# Patient Record
Sex: Female | Born: 1945 | Race: Black or African American | Hispanic: No | Marital: Married | Smoking: Never smoker
Health system: Southern US, Community
[De-identification: ages and names within clinical notes are randomized; demographics above are authoritative.]

## PROBLEM LIST (undated history)

## (undated) ENCOUNTER — Emergency Department (HOSPITAL_COMMUNITY): Payer: Medicare Other | Source: Home / Self Care

## (undated) DIAGNOSIS — G8929 Other chronic pain: Secondary | ICD-10-CM

## (undated) DIAGNOSIS — R519 Headache, unspecified: Secondary | ICD-10-CM

## (undated) DIAGNOSIS — Z9889 Other specified postprocedural states: Secondary | ICD-10-CM

## (undated) DIAGNOSIS — T41205A Adverse effect of unspecified general anesthetics, initial encounter: Secondary | ICD-10-CM

## (undated) DIAGNOSIS — C801 Malignant (primary) neoplasm, unspecified: Secondary | ICD-10-CM

## (undated) DIAGNOSIS — M1990 Unspecified osteoarthritis, unspecified site: Secondary | ICD-10-CM

## (undated) DIAGNOSIS — R112 Nausea with vomiting, unspecified: Secondary | ICD-10-CM

## (undated) DIAGNOSIS — N390 Urinary tract infection, site not specified: Secondary | ICD-10-CM

## (undated) DIAGNOSIS — F419 Anxiety disorder, unspecified: Secondary | ICD-10-CM

## (undated) DIAGNOSIS — I10 Essential (primary) hypertension: Secondary | ICD-10-CM

## (undated) DIAGNOSIS — K219 Gastro-esophageal reflux disease without esophagitis: Secondary | ICD-10-CM

## (undated) DIAGNOSIS — Z8371 Family history of colonic polyps: Secondary | ICD-10-CM

## (undated) DIAGNOSIS — M549 Dorsalgia, unspecified: Secondary | ICD-10-CM

## (undated) HISTORY — PX: BREAST LUMPECTOMY: SHX2

## (undated) HISTORY — PX: COLONOSCOPY: SHX174

---

## 1978-11-11 HISTORY — PX: TUBAL LIGATION: SHX77

## 2002-11-11 HISTORY — PX: RETINAL DETACHMENT SURGERY: SHX105

## 2002-11-11 HISTORY — PX: EYE SURGERY: SHX253

## 2003-11-12 HISTORY — PX: OTHER SURGICAL HISTORY: SHX169

## 2003-11-12 HISTORY — PX: MASTECTOMY: SHX3

## 2004-04-11 DIAGNOSIS — C50911 Malignant neoplasm of unspecified site of right female breast: Secondary | ICD-10-CM

## 2004-04-11 HISTORY — DX: Malignant neoplasm of unspecified site of right female breast: C50.911

## 2007-11-12 HISTORY — PX: OTHER SURGICAL HISTORY: SHX169

## 2008-11-16 ENCOUNTER — Ambulatory Visit: Payer: Self-pay | Admitting: Hematology and Oncology

## 2008-11-23 LAB — CANCER ANTIGEN 27.29: CA 27.29: 22 U/mL (ref 0–39)

## 2008-11-23 LAB — COMPREHENSIVE METABOLIC PANEL
ALT: 18 U/L (ref 0–35)
AST: 20 U/L (ref 0–37)
Albumin: 4.4 g/dL (ref 3.5–5.2)
Alkaline Phosphatase: 66 U/L (ref 39–117)
BUN: 18 mg/dL (ref 6–23)
CO2: 26 mEq/L (ref 19–32)
Calcium: 9.7 mg/dL (ref 8.4–10.5)
Chloride: 103 mEq/L (ref 96–112)
Creatinine, Ser: 0.75 mg/dL (ref 0.40–1.20)
Glucose, Bld: 77 mg/dL (ref 70–99)
Potassium: 3.6 mEq/L (ref 3.5–5.3)
Sodium: 138 mEq/L (ref 135–145)
Total Bilirubin: 0.7 mg/dL (ref 0.3–1.2)
Total Protein: 7.4 g/dL (ref 6.0–8.3)

## 2008-11-23 LAB — CBC WITH DIFFERENTIAL/PLATELET
BASO%: 0.4 % (ref 0.0–2.0)
Basophils Absolute: 0 10*3/uL (ref 0.0–0.1)
EOS%: 1.8 % (ref 0.0–7.0)
Eosinophils Absolute: 0.1 10*3/uL (ref 0.0–0.5)
HCT: 44.2 % (ref 34.8–46.6)
HGB: 15.2 g/dL (ref 11.6–15.9)
LYMPH%: 38.4 % (ref 14.0–48.0)
MCH: 32.8 pg (ref 26.0–34.0)
MCHC: 34.4 g/dL (ref 32.0–36.0)
MCV: 95.3 fL (ref 81.0–101.0)
MONO#: 0.4 10*3/uL (ref 0.1–0.9)
MONO%: 7.6 % (ref 0.0–13.0)
NEUT#: 2.4 10*3/uL (ref 1.5–6.5)
NEUT%: 51.8 % (ref 39.6–76.8)
Platelets: 199 10*3/uL (ref 145–400)
RBC: 4.64 10*6/uL (ref 3.70–5.32)
RDW: 13.6 % (ref 11.3–14.5)
WBC: 4.6 10*3/uL (ref 3.9–10.0)
lymph#: 1.8 10*3/uL (ref 0.9–3.3)

## 2008-11-23 LAB — LACTATE DEHYDROGENASE: LDH: 154 U/L (ref 94–250)

## 2008-11-24 LAB — VITAMIN D 25 HYDROXY (VIT D DEFICIENCY, FRACTURES): Vit D, 25-Hydroxy: 37 ng/mL (ref 30–89)

## 2008-11-24 LAB — TSH: TSH: 0.661 u[IU]/mL (ref 0.350–4.500)

## 2008-11-24 LAB — T4, FREE: Free T4: 1.12 ng/dL (ref 0.89–1.80)

## 2008-12-08 ENCOUNTER — Encounter: Admission: RE | Admit: 2008-12-08 | Discharge: 2008-12-08 | Payer: Self-pay | Admitting: Hematology and Oncology

## 2008-12-09 ENCOUNTER — Encounter: Admission: RE | Admit: 2008-12-09 | Discharge: 2008-12-09 | Payer: Self-pay | Admitting: Hematology and Oncology

## 2009-05-11 ENCOUNTER — Ambulatory Visit: Payer: Self-pay | Admitting: Hematology and Oncology

## 2009-05-11 LAB — CBC WITH DIFFERENTIAL/PLATELET
BASO%: 0.5 % (ref 0.0–2.0)
Basophils Absolute: 0 10*3/uL (ref 0.0–0.1)
EOS%: 0.7 % (ref 0.0–7.0)
Eosinophils Absolute: 0 10*3/uL (ref 0.0–0.5)
HCT: 42.7 % (ref 34.8–46.6)
HGB: 14.8 g/dL (ref 11.6–15.9)
LYMPH%: 39.4 % (ref 14.0–49.7)
MCH: 33.1 pg (ref 25.1–34.0)
MCHC: 34.7 g/dL (ref 31.5–36.0)
MCV: 95.5 fL (ref 79.5–101.0)
MONO#: 0.3 10*3/uL (ref 0.1–0.9)
MONO%: 8.5 % (ref 0.0–14.0)
NEUT#: 1.9 10*3/uL (ref 1.5–6.5)
NEUT%: 50.9 % (ref 38.4–76.8)
Platelets: 193 10*3/uL (ref 145–400)
RBC: 4.47 10*6/uL (ref 3.70–5.45)
RDW: 14.3 % (ref 11.2–14.5)
WBC: 3.8 10*3/uL — ABNORMAL LOW (ref 3.9–10.3)
lymph#: 1.5 10*3/uL (ref 0.9–3.3)

## 2009-05-11 LAB — COMPREHENSIVE METABOLIC PANEL
ALT: 18 U/L (ref 0–35)
AST: 22 U/L (ref 0–37)
Albumin: 4.2 g/dL (ref 3.5–5.2)
Alkaline Phosphatase: 45 U/L (ref 39–117)
BUN: 15 mg/dL (ref 6–23)
CO2: 26 mEq/L (ref 19–32)
Calcium: 10.6 mg/dL — ABNORMAL HIGH (ref 8.4–10.5)
Chloride: 102 mEq/L (ref 96–112)
Creatinine, Ser: 0.84 mg/dL (ref 0.40–1.20)
Glucose, Bld: 74 mg/dL (ref 70–99)
Potassium: 4.1 mEq/L (ref 3.5–5.3)
Sodium: 138 mEq/L (ref 135–145)
Total Bilirubin: 0.7 mg/dL (ref 0.3–1.2)
Total Protein: 7.1 g/dL (ref 6.0–8.3)

## 2009-05-11 LAB — LACTATE DEHYDROGENASE: LDH: 152 U/L (ref 94–250)

## 2009-05-11 LAB — CANCER ANTIGEN 27.29: CA 27.29: 22 U/mL (ref 0–39)

## 2009-08-24 ENCOUNTER — Ambulatory Visit (HOSPITAL_COMMUNITY): Admission: RE | Admit: 2009-08-24 | Discharge: 2009-08-24 | Payer: Self-pay | Admitting: Gastroenterology

## 2009-08-24 ENCOUNTER — Encounter (INDEPENDENT_AMBULATORY_CARE_PROVIDER_SITE_OTHER): Payer: Self-pay | Admitting: Gastroenterology

## 2009-11-11 HISTORY — PX: ANKLE SURGERY: SHX546

## 2009-12-07 ENCOUNTER — Ambulatory Visit: Payer: Self-pay | Admitting: Hematology and Oncology

## 2009-12-11 ENCOUNTER — Encounter: Admission: RE | Admit: 2009-12-11 | Discharge: 2009-12-11 | Payer: Self-pay | Admitting: Hematology and Oncology

## 2009-12-11 LAB — COMPREHENSIVE METABOLIC PANEL
ALT: 16 U/L (ref 0–35)
AST: 21 U/L (ref 0–37)
Albumin: 4.3 g/dL (ref 3.5–5.2)
Alkaline Phosphatase: 52 U/L (ref 39–117)
BUN: 13 mg/dL (ref 6–23)
CO2: 27 mEq/L (ref 19–32)
Calcium: 10 mg/dL (ref 8.4–10.5)
Chloride: 105 mEq/L (ref 96–112)
Creatinine, Ser: 0.73 mg/dL (ref 0.40–1.20)
Glucose, Bld: 78 mg/dL (ref 70–99)
Potassium: 4.1 mEq/L (ref 3.5–5.3)
Sodium: 142 mEq/L (ref 135–145)
Total Bilirubin: 0.6 mg/dL (ref 0.3–1.2)
Total Protein: 7.3 g/dL (ref 6.0–8.3)

## 2009-12-11 LAB — CBC WITH DIFFERENTIAL/PLATELET
BASO%: 0.5 % (ref 0.0–2.0)
Basophils Absolute: 0 10*3/uL (ref 0.0–0.1)
EOS%: 0.9 % (ref 0.0–7.0)
Eosinophils Absolute: 0 10*3/uL (ref 0.0–0.5)
HCT: 44.8 % (ref 34.8–46.6)
HGB: 15.4 g/dL (ref 11.6–15.9)
LYMPH%: 30.8 % (ref 14.0–49.7)
MCH: 33.7 pg (ref 25.1–34.0)
MCHC: 34.3 g/dL (ref 31.5–36.0)
MCV: 98.4 fL (ref 79.5–101.0)
MONO#: 0.4 10*3/uL (ref 0.1–0.9)
MONO%: 9 % (ref 0.0–14.0)
NEUT#: 2.6 10*3/uL (ref 1.5–6.5)
NEUT%: 58.8 % (ref 38.4–76.8)
Platelets: 195 10*3/uL (ref 145–400)
RBC: 4.55 10*6/uL (ref 3.70–5.45)
RDW: 14.1 % (ref 11.2–14.5)
WBC: 4.4 10*3/uL (ref 3.9–10.3)
lymph#: 1.3 10*3/uL (ref 0.9–3.3)

## 2009-12-11 LAB — CANCER ANTIGEN 27.29: CA 27.29: 26 U/mL (ref 0–39)

## 2009-12-11 LAB — LACTATE DEHYDROGENASE: LDH: 154 U/L (ref 94–250)

## 2009-12-14 ENCOUNTER — Ambulatory Visit (HOSPITAL_COMMUNITY): Admission: RE | Admit: 2009-12-14 | Discharge: 2009-12-14 | Payer: Self-pay | Admitting: Hematology and Oncology

## 2009-12-22 ENCOUNTER — Encounter: Admission: RE | Admit: 2009-12-22 | Discharge: 2009-12-22 | Payer: Self-pay | Admitting: Hematology and Oncology

## 2010-06-28 ENCOUNTER — Inpatient Hospital Stay (HOSPITAL_COMMUNITY): Admission: EM | Admit: 2010-06-28 | Discharge: 2010-06-30 | Payer: Self-pay | Admitting: Emergency Medicine

## 2010-08-16 ENCOUNTER — Encounter
Admission: RE | Admit: 2010-08-16 | Discharge: 2010-10-31 | Payer: Self-pay | Source: Home / Self Care | Attending: Orthopedic Surgery | Admitting: Orthopedic Surgery

## 2010-08-24 ENCOUNTER — Ambulatory Visit: Payer: Self-pay | Admitting: Hematology and Oncology

## 2010-08-28 LAB — COMPREHENSIVE METABOLIC PANEL
ALT: 13 U/L (ref 0–35)
AST: 18 U/L (ref 0–37)
Albumin: 4.3 g/dL (ref 3.5–5.2)
Alkaline Phosphatase: 79 U/L (ref 39–117)
BUN: 17 mg/dL (ref 6–23)
CO2: 28 mEq/L (ref 19–32)
Calcium: 10 mg/dL (ref 8.4–10.5)
Chloride: 104 mEq/L (ref 96–112)
Creatinine, Ser: 0.72 mg/dL (ref 0.40–1.20)
Glucose, Bld: 70 mg/dL (ref 70–99)
Potassium: 4.5 mEq/L (ref 3.5–5.3)
Sodium: 140 mEq/L (ref 135–145)
Total Bilirubin: 0.4 mg/dL (ref 0.3–1.2)
Total Protein: 7.5 g/dL (ref 6.0–8.3)

## 2010-08-28 LAB — CBC WITH DIFFERENTIAL/PLATELET
BASO%: 0.6 % (ref 0.0–2.0)
Basophils Absolute: 0 10*3/uL (ref 0.0–0.1)
EOS%: 1.2 % (ref 0.0–7.0)
Eosinophils Absolute: 0.1 10*3/uL (ref 0.0–0.5)
HCT: 42.9 % (ref 34.8–46.6)
HGB: 14.6 g/dL (ref 11.6–15.9)
LYMPH%: 35.1 % (ref 14.0–49.7)
MCH: 33.6 pg (ref 25.1–34.0)
MCHC: 34.1 g/dL (ref 31.5–36.0)
MCV: 98.4 fL (ref 79.5–101.0)
MONO#: 0.5 10*3/uL (ref 0.1–0.9)
MONO%: 8.4 % (ref 0.0–14.0)
NEUT#: 3.1 10*3/uL (ref 1.5–6.5)
NEUT%: 54.7 % (ref 38.4–76.8)
Platelets: 270 10*3/uL (ref 145–400)
RBC: 4.36 10*6/uL (ref 3.70–5.45)
RDW: 14.6 % — ABNORMAL HIGH (ref 11.2–14.5)
WBC: 5.6 10*3/uL (ref 3.9–10.3)
lymph#: 2 10*3/uL (ref 0.9–3.3)

## 2010-08-28 LAB — CANCER ANTIGEN 27.29: CA 27.29: 29 U/mL (ref 0–39)

## 2010-08-28 LAB — LACTATE DEHYDROGENASE: LDH: 135 U/L (ref 94–250)

## 2010-12-01 ENCOUNTER — Other Ambulatory Visit: Payer: Self-pay | Admitting: Hematology and Oncology

## 2010-12-01 DIAGNOSIS — Z780 Asymptomatic menopausal state: Secondary | ICD-10-CM

## 2010-12-01 DIAGNOSIS — Z1239 Encounter for other screening for malignant neoplasm of breast: Secondary | ICD-10-CM

## 2010-12-01 DIAGNOSIS — Z853 Personal history of malignant neoplasm of breast: Secondary | ICD-10-CM

## 2010-12-25 ENCOUNTER — Ambulatory Visit
Admission: RE | Admit: 2010-12-25 | Discharge: 2010-12-25 | Disposition: A | Discharge status: Home / Self Care | Payer: PRIVATE HEALTH INSURANCE | Source: Ambulatory Visit | Attending: Hematology and Oncology | Admitting: Hematology and Oncology

## 2010-12-25 ENCOUNTER — Ambulatory Visit
Admission: RE | Admit: 2010-12-25 | Discharge: 2010-12-25 | Disposition: A | Discharge status: Home / Self Care | Payer: PRIVATE HEALTH INSURANCE | Source: Ambulatory Visit

## 2010-12-25 DIAGNOSIS — Z1239 Encounter for other screening for malignant neoplasm of breast: Secondary | ICD-10-CM

## 2010-12-25 DIAGNOSIS — Z780 Asymptomatic menopausal state: Secondary | ICD-10-CM

## 2010-12-25 DIAGNOSIS — Z853 Personal history of malignant neoplasm of breast: Secondary | ICD-10-CM

## 2011-01-25 LAB — CBC
HCT: 43.4 % (ref 36.0–46.0)
Hemoglobin: 15 g/dL (ref 12.0–15.0)
MCH: 32.7 pg (ref 26.0–34.0)
MCHC: 34.6 g/dL (ref 30.0–36.0)
MCV: 94.6 fL (ref 78.0–100.0)
Platelets: 187 10*3/uL (ref 150–400)
RBC: 4.59 MIL/uL (ref 3.87–5.11)
RDW: 13.8 % (ref 11.5–15.5)
WBC: 9.5 10*3/uL (ref 4.0–10.5)

## 2011-01-25 LAB — COMPREHENSIVE METABOLIC PANEL
ALT: 22 U/L (ref 0–35)
AST: 28 U/L (ref 0–37)
Albumin: 3.7 g/dL (ref 3.5–5.2)
Alkaline Phosphatase: 56 U/L (ref 39–117)
BUN: 14 mg/dL (ref 6–23)
CO2: 27 mEq/L (ref 19–32)
Calcium: 9.3 mg/dL (ref 8.4–10.5)
Chloride: 107 mEq/L (ref 96–112)
Creatinine, Ser: 0.72 mg/dL (ref 0.4–1.2)
GFR calc Af Amer: >60 mL/min (ref >60)
GFR calc non Af Amer: >60 mL/min (ref >60)
Glucose, Bld: 116 mg/dL — ABNORMAL HIGH (ref 70–99)
Potassium: 3.9 mEq/L (ref 3.5–5.1)
Sodium: 138 mEq/L (ref 135–145)
Total Bilirubin: 0.8 mg/dL (ref 0.3–1.2)
Total Protein: 7.1 g/dL (ref 6.0–8.3)

## 2011-01-25 LAB — DIFFERENTIAL
Basophils Absolute: 0 10*3/uL (ref 0.0–0.1)
Basophils Relative: 0 % (ref 0–1)
Eosinophils Absolute: 0 10*3/uL (ref 0.0–0.7)
Eosinophils Relative: 0 % (ref 0–5)
Lymphocytes Relative: 14 % (ref 12–46)
Lymphs Abs: 1.3 10*3/uL (ref 0.7–4.0)
Monocytes Absolute: 0.6 10*3/uL (ref 0.1–1.0)
Monocytes Relative: 7 % (ref 3–12)
Neutro Abs: 7.5 10*3/uL (ref 1.7–7.7)
Neutrophils Relative %: 79 % — ABNORMAL HIGH (ref 43–77)

## 2011-01-25 LAB — APTT: aPTT: 22 seconds — ABNORMAL LOW (ref 24–37)

## 2011-01-25 LAB — PROTIME-INR
INR: 0.98 (ref 0.00–1.49)
Prothrombin Time: 13.2 seconds (ref 11.6–15.2)

## 2011-02-25 ENCOUNTER — Other Ambulatory Visit: Payer: Self-pay | Admitting: *Deleted

## 2011-02-25 DIAGNOSIS — M5416 Radiculopathy, lumbar region: Secondary | ICD-10-CM

## 2011-02-26 ENCOUNTER — Ambulatory Visit
Admission: RE | Admit: 2011-02-26 | Discharge: 2011-02-26 | Disposition: A | Discharge status: Home / Self Care | Payer: PRIVATE HEALTH INSURANCE | Source: Ambulatory Visit | Attending: *Deleted | Admitting: *Deleted

## 2011-02-26 DIAGNOSIS — M5416 Radiculopathy, lumbar region: Secondary | ICD-10-CM

## 2011-02-26 MED ORDER — GADOBENATE DIMEGLUMINE 529 MG/ML IV SOLN
13.0000 mL | Freq: Once | INTRAVENOUS | Status: AC | PRN
  Administered 2011-02-26: 13 mL via INTRAVENOUS

## 2011-03-14 ENCOUNTER — Other Ambulatory Visit: Payer: Self-pay | Admitting: Neurosurgery

## 2011-03-14 DIAGNOSIS — M7130 Other bursal cyst, unspecified site: Secondary | ICD-10-CM

## 2011-03-18 ENCOUNTER — Ambulatory Visit
Admission: RE | Admit: 2011-03-18 | Discharge: 2011-03-18 | Disposition: A | Discharge status: Home / Self Care | Payer: PRIVATE HEALTH INSURANCE | Source: Ambulatory Visit | Attending: Neurosurgery | Admitting: Neurosurgery

## 2011-03-18 ENCOUNTER — Other Ambulatory Visit: Payer: PRIVATE HEALTH INSURANCE

## 2011-03-18 DIAGNOSIS — M7130 Other bursal cyst, unspecified site: Secondary | ICD-10-CM

## 2011-04-23 ENCOUNTER — Ambulatory Visit: Payer: PRIVATE HEALTH INSURANCE

## 2011-04-25 ENCOUNTER — Other Ambulatory Visit: Payer: Self-pay | Admitting: Hematology and Oncology

## 2011-04-25 DIAGNOSIS — Z780 Asymptomatic menopausal state: Secondary | ICD-10-CM

## 2011-04-25 DIAGNOSIS — Z853 Personal history of malignant neoplasm of breast: Secondary | ICD-10-CM

## 2011-04-29 ENCOUNTER — Ambulatory Visit: Payer: PRIVATE HEALTH INSURANCE | Attending: Internal Medicine

## 2011-04-29 DIAGNOSIS — IMO0001 Reserved for inherently not codable concepts without codable children: Secondary | ICD-10-CM | POA: Insufficient documentation

## 2011-04-29 DIAGNOSIS — M545 Low back pain, unspecified: Secondary | ICD-10-CM | POA: Insufficient documentation

## 2011-04-29 DIAGNOSIS — R5381 Other malaise: Secondary | ICD-10-CM | POA: Insufficient documentation

## 2011-04-29 DIAGNOSIS — M79609 Pain in unspecified limb: Secondary | ICD-10-CM | POA: Insufficient documentation

## 2011-05-03 ENCOUNTER — Ambulatory Visit: Payer: PRIVATE HEALTH INSURANCE

## 2011-05-06 ENCOUNTER — Ambulatory Visit: Payer: PRIVATE HEALTH INSURANCE | Admitting: Physical Therapy

## 2011-05-08 ENCOUNTER — Other Ambulatory Visit: Payer: Self-pay | Admitting: Neurosurgery

## 2011-05-08 ENCOUNTER — Ambulatory Visit: Payer: PRIVATE HEALTH INSURANCE | Admitting: Physical Therapy

## 2011-05-08 DIAGNOSIS — M7130 Other bursal cyst, unspecified site: Secondary | ICD-10-CM

## 2011-05-09 ENCOUNTER — Ambulatory Visit
Admission: RE | Admit: 2011-05-09 | Discharge: 2011-05-09 | Disposition: A | Discharge status: Home / Self Care | Payer: PRIVATE HEALTH INSURANCE | Source: Ambulatory Visit | Attending: Neurosurgery | Admitting: Neurosurgery

## 2011-05-09 ENCOUNTER — Other Ambulatory Visit: Payer: PRIVATE HEALTH INSURANCE

## 2011-05-09 DIAGNOSIS — M7130 Other bursal cyst, unspecified site: Secondary | ICD-10-CM

## 2011-05-21 ENCOUNTER — Ambulatory Visit: Payer: PRIVATE HEALTH INSURANCE | Attending: Neurosurgery | Admitting: Physical Therapy

## 2011-05-21 DIAGNOSIS — IMO0001 Reserved for inherently not codable concepts without codable children: Secondary | ICD-10-CM | POA: Insufficient documentation

## 2011-05-21 DIAGNOSIS — M79609 Pain in unspecified limb: Secondary | ICD-10-CM | POA: Insufficient documentation

## 2011-05-21 DIAGNOSIS — M545 Low back pain, unspecified: Secondary | ICD-10-CM | POA: Insufficient documentation

## 2011-05-21 DIAGNOSIS — R5381 Other malaise: Secondary | ICD-10-CM | POA: Insufficient documentation

## 2011-05-23 ENCOUNTER — Ambulatory Visit: Payer: PRIVATE HEALTH INSURANCE

## 2011-05-27 ENCOUNTER — Ambulatory Visit (HOSPITAL_COMMUNITY)
Admission: RE | Admit: 2011-05-27 | Discharge: 2011-05-27 | Disposition: A | Discharge status: Home / Self Care | Payer: PRIVATE HEALTH INSURANCE | Source: Ambulatory Visit | Attending: Hematology and Oncology | Admitting: Hematology and Oncology

## 2011-05-27 ENCOUNTER — Other Ambulatory Visit: Payer: Self-pay | Admitting: Hematology and Oncology

## 2011-05-27 ENCOUNTER — Encounter (HOSPITAL_BASED_OUTPATIENT_CLINIC_OR_DEPARTMENT_OTHER): Payer: PRIVATE HEALTH INSURANCE | Admitting: Hematology and Oncology

## 2011-05-27 DIAGNOSIS — C50919 Malignant neoplasm of unspecified site of unspecified female breast: Secondary | ICD-10-CM

## 2011-05-27 LAB — COMPREHENSIVE METABOLIC PANEL
ALT: 17 U/L (ref 0–35)
AST: 20 U/L (ref 0–37)
Albumin: 4.3 g/dL (ref 3.5–5.2)
Alkaline Phosphatase: 56 U/L (ref 39–117)
BUN: 16 mg/dL (ref 6–23)
CO2: 26 mEq/L (ref 19–32)
Calcium: 10.5 mg/dL (ref 8.4–10.5)
Chloride: 104 mEq/L (ref 96–112)
Creatinine, Ser: 0.69 mg/dL (ref 0.50–1.10)
Glucose, Bld: 78 mg/dL (ref 70–99)
Potassium: 4.1 mEq/L (ref 3.5–5.3)
Sodium: 141 mEq/L (ref 135–145)
Total Bilirubin: 0.6 mg/dL (ref 0.3–1.2)
Total Protein: 7 g/dL (ref 6.0–8.3)

## 2011-05-27 LAB — CBC WITH DIFFERENTIAL/PLATELET
BASO%: 0.7 % (ref 0.0–2.0)
Basophils Absolute: 0 10*3/uL (ref 0.0–0.1)
EOS%: 1.1 % (ref 0.0–7.0)
Eosinophils Absolute: 0.1 10*3/uL (ref 0.0–0.5)
HCT: 42 % (ref 34.8–46.6)
HGB: 14.4 g/dL (ref 11.6–15.9)
LYMPH%: 30 % (ref 14.0–49.7)
MCH: 33.6 pg (ref 25.1–34.0)
MCHC: 34.2 g/dL (ref 31.5–36.0)
MCV: 98.4 fL (ref 79.5–101.0)
MONO#: 0.5 10*3/uL (ref 0.1–0.9)
MONO%: 10 % (ref 0.0–14.0)
NEUT#: 2.8 10*3/uL (ref 1.5–6.5)
NEUT%: 58.2 % (ref 38.4–76.8)
Platelets: 199 10*3/uL (ref 145–400)
RBC: 4.27 10*6/uL (ref 3.70–5.45)
RDW: 14.6 % — ABNORMAL HIGH (ref 11.2–14.5)
WBC: 4.9 10*3/uL (ref 3.9–10.3)
lymph#: 1.5 10*3/uL (ref 0.9–3.3)

## 2011-05-27 LAB — CANCER ANTIGEN 27.29: CA 27.29: 30 U/mL (ref 0–39)

## 2011-05-27 LAB — LACTATE DEHYDROGENASE: LDH: 151 U/L (ref 94–250)

## 2011-05-28 ENCOUNTER — Ambulatory Visit: Payer: PRIVATE HEALTH INSURANCE

## 2011-05-29 ENCOUNTER — Other Ambulatory Visit: Payer: Self-pay | Admitting: Hematology and Oncology

## 2011-05-29 ENCOUNTER — Encounter (HOSPITAL_BASED_OUTPATIENT_CLINIC_OR_DEPARTMENT_OTHER): Payer: PRIVATE HEALTH INSURANCE | Admitting: Hematology and Oncology

## 2011-05-29 DIAGNOSIS — Z853 Personal history of malignant neoplasm of breast: Secondary | ICD-10-CM

## 2011-05-29 DIAGNOSIS — C50419 Malignant neoplasm of upper-outer quadrant of unspecified female breast: Secondary | ICD-10-CM

## 2011-05-30 ENCOUNTER — Ambulatory Visit: Payer: PRIVATE HEALTH INSURANCE | Admitting: Physical Therapy

## 2011-06-04 ENCOUNTER — Ambulatory Visit: Payer: PRIVATE HEALTH INSURANCE

## 2011-06-07 ENCOUNTER — Ambulatory Visit: Payer: PRIVATE HEALTH INSURANCE

## 2011-06-12 ENCOUNTER — Ambulatory Visit: Payer: PRIVATE HEALTH INSURANCE

## 2011-06-28 ENCOUNTER — Other Ambulatory Visit: Payer: Self-pay | Admitting: Neurosurgery

## 2011-06-28 DIAGNOSIS — M7130 Other bursal cyst, unspecified site: Secondary | ICD-10-CM

## 2011-07-03 ENCOUNTER — Ambulatory Visit
Admission: RE | Admit: 2011-07-03 | Discharge: 2011-07-03 | Disposition: A | Discharge status: Home / Self Care | Payer: PRIVATE HEALTH INSURANCE | Source: Ambulatory Visit | Attending: Neurosurgery | Admitting: Neurosurgery

## 2011-07-03 DIAGNOSIS — M7130 Other bursal cyst, unspecified site: Secondary | ICD-10-CM

## 2011-07-03 MED ORDER — IOHEXOL 180 MG/ML  SOLN
1.0000 mL | Freq: Once | INTRAMUSCULAR | Status: AC | PRN
  Administered 2011-07-03: 1 mL via EPIDURAL

## 2011-07-03 MED ORDER — METHYLPREDNISOLONE ACETATE 40 MG/ML INJ SUSP (RADIOLOG
120.0000 mg | Freq: Once | INTRAMUSCULAR | Status: AC
  Administered 2011-07-03: 120 mg via EPIDURAL

## 2011-07-03 NOTE — Progress Notes (Signed)
Pt taking po's well, pt in good spirits

## 2011-09-30 ENCOUNTER — Encounter (HOSPITAL_COMMUNITY): Payer: Self-pay | Admitting: Pharmacy Technician

## 2011-10-01 NOTE — H&P (Signed)
Barbara Rangel 10/01/2011 2:17 PM Location: [facility] Patient #: [id number] DOB: [birth year] Married / Language: Undefined / Race: Undefined Female   History of Present Illness(Nocole Zammit Barbara Highman, PA-C; 10/01/2011 5:49 PM) The patient is a 65 year old female who comes in today for a preoperative History and Physical. The patient is scheduled for a Gill decompression L4-5 (for right radicular leg pain) to be performed by Dr. Debria Garret D. Shon Baton, MD at [facility] on Wednesday, October 09, 2011 at 1:30PM .    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 07/17/2010   Allergies(Bora Bost Barbara Dali Kraner, PA-C; 10/01/2011 2:26 PM) Citalopram Hydrobromide *ANTIDEPRESSANTS* Trimethoprim-Sulfamethoxazole *Anti-infective Agents - Misc. Keflex *CEPHALOSPORINS* Avelox *FLUOROQUINOLONES*   Family History(Barbara Rangel Barbara Leinani Lisbon, PA-C; 10/01/2011 2:26 PM) Drug / Alcohol Addiction. child Diabetes Mellitus. grandfather mothers side Osteoarthritis. father Hypertension. father, brother and grandfather mothers side Cancer. mother and grandmother mothers side Depression. child Cerebrovascular Accident. grandfather mothers side   Social History(Yara Tomkinson Barbara Highman, PA-C; 10/01/2011 2:26 PM) Drug/Alcohol Rehab (Currently). no Current work status. retired Illicit drug use. no Exercise. Exercises weekly; does running / walking Alcohol use. never consumed alcohol Copy of Drug/Alcohol Rehab (Previously). no Children. 4 Living situation. live with spouse Number of flights of stairs before winded. 4-5 Marital status. married Tobacco use. Never smoker. never smoker Pain Contract. no   Medication History(Madi Bonfiglio Barbara Faiga Stones, PA-C; 10/01/2011 3:45 PM) Norco (7.5-325MG  Tablet, 1 Oral qid) Active. Norvasc (5MG  Tablet, 1  Oral daily) Active. Nitrofurantoin Macrocrystal (100MG  Capsule, 1 Oral daily) Active. Anastrozole (1MG  Tablet, 1 Oral daily) Active. Fosamax (70MG  Tablet, 1 Oral weekly) Active. Latanoprost (0.005% Solution, 1 drop each eye Ophthalmic 1 daily) Active. CeleBREX (200MG  Capsule, 1 Oral daily) Active. Dulcolax Stool Softener ( Oral) Specific dose unknown - Active. Chlorpheniramine Maleate (4MG  Tablet, 1 Oral daily) Active. Citracal Plus (2 Oral daily) Active. Multivitamin (1 Oral daily) Active.   Pregnancy / Birth History(Barbara Rangel Barbara [facility], PA-C; 10/01/2011 2:26 PM) Pregnant. no   Past Surgical History(Barbara Rangel Barbara [facility], PA-C; 10/01/2011 2:26 PM) Breast Biopsy. right Ankle Surgery. left Cataract Surgery. right Breast Mass; Local Excision. right Mastectomy. right Tubal Ligation   Other Problems(Barbara Rangel Barbara Stormi Vandevelde, PA-C; 10/01/2011 2:26 PM) High blood pressure Breast Cancer   Review of Systems(Barbara Rangel Barbara [facility], PA-C; 10/01/2011 2:26 PM) General:Not Present- Chills, Fever, Night Sweats, Appetite Loss, Fatigue, Feeling sick, Weight Gain and Weight Loss. Skin:Not Present- Itching, Rash, Skin Color Changes, Ulcer, Psoriasis and Change in Hair or Nails. HEENT:Present- Sensitivity to light. Not Present- Hearing problems, Nose Bleed and Ringing in the Ears. Neck:Not Present- Swollen Glands and Neck Mass. Respiratory:Not Present- Snoring, Chronic Cough, Bloody sputum and Dyspnea. Cardiovascular:Not Present- Shortness of Breath, Chest Pain, Swelling of Extremities, Leg Cramps and Palpitations. Gastrointestinal:Not Present- Bloody Stool, Heartburn, Abdominal Pain, Vomiting, Nausea and Incontinence of Stool. Musculoskeletal:Present- Back Pain. Not Present- Muscle Weakness, Muscle Pain, Joint Stiffness, Joint Swelling and Joint Pain. Neurological:Present- Tingling and Numbness. Not Present- Burning, Tremor, Headaches and Dizziness. Psychiatric:Not Present- Anxiety, Depression and  Memory Loss. Endocrine:Not Present- Cold Intolerance, Heat Intolerance, Excessive hunger and Excessive Thirst. Hematology:Not Present- Abnormal Bleeding, Anemia, Blood Clots and Easy Bruising.   Vitals(Barbara Rangel; 10/01/2011 2:19 PM) 10/01/2011 2:18 PM Weight: 140 lb Height: 65 in Body Surface Area: 1.71 m Body Mass Index: 23.3 kg/m Pulse: 92 (Regular) BP: 179/81 (Sitting, Left Arm, Standard)    Physical Exam(Barbara Rangel Barbara Geneva Pallas,  PA-C; 10/01/2011 5:47 PM) The physical exam findings are as follows:   General General Appearance- pleasant. Not in acute distress. Orientation- Oriented X3. Build & Nutrition- Well nourished and Well developed. Posture- Normal posture. Gait- Normal. Mental Status- Alert.   Integumentary Lumbar Spine- Skin examination of the lumbar spine is without deformity, skin lesions, lacerations or abrasions.   Head and Neck Neck Global Assessment- supple. no lymphadenopathy and no nucchal rigidty.   Eye Pupil- Bilateral- Normal, Direct reaction to light normal, Equal and Regular. Motion- Bilateral- EOMI.   Chest and Lung Exam Auscultation: Breath sounds:- Clear.   Cardiovascular Auscultation:Rhythm- Regular rate and rhythm. Heart Sounds- Normal heart sounds.   Abdomen Palpation/Percussion:Palpation and Percussion of the abdomen reveal - Non Tender, No Rebound tenderness and Soft.   Peripheral Vascular Lower Extremity:Inspection- Bilateral- Inspection Normal. Palpation:Posterior tibial pulse- Bilateral- 2+. Dorsalis pedis pulse- Bilateral- 2+.   Neurologic Sensation:Lower Extremity- Left- sensation is intact in the lower extremity. Right- sensation is diminished in the lower extremity (L5 distribution). Reflexes:Patellar Reflex- Bilateral- 1+. Achilles Reflex- Bilateral- 1+. Babinski- Bilateral- Babinski not present. Clonus- Bilateral- clonus not present. Hoffman's Sign-  Bilateral- Hoffman's sign not present. Testing:Seated Straight Leg Raise- Bilateral- Seated straight leg raise negative.   Musculoskeletal Spine/Ribs/Pelvis Lumbosacral Spine:Inspection and Palpation- Tenderness- localized and right gluteal musculature tender to palpation. bony and soft tissue palpation of the lumbar spine and SI joint does not recreate their typical pain. Strength and Tone: Strength:Hip Flexion- Bilateral- 5/5. Knee Extension- Bilateral- 5/5. Knee Flexion- Bilateral- 5/5. Ankle Dorsiflexion- Bilateral- 5/5. Ankle Plantarflexion- Bilateral- 5/5. Heel walk- Bilateral- able to heel walk without difficulty. Toe Walk- Bilateral- able to walk on toes without difficulty. Heel-Toe Walk- Bilateral- able to heel-toe walk without difficulty. ROM- Flexion- full range of motion. Extension- full range of motion. Pain:- neither flexion or extension is more painful than the other. Waddell's Signs- no Waddell's signs present. Lower Extremity Range of Motion:- No true hip, knee or ankle pain with range of motion. Gait and Station:Assistive Devices- no assistive devices.   Assessment & Plan(Tauna Macfarlane Barbara 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   MRI dated 02/26/11 demonstrates spondylosis most notable at L4-5 where a synovial cyst of the right facet results in encroachment on the descending left L5 root. Mild anterolisthesis of L4 on L5 and moderate central canal stenosis.  She has been medically cleared for surgery by Dr. Theressa Millard with [facility] with no specific recommendations. She is scheduled to complete her pre-op  hospital requirements tomorrow at 9:30AM. She has not yet been fitted for a corsett brace. Therapy most likely will be contacting her next week.   Risks/benefits/alternatives to surgery and expectations following surgery have been reviewed with the patient by Dr. Shon Baton. All of her questions were encouraged, addressed and answered. At this time, the patient wishes to proceed with surgery as scheduled.   Signed electronically by Gwinda Maine, PA-C (10/01/2011 5:49 PM)

## 2011-10-02 ENCOUNTER — Encounter (HOSPITAL_COMMUNITY)
Admission: RE | Admit: 2011-10-02 | Discharge: 2011-10-02 | Disposition: A | Discharge status: Home / Self Care | Payer: PRIVATE HEALTH INSURANCE | Source: Ambulatory Visit | Attending: Physician Assistant | Admitting: Physician Assistant

## 2011-10-02 ENCOUNTER — Encounter (HOSPITAL_COMMUNITY): Payer: Self-pay

## 2011-10-02 ENCOUNTER — Other Ambulatory Visit: Payer: Self-pay

## 2011-10-02 ENCOUNTER — Encounter (HOSPITAL_COMMUNITY)
Admission: RE | Admit: 2011-10-02 | Discharge: 2011-10-02 | Disposition: A | Discharge status: Home / Self Care | Payer: PRIVATE HEALTH INSURANCE | Source: Ambulatory Visit | Attending: Orthopedic Surgery | Admitting: Orthopedic Surgery

## 2011-10-02 DIAGNOSIS — Z01812 Encounter for preprocedural laboratory examination: Secondary | ICD-10-CM | POA: Insufficient documentation

## 2011-10-02 DIAGNOSIS — Z01818 Encounter for other preprocedural examination: Secondary | ICD-10-CM | POA: Insufficient documentation

## 2011-10-02 DIAGNOSIS — Z01810 Encounter for preprocedural cardiovascular examination: Secondary | ICD-10-CM | POA: Insufficient documentation

## 2011-10-02 HISTORY — DX: Other specified postprocedural states: Z98.890

## 2011-10-02 HISTORY — DX: Other chronic pain: G89.29

## 2011-10-02 HISTORY — DX: Family history of colonic polyps: Z83.71

## 2011-10-02 HISTORY — DX: Urinary tract infection, site not specified: N39.0

## 2011-10-02 HISTORY — DX: Malignant (primary) neoplasm, unspecified: C80.1

## 2011-10-02 HISTORY — DX: Dorsalgia, unspecified: M54.9

## 2011-10-02 HISTORY — DX: Nausea with vomiting, unspecified: R11.2

## 2011-10-02 HISTORY — DX: Essential (primary) hypertension: I10

## 2011-10-02 HISTORY — DX: Unspecified osteoarthritis, unspecified site: M19.90

## 2011-10-02 HISTORY — DX: Other specified postprocedural states: R11.2

## 2011-10-02 HISTORY — DX: Adverse effect of unspecified general anesthetics, initial encounter: T41.205A

## 2011-10-02 LAB — DIFFERENTIAL
Basophils Absolute: 0 10*3/uL (ref 0.0–0.1)
Basophils Relative: 0 % (ref 0–1)
Eosinophils Absolute: 0.1 10*3/uL (ref 0.0–0.7)
Eosinophils Relative: 1 % (ref 0–5)
Lymphocytes Relative: 31 % (ref 12–46)
Lymphs Abs: 1.8 10*3/uL (ref 0.7–4.0)
Monocytes Absolute: 0.5 10*3/uL (ref 0.1–1.0)
Monocytes Relative: 9 % (ref 3–12)
Neutro Abs: 3.4 10*3/uL (ref 1.7–7.7)
Neutrophils Relative %: 59 % (ref 43–77)

## 2011-10-02 LAB — CBC
HCT: 43.5 % (ref 36.0–46.0)
Hemoglobin: 14.9 g/dL (ref 12.0–15.0)
MCH: 33.1 pg (ref 26.0–34.0)
MCHC: 34.3 g/dL (ref 30.0–36.0)
MCV: 96.7 fL (ref 78.0–100.0)
Platelets: 198 10*3/uL (ref 150–400)
RBC: 4.5 MIL/uL (ref 3.87–5.11)
RDW: 13.4 % (ref 11.5–15.5)
WBC: 5.7 10*3/uL (ref 4.0–10.5)

## 2011-10-02 LAB — COMPREHENSIVE METABOLIC PANEL
ALT: 19 U/L (ref 0–35)
AST: 25 U/L (ref 0–37)
Albumin: 4.5 g/dL (ref 3.5–5.2)
Alkaline Phosphatase: 65 U/L (ref 39–117)
BUN: 16 mg/dL (ref 6–23)
CO2: 30 mEq/L (ref 19–32)
Calcium: 10.4 mg/dL (ref 8.4–10.5)
Chloride: 102 mEq/L (ref 96–112)
Creatinine, Ser: 0.64 mg/dL (ref 0.50–1.10)
GFR calc Af Amer: >90 mL/min (ref >90)
GFR calc non Af Amer: >90 mL/min (ref >90)
Glucose, Bld: 84 mg/dL (ref 70–99)
Potassium: 3.6 mEq/L (ref 3.5–5.1)
Sodium: 141 mEq/L (ref 135–145)
Total Bilirubin: 0.6 mg/dL (ref 0.3–1.2)
Total Protein: 7.5 g/dL (ref 6.0–8.3)

## 2011-10-02 LAB — SURGICAL PCR SCREEN
MRSA, PCR: NEGATIVE
Staphylococcus aureus: NEGATIVE

## 2011-10-02 LAB — PROTIME-INR
INR: 0.95 (ref 0.00–1.49)
Prothrombin Time: 12.9 seconds (ref 11.6–15.2)

## 2011-10-02 NOTE — Progress Notes (Signed)
Pt doesn't have a cardiologist;maintained by medical MD for HTN Stress test/echo done 7+yrs ago

## 2011-10-02 NOTE — Pre-Procedure Instructions (Signed)
20 LOYALTY ARENTZ  10/02/2011   Your procedure is scheduled on:  Wed,Nov 28 @ 1:30 Report to [facility] at 1130 AM.  Call this number if you have problems the morning of surgery: [phone]   Remember:   Do not eat food:After Midnight.  Do not drink clear liquids: 4 Hours before arrival.May have soda,tea,black coffee,apple/grape juice,broth,or water until 7:30am  Take these medicines the morning of surgery with A SIP OF WATER: Amlodipine,Arimidex,Pain Pill(if needed)   Do not wear jewelry, make-up or nail polish.  Do not wear lotions, powders, or perfumes. You may wear deodorant.  Do not shave 48 hours prior to surgery.  Do not bring valuables to the hospital.  Contacts, dentures or bridgework may not be worn into surgery.  Leave suitcase in the car. After surgery it may be brought to your room.  For patients admitted to the hospital, checkout time is 11:00 AM the day of discharge.   Patients discharged the day of surgery will not be allowed to drive home.  Name and phone number of your driver:   Special Instructions: CHG Shower Use Special Wash: 1/2 bottle night before surgery and 1/2 bottle morning of surgery.   Please read over the following fact sheets that you were given: Pain Booklet, Coughing and Deep Breathing, MRSA Information and Surgical Site Infection Prevention

## 2011-10-09 ENCOUNTER — Encounter (HOSPITAL_COMMUNITY): Admission: RE | Payer: Self-pay | Source: Ambulatory Visit

## 2011-10-09 ENCOUNTER — Inpatient Hospital Stay (HOSPITAL_COMMUNITY)
Admission: RE | Admit: 2011-10-09 | Payer: PRIVATE HEALTH INSURANCE | Source: Ambulatory Visit | Admitting: Orthopedic Surgery

## 2011-10-09 SURGERY — LUMBAR LAMINECTOMY/DECOMPRESSION MICRODISCECTOMY
Anesthesia: General | Laterality: Right

## 2011-10-21 ENCOUNTER — Encounter (HOSPITAL_COMMUNITY): Payer: Self-pay | Admitting: Pharmacy Technician

## 2011-10-29 ENCOUNTER — Encounter (HOSPITAL_COMMUNITY): Payer: Self-pay

## 2011-10-29 ENCOUNTER — Encounter (HOSPITAL_COMMUNITY)
Admission: RE | Admit: 2011-10-29 | Discharge: 2011-10-29 | Disposition: A | Discharge status: Home / Self Care | Payer: PRIVATE HEALTH INSURANCE | Source: Ambulatory Visit | Attending: Orthopedic Surgery | Admitting: Orthopedic Surgery

## 2011-10-29 LAB — CBC
HCT: 44.2 % (ref 36.0–46.0)
Hemoglobin: 15.3 g/dL — ABNORMAL HIGH (ref 12.0–15.0)
MCH: 33.4 pg (ref 26.0–34.0)
MCHC: 34.6 g/dL (ref 30.0–36.0)
MCV: 96.5 fL (ref 78.0–100.0)
Platelets: 211 10*3/uL (ref 150–400)
RBC: 4.58 MIL/uL (ref 3.87–5.11)
RDW: 13.3 % (ref 11.5–15.5)
WBC: 6.2 10*3/uL (ref 4.0–10.5)

## 2011-10-29 LAB — BASIC METABOLIC PANEL
BUN: 12 mg/dL (ref 6–23)
CO2: 28 mEq/L (ref 19–32)
Calcium: 10.7 mg/dL — ABNORMAL HIGH (ref 8.4–10.5)
Chloride: 103 mEq/L (ref 96–112)
Creatinine, Ser: 0.62 mg/dL (ref 0.50–1.10)
GFR calc Af Amer: >90 mL/min (ref >90)
GFR calc non Af Amer: >90 mL/min (ref >90)
Glucose, Bld: 93 mg/dL (ref 70–99)
Potassium: 4.7 mEq/L (ref 3.5–5.1)
Sodium: 143 mEq/L (ref 135–145)

## 2011-10-29 NOTE — Pre-Procedure Instructions (Signed)
20 Barbara Rangel  10/29/2011   Your procedure is scheduled on:  December 20  Report to [facility] at 5:30 AM.  Call this number if you have problems the morning of surgery: [phone]   Remember:   Do not eat food:After Midnight.  May have clear liquids: up to 4 Hours before arrival.  Clear liquids include soda, tea, black coffee, apple or grape juice, broth.  Take these medicines the morning of surgery with A SIP OF WATER: Norvasc, Arimidex, Chlortrimeton, Norco, macrodantin, eye drops   Do not wear jewelry, make-up or nail polish.  Do not wear lotions, powders, or perfumes. You may wear deodorant.  Do not shave 48 hours prior to surgery.  Do not bring valuables to the hospital.  Contacts, dentures or bridgework may not be worn into surgery.  Leave suitcase in the car. After surgery it may be brought to your room.  For patients admitted to the hospital, checkout time is 11:00 AM the day of discharge.   Patients discharged the day of surgery will not be allowed to drive home.  Name and phone number of your driver: NA  Special Instructions: CHG Shower Use Special Wash: 1/2 bottle night before surgery and 1/2 bottle morning of surgery.   Please read over the following fact sheets that you were given: Pain Booklet, Coughing and Deep Breathing and Surgical Site Infection Prevention

## 2011-10-30 MED ORDER — VANCOMYCIN HCL IN DEXTROSE 1-5 GM/200ML-% IV SOLN
1000.0000 mg | INTRAVENOUS | Status: AC
  Administered 2011-10-31: 1000 mg via INTRAVENOUS
  Filled 2011-10-30: qty 200

## 2011-10-30 MED ORDER — LACTATED RINGERS IV SOLN: INTRAVENOUS | Status: DC

## 2011-10-31 ENCOUNTER — Encounter (HOSPITAL_COMMUNITY)
Admission: RE | Disposition: A | Discharge status: Home / Self Care | Payer: Self-pay | Source: Ambulatory Visit | Attending: Orthopedic Surgery

## 2011-10-31 ENCOUNTER — Encounter (HOSPITAL_COMMUNITY): Payer: Self-pay | Admitting: Anesthesiology

## 2011-10-31 ENCOUNTER — Inpatient Hospital Stay (HOSPITAL_COMMUNITY)
Admission: RE | Admit: 2011-10-31 | Discharge: 2011-11-01 | DRG: 491 | Disposition: A | Discharge status: Home / Self Care | Payer: PRIVATE HEALTH INSURANCE | Source: Ambulatory Visit | Attending: Orthopedic Surgery | Admitting: Orthopedic Surgery

## 2011-10-31 ENCOUNTER — Ambulatory Visit (HOSPITAL_COMMUNITY): Payer: PRIVATE HEALTH INSURANCE | Admitting: Anesthesiology

## 2011-10-31 ENCOUNTER — Ambulatory Visit (HOSPITAL_COMMUNITY): Payer: PRIVATE HEALTH INSURANCE

## 2011-10-31 ENCOUNTER — Encounter (HOSPITAL_COMMUNITY): Payer: Self-pay | Admitting: *Deleted

## 2011-10-31 DIAGNOSIS — Z87440 Personal history of urinary (tract) infections: Secondary | ICD-10-CM

## 2011-10-31 DIAGNOSIS — Z853 Personal history of malignant neoplasm of breast: Secondary | ICD-10-CM

## 2011-10-31 DIAGNOSIS — I10 Essential (primary) hypertension: Secondary | ICD-10-CM | POA: Diagnosis present

## 2011-10-31 DIAGNOSIS — M7138 Other bursal cyst, other site: Secondary | ICD-10-CM

## 2011-10-31 DIAGNOSIS — M7130 Other bursal cyst, unspecified site: Principal | ICD-10-CM | POA: Diagnosis present

## 2011-10-31 HISTORY — PX: LUMBAR LAMINECTOMY/DECOMPRESSION MICRODISCECTOMY: SHX5026

## 2011-10-31 SURGERY — LUMBAR LAMINECTOMY/DECOMPRESSION MICRODISCECTOMY
Anesthesia: General | Laterality: Right

## 2011-10-31 MED ORDER — FENTANYL CITRATE 0.05 MG/ML IJ SOLN
INTRAMUSCULAR | Status: DC | PRN
  Administered 2011-10-31: 50 ug via INTRAVENOUS
  Administered 2011-10-31: 150 ug via INTRAVENOUS

## 2011-10-31 MED ORDER — ACETAMINOPHEN 10 MG/ML IV SOLN
INTRAVENOUS | Status: AC
  Filled 2011-10-31: qty 100

## 2011-10-31 MED ORDER — LATANOPROST 0.005 % OP SOLN
1.0000 [drp] | Freq: Every day | OPHTHALMIC | Status: DC
  Administered 2011-10-31: 1 [drp] via OPHTHALMIC
  Filled 2011-10-31: qty 2.5

## 2011-10-31 MED ORDER — MORPHINE SULFATE 4 MG/ML IJ SOLN: 1.0000 mg | INTRAMUSCULAR | Status: DC | PRN

## 2011-10-31 MED ORDER — METHOCARBAMOL 100 MG/ML IJ SOLN
500.0000 mg | Freq: Four times a day (QID) | INTRAVENOUS | Status: DC | PRN
  Filled 2011-10-31: qty 5

## 2011-10-31 MED ORDER — LACTATED RINGERS IV SOLN
INTRAVENOUS | Status: DC | PRN
  Administered 2011-10-31 (×2): via INTRAVENOUS

## 2011-10-31 MED ORDER — ROCURONIUM BROMIDE 100 MG/10ML IV SOLN
INTRAVENOUS | Status: DC | PRN
  Administered 2011-10-31: 40 mg via INTRAVENOUS

## 2011-10-31 MED ORDER — ONDANSETRON HCL 4 MG/2ML IJ SOLN: 4.0000 mg | INTRAMUSCULAR | Status: DC | PRN

## 2011-10-31 MED ORDER — LORATADINE 10 MG PO TABS
10.0000 mg | ORAL_TABLET | Freq: Every day | ORAL | Status: DC
Start: 2011-10-31 — End: 2011-11-01
  Administered 2011-10-31 – 2011-11-01 (×2): 10 mg via ORAL
  Filled 2011-10-31 (×3): qty 1

## 2011-10-31 MED ORDER — BUPIVACAINE-EPINEPHRINE 0.25% -1:200000 IJ SOLN
INTRAMUSCULAR | Status: DC | PRN
  Administered 2011-10-31: 10 mL

## 2011-10-31 MED ORDER — DEXAMETHASONE 4 MG PO TABS
4.0000 mg | ORAL_TABLET | Freq: Four times a day (QID) | ORAL | Status: DC
  Administered 2011-10-31 – 2011-11-01 (×5): 4 mg via ORAL
  Filled 2011-10-31 (×8): qty 1

## 2011-10-31 MED ORDER — PROPOFOL 10 MG/ML IV EMUL
INTRAVENOUS | Status: DC | PRN
  Administered 2011-10-31: 200 mg via INTRAVENOUS

## 2011-10-31 MED ORDER — AMLODIPINE BESYLATE 5 MG PO TABS
5.0000 mg | ORAL_TABLET | Freq: Every day | ORAL | Status: DC
  Administered 2011-10-31 – 2011-11-01 (×2): 5 mg via ORAL
  Filled 2011-10-31 (×2): qty 1

## 2011-10-31 MED ORDER — DEXAMETHASONE SODIUM PHOSPHATE 10 MG/ML IJ SOLN
10.0000 mg | Freq: Once | INTRAMUSCULAR | Status: AC
  Administered 2011-10-31: 10 mg via INTRAVENOUS
  Filled 2011-10-31: qty 1

## 2011-10-31 MED ORDER — SCOPOLAMINE 1 MG/3DAYS TD PT72
MEDICATED_PATCH | TRANSDERMAL | Status: DC | PRN
  Administered 2011-10-31: 1 via TRANSDERMAL

## 2011-10-31 MED ORDER — ONDANSETRON HCL 4 MG/2ML IJ SOLN
INTRAMUSCULAR | Status: DC | PRN
  Administered 2011-10-31: 4 mg via INTRAVENOUS

## 2011-10-31 MED ORDER — PROMETHAZINE HCL 25 MG/ML IJ SOLN: 6.2500 mg | INTRAMUSCULAR | Status: DC | PRN

## 2011-10-31 MED ORDER — EPHEDRINE SULFATE 50 MG/ML IJ SOLN
INTRAMUSCULAR | Status: DC | PRN
  Administered 2011-10-31 (×3): 5 mg via INTRAVENOUS

## 2011-10-31 MED ORDER — DOCUSATE SODIUM 100 MG PO CAPS
100.0000 mg | ORAL_CAPSULE | Freq: Two times a day (BID) | ORAL | Status: DC
  Administered 2011-10-31 – 2011-11-01 (×3): 100 mg via ORAL
  Filled 2011-10-31 (×3): qty 1

## 2011-10-31 MED ORDER — METHOCARBAMOL 500 MG PO TABS
500.0000 mg | ORAL_TABLET | Freq: Four times a day (QID) | ORAL | Status: DC | PRN
  Administered 2011-10-31 – 2011-11-01 (×2): 500 mg via ORAL
  Filled 2011-10-31 (×2): qty 1

## 2011-10-31 MED ORDER — SODIUM CHLORIDE 0.9 % IJ SOLN
3.0000 mL | Freq: Two times a day (BID) | INTRAMUSCULAR | Status: DC
  Administered 2011-11-01 (×2): 3 mL via INTRAVENOUS

## 2011-10-31 MED ORDER — GLYCOPYRROLATE 0.2 MG/ML IJ SOLN
INTRAMUSCULAR | Status: DC | PRN
  Administered 2011-10-31: .4 mg via INTRAVENOUS

## 2011-10-31 MED ORDER — SODIUM CHLORIDE 0.9 % IJ SOLN: 3.0000 mL | INTRAMUSCULAR | Status: DC | PRN

## 2011-10-31 MED ORDER — NEOSTIGMINE METHYLSULFATE 1 MG/ML IJ SOLN
INTRAMUSCULAR | Status: DC | PRN
  Administered 2011-10-31: 3 mg via INTRAVENOUS

## 2011-10-31 MED ORDER — PHENOL 1.4 % MT LIQD
1.0000 | OROMUCOSAL | Status: DC | PRN
  Filled 2011-10-31: qty 177

## 2011-10-31 MED ORDER — MENTHOL 3 MG MT LOZG: 1.0000 | LOZENGE | OROMUCOSAL | Status: DC | PRN

## 2011-10-31 MED ORDER — SODIUM CHLORIDE 0.9 % IV SOLN: 250.0000 mL | INTRAVENOUS | Status: DC

## 2011-10-31 MED ORDER — DEXAMETHASONE SODIUM PHOSPHATE 4 MG/ML IJ SOLN
4.0000 mg | Freq: Four times a day (QID) | INTRAMUSCULAR | Status: DC
  Filled 2011-10-31 (×8): qty 1

## 2011-10-31 MED ORDER — LACTATED RINGERS IV SOLN: INTRAVENOUS | Status: DC

## 2011-10-31 MED ORDER — HYDROMORPHONE HCL PF 1 MG/ML IJ SOLN
INTRAMUSCULAR | Status: AC
  Filled 2011-10-31: qty 1

## 2011-10-31 MED ORDER — MIDAZOLAM HCL 5 MG/5ML IJ SOLN
INTRAMUSCULAR | Status: DC | PRN
  Administered 2011-10-31: 1 mg via INTRAVENOUS

## 2011-10-31 MED ORDER — MEPERIDINE HCL 25 MG/ML IJ SOLN: 6.2500 mg | INTRAMUSCULAR | Status: DC | PRN

## 2011-10-31 MED ORDER — ACETAMINOPHEN 10 MG/ML IV SOLN
1000.0000 mg | Freq: Four times a day (QID) | INTRAVENOUS | Status: AC
  Administered 2011-10-31 – 2011-11-01 (×3): 1000 mg via INTRAVENOUS
  Filled 2011-10-31 (×6): qty 100

## 2011-10-31 MED ORDER — NITROFURANTOIN MACROCRYSTAL 100 MG PO CAPS
100.0000 mg | ORAL_CAPSULE | Freq: Every day | ORAL | Status: DC
  Administered 2011-10-31 – 2011-11-01 (×2): 100 mg via ORAL
  Filled 2011-10-31 (×2): qty 1

## 2011-10-31 MED ORDER — GELATIN ABSORBABLE 100 EX MISC
CUTANEOUS | Status: DC | PRN
  Administered 2011-10-31: 09:00:00 via TOPICAL

## 2011-10-31 MED ORDER — OXYCODONE HCL 5 MG PO TABS
10.0000 mg | ORAL_TABLET | ORAL | Status: DC | PRN
  Administered 2011-10-31 – 2011-11-01 (×4): 10 mg via ORAL
  Filled 2011-10-31 (×4): qty 2

## 2011-10-31 MED ORDER — HYDROMORPHONE HCL PF 1 MG/ML IJ SOLN
0.2500 mg | INTRAMUSCULAR | Status: DC | PRN
  Administered 2011-10-31 (×4): 0.5 mg via INTRAVENOUS

## 2011-10-31 MED ORDER — VANCOMYCIN HCL IN DEXTROSE 1-5 GM/200ML-% IV SOLN
1000.0000 mg | Freq: Two times a day (BID) | INTRAVENOUS | Status: DC
  Administered 2011-10-31 – 2011-11-01 (×2): 1000 mg via INTRAVENOUS
  Filled 2011-10-31 (×4): qty 200

## 2011-10-31 MED ORDER — ACETAMINOPHEN 10 MG/ML IV SOLN
1000.0000 mg | Freq: Once | INTRAVENOUS | Status: AC
  Administered 2011-10-31: 1000 mg via INTRAVENOUS
  Filled 2011-10-31: qty 100

## 2011-10-31 MED ORDER — ANASTROZOLE 1 MG PO TABS
1.0000 mg | ORAL_TABLET | Freq: Every day | ORAL | Status: DC
  Administered 2011-10-31 – 2011-11-01 (×2): 1 mg via ORAL
  Filled 2011-10-31 (×2): qty 1

## 2011-10-31 SURGICAL SUPPLY — 56 items
BUR EGG ELITE 4.0 (BURR) IMPLANT
BUR MATCHSTICK NEURO 3.0 LAGG (BURR) ×2 IMPLANT
CANISTER SUCTION 2500CC (MISCELLANEOUS) ×2 IMPLANT
CLOSURE STERI STRIP 1/2 X4 (GAUZE/BANDAGES/DRESSINGS) ×2 IMPLANT
CLOTH BEACON ORANGE TIMEOUT ST (SAFETY) ×2 IMPLANT
CORDS BIPOLAR (ELECTRODE) ×2 IMPLANT
COVER SURGICAL LIGHT HANDLE (MISCELLANEOUS) ×4 IMPLANT
DERMABOND ADVANCED (GAUZE/BANDAGES/DRESSINGS) ×1
DERMABOND ADVANCED .7 DNX12 (GAUZE/BANDAGES/DRESSINGS) ×1 IMPLANT
DRAIN CHANNEL 15F RND FF W/TCR (WOUND CARE) ×2 IMPLANT
DRAPE POUCH INSTRU U-SHP 10X18 (DRAPES) ×4 IMPLANT
DRAPE SURG 17X23 STRL (DRAPES) ×2 IMPLANT
DRAPE U-SHAPE 47X51 STRL (DRAPES) ×2 IMPLANT
DRSG MEPILEX BORDER 4X4 (GAUZE/BANDAGES/DRESSINGS) ×2 IMPLANT
DRSG MEPILEX BORDER 4X8 (GAUZE/BANDAGES/DRESSINGS) ×2 IMPLANT
DURAPREP 26ML APPLICATOR (WOUND CARE) ×2 IMPLANT
ELECT BLADE 4.0 EZ CLEAN MEGAD (MISCELLANEOUS)
ELECT CAUTERY BLADE 6.4 (BLADE) ×2 IMPLANT
ELECT REM PT RETURN 9FT ADLT (ELECTROSURGICAL) ×2
ELECTRODE BLDE 4.0 EZ CLN MEGD (MISCELLANEOUS) IMPLANT
ELECTRODE REM PT RTRN 9FT ADLT (ELECTROSURGICAL) ×1 IMPLANT
EVACUATOR 1/8 PVC DRAIN (DRAIN) IMPLANT
EVACUATOR SILICONE 100CC (DRAIN) ×2 IMPLANT
GLOVE BIOGEL PI IND STRL 6.5 (GLOVE) ×1 IMPLANT
GLOVE BIOGEL PI IND STRL 8.5 (GLOVE) ×2 IMPLANT
GLOVE BIOGEL PI INDICATOR 6.5 (GLOVE) ×1
GLOVE BIOGEL PI INDICATOR 8.5 (GLOVE) ×2
GLOVE ECLIPSE 6.0 STRL STRAW (GLOVE) ×2 IMPLANT
GLOVE ECLIPSE 8.5 STRL (GLOVE) ×4 IMPLANT
GOWN PREVENTION PLUS XXLARGE (GOWN DISPOSABLE) ×4 IMPLANT
GOWN STRL NON-REIN LRG LVL3 (GOWN DISPOSABLE) ×4 IMPLANT
KIT BASIN OR (CUSTOM PROCEDURE TRAY) ×2 IMPLANT
KIT ROOM TURNOVER OR (KITS) ×2 IMPLANT
NEEDLE 22X1 1/2 (OR ONLY) (NEEDLE) ×2 IMPLANT
NEEDLE SPNL 18GX3.5 QUINCKE PK (NEEDLE) ×4 IMPLANT
NS IRRIG 1000ML POUR BTL (IV SOLUTION) ×2 IMPLANT
PACK LAMINECTOMY ORTHO (CUSTOM PROCEDURE TRAY) ×2 IMPLANT
PACK UNIVERSAL I (CUSTOM PROCEDURE TRAY) ×2 IMPLANT
PAD ARMBOARD 7.5X6 YLW CONV (MISCELLANEOUS) ×4 IMPLANT
PATTIES SURGICAL .5 X.5 (GAUZE/BANDAGES/DRESSINGS) IMPLANT
PATTIES SURGICAL .5 X1 (DISPOSABLE) ×2 IMPLANT
SPONGE SURGIFOAM ABS GEL 100 (HEMOSTASIS) IMPLANT
STRIP CLOSURE SKIN 1/2X4 (GAUZE/BANDAGES/DRESSINGS) ×2 IMPLANT
SURGIFLO TRUKIT (HEMOSTASIS) IMPLANT
SUT MNCRL AB 3-0 PS2 18 (SUTURE) ×2 IMPLANT
SUT VIC AB 0 CT1 27 (SUTURE) ×1
SUT VIC AB 0 CT1 27XBRD ANBCTR (SUTURE) ×1 IMPLANT
SUT VIC AB 1 CTX 36 (SUTURE) ×2
SUT VIC AB 1 CTX36XBRD ANBCTR (SUTURE) ×2 IMPLANT
SUT VIC AB 2-0 CT1 18 (SUTURE) ×2 IMPLANT
SYR BULB IRRIGATION 50ML (SYRINGE) ×2 IMPLANT
SYR CONTROL 10ML LL (SYRINGE) ×2 IMPLANT
TOWEL OR 17X24 6PK STRL BLUE (TOWEL DISPOSABLE) ×4 IMPLANT
TOWEL OR 17X26 10 PK STRL BLUE (TOWEL DISPOSABLE) ×4 IMPLANT
WATER STERILE IRR 1000ML POUR (IV SOLUTION) IMPLANT
YANKAUER SUCT BULB TIP NO VENT (SUCTIONS) ×2 IMPLANT

## 2011-10-31 NOTE — Transfer of Care (Signed)
Immediate Anesthesia Transfer of Care Note  Patient: Barbara Rangel  Procedure(s) Performed:  LUMBAR LAMINECTOMY/DECOMPRESSION MICRODISCECTOMY - L4-5 Decompression with Right Facet Decompression   Patient Location: PACU  Anesthesia Type: General  Level of Consciousness: alert  and patient cooperative  Airway & Oxygen Therapy: Patient Spontanous Breathing and Patient connected to nasal cannula oxygen  Post-op Assessment: Report given to PACU RN  Post vital signs: Reviewed and stable  Complications: No apparent anesthesia complications

## 2011-10-31 NOTE — Anesthesia Preprocedure Evaluation (Addendum)
Anesthesia Evaluation    History of Anesthesia Complications (+) PONV  Airway Mallampati: I TM Distance: >3 FB Neck ROM: Full    Dental  (+) Teeth Intact   Pulmonary  clear to auscultation        Cardiovascular hypertension, Pt. on medications Regular Normal    Neuro/Psych    GI/Hepatic   Endo/Other    Renal/GU      Musculoskeletal  (+) Arthritis -, Osteoarthritis,    Abdominal   Peds  Hematology   Anesthesia Other Findings   Reproductive/Obstetrics                          Anesthesia Physical Anesthesia Plan  ASA: II  Anesthesia Plan: General   Post-op Pain Management:    Induction: Intravenous  Airway Management Planned: Oral ETT  Additional Equipment:   Intra-op Plan:   Post-operative Plan: Extubation in OR  Informed Consent: I have reviewed the patients History and Physical, chart, labs and discussed the procedure including the risks, benefits and alternatives for the proposed anesthesia with the patient or authorized representative who has indicated his/her understanding and acceptance.   Dental advisory given  Plan Discussed with: CRNA, Anesthesiologist and Surgeon  Anesthesia Plan Comments:       Anesthesia Quick Evaluation

## 2011-10-31 NOTE — Op Note (Signed)
OPERATIVE REPORT  DATE OF SURGERY: 10/31/2011  PATIENT NAME:  Barbara Rangel MRN: [id number] DOB: 01-14-1946  PCP: No primary provider on file.  PRE-OPERATIVE DIAGNOSIS:  Lumbar facet  cyst L4-5 right side  POST-OPERATIVE DIAGNOSIS:  Same  PROCEDURE:   Gill decompression L4-5 with excision of synovial cyst.  SURGEON:  Venita Lick, MD  PHYSICIAN ASSISTANT: Norval Gable   ANESTHESIA:   General  EBL: 50 ml   BRIEF HISTORY: Barbara Rangel is a 65 y.o. female who resented to my office with complaints of back and  significant right leg pain. Clinical and radiographic analysis confirmed the diagnosis of synovial cyst causing nerve compression. After attempts at conservative management had failed to alleviate her symptoms she elected to proceed with a surgical decompression. After discussing all appropriate risks benefits and alternatives to surgery consent was obtained.    PROCEDURE DETAILS: Patient was brought into the operating room. After successful induction of general anesthesia and tracheal intubation a Time Out was done. This confirmed all pertinent important data. Patient was turned prone onto the Wilson frame and all bony prominences were well padded. The back was then prepped and draped in a standard fashion. 2 needles were placed into the back for localization of the incision. X-ray was done to confirm that we are at the L4-5 level. The incision was infiltrated with quarter percent Marcaine and a midline incision was made centered over the L4-5 space. Sharp dissection was carried out down to the deep fascia. I incised the deep fascia and then using a Cobb elevator to strip the paraspinal muscles to expose the right-hand side of the spinous process and lamina of L4 and L5 and the L4-5 facet complex. I then placed a Penfield 4 into the L4-5 facet joint and then took another x-ray to confirm that this was in fact the appropriate level. Once I had done this I then used a  osteotome to remove the inferior L4 facet in his parents. Once I had the L4 inferior facet removed I then used I nerve the nerve curette to develop a plane underneath the lamina of L4 and then used a 2 and 3 mm Kerrison punch to perform a generous laminotomy. I then developed a plane underneath the medial osteophyte from the superior L5 facet and resected this with a 2 mm Kerrison punch. At this point I then the ligamentum flavum from the leading edge of the L5 lamina with a small curet. I then resected the entire ligamentum flavum to expose the thecal sac. I then continued my dissection to the lateral gutter. At this point I could visualize the L5 nerve root and it was significantly inflamed and irritated. I then released and I then remove more of the medial of the L5 superior facet until I could palpate and visualize the medial border of the L5 pedicle I then continued my dissection into the L5 foramen with the 2 and 3 mm Kerrison punch. At this point I could take a [facility] and freely passed out the L5 foramen along the lateral recess. At this point I was very pleased with the inferior portion of my decompression.  I then continued superiorly until I could palpate the inferior aspect of the L4 pedicle with my Public house manager. I then swept out into the L4 foramen and using a 2 and 3 mm Kerrison punch performed a generous foraminotomy at this level. There was also significant inflammation and irritation to the L4 nerve root as well. There is  also remnants of synovial cyst which is able to resect.  At this point time with the Gill decompression completed I again swept with I nerve hook and Woodson elevator superior to the L4 pedicle into the lateral recess and a and centrally and out the neural foramen. At an adequate decompression at all levels and there was no further neural compression.  At this point the wound was copiously reviewed normal saline I obtained hemostasis using bipolar cautery and  then closed the deep fascia with interrupted #1 Vicryl sutures 2-0 Vicryl sutures and a 3-0 Monocryl for the skin. Steri-Strips dry dressing were applied the patient was extubated transferred to PACU incident. At the end of the case all needle sponge counts were correct there was no adverse intraoperative events  Venita Lick, MD 10/31/2011 9:34 AM

## 2011-10-31 NOTE — Anesthesia Postprocedure Evaluation (Signed)
  Anesthesia Post-op Note  Patient: Barbara Rangel  Procedure(s) Performed:  LUMBAR LAMINECTOMY/DECOMPRESSION MICRODISCECTOMY - L4-5 Decompression with Right Facet Decompression   Patient Location: PACU  Anesthesia Type: General  Level of Consciousness: sedated  Airway and Oxygen Therapy: Patient Spontanous Breathing and Patient connected to nasal cannula oxygen  Post-op Pain: none  Post-op Assessment: Post-op Vital signs reviewed, Patient's Cardiovascular Status Stable, Respiratory Function Stable, Patent Airway and No signs of Nausea or vomiting  Post-op Vital Signs: Reviewed and stable  Complications: No apparent anesthesia complications

## 2011-10-31 NOTE — Preoperative (Signed)
Beta Blockers   Reason not to administer Beta Blockers:Not Applicable 

## 2011-10-31 NOTE — H&P (Signed)
no change in clinical exam See dictated off ice notes for details Positive right leg pain due to synovial cyst Plan on Gill decompression L4/5

## 2011-11-01 ENCOUNTER — Other Ambulatory Visit: Payer: Self-pay | Admitting: *Deleted

## 2011-11-01 ENCOUNTER — Encounter (HOSPITAL_COMMUNITY): Payer: Self-pay | Admitting: Orthopedic Surgery

## 2011-11-01 ENCOUNTER — Other Ambulatory Visit (HOSPITAL_COMMUNITY): Payer: PRIVATE HEALTH INSURANCE

## 2011-11-01 DIAGNOSIS — M7138 Other bursal cyst, other site: Secondary | ICD-10-CM

## 2011-11-01 DIAGNOSIS — M8580 Other specified disorders of bone density and structure, unspecified site: Secondary | ICD-10-CM

## 2011-11-01 MED ORDER — METHOCARBAMOL 500 MG PO TABS: 500.0000 mg | ORAL_TABLET | Freq: Three times a day (TID) | ORAL | Status: DC

## 2011-11-01 MED ORDER — ALENDRONATE SODIUM 70 MG PO TABS: 70.0000 mg | ORAL_TABLET | ORAL | Status: DC

## 2011-11-01 MED ORDER — OXYCODONE-ACETAMINOPHEN 10-325 MG PO TABS: 1.0000 | ORAL_TABLET | Freq: Four times a day (QID) | ORAL | Status: AC | PRN

## 2011-11-01 MED ORDER — OXYCODONE-ACETAMINOPHEN 10-325 MG PO TABS: 1.0000 | ORAL_TABLET | Freq: Four times a day (QID) | ORAL | Status: DC | PRN

## 2011-11-01 MED ORDER — POLYETHYLENE GLYCOL 3350 17 G PO PACK: 17.0000 g | PACK | Freq: Every day | ORAL | Status: AC

## 2011-11-01 MED ORDER — METHOCARBAMOL 500 MG PO TABS: 500.0000 mg | ORAL_TABLET | Freq: Three times a day (TID) | ORAL | Status: AC

## 2011-11-01 MED ORDER — ONDANSETRON HCL 4 MG PO TABS: 4.0000 mg | ORAL_TABLET | Freq: Three times a day (TID) | ORAL | Status: AC | PRN

## 2011-11-01 NOTE — Progress Notes (Addendum)
Physical Therapy Evaluation/*Discharge* Patient Details Name: Barbara Rangel MRN: [id number] DOB: May 17, 1946 Today's Date: 11/01/2011  Problem List:  Patient Active Problem List  Diagnoses  . Synovial cyst of lumbar facet joint    Past Medical History:  Past Medical History  Diagnosis Date  . PONV (postoperative nausea and vomiting)   . Other and unspecified general anesthetics causing adverse effect in therapeutic use     hard to wake up  . Chronic back pain     right radicular leg pain  . Constipation     r/t pain meds and takes Dulcolax nightly  . FHx: colonic polyps     hx of and mother had colon cancer  . Chronic urinary tract infection     takes Macrodantin and Cranberry daily  . Glaucoma     uses Xalantan nightly  . Cancer     hx breast cancer-right  . Hypertension     takes Amlodipine daily  . Arthritis     degenerative arthritis   Past Surgical History:  Past Surgical History  Procedure Date  . Tubal ligation 1980  . Eye surgery 2004    right eye-detached retina  . Cataract surgery 2005    right eye  . Right breast biopsy 2005  . Breast lumpectomy     x2  . Left breast biopsy 2009  . Ankle surgery 2011    left ankle with screws and plates  . Colonoscopy   . Retinal detachment surgery 2004  . Mastectomy 2005    right  d/t breast cancer;no sticks to right arm LYMPH NODES REMOVED.    PT Assessment/Plan/Recommendation PT Assessment Clinical Impression Statement: Pt. is 65 y/o female s/p L 4-5 removal of cyst.  All education has been completed and pt. with no further skilled acute PT needs. PT Recommendation/Assessment: All further PT needs can be met in the next venue of care PT Problem List: Decreased mobility;Decreased knowledge of use of DME;Pain PT Recommendation Follow Up Recommendations: Home health PT Equipment Recommended: None recommended by PT   PT Evaluation Precautions/Restrictions  Precautions Precautions: Back Precaution  Booklet Issued: Yes (comment) Precaution Comments: provided handouts Required Braces or Orthoses: No Restrictions Weight Bearing Restrictions: No Prior Functioning  Home Living Lives With: Spouse Receives Help From: Family Type of Home: House Home Layout: One level;Full bath on main level Home Access: Stairs to enter Entrance Stairs-Rails: Right Entrance Stairs-Number of Steps: 6-7 Bathroom Shower/Tub: Tub/shower unit;Walk-in shower Bathroom Toilet: Standard Bathroom Accessibility: Yes How Accessible: Accessible via walker Home Adaptive Equipment: Bedside commode/3-in-1;Walker - rolling (pt. unsure but thinks she has these items) Prior Function Level of Independence: Independent with basic ADLs;Independent with homemaking with ambulation;Independent with gait;Independent with transfers Able to Take Stairs?: Reciprically Leisure: Hobbies-no Cognition Cognition Arousal/Alertness: Awake/alert Overall Cognitive Status: Appears within functional limits for tasks assessed Orientation Level: Oriented X4 Sensation/Coordination Sensation Light Touch: Appears Intact Stereognosis: Not tested Hot/Cold: Not tested Proprioception: Not tested Coordination Gross Motor Movements are Fluid and Coordinated: Yes Fine Motor Movements are Fluid and Coordinated: Yes Extremity Assessment RUE Assessment RUE Assessment: Not tested LUE Assessment LUE Assessment: Not tested RLE Assessment RLE Assessment: Within Functional Limits LLE Assessment LLE Assessment: Within Functional Limits Mobility (including Balance) Bed Mobility Bed Mobility: Yes Rolling Right: 5: Supervision Rolling Right Details (indicate cue type and reason): VCs for safe technique Right Sidelying to Sit: 5: Supervision;HOB flat Right Sidelying to Sit Details (indicate cue type and reason): VCs for safe and correct technique Transfers Transfers: Yes Sit  to Stand: 7: Independent;From bed Stand to Sit: 7: Independent;To  bed Ambulation/Gait Ambulation/Gait: Yes Ambulation/Gait Assistance: 5: Supervision Ambulation/Gait Assistance Details (indicate cue type and reason): Pt. reached for hallway rail x 2, no LOB or need for rail Ambulation Distance (Feet): 200 Feet Assistive device: None Gait Pattern: Within Functional Limits Stairs: Yes Stairs Assistance: 5: Supervision;4: Min assist Stairs Assistance Details (indicate cue type and reason): VCs for technique Stair Management Technique: One rail Right;Step to pattern;Other (comment) (hand held A without rails to simulate front steps) Number of Stairs: 4  (2x2 trials) Height of Stairs: 6  Wheelchair Mobility Wheelchair Mobility: No  Posture/Postural Control Posture/Postural Control: No significant limitations Balance Balance Assessed: No  End of Session PT - End of Session Equipment Utilized During Treatment: Gait belt Activity Tolerance: Patient tolerated treatment well Patient left: in bed;with family/visitor present (OT present) Nurse Communication: Mobility status for ambulation;Mobility status for transfers General Behavior During Session: [facility] for tasks performed Cognition: [facility] for tasks performed  Feltis, Nicki Reaper 11/01/2011, 9:39 AM Nicki Reaper. Feltis, PT, DPT [phone]  Addendum: Pt. Discharged from acute PT services.  All education completed and pt. Verbalized understanding.   Nicki Reaper. Feltis, PT, DPT [phone]  11/01/2011  9:44 AM

## 2011-11-01 NOTE — Discharge Summary (Signed)
Patient ID: Barbara Rangel MRN: [id number] DOB/AGE: [birth year] 65 y.o.  Admit date: 10/31/2011 Discharge date: 11/01/2011  Admission Diagnoses:  Active Problems:  Synovial cyst of lumbar facet joint   Discharge Diagnoses:  Same  Past Medical History  Diagnosis Date  . PONV (postoperative nausea and vomiting)   . Other and unspecified general anesthetics causing adverse effect in therapeutic use     hard to wake up  . Chronic back pain     right radicular leg pain  . Constipation     r/t pain meds and takes Dulcolax nightly  . FHx: colonic polyps     hx of and mother had colon cancer  . Chronic urinary tract infection     takes Macrodantin and Cranberry daily  . Glaucoma     uses Xalantan nightly  . Cancer     hx breast cancer-right  . Hypertension     takes Amlodipine daily  . Arthritis     degenerative arthritis    Surgeries: Procedure(s): LUMBAR LAMINECTOMY/DECOMPRESSION MICRODISCECTOMY on 10/31/2011   Consultants:    Discharged Condition: Improved  Hospital Course: Barbara Rangel is an 65 y.o. female who was admitted 10/31/2011 for operative treatment of lumbar stenosis and right L4-5 synovial facet cyst . Patient has severe unremitting pain that affects sleep, daily activities, and work/hobbies. After pre-op clearance the patient was taken to the operating room on 10/31/2011 and underwent  Procedure(s): LUMBAR LAMINECTOMY/DECOMPRESSION MICRODISCECTOMY.    Patient was given perioperative antibiotics: Anti-infectives     Start     Dose/Rate Route Frequency Ordered Stop   10/31/11 1330   vancomycin (VANCOCIN) IVPB 1000 mg/200 mL premix        1,000 mg 200 mL/hr over 60 Minutes Intravenous Every 12 hours 10/31/11 1234     10/30/11 1515   vancomycin (VANCOCIN) IVPB 1000 mg/200 mL premix        1,000 mg 200 mL/hr over 60 Minutes Intravenous 60 min pre-op 10/30/11 1507 10/31/11 8295           Patient was given sequential compression devices and  early ambulation to prevent DVT.  Patient benefited maximally from hospital stay and there were no complications.    Recent vital signs: Patient Vitals for the past 24 hrs:  BP Temp Temp src Pulse Resp SpO2 Height Weight  11/01/11 0607 118/64 mmHg 97.7 F (36.5 C) Oral 73  18  99 % - -  10/31/11 2107 133/58 mmHg 99.4 F (37.4 C) Oral 74  18  100 % - -  10/31/11 1545 142/76 mmHg 98.7 F (37.1 C) - 90  18  99 % - -  10/31/11 1230 148/78 mmHg 97.8 F (36.6 C) Oral 85  16  99 % 5\' 5"  (1.651 m) 64.6 kg (142 lb 6.7 oz)  10/31/11 1200 - 98 F (36.7 C) - - - - - -  10/31/11 1000 - 97.6 F (36.4 C) - - - - - -     Recent laboratory studies:  Basename 10/29/11 1153  WBC 6.2  HGB 15.3*  HCT 44.2  PLT 211  NA 143  K 4.7  CL 103  CO2 28  BUN 12  CREATININE 0.62  GLUCOSE 93  INR --  CALCIUM 10.7*     Discharge Medications:  Current Discharge Medication List    START taking these medications   Details  methocarbamol (ROBAXIN) 500 MG tablet Take 1 tablet (500 mg total) by mouth 3 (three) times daily. Qty: 90 tablet,  Refills: 0    ondansetron (ZOFRAN) 4 MG tablet Take 1 tablet (4 mg total) by mouth every 8 (eight) hours as needed for nausea. Qty: 20 tablet, Refills: 0    oxyCODONE-acetaminophen (PERCOCET) 10-325 MG per tablet Take 1 tablet by mouth every 6 (six) hours as needed for pain. Qty: 120 tablet, Refills: 0    polyethylene glycol (MIRALAX) packet Take 17 g by mouth daily. Qty: 14 each, Refills: 0      CONTINUE these medications which have NOT CHANGED   Details  alendronate (FOSAMAX) 70 MG tablet Take 70 mg by mouth every 7 (seven) days. Hold while in hospital. Thursday.  Take with a full glass of water on an empty stomach.    amLODipine (NORVASC) 5 MG tablet Take 5 mg by mouth daily.      anastrozole (ARIMIDEX) 1 MG tablet Take 1 mg by mouth daily.      calcium citrate-vitamin D (CITRACAL+D) 315-200 MG-UNIT per tablet Take 1 tablet by mouth 2 (two) times daily.       docusate sodium (COLACE) 100 MG capsule Take 100 mg by mouth 2 (two) times daily.      latanoprost (XALATAN) 0.005 % ophthalmic solution Place 1 drop into both eyes at bedtime.      loratadine (CLARITIN REDITABS) 10 MG dissolvable tablet Take 10 mg by mouth daily.      Multiple Vitamins-Minerals (MULTIVITAMINS THER. W/MINERALS) TABS Take 1 tablet by mouth daily.      nitrofurantoin (MACRODANTIN) 100 MG capsule Take 100 mg by mouth daily.      OVER THE COUNTER MEDICATION Take 1 capsule by mouth daily. ellura- cranberry extract      STOP taking these medications     acetaminophen (TYLENOL) 500 MG tablet      celecoxib (CELEBREX) 200 MG capsule      HYDROcodone-acetaminophen (NORCO) 10-325 MG per tablet         Diagnostic Studies: Dg Lumbar Spine 2-3 Views  10/31/2011  *RADIOLOGY REPORT*  Clinical Data: 65 year old female undergoing lumbar surgery.  LUMBAR SPINE - 2-3 VIEW  Comparison: 10/02/2011.  Findings: 2 portable cross-table lateral intraoperative views of the lumbar spine. Normal lumbar segmentation.  Film #1 and 0810 hours.  Two needles are in place posteriorly.  The more cephalad needle is directed at the L4 vertebral body.  The more caudal needle is directed at the L5 S1 disc space.  Film #2 0830 hours.  Surgical probe directed at the L5 vertebral body at the pedicle level.  IMPRESSION: Intraoperative localization as above.  Original Report Authenticated By: Harley Hallmark, M.D.   Dg Lumbar Spine 2-3 Views  10/02/2011  *RADIOLOGY REPORT*  Clinical Data: Decompression L4-5.  Please number levels  LUMBAR SPINE - 2-3 VIEW  Comparison: MRI 02/26/2011  Findings: Five lumbar vertebral bodies.  The lowest disc space is L5-S1.  Grade 1 anterior slip L4 on L5.  Mild disc space narrowing L2-3, L3- 4, and L4-5.  L5-S1 is intact.  Negative for fracture.  IMPRESSION: Grade 1 slip L4 on L5 with mild disc space narrowing.  No acute bony abnormality.  Original Report Authenticated By: Camelia Phenes, M.D.    Disposition:   Discharge Orders    Future Appointments: Provider: Department: Dept Phone: Center:   12/27/2011 9:30 AM [facility] [phone] [facility]     Future Orders Please Complete By Expires   Diet - low sodium heart healthy      Call  MD / Call 911      Comments:   If you experience chest pain or shortness of breath, CALL 911 and be transported to the hospital emergency room.  If you develope a fever above 101 F, pus (white drainage) or increased drainage or redness at the wound, or calf pain, call your surgeon's office.   Constipation Prevention      Comments:   Drink plenty of fluids.  Prune juice may be helpful.  You may use a stool softener, such as Colace (over the counter) 100 mg twice a day.  Use MiraLax (over the counter) for constipation as needed.   Increase activity slowly as tolerated      Weight Bearing as taught in Physical Therapy      Comments:   Use a walker or crutches as instructed.   Discharge wound care:      Comments:   Keep clean and dry.  Change your bandage as instructed by your health care providers. May shower on Monday, pat to dry.  DO NOT put lotion or powder on your incision.      Follow-up Information    Follow up with BROOKS,DAHARI D in 2 weeks.   Contact information:   [facility] [address] [place] [address] [phone]         Disposition at time of DC:  SABLE Discharge plan:  DC to home  Signed: Gwinda Maine 11/01/2011, 7:37 AM

## 2011-11-01 NOTE — Progress Notes (Signed)
Barbara Rangel 65 y.o. 10/31/2011   Lab. Results:  Basename 10/29/11 1153  WBC 6.2  HGB 15.3*  HCT 44.2  PLT 211   BMET  Basename 10/29/11 1153  NA 143  K 4.7  CL 103  CO2 28  GLUCOSE 93  BUN 12  CREATININE 0.62  CALCIUM 10.7*    INR  Date Value Range Status  10/02/2011 0.95  0.00-1.49 (no units) Final   VITALS Filed Vitals:   11/01/11 0607  BP: 118/64  Pulse: 73  Temp: 97.7 F (36.5 C)  Resp: 18     Subjective Doing well.  No radicular leg pain Objective Neg st leg raise NVI No sob/cp Ambulating Voiding spontaneously  Assessment/ Plan Patient doing well Ok for d/c to home F/u 2 weeks ]instructions provided   Barbara Rangel D 12/21/20127:38 AM

## 2011-11-01 NOTE — Progress Notes (Signed)
Occupational Therapy Evaluation Patient Details Name: Barbara Rangel MRN: [id number] DOB: May 06, 1946 Today's Date: 11/01/2011  Problem List:  Patient Active Problem List  Diagnoses  . Synovial cyst of lumbar facet joint    Past Medical History:  Past Medical History  Diagnosis Date  . PONV (postoperative nausea and vomiting)   . Other and unspecified general anesthetics causing adverse effect in therapeutic use     hard to wake up  . Chronic back pain     right radicular leg pain  . Constipation     r/t pain meds and takes Dulcolax nightly  . FHx: colonic polyps     hx of and mother had colon cancer  . Chronic urinary tract infection     takes Macrodantin and Cranberry daily  . Glaucoma     uses Xalantan nightly  . Cancer     hx breast cancer-right  . Hypertension     takes Amlodipine daily  . Arthritis     degenerative arthritis   Past Surgical History:  Past Surgical History  Procedure Date  . Tubal ligation 1980  . Eye surgery 2004    right eye-detached retina  . Cataract surgery 2005    right eye  . Right breast biopsy 2005  . Breast lumpectomy     x2  . Left breast biopsy 2009  . Ankle surgery 2011    left ankle with screws and plates  . Colonoscopy   . Retinal detachment surgery 2004  . Mastectomy 2005    right  d/t breast cancer;no sticks to right arm LYMPH NODES REMOVED.    OT Assessment/Plan/Recommendation OT Assessment Clinical Impression Statement: Pt s/p L4-5 removal of cyst.  All education completed and pt will have necessary level of assistance upon d/c home with husband.  No further acute OT needs. OT Recommendation/Assessment: Patient does not need any further OT services OT Recommendation Equipment Recommended: None recommended by OT OT Goals    OT Evaluation Precautions/Restrictions  Precautions Precautions: Back Precaution Booklet Issued: Yes (comment) Precaution Comments: provided handouts Required Braces or Orthoses:  No Restrictions Weight Bearing Restrictions: No Prior Functioning Home Living Lives With: Spouse Receives Help From: Family Type of Home: House Home Layout: One level;Full bath on main level Home Access: Stairs to enter Entrance Stairs-Rails: Right Entrance Stairs-Number of Steps: 6-7 Bathroom Shower/Tub: Tub/shower unit;Walk-in shower Bathroom Toilet: Standard Bathroom Accessibility: Yes How Accessible: Accessible via walker Home Adaptive Equipment: Bedside commode/3-in-1;Walker - rolling (pt. unsure but thinks she has these items) Prior Function Level of Independence: Independent with basic ADLs;Independent with homemaking with ambulation;Independent with gait;Independent with transfers Able to Take Stairs?: Reciprically Leisure: Hobbies-no ADL ADL Grooming: Simulated;Modified independent Grooming Details (indicate cue type and reason): Mod I for increased time Where Assessed - Grooming: Standing at sink Upper Body Bathing: Simulated;Modified independent Where Assessed - Upper Body Bathing: Sitting, bed Lower Body Bathing: Simulated;Modified independent Where Assessed - Lower Body Bathing: Sit to stand from bed;Unsupported Upper Body Dressing: Performed;Modified independent Where Assessed - Upper Body Dressing: Sitting, bed;Unsupported Lower Body Dressing: Performed;Modified independent Where Assessed - Lower Body Dressing: Sit to stand from bed;Unsupported Toilet Transfer: Performed;Supervision/safety Toilet Transfer Method: Proofreader: Raised toilet seat with arms (or 3-in-1 over toilet) Toileting - Clothing Manipulation: Performed;Modified independent Where Assessed - Toileting Clothing Manipulation: Sit to stand from 3-in-1 or toilet Toileting - Hygiene: Performed;Modified independent Where Assessed - Toileting Hygiene: Standing ADL Comments: Pt performing BADLs and functional mobility and mod I/supervision level for safety  and increased time.  Pt able to demonstrate back safety precautions with I during functional tasks, Vision/Perception    Cognition Cognition Arousal/Alertness: Awake/alert Overall Cognitive Status: Appears within functional limits for tasks assessed Orientation Level: Oriented X4 Sensation/Coordination Sensation Light Touch: Appears Intact Stereognosis: Not tested Hot/Cold: Not tested Proprioception: Not tested Coordination Gross Motor Movements are Fluid and Coordinated: Yes Fine Motor Movements are Fluid and Coordinated: Yes Extremity Assessment RUE Assessment RUE Assessment: Within Functional Limits LUE Assessment LUE Assessment: Within Functional Limits Mobility  Bed Mobility Bed Mobility: Yes Rolling Right: 5: Supervision Rolling Right Details (indicate cue type and reason): VCs for safe technique Right Sidelying to Sit: 5: Supervision;HOB flat Right Sidelying to Sit Details (indicate cue type and reason): VCs for safe and correct technique Transfers Sit to Stand: 7: Independent;From bed Stand to Sit: 7: Independent;To bed Exercises   End of Session OT - End of Session Activity Tolerance: Patient tolerated treatment well Patient left: in bed;with call bell in reach;with family/visitor present General Behavior During Session: [facility] for tasks performed Cognition: [facility] for tasks performed   Cipriano Mile 11/01/2011, 1:14 PM  11/01/2011 Cipriano Mile OTR/L Pager [phone] Office [phone]

## 2011-11-28 ENCOUNTER — Other Ambulatory Visit: Payer: Self-pay | Admitting: Hematology and Oncology

## 2011-11-28 ENCOUNTER — Telehealth: Payer: Self-pay | Admitting: Hematology and Oncology

## 2011-11-28 DIAGNOSIS — Z1231 Encounter for screening mammogram for malignant neoplasm of breast: Secondary | ICD-10-CM

## 2011-11-28 DIAGNOSIS — Z9011 Acquired absence of right breast and nipple: Secondary | ICD-10-CM

## 2011-11-28 NOTE — Telephone Encounter (Signed)
S/w pt today re appts for 2/18 and 2/20

## 2011-12-03 ENCOUNTER — Telehealth: Payer: Self-pay | Admitting: *Deleted

## 2011-12-03 NOTE — Telephone Encounter (Signed)
Spoke with pt and informed pt re:  Per Dr. Dalene Carrow ,  Keep taking Arimidex until pt sees md again in Feb.     Pt voiced understanding.

## 2011-12-03 NOTE — Telephone Encounter (Signed)
Pt called and would like to have clarifications from Dr. Dalene Carrow re:  At last office visit, pt was instructed by md to stop Arimidex prior to next f/u appt.    Pt has f/u appt with md for  01/01/12.   Pt wanted to know if she should stop taking Arimidex now. Pt's   Phone     [phone].

## 2011-12-26 ENCOUNTER — Other Ambulatory Visit: Payer: Self-pay | Admitting: *Deleted

## 2011-12-26 DIAGNOSIS — C50919 Malignant neoplasm of unspecified site of unspecified female breast: Secondary | ICD-10-CM

## 2011-12-26 NOTE — Progress Notes (Signed)
Received call from pt wanting to know if pt will have lab test done to check on pt's estrogen level.   Pt stated she has been on Arimidex for 7 yrs.   Informed pt that she does have  CA 27.29  Level ordered when pt comes in for lab on 12/30/11. Pt voiced understanding. Pt's  Phone    [phone].

## 2011-12-27 ENCOUNTER — Ambulatory Visit
Admission: RE | Admit: 2011-12-27 | Discharge: 2011-12-27 | Disposition: A | Discharge status: Home / Self Care | Payer: PRIVATE HEALTH INSURANCE | Source: Ambulatory Visit | Attending: Hematology and Oncology | Admitting: Hematology and Oncology

## 2011-12-27 DIAGNOSIS — Z853 Personal history of malignant neoplasm of breast: Secondary | ICD-10-CM

## 2011-12-27 DIAGNOSIS — Z9011 Acquired absence of right breast and nipple: Secondary | ICD-10-CM

## 2011-12-27 DIAGNOSIS — Z1231 Encounter for screening mammogram for malignant neoplasm of breast: Secondary | ICD-10-CM

## 2011-12-30 ENCOUNTER — Other Ambulatory Visit (HOSPITAL_BASED_OUTPATIENT_CLINIC_OR_DEPARTMENT_OTHER): Payer: PRIVATE HEALTH INSURANCE | Admitting: Lab

## 2011-12-30 ENCOUNTER — Ambulatory Visit: Payer: PRIVATE HEALTH INSURANCE | Attending: Orthopedic Surgery | Admitting: Physical Therapy

## 2011-12-30 DIAGNOSIS — M6281 Muscle weakness (generalized): Secondary | ICD-10-CM | POA: Insufficient documentation

## 2011-12-30 DIAGNOSIS — C50919 Malignant neoplasm of unspecified site of unspecified female breast: Secondary | ICD-10-CM

## 2011-12-30 DIAGNOSIS — R5381 Other malaise: Secondary | ICD-10-CM | POA: Insufficient documentation

## 2011-12-30 DIAGNOSIS — IMO0001 Reserved for inherently not codable concepts without codable children: Secondary | ICD-10-CM | POA: Insufficient documentation

## 2011-12-30 DIAGNOSIS — M545 Low back pain, unspecified: Secondary | ICD-10-CM | POA: Insufficient documentation

## 2011-12-30 LAB — CBC WITH DIFFERENTIAL/PLATELET
BASO%: 0.3 % (ref 0.0–2.0)
Basophils Absolute: 0 10*3/uL (ref 0.0–0.1)
EOS%: 1.4 % (ref 0.0–7.0)
Eosinophils Absolute: 0.1 10*3/uL (ref 0.0–0.5)
HCT: 43.4 % (ref 34.8–46.6)
HGB: 14.7 g/dL (ref 11.6–15.9)
LYMPH%: 34.3 % (ref 14.0–49.7)
MCH: 32.7 pg (ref 25.1–34.0)
MCHC: 33.8 g/dL (ref 31.5–36.0)
MCV: 96.6 fL (ref 79.5–101.0)
MONO#: 0.4 10*3/uL (ref 0.1–0.9)
MONO%: 8.1 % (ref 0.0–14.0)
NEUT#: 2.8 10*3/uL (ref 1.5–6.5)
NEUT%: 55.9 % (ref 38.4–76.8)
Platelets: 205 10*3/uL (ref 145–400)
RBC: 4.5 10*6/uL (ref 3.70–5.45)
RDW: 13.6 % (ref 11.2–14.5)
WBC: 4.9 10*3/uL (ref 3.9–10.3)
lymph#: 1.7 10*3/uL (ref 0.9–3.3)

## 2011-12-30 LAB — COMPREHENSIVE METABOLIC PANEL
ALT: 14 U/L (ref 0–35)
AST: 21 U/L (ref 0–37)
Albumin: 4.2 g/dL (ref 3.5–5.2)
Alkaline Phosphatase: 66 U/L (ref 39–117)
BUN: 21 mg/dL (ref 6–23)
CO2: 28 mEq/L (ref 19–32)
Calcium: 9.8 mg/dL (ref 8.4–10.5)
Chloride: 105 mEq/L (ref 96–112)
Creatinine, Ser: 0.72 mg/dL (ref 0.50–1.10)
Glucose, Bld: 82 mg/dL (ref 70–99)
Potassium: 4.6 mEq/L (ref 3.5–5.3)
Sodium: 142 mEq/L (ref 135–145)
Total Bilirubin: 0.4 mg/dL (ref 0.3–1.2)
Total Protein: 6.8 g/dL (ref 6.0–8.3)

## 2011-12-30 LAB — CANCER ANTIGEN 27.29: CA 27.29: 23 U/mL (ref 0–39)

## 2011-12-30 LAB — LACTATE DEHYDROGENASE: LDH: 142 U/L (ref 94–250)

## 2012-01-01 ENCOUNTER — Telehealth: Payer: Self-pay | Admitting: Hematology and Oncology

## 2012-01-01 ENCOUNTER — Ambulatory Visit (HOSPITAL_BASED_OUTPATIENT_CLINIC_OR_DEPARTMENT_OTHER): Payer: PRIVATE HEALTH INSURANCE | Admitting: Hematology and Oncology

## 2012-01-01 ENCOUNTER — Other Ambulatory Visit: Payer: Self-pay | Admitting: Hematology and Oncology

## 2012-01-01 VITALS — BP 129/73 | HR 90 | Temp 97.8°F | Ht 65.0 in | Wt 143.0 lb

## 2012-01-01 DIAGNOSIS — Z853 Personal history of malignant neoplasm of breast: Secondary | ICD-10-CM

## 2012-01-01 DIAGNOSIS — C50919 Malignant neoplasm of unspecified site of unspecified female breast: Secondary | ICD-10-CM

## 2012-01-01 NOTE — Progress Notes (Signed)
This office note has been dictated.

## 2012-01-01 NOTE — Progress Notes (Signed)
CC:   Barbara Rangel, M.D.  IDENTIFYING STATEMENT:  The patient is a 66 year old woman with breast cancer who presents for followup.  INTERVAL HISTORY:  Barbara Rangel was last seen 9 months ago.  She has had no major concerns.  She has had no loss in weight.  Denies palpating any new breast lumps.  She has not noticed any chest wall lesions.  Denies bone pain.  A recent mammogram on 12/27/2011 no mammographic evidence for a recurrence.  The patient also received a bone density testing and this showed and that there was 4.7% decrease in bone mineral density in the left femur when compared to last year. She is on Fosamax, calcium and vitamin D.  MEDICATIONS:  Arimidex 1 mg daily, Fosamax 70 mg weekly.  Rest of medicines reviewed and updated.  ALLERGIES:  Trimethoprim, Cipro, Avelox, Celexa, Keflex.  Past medical history, family history and social history unchanged.  REVIEW OF SYSTEMS:  Ten-point review of systems negative.  PHYSICAL EXAMINATION:  The patient is a well-appearing, well-nourished woman in no distress.  Vitals:  Pulse 90, blood pressure 139/73, temperature 97.8, respirations 20, weight 143 pounds.  HEENT:  Head is atraumatic, normocephalic.  Sclerae anicteric.  Mouth moist.  Chest: Clear.  CVS:  Unremarkable.  Abdomen:  Soft, nontender.  Bowel sounds present.  Extremities:  No edema or calf tenderness.  Breast exam showed a well-healed right incision with no surrounding chest wall masses or lesions.  Left breast showed no dominant masses or nipple discharge. CNS:  Nonfocal.  LABORATORY DATA:  White cell count 4.6, hemoglobin 14.7, hematocrit 43.4, platelets 205.  Sodium 142, potassium 4.6, chloride 105, CO2 28, BUN 21, creatinine 0.72, glucose 82, total bilirubin 0.4, alkaline phosphatase 66, AST 21, ALT 14, calcium 9.8.  CA27-29 of 23.  IMPRESSION AND PLAN:  Barbara Rangel is a 66 year old woman who is status post mastectomy for a T2 N1 M0, estrogen receptor/progesterone  receptor-positive and HER-2/neu-negative invasive right breast cancer diagnosed in June 2005.  She is status post NSABP B28 trial and began dose-dense AC followed by Taxol from November 19, 2003, through September 21, 2004, in [place].  She began Arimidex in the adjuvant setting in January 2006.  In total, Barbara Rangel has received 7 years of Arimidex.   I reviewed the recent ATLAS study which has shown that Tamoxifen for 10 years versus 5 years statistical decreases mortality.  Thus, it is reasonable to extrapolate the data to an aromatase inhibitor such as Arimidex.  Barbara Rangel has tolerated treatment well.  Her current exam and labs indicate no evidence of recurrence.  Barbara Rangel indicates that she would like to continue with the present course with annual monitoring of bone density.  She follows up in 9 months' time.    ______________________________ Laurice Record, M.D. LIO/MEDQ  D:  01/01/2012  T:  01/01/2012  Job:  [id number]

## 2012-01-01 NOTE — Telephone Encounter (Signed)
Gv pt appt for nov2013. scheduled bone density and mammogram for 12/29/2012 @ [facility]

## 2012-01-06 ENCOUNTER — Ambulatory Visit: Payer: PRIVATE HEALTH INSURANCE | Admitting: Physical Therapy

## 2012-01-08 ENCOUNTER — Ambulatory Visit: Payer: PRIVATE HEALTH INSURANCE | Admitting: Physical Therapy

## 2012-01-13 ENCOUNTER — Ambulatory Visit: Payer: PRIVATE HEALTH INSURANCE | Attending: Orthopedic Surgery | Admitting: Physical Therapy

## 2012-01-13 DIAGNOSIS — R5381 Other malaise: Secondary | ICD-10-CM | POA: Insufficient documentation

## 2012-01-13 DIAGNOSIS — M6281 Muscle weakness (generalized): Secondary | ICD-10-CM | POA: Insufficient documentation

## 2012-01-13 DIAGNOSIS — IMO0001 Reserved for inherently not codable concepts without codable children: Secondary | ICD-10-CM | POA: Insufficient documentation

## 2012-01-13 DIAGNOSIS — M545 Low back pain, unspecified: Secondary | ICD-10-CM | POA: Insufficient documentation

## 2012-01-15 ENCOUNTER — Ambulatory Visit: Payer: PRIVATE HEALTH INSURANCE | Admitting: Physical Therapy

## 2012-01-21 ENCOUNTER — Ambulatory Visit: Payer: PRIVATE HEALTH INSURANCE | Admitting: Physical Therapy

## 2012-01-24 ENCOUNTER — Ambulatory Visit: Payer: PRIVATE HEALTH INSURANCE | Admitting: Physical Therapy

## 2012-01-28 ENCOUNTER — Ambulatory Visit: Payer: PRIVATE HEALTH INSURANCE | Admitting: Physical Therapy

## 2012-01-29 ENCOUNTER — Other Ambulatory Visit: Payer: Self-pay | Admitting: Certified Registered Nurse Anesthetist

## 2012-01-29 NOTE — Telephone Encounter (Signed)
Pt. returning call from Tor Netters RN from yesterday. Upon further investigation, [facility] refill fax form found and note from Dr. Marton Redwood to have pt. Call her primary physician for future Alendronate Sodium medication refill.  Message given to pt. Pt. Voiced understanding. HL

## 2012-01-30 ENCOUNTER — Ambulatory Visit: Payer: PRIVATE HEALTH INSURANCE | Admitting: Physical Therapy

## 2012-01-31 ENCOUNTER — Encounter: Payer: PRIVATE HEALTH INSURANCE | Admitting: Physical Therapy

## 2012-02-03 ENCOUNTER — Ambulatory Visit: Payer: PRIVATE HEALTH INSURANCE | Admitting: Physical Therapy

## 2012-02-05 ENCOUNTER — Ambulatory Visit: Payer: PRIVATE HEALTH INSURANCE | Admitting: Physical Therapy

## 2012-03-17 DIAGNOSIS — M545 Low back pain, unspecified: Secondary | ICD-10-CM | POA: Diagnosis not present

## 2012-03-19 ENCOUNTER — Other Ambulatory Visit: Payer: Self-pay | Admitting: Nurse Practitioner

## 2012-03-19 ENCOUNTER — Encounter: Payer: Self-pay | Admitting: Nurse Practitioner

## 2012-03-19 MED ORDER — ANASTROZOLE 1 MG PO TABS: 1.0000 mg | ORAL_TABLET | Freq: Every day | ORAL | Status: DC

## 2012-03-19 NOTE — Progress Notes (Signed)
90 day prescription for Arimidex faxed to [facility] pharmacy per patient's request.

## 2012-03-30 DIAGNOSIS — H3530 Unspecified macular degeneration: Secondary | ICD-10-CM | POA: Diagnosis not present

## 2012-03-30 DIAGNOSIS — H4011X0 Primary open-angle glaucoma, stage unspecified: Secondary | ICD-10-CM | POA: Diagnosis not present

## 2012-06-08 DIAGNOSIS — C50919 Malignant neoplasm of unspecified site of unspecified female breast: Secondary | ICD-10-CM | POA: Diagnosis not present

## 2012-06-08 DIAGNOSIS — M899 Disorder of bone, unspecified: Secondary | ICD-10-CM | POA: Diagnosis not present

## 2012-06-08 DIAGNOSIS — B351 Tinea unguium: Secondary | ICD-10-CM | POA: Diagnosis not present

## 2012-06-08 DIAGNOSIS — I10 Essential (primary) hypertension: Secondary | ICD-10-CM | POA: Diagnosis not present

## 2012-06-12 DIAGNOSIS — M545 Low back pain, unspecified: Secondary | ICD-10-CM | POA: Diagnosis not present

## 2012-07-06 DIAGNOSIS — H3530 Unspecified macular degeneration: Secondary | ICD-10-CM | POA: Diagnosis not present

## 2012-07-06 DIAGNOSIS — H4011X0 Primary open-angle glaucoma, stage unspecified: Secondary | ICD-10-CM | POA: Diagnosis not present

## 2012-07-14 DIAGNOSIS — M2010 Hallux valgus (acquired), unspecified foot: Secondary | ICD-10-CM | POA: Diagnosis not present

## 2012-07-14 DIAGNOSIS — B351 Tinea unguium: Secondary | ICD-10-CM | POA: Diagnosis not present

## 2012-07-29 DIAGNOSIS — Z79899 Other long term (current) drug therapy: Secondary | ICD-10-CM | POA: Diagnosis not present

## 2012-08-07 ENCOUNTER — Telehealth: Payer: Self-pay | Admitting: Nurse Practitioner

## 2012-08-07 NOTE — Telephone Encounter (Signed)
Pt called requesting prescription faxed to [facility] stating: Please provide pt with mastectomy products.

## 2012-09-02 DIAGNOSIS — Z23 Encounter for immunization: Secondary | ICD-10-CM | POA: Diagnosis not present

## 2012-09-14 ENCOUNTER — Other Ambulatory Visit (HOSPITAL_BASED_OUTPATIENT_CLINIC_OR_DEPARTMENT_OTHER): Payer: Medicare Other

## 2012-09-14 DIAGNOSIS — C50919 Malignant neoplasm of unspecified site of unspecified female breast: Secondary | ICD-10-CM | POA: Diagnosis not present

## 2012-09-14 LAB — CBC WITH DIFFERENTIAL/PLATELET
BASO%: 0.4 % (ref 0.0–2.0)
Basophils Absolute: 0 10*3/uL (ref 0.0–0.1)
EOS%: 0.7 % (ref 0.0–7.0)
Eosinophils Absolute: 0 10*3/uL (ref 0.0–0.5)
HCT: 43 % (ref 34.8–46.6)
HGB: 14.7 g/dL (ref 11.6–15.9)
LYMPH%: 26.7 % (ref 14.0–49.7)
MCH: 32.4 pg (ref 25.1–34.0)
MCHC: 34.2 g/dL (ref 31.5–36.0)
MCV: 94.7 fL (ref 79.5–101.0)
MONO#: 0.3 10*3/uL (ref 0.1–0.9)
MONO%: 5.4 % (ref 0.0–14.0)
NEUT#: 3.6 10*3/uL (ref 1.5–6.5)
NEUT%: 66.8 % (ref 38.4–76.8)
Platelets: 210 10*3/uL (ref 145–400)
RBC: 4.54 10*6/uL (ref 3.70–5.45)
RDW: 14.2 % (ref 11.2–14.5)
WBC: 5.4 10*3/uL (ref 3.9–10.3)
lymph#: 1.4 10*3/uL (ref 0.9–3.3)

## 2012-09-14 LAB — COMPREHENSIVE METABOLIC PANEL (CC13)
ALT: 22 U/L (ref 0–55)
AST: 23 U/L (ref 5–34)
Albumin: 4.1 g/dL (ref 3.5–5.0)
Alkaline Phosphatase: 61 U/L (ref 40–150)
BUN: 18 mg/dL (ref 7.0–26.0)
CO2: 29 mEq/L (ref 22–29)
Calcium: 10.3 mg/dL (ref 8.4–10.4)
Chloride: 108 mEq/L — ABNORMAL HIGH (ref 98–107)
Creatinine: 0.8 mg/dL (ref 0.6–1.1)
Glucose: 80 mg/dl (ref 70–99)
Potassium: 4.2 mEq/L (ref 3.5–5.1)
Sodium: 142 mEq/L (ref 136–145)
Total Bilirubin: 0.59 mg/dL (ref 0.20–1.20)
Total Protein: 7.2 g/dL (ref 6.4–8.3)

## 2012-09-15 ENCOUNTER — Encounter (HOSPITAL_COMMUNITY): Payer: Self-pay | Admitting: *Deleted

## 2012-09-15 ENCOUNTER — Emergency Department (HOSPITAL_COMMUNITY): Payer: Medicare Other

## 2012-09-15 ENCOUNTER — Other Ambulatory Visit: Payer: Self-pay

## 2012-09-15 ENCOUNTER — Telehealth: Payer: Self-pay | Admitting: *Deleted

## 2012-09-15 ENCOUNTER — Observation Stay (HOSPITAL_COMMUNITY)
Admission: EM | Admit: 2012-09-15 | Discharge: 2012-09-16 | Disposition: A | Discharge status: Home / Self Care | Payer: Medicare Other | Attending: Internal Medicine | Admitting: Internal Medicine

## 2012-09-15 DIAGNOSIS — Z9010 Acquired absence of unspecified breast and nipple: Secondary | ICD-10-CM | POA: Diagnosis not present

## 2012-09-15 DIAGNOSIS — C50919 Malignant neoplasm of unspecified site of unspecified female breast: Secondary | ICD-10-CM | POA: Diagnosis not present

## 2012-09-15 DIAGNOSIS — I10 Essential (primary) hypertension: Secondary | ICD-10-CM | POA: Diagnosis not present

## 2012-09-15 DIAGNOSIS — R55 Syncope and collapse: Secondary | ICD-10-CM | POA: Diagnosis not present

## 2012-09-15 DIAGNOSIS — Y9239 Other specified sports and athletic area as the place of occurrence of the external cause: Secondary | ICD-10-CM | POA: Insufficient documentation

## 2012-09-15 DIAGNOSIS — Z792 Long term (current) use of antibiotics: Secondary | ICD-10-CM | POA: Insufficient documentation

## 2012-09-15 DIAGNOSIS — M1990 Unspecified osteoarthritis, unspecified site: Secondary | ICD-10-CM | POA: Insufficient documentation

## 2012-09-15 DIAGNOSIS — M549 Dorsalgia, unspecified: Secondary | ICD-10-CM | POA: Diagnosis not present

## 2012-09-15 DIAGNOSIS — W19XXXA Unspecified fall, initial encounter: Secondary | ICD-10-CM | POA: Insufficient documentation

## 2012-09-15 DIAGNOSIS — S0993XA Unspecified injury of face, initial encounter: Secondary | ICD-10-CM | POA: Diagnosis not present

## 2012-09-15 DIAGNOSIS — C801 Malignant (primary) neoplasm, unspecified: Secondary | ICD-10-CM

## 2012-09-15 DIAGNOSIS — G8929 Other chronic pain: Secondary | ICD-10-CM | POA: Diagnosis not present

## 2012-09-15 DIAGNOSIS — Z23 Encounter for immunization: Secondary | ICD-10-CM | POA: Diagnosis not present

## 2012-09-15 DIAGNOSIS — E876 Hypokalemia: Secondary | ICD-10-CM

## 2012-09-15 DIAGNOSIS — N390 Urinary tract infection, site not specified: Secondary | ICD-10-CM | POA: Diagnosis present

## 2012-09-15 DIAGNOSIS — S0180XA Unspecified open wound of other part of head, initial encounter: Secondary | ICD-10-CM | POA: Diagnosis not present

## 2012-09-15 DIAGNOSIS — Z853 Personal history of malignant neoplasm of breast: Secondary | ICD-10-CM | POA: Diagnosis not present

## 2012-09-15 DIAGNOSIS — S0990XA Unspecified injury of head, initial encounter: Secondary | ICD-10-CM | POA: Diagnosis not present

## 2012-09-15 DIAGNOSIS — Y93E1 Activity, personal bathing and showering: Secondary | ICD-10-CM | POA: Insufficient documentation

## 2012-09-15 DIAGNOSIS — S199XXA Unspecified injury of neck, initial encounter: Secondary | ICD-10-CM | POA: Diagnosis not present

## 2012-09-15 HISTORY — DX: Syncope and collapse: R55

## 2012-09-15 HISTORY — DX: Hypokalemia: E87.6

## 2012-09-15 LAB — CBC WITH DIFFERENTIAL/PLATELET
Basophils Absolute: 0 10*3/uL (ref 0.0–0.1)
Basophils Relative: 0 % (ref 0–1)
Eosinophils Absolute: 0 10*3/uL (ref 0.0–0.7)
Eosinophils Relative: 0 % (ref 0–5)
HCT: 44.2 % (ref 36.0–46.0)
Hemoglobin: 15.6 g/dL — ABNORMAL HIGH (ref 12.0–15.0)
Lymphocytes Relative: 11 % — ABNORMAL LOW (ref 12–46)
Lymphs Abs: 1.4 10*3/uL (ref 0.7–4.0)
MCH: 33 pg (ref 26.0–34.0)
MCHC: 35.3 g/dL (ref 30.0–36.0)
MCV: 93.4 fL (ref 78.0–100.0)
Monocytes Absolute: 0.5 10*3/uL (ref 0.1–1.0)
Monocytes Relative: 4 % (ref 3–12)
Neutro Abs: 10.3 10*3/uL — ABNORMAL HIGH (ref 1.7–7.7)
Neutrophils Relative %: 84 % — ABNORMAL HIGH (ref 43–77)
Platelets: 205 10*3/uL (ref 150–400)
RBC: 4.73 MIL/uL (ref 3.87–5.11)
RDW: 13.7 % (ref 11.5–15.5)
WBC: 12.2 10*3/uL — ABNORMAL HIGH (ref 4.0–10.5)

## 2012-09-15 LAB — POCT I-STAT TROPONIN I: Troponin i, poc: 0.04 ng/mL (ref 0.00–0.08)

## 2012-09-15 LAB — URINALYSIS, ROUTINE W REFLEX MICROSCOPIC
Bilirubin Urine: NEGATIVE
Glucose, UA: NEGATIVE mg/dL
Hgb urine dipstick: NEGATIVE
Ketones, ur: NEGATIVE mg/dL
Leukocytes, UA: NEGATIVE
Nitrite: NEGATIVE
Protein, ur: NEGATIVE mg/dL
Specific Gravity, Urine: 1.009 (ref 1.005–1.030)
Urobilinogen, UA: 0.2 mg/dL (ref 0.0–1.0)
pH: 7.5 (ref 5.0–8.0)

## 2012-09-15 LAB — BASIC METABOLIC PANEL
BUN: 15 mg/dL (ref 6–23)
CO2: 27 mEq/L (ref 19–32)
Calcium: 10 mg/dL (ref 8.4–10.5)
Chloride: 99 mEq/L (ref 96–112)
Creatinine, Ser: 0.62 mg/dL (ref 0.50–1.10)
GFR calc Af Amer: >90 mL/min (ref >90)
GFR calc non Af Amer: >90 mL/min (ref >90)
Glucose, Bld: 95 mg/dL (ref 70–99)
Potassium: 3 mEq/L — ABNORMAL LOW (ref 3.5–5.1)
Sodium: 136 mEq/L (ref 135–145)

## 2012-09-15 LAB — CANCER ANTIGEN 27.29: CA 27.29: 23 U/mL (ref 0–39)

## 2012-09-15 LAB — CK: Total CK: 153 U/L (ref 7–177)

## 2012-09-15 LAB — TROPONIN I
Troponin I: <0.3 ng/mL (ref <0.30)
Troponin I: <0.3 ng/mL (ref <0.30)
Troponin I: <0.3 ng/mL (ref <0.30)

## 2012-09-15 LAB — GLUCOSE, CAPILLARY: Glucose-Capillary: 88 mg/dL (ref 70–99)

## 2012-09-15 LAB — MAGNESIUM: Magnesium: 2.2 mg/dL (ref 1.5–2.5)

## 2012-09-15 MED ORDER — THERA M PLUS PO TABS: 1.0000 | ORAL_TABLET | Freq: Every day | ORAL | Status: DC

## 2012-09-15 MED ORDER — POTASSIUM CHLORIDE CRYS ER 20 MEQ PO TBCR
40.0000 meq | EXTENDED_RELEASE_TABLET | Freq: Once | ORAL | Status: AC
  Administered 2012-09-15: 40 meq via ORAL
  Filled 2012-09-15 (×2): qty 1

## 2012-09-15 MED ORDER — ADULT MULTIVITAMIN W/MINERALS CH
1.0000 | ORAL_TABLET | Freq: Every day | ORAL | Status: DC
  Filled 2012-09-15: qty 1

## 2012-09-15 MED ORDER — CHOLECALCIFEROL 10 MCG (400 UNIT) PO TABS
200.0000 [IU] | ORAL_TABLET | Freq: Two times a day (BID) | ORAL | Status: DC
  Administered 2012-09-15: 200 [IU] via ORAL
  Filled 2012-09-15 (×3): qty 1

## 2012-09-15 MED ORDER — LATANOPROST 0.005 % OP SOLN
1.0000 [drp] | Freq: Every day | OPHTHALMIC | Status: DC
  Administered 2012-09-15: 1 [drp] via OPHTHALMIC
  Filled 2012-09-15: qty 2.5

## 2012-09-15 MED ORDER — SODIUM CHLORIDE 0.9 % IV SOLN
INTRAVENOUS | Status: DC
  Administered 2012-09-15: 16:00:00 via INTRAVENOUS

## 2012-09-15 MED ORDER — ONDANSETRON HCL 4 MG/2ML IJ SOLN: 4.0000 mg | Freq: Four times a day (QID) | INTRAMUSCULAR | Status: DC | PRN

## 2012-09-15 MED ORDER — DOCUSATE SODIUM 100 MG PO CAPS
100.0000 mg | ORAL_CAPSULE | Freq: Two times a day (BID) | ORAL | Status: DC
  Administered 2012-09-15: 100 mg via ORAL
  Filled 2012-09-15 (×2): qty 1

## 2012-09-15 MED ORDER — CALCIUM CITRATE 950 (200 CA) MG PO TABS
200.0000 mg | ORAL_TABLET | Freq: Two times a day (BID) | ORAL | Status: DC
  Administered 2012-09-15: 200 mg via ORAL
  Filled 2012-09-15 (×3): qty 1

## 2012-09-15 MED ORDER — HYDROCODONE-ACETAMINOPHEN 5-325 MG PO TABS
1.0000 | ORAL_TABLET | ORAL | Status: DC | PRN
  Administered 2012-09-15: 2 via ORAL
  Filled 2012-09-15: qty 2

## 2012-09-15 MED ORDER — SODIUM CHLORIDE 0.9 % IJ SOLN
3.0000 mL | Freq: Two times a day (BID) | INTRAMUSCULAR | Status: DC
  Administered 2012-09-15: 16:00:00 via INTRAVENOUS

## 2012-09-15 MED ORDER — ACETAMINOPHEN 325 MG PO TABS: 650.0000 mg | ORAL_TABLET | Freq: Four times a day (QID) | ORAL | Status: DC | PRN

## 2012-09-15 MED ORDER — POTASSIUM CHLORIDE CRYS ER 20 MEQ PO TBCR
40.0000 meq | EXTENDED_RELEASE_TABLET | Freq: Once | ORAL | Status: AC
  Administered 2012-09-15: 40 meq via ORAL
  Filled 2012-09-15: qty 2

## 2012-09-15 MED ORDER — ACETAMINOPHEN 650 MG RE SUPP: 650.0000 mg | Freq: Four times a day (QID) | RECTAL | Status: DC | PRN

## 2012-09-15 MED ORDER — ONDANSETRON HCL 4 MG PO TABS: 4.0000 mg | ORAL_TABLET | Freq: Four times a day (QID) | ORAL | Status: DC | PRN

## 2012-09-15 MED ORDER — ALUM & MAG HYDROXIDE-SIMETH 200-200-20 MG/5ML PO SUSP: 30.0000 mL | Freq: Four times a day (QID) | ORAL | Status: DC | PRN

## 2012-09-15 MED ORDER — CALCIUM CITRATE-VITAMIN D 315-200 MG-UNIT PO TABS: 1.0000 | ORAL_TABLET | Freq: Two times a day (BID) | ORAL | Status: DC

## 2012-09-15 NOTE — ED Provider Notes (Signed)
Medical screening examination/treatment/procedure(s) were performed by non-physician practitioner and as supervising physician I was immediately available for consultation/collaboration.   Charles B. Bernette Mayers, MD 09/15/12 1401

## 2012-09-15 NOTE — H&P (Signed)
Patient seen and examined by me.  Agree with plan to observe overnight with tele and CE.  Only current complaint is headache.  No fever, no chills.  No current dizziness.  LM for Dr. Earl Gala to pick up in the AM.  Marlin Canary DO

## 2012-09-15 NOTE — ED Notes (Signed)
Pt was in shower at gym and passed out and hit left side of head.  Pt states she felt dizzy prior to falling and is not diabetic

## 2012-09-15 NOTE — H&P (Signed)
[facility] History and Physical  Barbara Rangel QIO:[id number] DOB: 10/15/1946 DOA: 09/15/2012  Referring physician:  PCP: Darnelle Bos, MD  Specialists:   Chief Complaint: syncope  HPI: Barbara Rangel is a very pleasant  66 y.o. female with past medical history including HTN, remote breast cancer s/p mastectomy and chronic back pain s/p back surgery, chronic UTI presents to ED with cc syncope. Information provided by patient. States she has been in her usual state of health until this am, after a water aerobics class, she sat in hot tub for about 10 minutes and went to shower. While in the shower she became dizzy and sat on the stool in shower. Her next memory is waking up on the floor in small pool blood. Reports getting up off floor and going to locker room to clean her face. Denies any difficulty getting up off floor, Denies any dizziness/visual disturbances, numbness/tingling of extremities . She then got dressed and called her husband to pick her up as she did not want to drive. Denies CP, palpitation, sob, headache, nausea/vomiting, abdominal pain. No recent illness, fever, chills. No recent change in any of her meds. She reports an unintentional wt loss of 8lbs over last 6 months. She had lab work drawn yesterday in anticipation of routine visit to oncologist. Symptoms came on suddenly, have resolved. Characterized as severe.  In ED CT of neck, maxillofacial, head all negative. Chest xray without acute process. We are asked to admit for further evaluation and treatment  Review of Systems: The patient denies anorexia, fever,  vision loss, decreased hearing, hoarseness, chest pain,  dyspnea on exertion, peripheral edema, balance deficits, hemoptysis, abdominal pain, melena, hematochezia, severe indigestion/heartburn, hematuria, incontinence, genital sores, muscle weakness, suspicious skin lesions, transient blindness, difficulty walking, depression, unusual weight change,  abnormal bleeding, enlarged lymph nodes, angioedema, and breast masses.   Past Medical History  Diagnosis Date  . PONV (postoperative nausea and vomiting)   . Other and unspecified general anesthetics causing adverse effect in therapeutic use     hard to wake up  . Chronic back pain     right radicular leg pain  . Constipation     r/t pain meds and takes Dulcolax nightly  . FHx: colonic polyps     hx of and mother had colon cancer  . Chronic urinary tract infection     takes Macrodantin and Cranberry daily  . Glaucoma(365)     uses Xalantan nightly  . Cancer     hx breast cancer-right  . Hypertension     takes Amlodipine daily  . Arthritis     degenerative arthritis   Past Surgical History  Procedure Date  . Tubal ligation 1980  . Eye surgery 2004    right eye-detached retina  . Cataract surgery 2005    right eye  . Right breast biopsy 2005  . Breast lumpectomy     x2  . Left breast biopsy 2009  . Ankle surgery 2011    left ankle with screws and plates  . Colonoscopy   . Retinal detachment surgery 2004  . Mastectomy 2005    right  d/t breast cancer;no sticks to right arm LYMPH NODES REMOVED.  . Lumbar laminectomy/decompression microdiscectomy 10/31/2011    Procedure: LUMBAR LAMINECTOMY/DECOMPRESSION MICRODISCECTOMY;  Surgeon: Alvy Beal;  Location: [facility] OR;  Service: Orthopedics;  Laterality: Right;  L4-5 Decompression with Right Facet Decompression    Social History:  reports that she has never smoked. She does not have  any smokeless tobacco history on file. She reports that she does not drink alcohol or use illicit drugs. Pt lives with husband and is independent with ADL's Allergies  Allergen Reactions  . Avelox (Moxifloxacin Hcl In Nacl) Other (See Comments)    Severe headache   . Tramadol Nausea Only and Other (See Comments)    Nausea and Dizziness, too.  . Trimethoprim Other (See Comments)    Rapid heartbeat.  . Citalopram Hydrobromide     Pt can't  remember what the side effect was.  . Cephalexin Other (See Comments)    Causes headache    Family History  Problem Relation Age of Onset  . Anesthesia problems Neg Hx   . Hypotension Neg Hx   . Malignant hyperthermia Neg Hx   . Pseudochol deficiency Neg Hx      Prior to Admission medications   Medication Sig Start Date End Date Taking? Authorizing Provider  alendronate (FOSAMAX) 70 MG tablet Take 1 tablet (70 mg total) by mouth every 7 (seven) days. Hold while in hospital. Thursday.  Take with a full glass of water on an empty stomach. 11/01/11  Yes Lauretta I Odogwu, MD  amLODipine (NORVASC) 5 MG tablet Take 5 mg by mouth daily.     Yes Historical Provider, MD  anastrozole (ARIMIDEX) 1 MG tablet Take 1 tablet (1 mg total) by mouth daily. 03/19/12  Yes Laurice Record, MD  calcium citrate-vitamin D (CITRACAL+D) 315-200 MG-UNIT per tablet Take 1 tablet by mouth 2 (two) times daily.    Yes Historical Provider, MD  DIPHENHYDRAMINE HCL PO Take 1 capsule by mouth at bedtime.   Yes Historical Provider, MD  docusate sodium (COLACE) 100 MG capsule Take 100 mg by mouth 2 (two) times daily.     Yes Historical Provider, MD  Ibuprofen-Diphenhydramine Cit (ADVIL PM PO) Take 2 tablets by mouth at bedtime.   Yes Historical Provider, MD  latanoprost (XALATAN) 0.005 % ophthalmic solution Place 1 drop into both eyes at bedtime.     Yes Historical Provider, MD  loratadine (CLARITIN REDITABS) 10 MG dissolvable tablet Take 10 mg by mouth as needed. For allergies   Yes Historical Provider, MD  Multiple Vitamins-Minerals (MULTIVITAMINS THER. W/MINERALS) TABS Take 1 tablet by mouth daily.     Yes Historical Provider, MD  nitrofurantoin (MACRODANTIN) 100 MG capsule Take 100 mg by mouth daily.     Yes Historical Provider, MD  omeprazole (PRILOSEC) 40 MG capsule Take 40 mg by mouth daily.   Yes Historical Provider, MD  OVER THE COUNTER MEDICATION Take 1 capsule by mouth daily. ellura- cranberry extract   Yes  Historical Provider, MD   Physical Exam: Filed Vitals:   09/15/12 1130 09/15/12 1330 09/15/12 1400 09/15/12 1430  BP:   123/73 124/66  Pulse: 83 98 91 82  Temp:      TempSrc:      Resp: 10 21 9 11   SpO2: 98% 99% 100% 100%     General:  Awake alert oriented x3  Eyes: PERRL EOMI no scleral icterus  ENT: mucus membranes of mouth moist/pink. Ears clear, no drainage from nose   Neck: supple full rom. No JVD  Cardiovascular: RRR No MGR No LEE   Respiratory: normal effort BSCTAB No wheeze,rhonci  Abdomen: flat soft +BS non-tender to palpation  Skin: warm/dry no rash/lesion  Musculoskeletal: MOE no Joint swelling/erythema  Psychiatric: cooperative appropriate  Neuro: cranial nerve Ii-XII intact speech clear facial symmetry  Labs on Admission:  Basic Metabolic Panel:  Lab 09/15/12 1400 09/15/12 1050 09/14/12 1009  NA -- 136 142  K -- 3.0* 4.2  CL -- 99 108*  CO2 -- 27 29  GLUCOSE -- 95 80  BUN -- 15 18.0  CREATININE -- 0.62 0.8  CALCIUM -- 10.0 10.3  MG 2.2 -- --  PHOS -- -- --   Liver Function Tests:  Lab 09/14/12 1009  AST 23  ALT 22  ALKPHOS 61  BILITOT 0.59  PROT 7.2  ALBUMIN 4.1   No results found for this basename: LIPASE:5,AMYLASE:5 in the last 168 hours No results found for this basename: AMMONIA:5 in the last 168 hours CBC:  Lab 09/15/12 1050 09/14/12 1009  WBC 12.2* 5.4  NEUTROABS 10.3* 3.6  HGB 15.6* 14.7  HCT 44.2 43.0  MCV 93.4 94.7  PLT 205 210   Cardiac Enzymes:  Lab 09/15/12 1400 09/15/12 1125  CKTOTAL 153 --  CKMB -- --  CKMBINDEX -- --  TROPONINI -- <0.30    BNP (last 3 results) No results found for this basename: PROBNP:3 in the last 8760 hours CBG: No results found for this basename: GLUCAP:5 in the last 168 hours  Radiological Exams on Admission: Dg Chest 2 View  09/15/2012  *RADIOLOGY REPORT*  Clinical Data: Syncope  CHEST - 2 VIEW  Comparison: None.  Findings: The heart and pulmonary vascularity are within normal  limits.  The lungs are clear bilaterally.  No focal bony abnormality is noted.  IMPRESSION: No acute intrathoracic abnormality.   Original Report Authenticated By: Alcide Clever, M.D.    Ct Head Wo Contrast  09/15/2012  *RADIOLOGY REPORT*  Clinical Data: Fall, head injury  CT MAXILLOFACIAL WITHOUT CONTRAST,CT HEAD WITHOUT CONTRAST,CT CERVICAL SPINE WITHOUT CONTRAST  Technique:  Multidetector CT imaging of the maxillofacial structures was performed. Multiplanar CT image reconstructions were also generated.,Technique:  Contiguous axial images were obtained from the base of the skull through the vertex without contrast.  Comparison: None.  CT head without IV contrast:  Findings: No skull fracture is noted.  Paranasal sinuses are unremarkable.  The mastoid air cells are unremarkable.  No intracranial hemorrhage, mass effect or midline shift.  No acute infarction.  No mass lesion is noted on this unenhanced scan.  IMPRESSION: No acute intracranial abnormality.  CT cervical spine without IV contrast:  Findings:  Axial images of the cervical spine shows no acute fracture or subluxation.  Sagittal images shows no acute fracture or subluxation.  Degenerative changes are noted C1-C2 articulation. Mild disc space flattening with mild posterior spurring at C3-C4 level.  There is minimal anterolisthesis about 1.5 mm C3 on C4 vertebral body.  Mild disc space flattening with mild posterior disc bulge at C4-C5 level.  Mild disc space flattening at C5-C6 level.  Moderate disc space flattening with mild anterior and posterior spurring at C6-C7 level.  No prevertebral soft tissue swelling.  Cervical airway is patent.  Coronal images shows no acute fracture or subluxation.  Impression: 1.  No acute fracture or subluxation.  Degenerative changes as described above.  There is mild anterolisthesis about 1.5 mm C3 on C4 vertebral body.  CT maxillofacial without IV contrast  Findings:  Axial images shows no facial fractures.  No paranasal  sinuses air fluid levels are noted.  Metallic dental artifacts are noted.  No nasal bone fracture.  No zygomatic fracture.  No orbital hematoma.  Bilateral eye globe is symmetrical in appearance.  Coronal reconstructed images shows no orbital rim or orbital floor fracture.  Mild left  nasal septum deviation.  There is concha bullosa deformity of the right middle turbinate.  No mandibular fracture is noted.  No TMJ dislocation.  Sagittal images shows patent nasopharyngeal and oral pharyngeal airway.  No retropharyngeal soft tissue swelling.  Degenerative changes C1-C2 articulation are noted.  Impression: 1.  No facial fracture or facial fluid collection. 2.  No orbital rim or orbital floor fracture. 3.  No mandibular fracture.  No TMJ dislocation.   Original Report Authenticated By: Natasha Mead, M.D.    Ct Cervical Spine Wo Contrast  09/15/2012  *RADIOLOGY REPORT*  Clinical Data: Fall, head injury  CT MAXILLOFACIAL WITHOUT CONTRAST,CT HEAD WITHOUT CONTRAST,CT CERVICAL SPINE WITHOUT CONTRAST  Technique:  Multidetector CT imaging of the maxillofacial structures was performed. Multiplanar CT image reconstructions were also generated.,Technique:  Contiguous axial images were obtained from the base of the skull through the vertex without contrast.  Comparison: None.  CT head without IV contrast:  Findings: No skull fracture is noted.  Paranasal sinuses are unremarkable.  The mastoid air cells are unremarkable.  No intracranial hemorrhage, mass effect or midline shift.  No acute infarction.  No mass lesion is noted on this unenhanced scan.  IMPRESSION: No acute intracranial abnormality.  CT cervical spine without IV contrast:  Findings:  Axial images of the cervical spine shows no acute fracture or subluxation.  Sagittal images shows no acute fracture or subluxation.  Degenerative changes are noted C1-C2 articulation. Mild disc space flattening with mild posterior spurring at C3-C4 level.  There is minimal anterolisthesis  about 1.5 mm C3 on C4 vertebral body.  Mild disc space flattening with mild posterior disc bulge at C4-C5 level.  Mild disc space flattening at C5-C6 level.  Moderate disc space flattening with mild anterior and posterior spurring at C6-C7 level.  No prevertebral soft tissue swelling.  Cervical airway is patent.  Coronal images shows no acute fracture or subluxation.  Impression: 1.  No acute fracture or subluxation.  Degenerative changes as described above.  There is mild anterolisthesis about 1.5 mm C3 on C4 vertebral body.  CT maxillofacial without IV contrast  Findings:  Axial images shows no facial fractures.  No paranasal sinuses air fluid levels are noted.  Metallic dental artifacts are noted.  No nasal bone fracture.  No zygomatic fracture.  No orbital hematoma.  Bilateral eye globe is symmetrical in appearance.  Coronal reconstructed images shows no orbital rim or orbital floor fracture.  Mild left nasal septum deviation.  There is concha bullosa deformity of the right middle turbinate.  No mandibular fracture is noted.  No TMJ dislocation.  Sagittal images shows patent nasopharyngeal and oral pharyngeal airway.  No retropharyngeal soft tissue swelling.  Degenerative changes C1-C2 articulation are noted.  Impression: 1.  No facial fracture or facial fluid collection. 2.  No orbital rim or orbital floor fracture. 3.  No mandibular fracture.  No TMJ dislocation.   Original Report Authenticated By: Natasha Mead, M.D.    Ct Maxillofacial Wo Cm  09/15/2012  *RADIOLOGY REPORT*  Clinical Data: Fall, head injury  CT MAXILLOFACIAL WITHOUT CONTRAST,CT HEAD WITHOUT CONTRAST,CT CERVICAL SPINE WITHOUT CONTRAST  Technique:  Multidetector CT imaging of the maxillofacial structures was performed. Multiplanar CT image reconstructions were also generated.,Technique:  Contiguous axial images were obtained from the base of the skull through the vertex without contrast.  Comparison: None.  CT head without IV contrast:   Findings: No skull fracture is noted.  Paranasal sinuses are unremarkable.  The mastoid air cells are  unremarkable.  No intracranial hemorrhage, mass effect or midline shift.  No acute infarction.  No mass lesion is noted on this unenhanced scan.  IMPRESSION: No acute intracranial abnormality.  CT cervical spine without IV contrast:  Findings:  Axial images of the cervical spine shows no acute fracture or subluxation.  Sagittal images shows no acute fracture or subluxation.  Degenerative changes are noted C1-C2 articulation. Mild disc space flattening with mild posterior spurring at C3-C4 level.  There is minimal anterolisthesis about 1.5 mm C3 on C4 vertebral body.  Mild disc space flattening with mild posterior disc bulge at C4-C5 level.  Mild disc space flattening at C5-C6 level.  Moderate disc space flattening with mild anterior and posterior spurring at C6-C7 level.  No prevertebral soft tissue swelling.  Cervical airway is patent.  Coronal images shows no acute fracture or subluxation.  Impression: 1.  No acute fracture or subluxation.  Degenerative changes as described above.  There is mild anterolisthesis about 1.5 mm C3 on C4 vertebral body.  CT maxillofacial without IV contrast  Findings:  Axial images shows no facial fractures.  No paranasal sinuses air fluid levels are noted.  Metallic dental artifacts are noted.  No nasal bone fracture.  No zygomatic fracture.  No orbital hematoma.  Bilateral eye globe is symmetrical in appearance.  Coronal reconstructed images shows no orbital rim or orbital floor fracture.  Mild left nasal septum deviation.  There is concha bullosa deformity of the right middle turbinate.  No mandibular fracture is noted.  No TMJ dislocation.  Sagittal images shows patent nasopharyngeal and oral pharyngeal airway.  No retropharyngeal soft tissue swelling.  Degenerative changes C1-C2 articulation are noted.  Impression: 1.  No facial fracture or facial fluid collection. 2.  No orbital  rim or orbital floor fracture. 3.  No mandibular fracture.  No TMJ dislocation.   Original Report Authenticated By: Natasha Mead, M.D.     EKG: Independently reviewed.   Assessment/Plan Principal Problem:  *Syncope: will admit to tele. Suspect may be due to orthostasis and/or dehydration after exercise and hot tub. Will check orthostatic VS daily. Will evaluate meds and hold any antihypertensives. Will gently hydrate with IV fluid. Will get serial troponins, 2decho and TSH for completeness.  Active Problems: Leukocytosis: likely reactive. Pt is afebrile non-toxic appearing. Chest xray neg, urinalysis clean. Monitor   Chronic back pain: at baseline.   Chronic urinary tract infection: urinalysis clean. Will hold macrodantin that she take daily for now.   Hypertension: controlled. SBP range 123-146. Holding norvasc for now  Hypokalemia: will replete and recheck    Code Status: full Family Communication: husband at bedside Disposition Plan: home when stabel  Time spent: 40 minutes  Gwenyth Bender  NP [facility]   If 7PM-7AM, please contact night-coverage [email or web address] Password [email or web address] 09/15/2012, 2:52 PM

## 2012-09-15 NOTE — ED Notes (Signed)
Hospitalist at bedside 

## 2012-09-15 NOTE — ED Notes (Signed)
POCT CBG resulted 88; Lupita Leash, RN notified

## 2012-09-15 NOTE — Progress Notes (Signed)
Received from ED to unit 2000; room 2025. Assessed per flow sheet. Denies chest pain, dizziness, or shortness of breath. VSS; orthostatics obtained. Call bell near. Barbara Rangel

## 2012-09-15 NOTE — Telephone Encounter (Signed)
Received message from pt informing nurse re:   Pt would like to cancel f/u appt on 09/16/12 due to pt is in the hospital now.   Message to md.

## 2012-09-15 NOTE — ED Provider Notes (Signed)
History     CSN: [id number]  Arrival date & time 09/15/12  1038   First MD Initiated Contact with Patient 09/15/12 1103      Chief Complaint  Patient presents with  . Loss of Consciousness    (Consider location/radiation/quality/duration/timing/severity/associated sxs/prior treatment) HPI  The emergency department for evaluation of syncopal episode. She had been at the gym doing a pool workouts this morning when she decided to take a shower afterwards while in the shower she began to feel dizzy and sat down in a chair. From that point she woke up on the shower floor. Next her syncopal episode. Patient feels do to the time difference that she was unconscious for 10-15 minutes before. Once she was awake she was able to ask for help. She fell out of the chair hitting her head on her left eyebrow. She is small laceration there she is also having left jaw pain. Any other prodromal for syncopal episode. Not complaining of headache, neck pain, nausea, vomiting, diarrhea. nad vss A&O x 3  Past Medical History  Diagnosis Date  . PONV (postoperative nausea and vomiting)   . Other and unspecified general anesthetics causing adverse effect in therapeutic use     hard to wake up  . Chronic back pain     right radicular leg pain  . Constipation     r/t pain meds and takes Dulcolax nightly  . FHx: colonic polyps     hx of and mother had colon cancer  . Chronic urinary tract infection     takes Macrodantin and Cranberry daily  . Glaucoma(365)     uses Xalantan nightly  . Cancer     hx breast cancer-right  . Hypertension     takes Amlodipine daily  . Arthritis     degenerative arthritis    Past Surgical History  Procedure Date  . Tubal ligation 1980  . Eye surgery 2004    right eye-detached retina  . Cataract surgery 2005    right eye  . Right breast biopsy 2005  . Breast lumpectomy     x2  . Left breast biopsy 2009  . Ankle surgery 2011    left ankle with screws and plates  .  Colonoscopy   . Retinal detachment surgery 2004  . Mastectomy 2005    right  d/t breast cancer;no sticks to right arm LYMPH NODES REMOVED.  . Lumbar laminectomy/decompression microdiscectomy 10/31/2011    Procedure: LUMBAR LAMINECTOMY/DECOMPRESSION MICRODISCECTOMY;  Surgeon: Alvy Beal;  Location: [facility] OR;  Service: Orthopedics;  Laterality: Right;  L4-5 Decompression with Right Facet Decompression     Family History  Problem Relation Age of Onset  . Anesthesia problems Neg Hx   . Hypotension Neg Hx   . Malignant hyperthermia Neg Hx   . Pseudochol deficiency Neg Hx     History  Substance Use Topics  . Smoking status: Never Smoker   . Smokeless tobacco: Not on file  . Alcohol Use: No    OB History    Grav Para Term Preterm Abortions TAB SAB Ect Mult Living                  Review of Systems   Review of Systems  Gen: no weight loss, fevers, chills, night sweats  Eyes: no discharge or drainage, no occular pain or visual changes  Nose: no epistaxis or rhinorrhea  Mouth: no dental pain, no sore throat  Neck: no neck pain  Lungs:No wheezing, coughing or  hemoptysis CV: no chest pain, palpitations, dependent edema or orthopnea  Abd: no abdominal pain, nausea, vomiting  GU: no dysuria or gross hematuria  MSK:  No abnormalities, facial laceration Neuro: no headache, no focal neurologic deficits, loc Skin: no abnormalities Psyche: negative.    Allergies  Avelox; Tramadol; Trimethoprim; Citalopram hydrobromide; and Cephalexin  Home Medications   Current Outpatient Rx  Name  Route  Sig  Dispense  Refill  . ALENDRONATE SODIUM 70 MG PO TABS   Oral   Take 1 tablet (70 mg total) by mouth every 7 (seven) days. Hold while in hospital. Thursday.  Take with a full glass of water on an empty stomach.   4 tablet   2   . AMLODIPINE BESYLATE 5 MG PO TABS   Oral   Take 5 mg by mouth daily.           . ANASTROZOLE 1 MG PO TABS   Oral   Take 1 tablet (1 mg total) by mouth  daily.   90 tablet   3   . CALCIUM CITRATE-VITAMIN D 315-200 MG-UNIT PO TABS   Oral   Take 1 tablet by mouth 2 (two) times daily.          Marland Kitchen DIPHENHYDRAMINE HCL PO   Oral   Take 1 capsule by mouth at bedtime.         Marland Kitchen DOCUSATE SODIUM 100 MG PO CAPS   Oral   Take 100 mg by mouth 2 (two) times daily.           Marland Kitchen ADVIL PM PO   Oral   Take 2 tablets by mouth at bedtime.         Marland Kitchen LATANOPROST 0.005 % OP SOLN   Both Eyes   Place 1 drop into both eyes at bedtime.           Marland Kitchen LORATADINE 10 MG PO TBDP   Oral   Take 10 mg by mouth as needed. For allergies         . THERA M PLUS PO TABS   Oral   Take 1 tablet by mouth daily.           Marland Kitchen NITROFURANTOIN MACROCRYSTAL 100 MG PO CAPS   Oral   Take 100 mg by mouth daily.           Marland Kitchen OMEPRAZOLE 40 MG PO CPDR   Oral   Take 40 mg by mouth daily.         Marland Kitchen OVER THE COUNTER MEDICATION   Oral   Take 1 capsule by mouth daily. ellura- cranberry extract           BP 146/62  Pulse 83  Temp 98 F (36.7 C) (Oral)  Resp 10  SpO2 98%  Physical Exam  Nursing note and vitals reviewed. Constitutional: She appears well-developed and well-nourished. No distress.  HENT:  Head: Normocephalic. Head is with contusion and with laceration. Head is without raccoon's eyes, without Battle's sign, without right periorbital erythema and without left periorbital erythema.    Eyes: Pupils are equal, round, and reactive to light.  Neck: Normal range of motion. Neck supple. No spinous process tenderness and no muscular tenderness present. No rigidity. Normal range of motion present.  Cardiovascular: Normal rate and regular rhythm.   Pulmonary/Chest: Effort normal.  Abdominal: Soft.  Neurological: She is alert.  Skin: Skin is warm and dry.    ED Course  Procedures (including critical care time)  Labs  Reviewed  BASIC METABOLIC PANEL - Abnormal; Notable for the following:    Potassium 3.0 (*)     All other components within  normal limits  CBC WITH DIFFERENTIAL - Abnormal; Notable for the following:    WBC 12.2 (*)     Hemoglobin 15.6 (*)     Neutrophils Relative 84 (*)     Neutro Abs 10.3 (*)     Lymphocytes Relative 11 (*)     All other components within normal limits  TROPONIN I  POCT I-STAT TROPONIN I  URINALYSIS, ROUTINE W REFLEX MICROSCOPIC  CK  MAGNESIUM   Dg Chest 2 View  09/15/2012  *RADIOLOGY REPORT*  Clinical Data: Syncope  CHEST - 2 VIEW  Comparison: None.  Findings: The heart and pulmonary vascularity are within normal limits.  The lungs are clear bilaterally.  No focal bony abnormality is noted.  IMPRESSION: No acute intrathoracic abnormality.   Original Report Authenticated By: Alcide Clever, M.D.    Ct Head Wo Contrast  09/15/2012  *RADIOLOGY REPORT*  Clinical Data: Fall, head injury  CT MAXILLOFACIAL WITHOUT CONTRAST,CT HEAD WITHOUT CONTRAST,CT CERVICAL SPINE WITHOUT CONTRAST  Technique:  Multidetector CT imaging of the maxillofacial structures was performed. Multiplanar CT image reconstructions were also generated.,Technique:  Contiguous axial images were obtained from the base of the skull through the vertex without contrast.  Comparison: None.  CT head without IV contrast:  Findings: No skull fracture is noted.  Paranasal sinuses are unremarkable.  The mastoid air cells are unremarkable.  No intracranial hemorrhage, mass effect or midline shift.  No acute infarction.  No mass lesion is noted on this unenhanced scan.  IMPRESSION: No acute intracranial abnormality.  CT cervical spine without IV contrast:  Findings:  Axial images of the cervical spine shows no acute fracture or subluxation.  Sagittal images shows no acute fracture or subluxation.  Degenerative changes are noted C1-C2 articulation. Mild disc space flattening with mild posterior spurring at C3-C4 level.  There is minimal anterolisthesis about 1.5 mm C3 on C4 vertebral body.  Mild disc space flattening with mild posterior disc bulge at  C4-C5 level.  Mild disc space flattening at C5-C6 level.  Moderate disc space flattening with mild anterior and posterior spurring at C6-C7 level.  No prevertebral soft tissue swelling.  Cervical airway is patent.  Coronal images shows no acute fracture or subluxation.  Impression: 1.  No acute fracture or subluxation.  Degenerative changes as described above.  There is mild anterolisthesis about 1.5 mm C3 on C4 vertebral body.  CT maxillofacial without IV contrast  Findings:  Axial images shows no facial fractures.  No paranasal sinuses air fluid levels are noted.  Metallic dental artifacts are noted.  No nasal bone fracture.  No zygomatic fracture.  No orbital hematoma.  Bilateral eye globe is symmetrical in appearance.  Coronal reconstructed images shows no orbital rim or orbital floor fracture.  Mild left nasal septum deviation.  There is concha bullosa deformity of the right middle turbinate.  No mandibular fracture is noted.  No TMJ dislocation.  Sagittal images shows patent nasopharyngeal and oral pharyngeal airway.  No retropharyngeal soft tissue swelling.  Degenerative changes C1-C2 articulation are noted.  Impression: 1.  No facial fracture or facial fluid collection. 2.  No orbital rim or orbital floor fracture. 3.  No mandibular fracture.  No TMJ dislocation.   Original Report Authenticated By: Natasha Mead, M.D.    Ct Cervical Spine Wo Contrast  09/15/2012  *RADIOLOGY REPORT*  Clinical  Data: Fall, head injury  CT MAXILLOFACIAL WITHOUT CONTRAST,CT HEAD WITHOUT CONTRAST,CT CERVICAL SPINE WITHOUT CONTRAST  Technique:  Multidetector CT imaging of the maxillofacial structures was performed. Multiplanar CT image reconstructions were also generated.,Technique:  Contiguous axial images were obtained from the base of the skull through the vertex without contrast.  Comparison: None.  CT head without IV contrast:  Findings: No skull fracture is noted.  Paranasal sinuses are unremarkable.  The mastoid air cells are  unremarkable.  No intracranial hemorrhage, mass effect or midline shift.  No acute infarction.  No mass lesion is noted on this unenhanced scan.  IMPRESSION: No acute intracranial abnormality.  CT cervical spine without IV contrast:  Findings:  Axial images of the cervical spine shows no acute fracture or subluxation.  Sagittal images shows no acute fracture or subluxation.  Degenerative changes are noted C1-C2 articulation. Mild disc space flattening with mild posterior spurring at C3-C4 level.  There is minimal anterolisthesis about 1.5 mm C3 on C4 vertebral body.  Mild disc space flattening with mild posterior disc bulge at C4-C5 level.  Mild disc space flattening at C5-C6 level.  Moderate disc space flattening with mild anterior and posterior spurring at C6-C7 level.  No prevertebral soft tissue swelling.  Cervical airway is patent.  Coronal images shows no acute fracture or subluxation.  Impression: 1.  No acute fracture or subluxation.  Degenerative changes as described above.  There is mild anterolisthesis about 1.5 mm C3 on C4 vertebral body.  CT maxillofacial without IV contrast  Findings:  Axial images shows no facial fractures.  No paranasal sinuses air fluid levels are noted.  Metallic dental artifacts are noted.  No nasal bone fracture.  No zygomatic fracture.  No orbital hematoma.  Bilateral eye globe is symmetrical in appearance.  Coronal reconstructed images shows no orbital rim or orbital floor fracture.  Mild left nasal septum deviation.  There is concha bullosa deformity of the right middle turbinate.  No mandibular fracture is noted.  No TMJ dislocation.  Sagittal images shows patent nasopharyngeal and oral pharyngeal airway.  No retropharyngeal soft tissue swelling.  Degenerative changes C1-C2 articulation are noted.  Impression: 1.  No facial fracture or facial fluid collection. 2.  No orbital rim or orbital floor fracture. 3.  No mandibular fracture.  No TMJ dislocation.   Original Report  Authenticated By: Natasha Mead, M.D.    Ct Maxillofacial Wo Cm  09/15/2012  *RADIOLOGY REPORT*  Clinical Data: Fall, head injury  CT MAXILLOFACIAL WITHOUT CONTRAST,CT HEAD WITHOUT CONTRAST,CT CERVICAL SPINE WITHOUT CONTRAST  Technique:  Multidetector CT imaging of the maxillofacial structures was performed. Multiplanar CT image reconstructions were also generated.,Technique:  Contiguous axial images were obtained from the base of the skull through the vertex without contrast.  Comparison: None.  CT head without IV contrast:  Findings: No skull fracture is noted.  Paranasal sinuses are unremarkable.  The mastoid air cells are unremarkable.  No intracranial hemorrhage, mass effect or midline shift.  No acute infarction.  No mass lesion is noted on this unenhanced scan.  IMPRESSION: No acute intracranial abnormality.  CT cervical spine without IV contrast:  Findings:  Axial images of the cervical spine shows no acute fracture or subluxation.  Sagittal images shows no acute fracture or subluxation.  Degenerative changes are noted C1-C2 articulation. Mild disc space flattening with mild posterior spurring at C3-C4 level.  There is minimal anterolisthesis about 1.5 mm C3 on C4 vertebral body.  Mild disc space flattening with mild posterior  disc bulge at C4-C5 level.  Mild disc space flattening at C5-C6 level.  Moderate disc space flattening with mild anterior and posterior spurring at C6-C7 level.  No prevertebral soft tissue swelling.  Cervical airway is patent.  Coronal images shows no acute fracture or subluxation.  Impression: 1.  No acute fracture or subluxation.  Degenerative changes as described above.  There is mild anterolisthesis about 1.5 mm C3 on C4 vertebral body.  CT maxillofacial without IV contrast  Findings:  Axial images shows no facial fractures.  No paranasal sinuses air fluid levels are noted.  Metallic dental artifacts are noted.  No nasal bone fracture.  No zygomatic fracture.  No orbital hematoma.   Bilateral eye globe is symmetrical in appearance.  Coronal reconstructed images shows no orbital rim or orbital floor fracture.  Mild left nasal septum deviation.  There is concha bullosa deformity of the right middle turbinate.  No mandibular fracture is noted.  No TMJ dislocation.  Sagittal images shows patent nasopharyngeal and oral pharyngeal airway.  No retropharyngeal soft tissue swelling.  Degenerative changes C1-C2 articulation are noted.  Impression: 1.  No facial fracture or facial fluid collection. 2.  No orbital rim or orbital floor fracture. 3.  No mandibular fracture.  No TMJ dislocation.   Original Report Authenticated By: Natasha Mead, M.D.      1. Syncope   2. Hypertension       MDM   Date: 09/15/2012  Rate: 85  Rhythm: normal sinus rhythm  QRS Axis: normal  Intervals: normal  ST/T Wave abnormalities: normal  Conduction Disutrbances:none  Narrative Interpretation:   Old EKG Reviewed: none available    pts work-up and physical exam none acute. No causes for syncopal episode has been found.  I called [facility] hospitalists because I believe she needs to be monitored, at least overnight. Waiting for CK, orthostatics, urinalysis then pt will get fluids.  [facility] has agreed to admit.          Dorthula Matas, PA 09/15/12 1336  Dorthula Matas, PA 09/15/12 1359

## 2012-09-15 NOTE — ED Notes (Signed)
Pt is in CT

## 2012-09-16 ENCOUNTER — Ambulatory Visit: Payer: PRIVATE HEALTH INSURANCE | Admitting: Hematology and Oncology

## 2012-09-16 DIAGNOSIS — N390 Urinary tract infection, site not specified: Secondary | ICD-10-CM | POA: Diagnosis not present

## 2012-09-16 DIAGNOSIS — E876 Hypokalemia: Secondary | ICD-10-CM | POA: Diagnosis not present

## 2012-09-16 DIAGNOSIS — M549 Dorsalgia, unspecified: Secondary | ICD-10-CM | POA: Diagnosis not present

## 2012-09-16 DIAGNOSIS — I10 Essential (primary) hypertension: Secondary | ICD-10-CM | POA: Diagnosis not present

## 2012-09-16 DIAGNOSIS — R55 Syncope and collapse: Secondary | ICD-10-CM | POA: Diagnosis not present

## 2012-09-16 LAB — BASIC METABOLIC PANEL
BUN: 13 mg/dL (ref 6–23)
CO2: 22 mEq/L (ref 19–32)
Calcium: 9.5 mg/dL (ref 8.4–10.5)
Chloride: 107 mEq/L (ref 96–112)
Creatinine, Ser: 0.64 mg/dL (ref 0.50–1.10)
GFR calc Af Amer: >90 mL/min (ref >90)
GFR calc non Af Amer: >90 mL/min (ref >90)
Glucose, Bld: 90 mg/dL (ref 70–99)
Potassium: 4 mEq/L (ref 3.5–5.1)
Sodium: 138 mEq/L (ref 135–145)

## 2012-09-16 LAB — TROPONIN I: Troponin I: <0.3 ng/mL (ref <0.30)

## 2012-09-16 LAB — CBC
HCT: 39.9 % (ref 36.0–46.0)
Hemoglobin: 13.9 g/dL (ref 12.0–15.0)
MCH: 32.9 pg (ref 26.0–34.0)
MCHC: 34.8 g/dL (ref 30.0–36.0)
MCV: 94.3 fL (ref 78.0–100.0)
Platelets: 182 10*3/uL (ref 150–400)
RBC: 4.23 MIL/uL (ref 3.87–5.11)
RDW: 14.3 % (ref 11.5–15.5)
WBC: 6.9 10*3/uL (ref 4.0–10.5)

## 2012-09-16 LAB — TSH: TSH: 0.629 u[IU]/mL (ref 0.350–4.500)

## 2012-09-16 MED ORDER — PNEUMOCOCCAL VAC POLYVALENT 25 MCG/0.5ML IJ INJ
0.5000 mL | INJECTION | Freq: Once | INTRAMUSCULAR | Status: AC
  Administered 2012-09-16: 0.5 mL via INTRAMUSCULAR
  Filled 2012-09-16: qty 0.5

## 2012-09-16 MED ORDER — ACETAMINOPHEN 325 MG PO TABS: 650.0000 mg | ORAL_TABLET | Freq: Four times a day (QID) | ORAL | Status: DC | PRN

## 2012-09-16 MED ORDER — AMLODIPINE BESYLATE 5 MG PO TABS: 5.0000 mg | ORAL_TABLET | Freq: Every day | ORAL | Status: DC

## 2012-09-16 NOTE — Discharge Summary (Signed)
Physician Discharge Summary  NAME:Barbara Rangel  ZOX:[id number]  DOB: 10-08-1946   Admit date: 09/15/2012 Discharge date: 09/16/2012  Admitting Diagnosis: syncope  Discharge Diagnoses:  Principal Problem:  *Syncope - vasovagal after BP med, exercise, hot tub, and shower Active Problems:  Chronic back pain  Chronic urinary tract infection  Hypertension  Hypokalemia   Discharge Condition: improved  Hospital Course: Patient admitted overnight after syncope that occurred after exercise, use of hot tub, and while in hot shower. No arrythmia noted. Has remote history of similar events but none for years. Doubt serious etiology so will move antihypertensive to bedtime, reviewed what to do if she felt lightheaded (ie, lay on floor), and will observe.   Consults: none.   Disposition: 01-Home or Self Care  Discharge Orders    Future Appointments: Provider: Department: Dept Phone: Center:   12/29/2012 10:30 AM [facility] Dx Dexa 1 [facility] - [phone] [facility] [phone] [facility]   12/29/2012 11:00 AM [facility] Mm [facility] [phone] [facility]     Future Orders Please Complete By Expires   Diet - low sodium heart healthy      Increase activity slowly      Discharge instructions      Comments:   Do not exercise for one week or until headaches are gone, whichever is later       Medication List     As of 09/16/2012  7:11 AM    TAKE these medications         acetaminophen 325 MG tablet   Commonly known as: TYLENOL   Take 2 tablets (650 mg total) by mouth every 6 (six) hours as needed (or Fever >/= 101).      ADVIL PM PO   Take 2 tablets by mouth at bedtime.      alendronate 70 MG tablet   Commonly known as: FOSAMAX   Take 1 tablet (70 mg total) by mouth every 7 (seven) days. Hold while in hospital. Thursday.  Take with a full glass of water on an empty stomach.      amLODipine 5 MG tablet   Commonly known as: NORVASC   Take 1 tablet  (5 mg total) by mouth daily. Take in evening or at bedtime      anastrozole 1 MG tablet   Commonly known as: ARIMIDEX   Take 1 tablet (1 mg total) by mouth daily.      calcium citrate-vitamin D 315-200 MG-UNIT per tablet   Commonly known as: CITRACAL+D   Take 1 tablet by mouth 2 (two) times daily.      DIPHENHYDRAMINE HCL PO   Take 1 capsule by mouth at bedtime.      docusate sodium 100 MG capsule   Commonly known as: COLACE   Take 100 mg by mouth 2 (two) times daily.      latanoprost 0.005 % ophthalmic solution   Commonly known as: XALATAN   Place 1 drop into both eyes at bedtime.      loratadine 10 MG dissolvable tablet   Commonly known as: CLARITIN REDITABS   Take 10 mg by mouth as needed. For allergies      multivitamins ther. w/minerals Tabs   Take 1 tablet by mouth daily.      nitrofurantoin 100 MG capsule   Commonly known as: MACRODANTIN   Take 100 mg by mouth daily.      omeprazole 40 MG capsule   Commonly known as: PRILOSEC  Take 40 mg by mouth daily.      OVER THE COUNTER MEDICATION   Take 1 capsule by mouth daily. ellura- cranberry extract           Follow-up Information    Follow up with Darnelle Bos, MD. On 12/10/2012. (at 11:15 AM as scheduled)    Contact information:   [address], [address] [facility], [place] [address] [phone]          Things to follow up in the outpatient setting: none  Time coordinating discharge: 20 minutes including medication reconciliation,  preparation of discharge papers, and discussion with patient     Signed: Darnelle Bos 09/16/2012, 7:11 AM

## 2012-09-16 NOTE — Progress Notes (Signed)
UR Completed.  [name] [phone] 09/16/2012  

## 2012-09-18 ENCOUNTER — Other Ambulatory Visit: Payer: Self-pay | Admitting: *Deleted

## 2012-09-18 ENCOUNTER — Telehealth: Payer: Self-pay | Admitting: *Deleted

## 2012-09-18 DIAGNOSIS — N390 Urinary tract infection, site not specified: Secondary | ICD-10-CM | POA: Diagnosis not present

## 2012-09-18 DIAGNOSIS — Z124 Encounter for screening for malignant neoplasm of cervix: Secondary | ICD-10-CM | POA: Diagnosis not present

## 2012-09-18 NOTE — Telephone Encounter (Signed)
Received message from pt wanting to reschedule f/u appt with Dr. Dalene Carrow.    Pt was in the hospital and did not come to her scheduled appts.  Message to md. Pt's  Phone     [phone].

## 2012-09-21 ENCOUNTER — Telehealth: Payer: Self-pay | Admitting: *Deleted

## 2012-09-21 NOTE — Telephone Encounter (Signed)
Patient confirmed over the phone the new date and time on 10-09-2012 at 12:15

## 2012-10-05 DIAGNOSIS — H4011X0 Primary open-angle glaucoma, stage unspecified: Secondary | ICD-10-CM | POA: Diagnosis not present

## 2012-10-09 ENCOUNTER — Ambulatory Visit (HOSPITAL_BASED_OUTPATIENT_CLINIC_OR_DEPARTMENT_OTHER): Payer: Medicare Other | Admitting: Hematology and Oncology

## 2012-10-09 ENCOUNTER — Encounter: Payer: Self-pay | Admitting: Hematology and Oncology

## 2012-10-09 ENCOUNTER — Telehealth: Payer: Self-pay | Admitting: Hematology and Oncology

## 2012-10-09 ENCOUNTER — Ambulatory Visit (HOSPITAL_BASED_OUTPATIENT_CLINIC_OR_DEPARTMENT_OTHER): Payer: Medicare Other | Admitting: Lab

## 2012-10-09 VITALS — BP 118/68 | HR 86 | Temp 97.0°F | Resp 18 | Ht 65.0 in | Wt 133.7 lb

## 2012-10-09 DIAGNOSIS — C50919 Malignant neoplasm of unspecified site of unspecified female breast: Secondary | ICD-10-CM

## 2012-10-09 DIAGNOSIS — Z170 Estrogen receptor positive status [ER+]: Secondary | ICD-10-CM | POA: Diagnosis not present

## 2012-10-09 DIAGNOSIS — M810 Age-related osteoporosis without current pathological fracture: Secondary | ICD-10-CM | POA: Diagnosis not present

## 2012-10-09 NOTE — Progress Notes (Signed)
This office note has been dictated.

## 2012-10-09 NOTE — Telephone Encounter (Signed)
gv and printed appt schedule for pt for Feb and Aug 2014

## 2012-10-09 NOTE — Progress Notes (Signed)
CC:   Theressa Millard, M.D.  IDENTIFYING STATEMENT:  The patient is a 66 year old woman with breast cancer who presents for followup.  INTERVAL HISTORY:  Barbara Rangel seen early this year.  Within the last month she had a syncopal episode.  Workup was unremarkable.  It was felt this was probably secondary to dehydration.  She presently feels well. Has no current concerns.  Has not noted any new left breast masses. Denies any chest wall lesions.  Denies back pain.  Her weight is stable. She continues Arimidex with very minimal complaints.  Has no hot flashes.  MEDICATIONS: 1. Arimidex 1 mg daily. 2. Fosamax 70 mg weekly. 3. Rest of medicines reviewed and updated.  ALLERGIES:  Trimethoprim, Cipro, Avelox, Celexa, Keflex.  PAST MEDICAL HISTORY/FAMILY HISTORY/SOCIAL HISTORY:  Unchanged.  REVIEW OF SYSTEMS:  A ten point review of systems negative.  PHYSICAL EXAM:  General:  The patient is a well-appearing, well- nourished woman in no distress.  Vitals:  Pulse 86, blood pressure 118/68, temperature 97, respirations 18, weight 132.7 pounds.  HEENT: Head is atraumatic, normocephalic.  Sclerae anicteric.  Mouth moist. Chest:  Clear.  CVS:  Unremarkable.  Abdomen:  Soft, nontender.  No masses.  Bowel sounds present.  Extremities:  No edema.  Breasts:  Exam notes a well-healed right incision with no surrounding chest wall masses or lesions.  Left breast shows no dominant masses or nipple discharge. Lymph nodes:  No adenopathy.  CNS:  Nonfocal.  LABORATORY DATA:  On 09/16/2012 white cell count 6.9, hemoglobin 13.9, hematocrit 39.9, platelets 182.  Sodium 138, potassium 4, chloride 107, CO2 22, BUN 13, creatinine 0.64, glucose 90.  CA27.29 is 23.  IMPRESSION AND PLAN:  [name] is a 66 year old woman status post mastectomy for a T2N1 M0 estrogen/progesterone receptor positive and HER-2/neu negative invasive right breast cancer diagnosed in June of 2005.  She is status post NSABP B28 trial  and began dose dense AC followed by Taxol from 11/19/2003 through 09/21/2004 in [place]. She began Arimidex in adjuvant setting in January 2006.  She will continue with Arimidex for a total of 10 years as she is tolerating therapy well with very minimal complaints.  Naturally she will require bone density/DEXA scan performed annually.  Will remain on Fosamax and Os-Cal.  She will also supplement her diet with vitamin D.  We will have her return to lab to obtain vitamin D level, and telephone her with those results.  Please note she remains on Arimidex based on the Atlas study, which showed that tamoxifen for 10 years versus 5 years had specifically decreased mortality.  She follows up in 9 months' time.   ______________________________ Laurice Record, M.D. LIO/MEDQ  D:  10/09/2012  T:  10/09/2012  Job:  [id number]

## 2012-10-09 NOTE — Patient Instructions (Addendum)
Barbara Rangel  [id number]   [facility] - AFTER VISIT SUMMARY   **RECOMMENDATIONS MADE BY THE CONSULTANT AND ANY TEST    RESULTS WILL BE SENT TO YOUR REFERRING DOCTORS.   YOUR EXAM FINDINGS, LABS AND RESULTS WERE DISCUSSED BY YOUR MD TODAY.  YOU CAN GO TO THE [facility] WEB SITE FOR INSTRUCTIONS ON HOW TO ASSESS MY CHART FOR ADDITIONAL INFORMATION AS NEEDED.  Your Updated drug allergies are: Allergies as of 10/09/2012 - Review Complete 09/15/2012  Allergen Reaction Noted  . Avelox (moxifloxacin hcl in nacl) Other (See Comments) 05/09/2011  . Tramadol Nausea Only and Other (See Comments) 05/09/2011  . Trimethoprim Other (See Comments) 05/09/2011  . Citalopram hydrobromide  05/09/2011  . Cephalexin Other (See Comments) 05/09/2011    Your current list of medications are: Current Outpatient Prescriptions  Medication Sig Dispense Refill  . acetaminophen (TYLENOL) 325 MG tablet Take 2 tablets (650 mg total) by mouth every 6 (six) hours as needed (or Fever >/= 101).      Marland Kitchen alendronate (FOSAMAX) 70 MG tablet Take 1 tablet (70 mg total) by mouth every 7 (seven) days. Hold while in hospital. Thursday.  Take with a full glass of water on an empty stomach.  4 tablet  2  . amLODipine (NORVASC) 5 MG tablet Take 1 tablet (5 mg total) by mouth daily. Take in evening or at bedtime      . anastrozole (ARIMIDEX) 1 MG tablet Take 1 tablet (1 mg total) by mouth daily.  90 tablet  3  . calcium citrate-vitamin D (CITRACAL+D) 315-200 MG-UNIT per tablet Take 1 tablet by mouth 2 (two) times daily.       Marland Kitchen DIPHENHYDRAMINE HCL PO Take 1 capsule by mouth at bedtime.      . docusate sodium (COLACE) 100 MG capsule Take 100 mg by mouth 2 (two) times daily.        . Ibuprofen-Diphenhydramine Cit (ADVIL PM PO) Take 2 tablets by mouth at bedtime.      Marland Kitchen latanoprost (XALATAN) 0.005 % ophthalmic solution Place 1 drop into both eyes at bedtime.        Marland Kitchen loratadine (CLARITIN REDITABS) 10 MG dissolvable  tablet Take 10 mg by mouth as needed. For allergies      . Multiple Vitamins-Minerals (MULTIVITAMINS THER. W/MINERALS) TABS Take 1 tablet by mouth daily.        . nitrofurantoin (MACRODANTIN) 100 MG capsule Take 100 mg by mouth daily.        Marland Kitchen omeprazole (PRILOSEC) 40 MG capsule Take 40 mg by mouth daily.      Marland Kitchen OVER THE COUNTER MEDICATION Take 1 capsule by mouth daily. ellura- cranberry extract         INSTRUCTIONS GIVEN AND DISCUSSED:  See attached schedule   SPECIAL INSTRUCTIONS/FOLLOW-UP:  See above.  I acknowledge that I have been informed and understand all the instructions given to me and received a copy.I know to contact the clinic, my physician, or go to the emergency Department if any problems should occur.   I do not have any more questions at this time, but understand that I may call the [facility] at [phone] during business hours should I have any further questions or need assistance in obtaining follow-up care.

## 2012-10-10 LAB — VITAMIN D 25 HYDROXY (VIT D DEFICIENCY, FRACTURES): Vit D, 25-Hydroxy: 52 ng/mL (ref 30–89)

## 2012-10-13 ENCOUNTER — Telehealth: Payer: Self-pay | Admitting: *Deleted

## 2012-10-13 NOTE — Telephone Encounter (Signed)
Spoke with pt and informed pt re:  Vit D levels adequate.  Pt to continue with OSCAL-D as per md's instructions.   Pt voiced understanding.

## 2012-12-10 DIAGNOSIS — G609 Hereditary and idiopathic neuropathy, unspecified: Secondary | ICD-10-CM | POA: Diagnosis not present

## 2012-12-10 DIAGNOSIS — M6281 Muscle weakness (generalized): Secondary | ICD-10-CM | POA: Diagnosis not present

## 2012-12-10 DIAGNOSIS — R252 Cramp and spasm: Secondary | ICD-10-CM | POA: Diagnosis not present

## 2012-12-16 ENCOUNTER — Other Ambulatory Visit: Payer: Self-pay | Admitting: Hematology and Oncology

## 2012-12-16 DIAGNOSIS — C50919 Malignant neoplasm of unspecified site of unspecified female breast: Secondary | ICD-10-CM

## 2012-12-21 DIAGNOSIS — Z853 Personal history of malignant neoplasm of breast: Secondary | ICD-10-CM | POA: Diagnosis not present

## 2012-12-21 DIAGNOSIS — G609 Hereditary and idiopathic neuropathy, unspecified: Secondary | ICD-10-CM | POA: Diagnosis not present

## 2012-12-21 DIAGNOSIS — G8190 Hemiplegia, unspecified affecting unspecified side: Secondary | ICD-10-CM | POA: Diagnosis not present

## 2012-12-21 DIAGNOSIS — R252 Cramp and spasm: Secondary | ICD-10-CM | POA: Diagnosis not present

## 2012-12-28 ENCOUNTER — Other Ambulatory Visit: Payer: Self-pay | Admitting: Neurology

## 2012-12-28 DIAGNOSIS — R252 Cramp and spasm: Secondary | ICD-10-CM

## 2012-12-28 DIAGNOSIS — G8190 Hemiplegia, unspecified affecting unspecified side: Secondary | ICD-10-CM

## 2012-12-28 DIAGNOSIS — G609 Hereditary and idiopathic neuropathy, unspecified: Secondary | ICD-10-CM

## 2012-12-28 DIAGNOSIS — Z853 Personal history of malignant neoplasm of breast: Secondary | ICD-10-CM

## 2012-12-29 ENCOUNTER — Ambulatory Visit
Admission: RE | Admit: 2012-12-29 | Discharge: 2012-12-29 | Disposition: A | Discharge status: Home / Self Care | Payer: Medicare Other | Source: Ambulatory Visit | Attending: Hematology and Oncology | Admitting: Hematology and Oncology

## 2012-12-29 DIAGNOSIS — C50919 Malignant neoplasm of unspecified site of unspecified female breast: Secondary | ICD-10-CM

## 2012-12-29 DIAGNOSIS — Z780 Asymptomatic menopausal state: Secondary | ICD-10-CM | POA: Diagnosis not present

## 2012-12-29 DIAGNOSIS — Z1231 Encounter for screening mammogram for malignant neoplasm of breast: Secondary | ICD-10-CM | POA: Diagnosis not present

## 2012-12-29 DIAGNOSIS — Z853 Personal history of malignant neoplasm of breast: Secondary | ICD-10-CM | POA: Diagnosis not present

## 2013-01-02 ENCOUNTER — Telehealth: Payer: Self-pay | Admitting: Oncology

## 2013-01-02 ENCOUNTER — Encounter: Payer: Self-pay | Admitting: Oncology

## 2013-01-02 NOTE — Telephone Encounter (Signed)
S/w the pt and she is aware of the reassigning from dr lo to dr ha along with a f/u letter and calendar

## 2013-01-05 ENCOUNTER — Ambulatory Visit
Admission: RE | Admit: 2013-01-05 | Discharge: 2013-01-05 | Disposition: A | Discharge status: Home / Self Care | Payer: Medicare Other | Source: Ambulatory Visit | Attending: Neurology | Admitting: Neurology

## 2013-01-05 DIAGNOSIS — R252 Cramp and spasm: Secondary | ICD-10-CM

## 2013-01-05 DIAGNOSIS — Z853 Personal history of malignant neoplasm of breast: Secondary | ICD-10-CM

## 2013-01-05 DIAGNOSIS — G8190 Hemiplegia, unspecified affecting unspecified side: Secondary | ICD-10-CM

## 2013-01-05 DIAGNOSIS — G609 Hereditary and idiopathic neuropathy, unspecified: Secondary | ICD-10-CM

## 2013-01-05 DIAGNOSIS — H4011X0 Primary open-angle glaucoma, stage unspecified: Secondary | ICD-10-CM | POA: Diagnosis not present

## 2013-01-05 MED ORDER — GADOBENATE DIMEGLUMINE 529 MG/ML IV SOLN
12.0000 mL | Freq: Once | INTRAVENOUS | Status: AC | PRN
  Administered 2013-01-05: 12 mL via INTRAVENOUS

## 2013-01-08 DIAGNOSIS — R55 Syncope and collapse: Secondary | ICD-10-CM | POA: Diagnosis not present

## 2013-01-11 ENCOUNTER — Other Ambulatory Visit: Payer: Self-pay | Admitting: Internal Medicine

## 2013-01-11 ENCOUNTER — Ambulatory Visit
Admission: RE | Admit: 2013-01-11 | Discharge: 2013-01-11 | Disposition: A | Discharge status: Home / Self Care | Payer: Medicare Other | Source: Ambulatory Visit | Attending: Internal Medicine | Admitting: Internal Medicine

## 2013-01-11 DIAGNOSIS — R109 Unspecified abdominal pain: Secondary | ICD-10-CM | POA: Diagnosis not present

## 2013-01-11 DIAGNOSIS — R1031 Right lower quadrant pain: Secondary | ICD-10-CM

## 2013-01-11 MED ORDER — IOHEXOL 300 MG/ML  SOLN
100.0000 mL | Freq: Once | INTRAMUSCULAR | Status: AC | PRN
  Administered 2013-01-11: 100 mL via INTRAVENOUS

## 2013-01-11 MED ORDER — IOHEXOL 300 MG/ML  SOLN
30.0000 mL | Freq: Once | INTRAMUSCULAR | Status: AC | PRN
  Administered 2013-01-11: 30 mL via INTRAVENOUS

## 2013-01-12 DIAGNOSIS — R55 Syncope and collapse: Secondary | ICD-10-CM | POA: Diagnosis not present

## 2013-01-13 DIAGNOSIS — R55 Syncope and collapse: Secondary | ICD-10-CM | POA: Diagnosis not present

## 2013-01-18 DIAGNOSIS — R0989 Other specified symptoms and signs involving the circulatory and respiratory systems: Secondary | ICD-10-CM | POA: Diagnosis not present

## 2013-01-18 DIAGNOSIS — N390 Urinary tract infection, site not specified: Secondary | ICD-10-CM | POA: Diagnosis not present

## 2013-01-18 DIAGNOSIS — M949 Disorder of cartilage, unspecified: Secondary | ICD-10-CM | POA: Diagnosis not present

## 2013-01-18 DIAGNOSIS — M899 Disorder of bone, unspecified: Secondary | ICD-10-CM | POA: Diagnosis not present

## 2013-01-18 DIAGNOSIS — Z0000 Encounter for general adult medical examination without abnormal findings: Secondary | ICD-10-CM | POA: Diagnosis not present

## 2013-01-18 DIAGNOSIS — I10 Essential (primary) hypertension: Secondary | ICD-10-CM | POA: Diagnosis not present

## 2013-01-20 DIAGNOSIS — R55 Syncope and collapse: Secondary | ICD-10-CM | POA: Diagnosis not present

## 2013-01-20 DIAGNOSIS — M479 Spondylosis, unspecified: Secondary | ICD-10-CM | POA: Diagnosis not present

## 2013-01-20 DIAGNOSIS — G609 Hereditary and idiopathic neuropathy, unspecified: Secondary | ICD-10-CM | POA: Diagnosis not present

## 2013-01-20 DIAGNOSIS — R252 Cramp and spasm: Secondary | ICD-10-CM | POA: Diagnosis not present

## 2013-01-21 DIAGNOSIS — N390 Urinary tract infection, site not specified: Secondary | ICD-10-CM | POA: Diagnosis not present

## 2013-02-02 DIAGNOSIS — M47812 Spondylosis without myelopathy or radiculopathy, cervical region: Secondary | ICD-10-CM | POA: Diagnosis not present

## 2013-02-03 DIAGNOSIS — R0989 Other specified symptoms and signs involving the circulatory and respiratory systems: Secondary | ICD-10-CM | POA: Diagnosis not present

## 2013-02-05 DIAGNOSIS — M545 Low back pain, unspecified: Secondary | ICD-10-CM | POA: Diagnosis not present

## 2013-02-08 DIAGNOSIS — N390 Urinary tract infection, site not specified: Secondary | ICD-10-CM | POA: Diagnosis not present

## 2013-02-11 ENCOUNTER — Other Ambulatory Visit: Payer: Self-pay | Admitting: Oncology

## 2013-02-17 ENCOUNTER — Ambulatory Visit: Payer: Medicare Other | Attending: Neurosurgery | Admitting: Physical Therapy

## 2013-02-17 DIAGNOSIS — IMO0001 Reserved for inherently not codable concepts without codable children: Secondary | ICD-10-CM | POA: Insufficient documentation

## 2013-02-17 DIAGNOSIS — M542 Cervicalgia: Secondary | ICD-10-CM | POA: Insufficient documentation

## 2013-02-17 DIAGNOSIS — R293 Abnormal posture: Secondary | ICD-10-CM | POA: Diagnosis not present

## 2013-02-22 ENCOUNTER — Ambulatory Visit: Payer: Medicare Other | Admitting: Physical Therapy

## 2013-02-24 ENCOUNTER — Ambulatory Visit: Payer: Medicare Other | Admitting: Physical Therapy

## 2013-02-24 DIAGNOSIS — R293 Abnormal posture: Secondary | ICD-10-CM | POA: Diagnosis not present

## 2013-02-24 DIAGNOSIS — IMO0001 Reserved for inherently not codable concepts without codable children: Secondary | ICD-10-CM | POA: Diagnosis not present

## 2013-02-24 DIAGNOSIS — M542 Cervicalgia: Secondary | ICD-10-CM | POA: Diagnosis not present

## 2013-03-01 ENCOUNTER — Ambulatory Visit: Payer: Medicare Other | Admitting: Physical Therapy

## 2013-03-01 DIAGNOSIS — M542 Cervicalgia: Secondary | ICD-10-CM | POA: Diagnosis not present

## 2013-03-01 DIAGNOSIS — IMO0001 Reserved for inherently not codable concepts without codable children: Secondary | ICD-10-CM | POA: Diagnosis not present

## 2013-03-01 DIAGNOSIS — R293 Abnormal posture: Secondary | ICD-10-CM | POA: Diagnosis not present

## 2013-03-03 ENCOUNTER — Ambulatory Visit: Payer: Medicare Other | Admitting: Physical Therapy

## 2013-03-03 DIAGNOSIS — M25579 Pain in unspecified ankle and joints of unspecified foot: Secondary | ICD-10-CM | POA: Diagnosis not present

## 2013-03-03 DIAGNOSIS — R413 Other amnesia: Secondary | ICD-10-CM | POA: Diagnosis not present

## 2013-03-03 DIAGNOSIS — K219 Gastro-esophageal reflux disease without esophagitis: Secondary | ICD-10-CM | POA: Diagnosis not present

## 2013-03-03 DIAGNOSIS — I779 Disorder of arteries and arterioles, unspecified: Secondary | ICD-10-CM | POA: Diagnosis not present

## 2013-03-08 ENCOUNTER — Ambulatory Visit: Payer: Medicare Other | Admitting: Physical Therapy

## 2013-03-08 DIAGNOSIS — R293 Abnormal posture: Secondary | ICD-10-CM | POA: Diagnosis not present

## 2013-03-08 DIAGNOSIS — IMO0001 Reserved for inherently not codable concepts without codable children: Secondary | ICD-10-CM | POA: Diagnosis not present

## 2013-03-08 DIAGNOSIS — M542 Cervicalgia: Secondary | ICD-10-CM | POA: Diagnosis not present

## 2013-03-10 ENCOUNTER — Encounter: Payer: Medicare Other | Admitting: Physical Therapy

## 2013-03-10 ENCOUNTER — Ambulatory Visit: Payer: Medicare Other | Admitting: Physical Therapy

## 2013-03-10 DIAGNOSIS — M47812 Spondylosis without myelopathy or radiculopathy, cervical region: Secondary | ICD-10-CM | POA: Diagnosis not present

## 2013-03-15 ENCOUNTER — Ambulatory Visit: Payer: Medicare Other | Attending: Neurosurgery | Admitting: Physical Therapy

## 2013-03-15 DIAGNOSIS — R293 Abnormal posture: Secondary | ICD-10-CM | POA: Diagnosis not present

## 2013-03-15 DIAGNOSIS — M542 Cervicalgia: Secondary | ICD-10-CM | POA: Insufficient documentation

## 2013-03-15 DIAGNOSIS — IMO0001 Reserved for inherently not codable concepts without codable children: Secondary | ICD-10-CM | POA: Diagnosis not present

## 2013-03-17 ENCOUNTER — Ambulatory Visit: Payer: Medicare Other | Admitting: Physical Therapy

## 2013-03-17 DIAGNOSIS — R293 Abnormal posture: Secondary | ICD-10-CM | POA: Diagnosis not present

## 2013-03-17 DIAGNOSIS — M542 Cervicalgia: Secondary | ICD-10-CM | POA: Diagnosis not present

## 2013-03-17 DIAGNOSIS — IMO0001 Reserved for inherently not codable concepts without codable children: Secondary | ICD-10-CM | POA: Diagnosis not present

## 2013-03-22 ENCOUNTER — Ambulatory Visit: Payer: Medicare Other | Admitting: Physical Therapy

## 2013-03-24 ENCOUNTER — Ambulatory Visit: Payer: Medicare Other | Admitting: Rehabilitation

## 2013-03-24 DIAGNOSIS — IMO0001 Reserved for inherently not codable concepts without codable children: Secondary | ICD-10-CM | POA: Diagnosis not present

## 2013-03-24 DIAGNOSIS — R293 Abnormal posture: Secondary | ICD-10-CM | POA: Diagnosis not present

## 2013-03-24 DIAGNOSIS — M542 Cervicalgia: Secondary | ICD-10-CM | POA: Diagnosis not present

## 2013-03-31 ENCOUNTER — Ambulatory Visit: Payer: Medicare Other | Admitting: Physical Therapy

## 2013-03-31 DIAGNOSIS — R293 Abnormal posture: Secondary | ICD-10-CM | POA: Diagnosis not present

## 2013-03-31 DIAGNOSIS — IMO0001 Reserved for inherently not codable concepts without codable children: Secondary | ICD-10-CM | POA: Diagnosis not present

## 2013-03-31 DIAGNOSIS — M542 Cervicalgia: Secondary | ICD-10-CM | POA: Diagnosis not present

## 2013-04-02 ENCOUNTER — Ambulatory Visit: Payer: Medicare Other | Admitting: Rehabilitation

## 2013-04-02 DIAGNOSIS — R293 Abnormal posture: Secondary | ICD-10-CM | POA: Diagnosis not present

## 2013-04-02 DIAGNOSIS — IMO0001 Reserved for inherently not codable concepts without codable children: Secondary | ICD-10-CM | POA: Diagnosis not present

## 2013-04-02 DIAGNOSIS — M542 Cervicalgia: Secondary | ICD-10-CM | POA: Diagnosis not present

## 2013-04-06 ENCOUNTER — Ambulatory Visit: Payer: Medicare Other | Admitting: Physical Therapy

## 2013-04-06 ENCOUNTER — Telehealth: Payer: Self-pay | Admitting: Neurology

## 2013-04-06 DIAGNOSIS — H4011X0 Primary open-angle glaucoma, stage unspecified: Secondary | ICD-10-CM | POA: Diagnosis not present

## 2013-04-06 DIAGNOSIS — M542 Cervicalgia: Secondary | ICD-10-CM | POA: Diagnosis not present

## 2013-04-06 DIAGNOSIS — IMO0001 Reserved for inherently not codable concepts without codable children: Secondary | ICD-10-CM | POA: Diagnosis not present

## 2013-04-06 DIAGNOSIS — R293 Abnormal posture: Secondary | ICD-10-CM | POA: Diagnosis not present

## 2013-04-08 ENCOUNTER — Ambulatory Visit: Payer: Medicare Other | Admitting: Physical Therapy

## 2013-04-08 DIAGNOSIS — M542 Cervicalgia: Secondary | ICD-10-CM | POA: Diagnosis not present

## 2013-04-08 DIAGNOSIS — R293 Abnormal posture: Secondary | ICD-10-CM | POA: Diagnosis not present

## 2013-04-08 DIAGNOSIS — IMO0001 Reserved for inherently not codable concepts without codable children: Secondary | ICD-10-CM | POA: Diagnosis not present

## 2013-04-12 ENCOUNTER — Ambulatory Visit: Payer: Medicare Other | Attending: Neurosurgery | Admitting: Rehabilitation

## 2013-04-12 DIAGNOSIS — R293 Abnormal posture: Secondary | ICD-10-CM | POA: Insufficient documentation

## 2013-04-12 DIAGNOSIS — M542 Cervicalgia: Secondary | ICD-10-CM | POA: Insufficient documentation

## 2013-04-12 DIAGNOSIS — IMO0001 Reserved for inherently not codable concepts without codable children: Secondary | ICD-10-CM | POA: Insufficient documentation

## 2013-04-14 ENCOUNTER — Ambulatory Visit: Payer: Medicare Other | Admitting: Physical Therapy

## 2013-04-14 DIAGNOSIS — R293 Abnormal posture: Secondary | ICD-10-CM | POA: Diagnosis not present

## 2013-04-14 DIAGNOSIS — IMO0001 Reserved for inherently not codable concepts without codable children: Secondary | ICD-10-CM | POA: Diagnosis not present

## 2013-04-14 DIAGNOSIS — M542 Cervicalgia: Secondary | ICD-10-CM | POA: Diagnosis not present

## 2013-04-20 ENCOUNTER — Ambulatory Visit: Payer: Medicare Other | Admitting: Rehabilitation

## 2013-04-20 DIAGNOSIS — M542 Cervicalgia: Secondary | ICD-10-CM | POA: Diagnosis not present

## 2013-04-20 DIAGNOSIS — R293 Abnormal posture: Secondary | ICD-10-CM | POA: Diagnosis not present

## 2013-04-20 DIAGNOSIS — IMO0001 Reserved for inherently not codable concepts without codable children: Secondary | ICD-10-CM | POA: Diagnosis not present

## 2013-04-23 ENCOUNTER — Ambulatory Visit: Payer: Medicare Other | Admitting: Rehabilitation

## 2013-04-23 DIAGNOSIS — R293 Abnormal posture: Secondary | ICD-10-CM | POA: Diagnosis not present

## 2013-04-23 DIAGNOSIS — IMO0001 Reserved for inherently not codable concepts without codable children: Secondary | ICD-10-CM | POA: Diagnosis not present

## 2013-04-23 DIAGNOSIS — M542 Cervicalgia: Secondary | ICD-10-CM | POA: Diagnosis not present

## 2013-04-26 ENCOUNTER — Ambulatory Visit: Payer: Medicare Other | Admitting: Physical Therapy

## 2013-04-26 ENCOUNTER — Ambulatory Visit: Payer: Self-pay | Admitting: Neurology

## 2013-04-26 DIAGNOSIS — R293 Abnormal posture: Secondary | ICD-10-CM | POA: Diagnosis not present

## 2013-04-26 DIAGNOSIS — M542 Cervicalgia: Secondary | ICD-10-CM | POA: Diagnosis not present

## 2013-04-26 DIAGNOSIS — IMO0001 Reserved for inherently not codable concepts without codable children: Secondary | ICD-10-CM | POA: Diagnosis not present

## 2013-04-28 ENCOUNTER — Ambulatory Visit: Payer: Medicare Other | Admitting: Physical Therapy

## 2013-04-28 DIAGNOSIS — M542 Cervicalgia: Secondary | ICD-10-CM | POA: Diagnosis not present

## 2013-04-28 DIAGNOSIS — R293 Abnormal posture: Secondary | ICD-10-CM | POA: Diagnosis not present

## 2013-04-28 DIAGNOSIS — IMO0001 Reserved for inherently not codable concepts without codable children: Secondary | ICD-10-CM | POA: Diagnosis not present

## 2013-05-12 DIAGNOSIS — M79609 Pain in unspecified limb: Secondary | ICD-10-CM | POA: Diagnosis not present

## 2013-05-12 DIAGNOSIS — M545 Low back pain, unspecified: Secondary | ICD-10-CM | POA: Diagnosis not present

## 2013-05-13 ENCOUNTER — Ambulatory Visit (INDEPENDENT_AMBULATORY_CARE_PROVIDER_SITE_OTHER): Payer: Medicare Other | Admitting: Neurology

## 2013-05-13 ENCOUNTER — Encounter: Payer: Self-pay | Admitting: Neurology

## 2013-05-13 VITALS — BP 130/69 | HR 72 | Temp 97.0°F | Ht 65.0 in | Wt 134.0 lb

## 2013-05-13 DIAGNOSIS — Z853 Personal history of malignant neoplasm of breast: Secondary | ICD-10-CM

## 2013-05-13 DIAGNOSIS — R413 Other amnesia: Secondary | ICD-10-CM

## 2013-05-13 DIAGNOSIS — M47812 Spondylosis without myelopathy or radiculopathy, cervical region: Secondary | ICD-10-CM

## 2013-05-13 DIAGNOSIS — G609 Hereditary and idiopathic neuropathy, unspecified: Secondary | ICD-10-CM

## 2013-05-13 NOTE — Patient Instructions (Addendum)
I think overall you are doing fairly well and are stable at this point.   I do have some generic suggestions for you today:  Please make sure that you drink plenty of fluids. I would like for you to exercise daily for example in the form of walking 20-30 minutes every day, if you can. Please keep a regular sleep-wake schedule, keep regular meal times, do not skip any meals, eat  healthy snacks in between meals, such as fruit or nuts. Try to eat protein with every meal.   As far as your medications are concerned, I would like to suggest: no new meds.   As far as diagnostic testing, I recommend: referral for full cognitive testing.  Engage in social activities in your community and with your family and try to keep up with current events by reading the newspaper or watching the news.  I do not think we need to make any changes in your medications at this point. I think you're stable enough that I can see you back in 6 months, sooner if we need to. Please call us if you have any interim questions, concerns, or problems or updates to need to discuss.  Please also call us for any test results so we can go over those with you on the phone. Brett Canales is my clinical assistant and will answer any of your questions and relay your messages to me and will give you my messages.   Our phone number is [phone]. We also have an after hours call service for urgent matters and there is a physician on-call for urgent questions. For any emergencies you know to call 911 or go to the nearest emergency room.

## 2013-05-13 NOTE — Progress Notes (Signed)
Subjective:    Patient ID: Barbara Rangel is a 67 y.o. female.  HPI  Interim history:   Barbara Rangel is a very pleasant 67 year old right-handed woman who presents for followup consultation of her right upper extremity weakness and peripheral neuropathy. I first met her on 12/21/2012 at which time I suggested a brain and C-spine MRI. She was then seen back on 01/20/2013, at which time we went over her test results including her C-spine MRI which showed disc bulging with facet hypertrophy and mild spinal stenosis and severe biforaminal stenosis at C6-7, disc bulging with slight contact upon anterior spinal cord, facet hypertrophy with severe biforaminal stenosis at C5-6, disc bulging with facet hypertrophy and biforaminal stenosis at C4-5, and a brain MRI with and without contrast on 01/05/2013 showed few punctate foci of nonspecific lesions in the right greater than left subcortical white matter, no enhancing lesions. She has an underlying history of breast cancer diagnosed in 2005 and treated with lumpectomy, then mastectomy and high-dose chemotherapy as part of clinical trial. 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 She has a history of head injury in November 2013Felt that she had ongoing evidence of neuropathy. I suggested a neurosurgical consultation for her neck degenerative disc disease for symptomatic treatment of her neck muscle spasms I suggested a trial of baclofen. She saw Dr. Dutch Quint for her degenerative neck d/s and went through PT for 12 weeks, which helped a lot. She was having a lot of leg cramps and these are better with drinking coconut water, Mg 250 mg daily, and mustard. She had C. Doppler study recently and was told she had mild plaques. She has since then been started on ASA 81 mg and generic Lipitor at 20 mg.  She reports more problems with her short term memory, calling it "chemo brain" and recently went to a seminar in [facility] on this topic. She is  overall better, but feels worse with regards to her memory.  Her Past Medical History Is Significant For: Past Medical History  Diagnosis Date  . PONV (postoperative nausea and vomiting)   . Other and unspecified general anesthetics causing adverse effect in therapeutic use     hard to wake up  . Chronic back pain     right radicular leg pain  . Constipation     r/t pain meds and takes Dulcolax nightly  . FHx: colonic polyps     hx of and mother had colon cancer  . Chronic urinary tract infection     takes Macrodantin and Cranberry daily  . Glaucoma     uses Xalantan nightly  . Cancer     hx breast cancer-right  . Hypertension     takes Amlodipine daily  . Arthritis     degenerative arthritis    Her Past Surgical History Is Significant For: Past Surgical History  Procedure Laterality Date  . Tubal ligation  1980  . Eye surgery  2004    right eye-detached retina  . Cataract surgery  2005    right eye  . Right breast biopsy  2005  . Breast lumpectomy      x2  . Left breast biopsy  2009  . Ankle surgery  2011    left ankle with screws and plates  . Colonoscopy    . Retinal detachment surgery  2004  . Mastectomy  2005    right  d/t breast cancer;no sticks to right arm LYMPH NODES REMOVED.  . Lumbar laminectomy/decompression microdiscectomy  10/31/2011    Procedure: LUMBAR LAMINECTOMY/DECOMPRESSION MICRODISCECTOMY;  Surgeon: Alvy Beal;  Location: [facility] OR;  Service: Orthopedics;  Laterality: Right;  L4-5 Decompression with Right Facet Decompression     Her Family History Is Significant For: Family History  Problem Relation Age of Onset  . Anesthesia problems Neg Hx   . Hypotension Neg Hx   . Malignant hyperthermia Neg Hx   . Pseudochol deficiency Neg Hx   . Colon cancer Mother     Her Social History Is Significant For: History   Social History  . Marital Status: Married    Spouse Name: Barbara Rangel    Number of Children: 4  . Years of Education: BS    Occupational History  . Retired    Social History Main Topics  . Smoking status: Never Smoker   . Smokeless tobacco: Never Used  . Alcohol Use: No  . Drug Use: No  . Sexually Active: Yes   Other Topics Concern  . None   Social History Narrative   Pt lives at home with spouse.   Caffeine Use: none    Her Allergies Are:  Allergies  Allergen Reactions  . Avelox (Moxifloxacin Hcl In Nacl) Other (See Comments)    Severe headache   . Tramadol Nausea Only and Other (See Comments)    Nausea and Dizziness, too.  . Trimethoprim Other (See Comments)    Rapid heartbeat.  . Citalopram Hydrobromide     Pt can't remember what the side effect was.  . Cephalexin Other (See Comments)    Causes headache  :   Her Current Medications Are:  Outpatient Encounter Prescriptions as of 05/13/2013  Medication Sig Dispense Refill  . acetaminophen (TYLENOL) 325 MG tablet Take 1,000 mg by mouth at bedtime.      Marland Kitchen alendronate (FOSAMAX) 70 MG tablet Take 1 tablet (70 mg total) by mouth every 7 (seven) days. Hold while in hospital. Thursday.  Take with a full glass of water on an empty stomach.  4 tablet  2  . amLODipine (NORVASC) 5 MG tablet Take 1 tablet (5 mg total) by mouth daily. Take in evening or at bedtime      . anastrozole (ARIMIDEX) 1 MG tablet Take 1 tablet by mouth  daily  90 tablet  2  . aspirin EC 81 MG tablet Take 81 mg by mouth daily.      Marland Kitchen atorvastatin (LIPITOR) 20 MG tablet Take 1 tablet by mouth every morning.      . calcium citrate-vitamin D (CITRACAL+D) 315-200 MG-UNIT per tablet Take 1 tablet by mouth 2 (two) times daily.       . Cranberry Extract 250 MG TABS Take 1 capsule by mouth daily.      Marland Kitchen DIPHENHYDRAMINE HCL PO Take 25 mg by mouth at bedtime as needed.       . docusate sodium (COLACE) 100 MG capsule Take 100 mg by mouth 2 (two) times daily.        Marland Kitchen latanoprost (XALATAN) 0.005 % ophthalmic solution Place 1 drop into both eyes at bedtime.        Marland Kitchen loratadine (CLARITIN  REDITABS) 10 MG dissolvable tablet Take 10 mg by mouth as needed. For allergies      . Multiple Vitamins-Minerals (MULTIVITAMINS THER. W/MINERALS) TABS Take 1 tablet by mouth daily.        . nitrofurantoin (MACRODANTIN) 100 MG capsule Take 100 mg by mouth daily.        Marland Kitchen omeprazole (PRILOSEC) 40  MG capsule Take 40 mg by mouth daily.      Marland Kitchen OVER THE COUNTER MEDICATION Take 1 capsule by mouth daily. ellura- cranberry extract       No facility-administered encounter medications on file as of 05/13/2013.   Review of Systems  Neurological: Positive for weakness.       Memory loss  Psychiatric/Behavioral:       Decreased energy    Objective:  Neurologic Exam  Physical Exam Physical Examination:   Filed Vitals:   05/13/13 1203  BP: 130/69  Pulse: 72  Temp: 97 F (36.1 C)     On general exam: She is a very pleasant elderly lady in no acute distress.  Her MMSE is 27/30, CDT 4/4, AFT 8. HEENT exam: Normocephalic, atraumatic, pupils are equal, round and reactive to light, extraocular tracking is good without nystagmus. Hearing is intact. Speech is clear. Neck is supple with full range of motion and without carotid bruits. She has mild neck muscle tenderness and tightness particularly in the left posterior neck. She has no obvious hypertrophy in her neck muscles. She is somewhat tender to palpation to deep palpation in the left posterior neck muscles. Face is symmetric with normal facial animation. Airway exam is clear. Tongue protrudes centrally and palate elevates symmetrically. General exam is otherwise unremarkable: CTA, HS normal, N abdominal sounds. She has no pitting edema in the distal lower extremities bilaterally. Skin is warm and dry. No trophic changes are seen. No joint deformities are noted. Neurologically: Mental status: The patient is awake, alert and oriented in all 4 spheres. Her memory, attention, language and knowledge are appropriate. Cranial nerves II through XII are as  described above under HEENT exam. In addition, shoulder shrug is normal and strength. Motor exam: Normal bulk, and tone is noted. Strength exam is normal today. She has no focal atrophy, no fasciculations and no pain on deep palpation. Reflexes are 2+ in the upper extremities and only trace in the lower extremities. She says that she really never had much in the way of reflex response in the lower extremities. Cerebellar testing shows no dysmetria or intention tremor and fine motor skills are intact. She has no truncal or gait ataxia. Sensory exam is decreased to pinprick sensation in the feet .          Assessment and Plan:    In summary, Ms. Dolata is a 67 year old right-handed woman with a Hx of RUE weakness, improved, and fairly unchanged numbness of her feet, mild, stable. She has c/o memory loss, and her MMSE is 27/30, she loses 3 points for remote recall. Overall she has a fairly benign exam with the exception of evidence of neuropathy affecting the soles of her feet and mild memory loss with overall improvement in her Sx. She may have MCI, but is quite concerned about her memory. Therefore, I will go ahead a make a referral for full neuropsychological evaluation. 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 I would like to see her back in 6 months from now, sooner if the need arises. She was in agreement.

## 2013-06-08 DIAGNOSIS — R350 Frequency of micturition: Secondary | ICD-10-CM | POA: Diagnosis not present

## 2013-06-16 DIAGNOSIS — M47812 Spondylosis without myelopathy or radiculopathy, cervical region: Secondary | ICD-10-CM | POA: Diagnosis not present

## 2013-06-28 ENCOUNTER — Other Ambulatory Visit (HOSPITAL_BASED_OUTPATIENT_CLINIC_OR_DEPARTMENT_OTHER): Payer: Medicare Other

## 2013-06-28 DIAGNOSIS — C50919 Malignant neoplasm of unspecified site of unspecified female breast: Secondary | ICD-10-CM | POA: Diagnosis not present

## 2013-06-28 DIAGNOSIS — M810 Age-related osteoporosis without current pathological fracture: Secondary | ICD-10-CM | POA: Diagnosis not present

## 2013-06-28 LAB — COMPREHENSIVE METABOLIC PANEL (CC13)
ALT: 26 U/L (ref 0–55)
AST: 25 U/L (ref 5–34)
Albumin: 4 g/dL (ref 3.5–5.0)
Alkaline Phosphatase: 68 U/L (ref 40–150)
BUN: 11 mg/dL (ref 7.0–26.0)
CO2: 29 mEq/L (ref 22–29)
Calcium: 10.4 mg/dL (ref 8.4–10.4)
Chloride: 107 mEq/L (ref 98–109)
Creatinine: 0.7 mg/dL (ref 0.6–1.1)
Glucose: 81 mg/dl (ref 70–140)
Potassium: 3.9 mEq/L (ref 3.5–5.1)
Sodium: 145 mEq/L (ref 136–145)
Total Bilirubin: 0.57 mg/dL (ref 0.20–1.20)
Total Protein: 7.7 g/dL (ref 6.4–8.3)

## 2013-06-28 LAB — CBC WITH DIFFERENTIAL/PLATELET
BASO%: 0.9 % (ref 0.0–2.0)
Basophils Absolute: 0 10*3/uL (ref 0.0–0.1)
EOS%: 1.7 % (ref 0.0–7.0)
Eosinophils Absolute: 0.1 10*3/uL (ref 0.0–0.5)
HCT: 43.4 % (ref 34.8–46.6)
HGB: 14.8 g/dL (ref 11.6–15.9)
LYMPH%: 39.2 % (ref 14.0–49.7)
MCH: 32.7 pg (ref 25.1–34.0)
MCHC: 34.2 g/dL (ref 31.5–36.0)
MCV: 95.6 fL (ref 79.5–101.0)
MONO#: 0.4 10*3/uL (ref 0.1–0.9)
MONO%: 9.4 % (ref 0.0–14.0)
NEUT#: 2 10*3/uL (ref 1.5–6.5)
NEUT%: 48.8 % (ref 38.4–76.8)
Platelets: 201 10*3/uL (ref 145–400)
RBC: 4.54 10*6/uL (ref 3.70–5.45)
RDW: 13.8 % (ref 11.2–14.5)
WBC: 4.1 10*3/uL (ref 3.9–10.3)
lymph#: 1.6 10*3/uL (ref 0.9–3.3)

## 2013-06-29 LAB — CANCER ANTIGEN 27.29: CA 27.29: 26 U/mL (ref 0–39)

## 2013-06-29 LAB — VITAMIN D 25 HYDROXY (VIT D DEFICIENCY, FRACTURES): Vit D, 25-Hydroxy: 50 ng/mL (ref 30–89)

## 2013-06-30 ENCOUNTER — Ambulatory Visit: Payer: Medicare Other | Admitting: Hematology and Oncology

## 2013-07-01 ENCOUNTER — Telehealth: Payer: Self-pay | Admitting: Oncology

## 2013-07-01 DIAGNOSIS — R413 Other amnesia: Secondary | ICD-10-CM

## 2013-07-07 ENCOUNTER — Ambulatory Visit: Payer: Medicare Other

## 2013-07-07 DIAGNOSIS — H4011X0 Primary open-angle glaucoma, stage unspecified: Secondary | ICD-10-CM | POA: Diagnosis not present

## 2013-07-09 DIAGNOSIS — R413 Other amnesia: Secondary | ICD-10-CM

## 2013-07-14 ENCOUNTER — Ambulatory Visit (HOSPITAL_BASED_OUTPATIENT_CLINIC_OR_DEPARTMENT_OTHER): Payer: Medicare Other | Admitting: Oncology

## 2013-07-14 ENCOUNTER — Ambulatory Visit: Payer: Medicare Other | Admitting: Oncology

## 2013-07-14 ENCOUNTER — Telehealth: Payer: Self-pay | Admitting: Oncology

## 2013-07-14 VITALS — BP 131/78 | HR 84 | Temp 98.3°F | Resp 20 | Ht 65.0 in | Wt 134.0 lb

## 2013-07-14 DIAGNOSIS — Z1231 Encounter for screening mammogram for malignant neoplasm of breast: Secondary | ICD-10-CM

## 2013-07-14 DIAGNOSIS — C50919 Malignant neoplasm of unspecified site of unspecified female breast: Secondary | ICD-10-CM | POA: Diagnosis not present

## 2013-07-14 DIAGNOSIS — C50912 Malignant neoplasm of unspecified site of left female breast: Secondary | ICD-10-CM

## 2013-07-14 DIAGNOSIS — C50911 Malignant neoplasm of unspecified site of right female breast: Secondary | ICD-10-CM

## 2013-07-14 NOTE — Progress Notes (Signed)
OFFICE PROGRESS NOTE   07/14/2013   Physicians: Theressa Millard (PCP), Huston Foley (neurology), Lelon Perla, Diedre Avalon (gyn, [facility], [place]), [facility]  INTERVAL HISTORY:  Patient is a very pleasant 67 yo lady, seen for first time by this MD today, previously followed at this office by Dr L.Odogwu for breast cancer. She continues adjuvant Arimidex, which she seems to be tolerating generally well; plan has been to continue the aromatase inhibitor possibly for 10 years, which would be thru Jan 2016. Bone density scan at [facility] 12-29-2012 had normal bone density in both LS and hip, improved compared with 2010 and 2013. Most recent left mammogram was at [facility] 12-29-12, with heterogeneously dense breast tissue but otherwise no mammographic findings of concern; we have discussed improved imaging of dense breasts with 3D/ tomo mammography, which she should have with next mammograms. Last breast MRI in this EMR was Feb 2011. She had fasting lipids done by Dr Earl Gala with last full PE and I will request that information for our records as she is on the aromatase inhibitor; she was recently started on lipitor and ASA for plaque in carotids per her history. Patient's primary complaint today is memory difficulty and some intermittent expressive speech difficulty, which she believes may be related to chemotherapy in 2005. She believes that these symptoms have been present for past couple of years, but more noticeable to her in past year or so. She has been evaluated recently by neurologist Dr Kathie Rhodes.Athar, including neuropsychologic testing which patient tells me was good with exception of one area. She had MR brain Feb 2014 by [facility]. I do not find carotid dopplers in this EMR. Patient also complains of difficulty sleeping since back problems ~ 2012, at most getting 5 hours of sleep per night. She uses occasional benadryl at hs which is helpful. She feels that the sleep problems may be  impacting her cognitive functioning. She often has 2-3 episodes of foot and leg cramps during night, a little better with extra magnesium and tries to eat bananas; we have discussed adding at least 20 oz G2 gatorade or equivalent daily. She has occasional hot flashes, which she tolerates. She does not notice joint pain, tho has some "stiffness" across chest at times.   ONCOLOGIC HISTORY History from this EMR and patient, which I will addend if other information is available from prior EMR.  Patient found right breast cancer on breast self exam after negative mammogram same year, diagnosis June 2005 in [place], [place] ER/PR + and Her 2 negative, with patient age 48 then. She was treated with mastectomy with "9 or 11" node axillary evaluation, of which patient recalls "7 or 9" nodes were involved; she did not have local or axillary radiation. She was treated on NSABP B28 trial with dose dense adriamycin cytoxan followed by taxol, from 11-19-2003 thru 09-21-2004. She has been on adjuvant arimidex since Jan 2006. She continues Fosamax, calcium and D with the arimidex.  Review of systems as above, also: Recurrent UTIs, generally managed by gynecologist Dr Adaline Sill in [place]; states sometimes she has symptoms when cultures are negative. Vaginal dryness with arimidex.  No other recent infectious illness. Appetite good, good diet with lots of fruits and vegetables. No change in bowels. No new or different pain otherwise. Exercises daily with water aerobics at [facility]. Some environmental allergic sinus symptoms which improve with occasional benadryl at hs. Remainder of 10 point Review of Systems negative.  Past Medical/ Surgical History HTN Recurrent UTIs Glaucoma  Lumbar laminectomy and microdiscectomy 2012 Colon polyps: due 3 year follow up colonoscopy by [facility] in 2015  BTL Cataract and retinal detatchment surgeries Right mastectomy and ~ 11 node right axillary evaluation June 2005 for T2N1  right breast cancer, treated on study with dose dense chemotherapy followed by aromatase inhibitor Carotid artery disease (hx from patient)  Family History Mother with colon cancer ~ age 27. A maternal first cousin and a paternal first cousin with breast cancer.  No known family hx peripheral vascular disease 4 children, 5 grands healthy  Social Originally from [place], but lived in multiple large cities across [place] with husband's work in Print production planner. 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 No tobacco or ETOH. Lives with husband in [place], brother also here. Children are in [place], [place], [place], [place]  Objective:  Vital signs in last 24 hours:  BP 131/78  Pulse 84  Temp(Src) 98.3 F (36.8 C) (Oral)  Resp 20  Ht 5\' 5"  (1.651 m)  Wt 134 lb (60.782 kg)  BMI 22.3 kg/m2  Alert, oriented and appropriate. Ambulatory without difficulty.  Very pleasant, neatly groomed, good historian. Speech fluent thru all of our conversation.  HEENT:PERRL, sclerae not icteric. Oral mucosa moist without lesions, posterior pharynx with dull erythema consistent with post nasal drainage. Neck supple. No JVD. No apparent thyroid mass. Lymphatics cervical,suraclavicular, axillary or inguinal adenopathy Resp: clear to auscultation bilaterally and normal percussion bilaterally Cardio: regular rate and rhythm. No gallop. Back with well healed lumbar laminectomy scar, spine not tender to palpatiion GI: soft, nontender, not distended, no mass or organomegaly. Extremities: without pitting edema, cords, tenderness. No swelling RUE. Full easy ROM both shoulders. Neuro: CN, motor, sensory, cerebellar grossly nonfocal other than decreased sensation right mastectomy area and lower right axilla  Skin without rash, ecchymosis, petechiae Breast left: without dominant mass, skin or nipple findings. Right mastectomy scar soft and unremarkable. Nothing palpable right axilla. Left axilla not  remarkable.  Lab Results:  Results for orders placed in visit on 06/28/13  CBC WITH DIFFERENTIAL      Result Value Range   WBC 4.1  3.9 - 10.3 10e3/uL   NEUT# 2.0  1.5 - 6.5 10e3/uL   HGB 14.8  11.6 - 15.9 g/dL   HCT 40.9  81.1 - 91.4 %   Platelets 201  145 - 400 10e3/uL   MCV 95.6  79.5 - 101.0 fL   MCH 32.7  25.1 - 34.0 pg   MCHC 34.2  31.5 - 36.0 g/dL   RBC 7.82  9.56 - 2.13 10e6/uL   RDW 13.8  11.2 - 14.5 %   lymph# 1.6  0.9 - 3.3 10e3/uL   MONO# 0.4  0.1 - 0.9 10e3/uL   Eosinophils Absolute 0.1  0.0 - 0.5 10e3/uL   Basophils Absolute 0.0  0.0 - 0.1 10e3/uL   NEUT% 48.8  38.4 - 76.8 %   LYMPH% 39.2  14.0 - 49.7 %   MONO% 9.4  0.0 - 14.0 %   EOS% 1.7  0.0 - 7.0 %   BASO% 0.9  0.0 - 2.0 %  CANCER ANTIGEN 27.29      Result Value Range   CA 27.29 26  0 - 39 U/mL  VITAMIN D 25 HYDROXY      Result Value Range   Vit D, 25-Hydroxy 50  30 - 89 ng/mL  COMPREHENSIVE METABOLIC PANEL (CC13)      Result Value Range   Sodium 145  136 - 145 mEq/L   Potassium 3.9  3.5 - 5.1 mEq/L   Chloride 107  98 - 109 mEq/L   CO2 29  22 - 29 mEq/L   Glucose 81  70 - 140 mg/dl   BUN 54.0  7.0 - 98.1 mg/dL   Creatinine 0.7  0.6 - 1.1 mg/dL   Total Bilirubin 1.91  0.20 - 1.20 mg/dL   Alkaline Phosphatase 68  40 - 150 U/L   AST 25  5 - 34 U/L   ALT 26  0 - 55 U/L   Total Protein 7.7  6.4 - 8.3 g/dL   Albumin 4.0  3.5 - 5.0 g/dL   Calcium 47.8  8.4 - 29.5 mg/dL    Copies of all labs above given to patient. I have mentioned that routine monitoring of CA 2729 may not be useful.  Studies/Results: [facility] 12[facility]12 as above   MAMMOGRAPHIC UNILATERAL LEFT DIGITAL SCREENING WITH CAD 12-29-12 Comparison: 12/08/2008  FINDINGS:  ACR Breast Density Category 3: The breast tissue is heterogeneously  dense.  There is no suspicious dominant mass, architectural distortion, or  calcification to suggest malignancy.  Images were processed with CAD.  IMPRESSION:  No mammographic evidence  of malignancy.  RECOMMENDATION:  Screening mammogram in one year. (Code:SM-B-01Y)   Medications: I have reviewed the patient's current medications. We have discussed trying benadryl on regular basis at night for allergic drainage and to help sleep. We have discussed length of treatment on aromatase inhibitor and possibility of holding this for a few weeks to see if any of her present symptoms improve off of that. At present she prefers to continue the AI, understands that optimal length of treatment with these drugs is still under investigation, but that longer use of other hormonal blockers has shown benefit, and certainly with multiple node +  breast cancer she is at higher risk for recurrence.   Patient is comfortable with 6 month return visit here, which is my usual follow up on active AI treatment; this should be shortly after next mammogram, which I have ordered as 3D/ tomo.  Assessment/Plan:  1.T2N1 right breast cancer: diagnosed June 2005, features and treatment as above. Clinically doing well on continued arimidex. Due mammograms Feb 2015, which should be 3D/ tomo due to dense breast tissue and personal hx breast ca. Will request fasting lipid information from Dr Earl Gala. Counts good, follow with past chemotherapy. 2.complaints of memory problems, apparently without very significant findings on neurologic evaluation. I have suggested that chronic sleep deprivation may be contributing 3.hx colon polyps: on 3 year follow up by [facility] 4.up to date on gyn exams 5.recurrent UTIs, managed by gyn 6.nightly leg cramps: as above 7.carotid artery plaque per patient  Patient had questions answered to her satisfaction and is in agreement with plan above. She understands that she can call at any time if questions or concerns prior to next scheduled appointment here. I will send this note to other MDs involved.  Time spent 40 min   Neithan Day P, MD   07/14/2013, 12:16 PM

## 2013-07-14 NOTE — Patient Instructions (Signed)
You should have 3D / tomo mammograms with next ones done. These give better imaging of dense breast tissue

## 2013-07-15 ENCOUNTER — Encounter: Payer: Self-pay | Admitting: Oncology

## 2013-07-15 DIAGNOSIS — C50911 Malignant neoplasm of unspecified site of right female breast: Secondary | ICD-10-CM | POA: Insufficient documentation

## 2013-07-16 ENCOUNTER — Telehealth: Payer: Self-pay

## 2013-07-16 ENCOUNTER — Encounter: Payer: Self-pay | Admitting: Oncology

## 2013-07-16 ENCOUNTER — Ambulatory Visit: Payer: Medicare Other | Admitting: Oncology

## 2013-07-16 NOTE — Telephone Encounter (Signed)
Received recent fasting lipids from DR. Osborne dated 01-18-13.  Report given to Dr. Darrold Span.

## 2013-07-16 NOTE — Telephone Encounter (Signed)
Message copied by Lorine Bears on Fri Jul 16, 2013  1:20 PM ------      Message from: Jama Flavors P      Created: Thu Jul 15, 2013 10:11 AM       Please request most recent fasting lipids from Dr Theressa Millard. Not a hurry      Cc LA, TH ------

## 2013-07-16 NOTE — Progress Notes (Signed)
Medical Oncology  Fasting lipids received from Dr Earl Gala, all WNL from 01-18-2013: Cholesterol 152 DLDL 92 nonHDL 102 Direct HDL 50 Chol/HDL 3.0  Will be scanned into EMR also L.Darrold Span, MD

## 2013-07-20 ENCOUNTER — Telehealth: Payer: Self-pay | Admitting: Oncology

## 2013-07-20 NOTE — Telephone Encounter (Signed)
Fax medical records to Dr. Carrolyn Meiers from Dr. Darrold Span.

## 2013-07-26 DIAGNOSIS — I10 Essential (primary) hypertension: Secondary | ICD-10-CM | POA: Diagnosis not present

## 2013-07-26 DIAGNOSIS — R079 Chest pain, unspecified: Secondary | ICD-10-CM | POA: Diagnosis not present

## 2013-07-26 DIAGNOSIS — R413 Other amnesia: Secondary | ICD-10-CM | POA: Diagnosis not present

## 2013-07-26 DIAGNOSIS — I779 Disorder of arteries and arterioles, unspecified: Secondary | ICD-10-CM | POA: Diagnosis not present

## 2013-08-31 DIAGNOSIS — Z23 Encounter for immunization: Secondary | ICD-10-CM | POA: Diagnosis not present

## 2013-09-13 ENCOUNTER — Telehealth: Payer: Self-pay

## 2013-09-13 NOTE — Telephone Encounter (Signed)
Ms. Dizon called stating that she recently went to an out patient physical therapy class at [facility].  The therapist  Eulis Foster recommended vaginal physical therapy. Barbara Rangel is experiencing extreme vaginal dryness / UTI as a side-effect  from being on  Arimidex 1 mg tabs for 9 years for her breast cancer. Would Dr. Darrold Span write her a prescription for the therapy?

## 2013-09-15 ENCOUNTER — Other Ambulatory Visit: Payer: Self-pay | Admitting: Oncology

## 2013-09-16 ENCOUNTER — Other Ambulatory Visit: Payer: Self-pay

## 2013-09-17 NOTE — Telephone Encounter (Signed)
Ms. Tuckerman was calling to follow up her message earlier this week for the physical therapy.  Told her that the message was received and Dr. Darrold Span is reviewing. Ms. Benecke wanted Dr. Darrold Span to know that the therapy is supposed to help strengthen the pelvic floor muscles.

## 2013-09-20 ENCOUNTER — Other Ambulatory Visit: Payer: Self-pay | Admitting: Oncology

## 2013-09-20 DIAGNOSIS — Z9189 Other specified personal risk factors, not elsewhere classified: Secondary | ICD-10-CM | POA: Diagnosis not present

## 2013-09-20 DIAGNOSIS — Z124 Encounter for screening for malignant neoplasm of cervix: Secondary | ICD-10-CM | POA: Diagnosis not present

## 2013-09-20 NOTE — Telephone Encounter (Signed)
Spoke with Ms. Rogelio Seen and told her Dr. Darrold Span feels that the Arimidex dryness is a significant problem. Dr. Darrold Span  would like to see you back before march to discuss this issue.  There may be a better way to address this problem instead of just physicial therapy.  Ms. Ressler verbalized understanding.  An appointment was made for 10-11-13 at 1430 to see Dr. Darrold Span.

## 2013-10-05 DIAGNOSIS — H4011X0 Primary open-angle glaucoma, stage unspecified: Secondary | ICD-10-CM | POA: Diagnosis not present

## 2013-10-11 ENCOUNTER — Ambulatory Visit: Payer: Medicare Other | Admitting: Oncology

## 2013-10-11 ENCOUNTER — Encounter: Payer: Self-pay | Admitting: Oncology

## 2013-10-11 ENCOUNTER — Other Ambulatory Visit: Payer: Medicare Other | Admitting: Lab

## 2013-10-11 ENCOUNTER — Telehealth: Payer: Self-pay | Admitting: Oncology

## 2013-10-11 ENCOUNTER — Ambulatory Visit (HOSPITAL_BASED_OUTPATIENT_CLINIC_OR_DEPARTMENT_OTHER): Payer: Medicare Other | Admitting: Oncology

## 2013-10-11 VITALS — BP 129/79 | HR 87 | Temp 98.2°F | Resp 18 | Ht 65.0 in | Wt 134.6 lb

## 2013-10-11 DIAGNOSIS — C50911 Malignant neoplasm of unspecified site of right female breast: Secondary | ICD-10-CM

## 2013-10-11 DIAGNOSIS — N390 Urinary tract infection, site not specified: Secondary | ICD-10-CM

## 2013-10-11 DIAGNOSIS — C50919 Malignant neoplasm of unspecified site of unspecified female breast: Secondary | ICD-10-CM | POA: Diagnosis not present

## 2013-10-11 NOTE — Progress Notes (Signed)
OFFICE PROGRESS NOTE   10/11/2013   Physicians:James Earl Gala (PCP), Huston Foley (neurology), Lelon Perla, Diedre Bland (gyn, [facility], [place]), [facility]   INTERVAL HISTORY:  Patient is seen, alone for visit, earlier than originally scheduled to discuss referral for pelvic physical therapy and complaints of vaginal dryness. Since I met her in Sept 2014, she had regular yearly gyn exam by Dr Randa Lynn in ~ Oct, with no changes in suppressive macrodantin and no new recommendations that patient recalls. Patient attended an education session at [facility] recently, with information from physical therapy about pelvic PT that she wanted to discuss. In addition to significant vaginal dryness since she has been on the aromatase inhibitor, she has some urinary frequency also x years. She has not had nearly as much trouble with recurrent UTIs in past 1-2 years, since she has been on prophylactic macrodantin and cranberry extract from Dr Parke Simmers.  She has no new or different problems since I met her 07-14-13. She continues arimidex, begun Jan 2006 and originally planned for 10 years. Patient is reluctant to stop this prior to 10 years even with the vaginal dryness.   ONCOLOGIC HISTORY  Patient found right breast cancer on breast self exam after negative mammogram same year, diagnosis June 2005 in [place], [place] ER/PR + and Her 2 negative, with patient age 84 then. She was treated with mastectomy with "9 or 11" node axillary evaluation, of which patient recalls "7 or 9" nodes were involved; she did not have local or axillary radiation. She was treated on NSABP B28 trial with dose dense adriamycin cytoxan followed by taxol, from 11-19-2003 thru 09-21-2004. She has been on adjuvant arimidex since Jan 2006. Last bone density scan was at [facility] 12[facility]29-2012 had normal bone density in both LS and hip, improved compared with 2010 and 2013. Most recent left mammogram was at [facility] 12-29-12, with heterogeneously  dense breast tissue but otherwise no mammographic findings of concern.  Last breast MRI in this EMR was Feb 2011. She had fasting lipids done by Dr Earl Gala 01-2013 WNL.     Review of systems as above, also: No pelvic or LE pain, no other new or different pain. No changes noted on breast self exam. Appetite and energy at baseline. No bleeding. No recent infectious illness Remainder of 10 point Review of Systems negative.  Objective:  Vital signs in last 24 hours:  BP 129/79  Pulse 87  Temp(Src) 98.2 F (36.8 C) (Oral)  Resp 18  Ht 5\' 5"  (1.651 m)  Wt 134 lb 9.6 oz (61.054 kg)  BMI 22.40 kg/m2 Weight stable Alert, oriented and appropriate. Ambulatory without difficulty.    HEENT:PERRL, sclerae not icteric. Oral mucosa moist without lesions, posterior pharynx clear.  Neck supple. No JVD.  Lymphatics:no cervical,suraclavicular, axillary or inguinal adenopathy Resp: clear to auscultation bilaterally and normal percussion bilaterally Cardio: regular rate and rhythm. No gallop. GI: soft, nontender, not distended, no mass or organomegaly. Normally active bowel sounds Musculoskeletal/ Extremities: without pitting edema, cords, tenderness Neuro: no peripheral neuropathy. Otherwise nonfocal Skin without rash, ecchymosis, petechiae Breasts: Left without dominant mass, skin or nipple findings. Right mastectomy scar well healed without evidence of local recurrence. Axillae benign.   Lab Results: Labs not repeated today.  Results for orders placed in visit on 06/28/13  CBC WITH DIFFERENTIAL      Result Value Range   WBC 4.1  3.9 - 10.3 10e3/uL   NEUT# 2.0  1.5 - 6.5 10e3/uL   HGB 14.8  11.6 -  15.9 g/dL   HCT 16.1  09.6 - 04.5 %   Platelets 201  145 - 400 10e3/uL   MCV 95.6  79.5 - 101.0 fL   MCH 32.7  25.1 - 34.0 pg   MCHC 34.2  31.5 - 36.0 g/dL   RBC 4.09  8.11 - 9.14 10e6/uL   RDW 13.8  11.2 - 14.5 %   lymph# 1.6  0.9 - 3.3 10e3/uL   MONO# 0.4  0.1 - 0.9 10e3/uL   Eosinophils  Absolute 0.1  0.0 - 0.5 10e3/uL   Basophils Absolute 0.0  0.0 - 0.1 10e3/uL   NEUT% 48.8  38.4 - 76.8 %   LYMPH% 39.2  14.0 - 49.7 %   MONO% 9.4  0.0 - 14.0 %   EOS% 1.7  0.0 - 7.0 %   BASO% 0.9  0.0 - 2.0 %  CANCER ANTIGEN 27.29      Result Value Range   CA 27.29 26  0 - 39 U/mL  VITAMIN D 25 HYDROXY      Result Value Range   Vit D, 25-Hydroxy 50  30 - 89 ng/mL  COMPREHENSIVE METABOLIC PANEL (CC13)      Result Value Range   Sodium 145  136 - 145 mEq/L   Potassium 3.9  3.5 - 5.1 mEq/L   Chloride 107  98 - 109 mEq/L   CO2 29  22 - 29 mEq/L   Glucose 81  70 - 140 mg/dl   BUN 78.2  7.0 - 95.6 mg/dL   Creatinine 0.7  0.6 - 1.1 mg/dL   Total Bilirubin 2.13  0.20 - 1.20 mg/dL   Alkaline Phosphatase 68  40 - 150 U/L   AST 25  5 - 34 U/L   ALT 26  0 - 55 U/L   Total Protein 7.7  6.4 - 8.3 g/dL   Albumin 4.0  3.5 - 5.0 g/dL   Calcium 08.6  8.4 - 57.8 mg/dL     Studies/Results:  No results found.  Medications: I have reviewed the patient's current medications. Arimidex, macrodantin, cranberry extract, calcium with D and Fosamax noted.  DISCUSSION: We have discussed fact that vaginal dryness may be primarily or in part related to ongoing arimidex therapy, however she is otherwise tolerating this well for mutliple node positive breast cancer, and prefers not to try even a break off of that medication prior to planned end of treatment at 10 years. The pelvic PT would not help vaginal dryness, but may be a consideration for urinary symptoms. We will make referral to Eulis Foster at (?) [place] location for consultation visit.  Assessment/Plan: 1.T2N1 right breast cancer: diagnosed June 2005, features and treatment as above. Clinically doing well on continued arimidex. Due mammograms Feb 2015, which should be 3D/ tomo due to dense breast tissue and personal hx breast ca. I will see her again as previously scheduled ~ March 2015. 2.complaints of memory problems, apparently without very  significant findings on neurologic evaluation. I have suggested that chronic sleep deprivation may be contributing  3.hx colon polyps: on 3 year follow up by [facility]  4.vaginal dryness likely related at least in part to arimidex. As above.  5.recurrent UTIs, managed by gyn, up to date on exams at that office. Refer for pelvic PT evaluation. 6.nightly leg cramps: as above  7.carotid artery plaque per patient    Patient is comfortable with discussion and plan.    Chonita Gadea P, MD   10/11/2013, 3:31 PM

## 2013-10-11 NOTE — Patient Instructions (Signed)
Let Dr Darrold Span know if you need anything after starting the physical therapy or before next scheduled appointment

## 2013-10-12 ENCOUNTER — Other Ambulatory Visit: Payer: Self-pay | Admitting: Oncology

## 2013-10-14 ENCOUNTER — Telehealth: Payer: Self-pay | Admitting: Oncology

## 2013-10-14 ENCOUNTER — Ambulatory Visit: Payer: Medicare Other | Attending: Oncology | Admitting: Physical Therapy

## 2013-10-14 DIAGNOSIS — IMO0001 Reserved for inherently not codable concepts without codable children: Secondary | ICD-10-CM | POA: Diagnosis not present

## 2013-10-14 DIAGNOSIS — M629 Disorder of muscle, unspecified: Secondary | ICD-10-CM | POA: Diagnosis not present

## 2013-10-14 DIAGNOSIS — M2420 Disorder of ligament, unspecified site: Secondary | ICD-10-CM | POA: Insufficient documentation

## 2013-10-14 NOTE — Telephone Encounter (Signed)
per 12/2 POF appts made LVMM on pts home phone March 2015 Cal mailed to pt shh

## 2013-10-20 ENCOUNTER — Telehealth: Payer: Self-pay

## 2013-10-20 NOTE — Telephone Encounter (Signed)
Faxed signed orders dated 10-18-13 to [facility] rehabilitation.  Sent a copy to HIM to be scanned into patient's EMR.

## 2013-10-21 ENCOUNTER — Ambulatory Visit: Payer: Medicare Other | Admitting: Physical Therapy

## 2013-10-28 ENCOUNTER — Ambulatory Visit: Payer: Medicare Other | Admitting: Physical Therapy

## 2013-11-01 ENCOUNTER — Other Ambulatory Visit: Payer: Self-pay | Admitting: *Deleted

## 2013-11-01 DIAGNOSIS — C50911 Malignant neoplasm of unspecified site of right female breast: Secondary | ICD-10-CM

## 2013-11-01 MED ORDER — ANASTROZOLE 1 MG PO TABS: ORAL_TABLET | ORAL | Status: DC

## 2013-11-10 ENCOUNTER — Ambulatory Visit: Payer: Medicare Other | Admitting: Physical Therapy

## 2013-11-15 ENCOUNTER — Encounter: Payer: Self-pay | Admitting: Neurology

## 2013-11-15 ENCOUNTER — Ambulatory Visit (INDEPENDENT_AMBULATORY_CARE_PROVIDER_SITE_OTHER): Payer: Medicare Other | Admitting: Neurology

## 2013-11-15 VITALS — BP 126/80 | HR 85 | Temp 98.2°F | Ht 65.0 in | Wt 136.0 lb

## 2013-11-15 DIAGNOSIS — Z853 Personal history of malignant neoplasm of breast: Secondary | ICD-10-CM

## 2013-11-15 DIAGNOSIS — G609 Hereditary and idiopathic neuropathy, unspecified: Secondary | ICD-10-CM

## 2013-11-15 DIAGNOSIS — R413 Other amnesia: Secondary | ICD-10-CM

## 2013-11-15 DIAGNOSIS — M47812 Spondylosis without myelopathy or radiculopathy, cervical region: Secondary | ICD-10-CM

## 2013-11-15 NOTE — Progress Notes (Signed)
Subjective:    Patient ID: Barbara Rangel is a 68 y.o. female.  HPI  Interim history:   Barbara Rangel is a very pleasant 68 year old right-handed woman who presents for followup consultation of Barbara Rangel right upper extremity weakness and peripheral neuropathy. I last saw Barbara Rangel on 05/13/13, at which time I suggested neurocognitive testing because of Barbara Rangel complaint of memory loss. Barbara Rangel MMSE at that time was 27/30. Barbara Rangel neuropathy was stable and Barbara Rangel right upper extremity weakness was improved at the time after PT.  She had cognitive testing on 07/01/2013 and 07/09/2013 respectively and I reviewed the test results on 07/12/2013. Essentially she had a normal neurocognitive test results. I reviewed the findings with Barbara Rangel today and provided Barbara Rangel with a copy of the test results.   Today, she reports feeling stable and overall improved from when I first saw Barbara Rangel. She takes Benadryl for sleep and occasional tylenol at night. She does not sleep more than 6 hours. She lost Barbara Rangel son in October 2014, which of course, has been stressful for Barbara Rangel. She has been raising Barbara Rangel now 68 year old grandson for the past 5 years.   I first met Barbara Rangel on 12/21/2012 at which time I suggested a brain and C-spine MRI. She was then seen back on 01/20/2013, at which time we went over Barbara Rangel test results. Barbara Rangel C-spine MRI showed disc bulging with facet hypertrophy and mild spinal stenosis and severe biforaminal stenosis at C6-7, disc bulging with slight contact upon anterior spinal cord, facet hypertrophy with severe biforaminal stenosis at C5-6, disc bulging with facet hypertrophy and biforaminal stenosis at C4-5. Barbara Rangel brain MRI with and without contrast on 01/05/2013 showed few punctate foci of nonspecific lesions in the right greater than left subcortical white matter, no enhancing lesions. She has an underlying history of breast cancer diagnosed in 2005 and treated with lumpectomy, then mastectomy and high-dose chemotherapy as part of clinical trial. 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 She has a history of head injury in November 2013. I suggested a neurosurgical consultation for Barbara Rangel neck degenerative disc disease for symptomatic treatment of Barbara Rangel neck muscle spasms I suggested a trial of baclofen.  She saw Dr. Trenton Gammon for Barbara Rangel degenerative neck d/s and went through PT for 12 weeks, which helped. She was having a lot of leg cramps and these improved with drinking coconut water, and taking Mg 250 mg daily, and mustard.  She had C. Doppler study and was told she had mild plaques. She has since then been started on ASA 81 mg and generic Lipitor at 20 mg.   Barbara Rangel Past Medical History Is Significant For: Past Medical History  Diagnosis Date  . PONV (postoperative nausea and vomiting)   . Other and unspecified general anesthetics causing adverse effect in therapeutic use     hard to wake up  . Chronic back pain     right radicular leg pain  . Constipation     r/t pain meds and takes Dulcolax nightly  . FHx: colonic polyps     hx of and mother had colon cancer  . Chronic urinary tract infection     takes Macrodantin and Cranberry daily  . Glaucoma     uses Xalantan nightly  . Cancer     hx breast cancer-right  . Hypertension     takes Amlodipine daily  . Arthritis     degenerative arthritis    Barbara Rangel Past Surgical History Is Significant For: Past Surgical History  Procedure Laterality Date  .  Tubal ligation  1980  . Eye surgery  2004    right eye-detached retina  . Cataract surgery  2005    right eye  . Right breast biopsy  2005  . Breast lumpectomy      x2  . Left breast biopsy  2009  . Ankle surgery  2011    left ankle with screws and plates  . Colonoscopy    . Retinal detachment surgery  2004  . Mastectomy  2005    right  d/t breast cancer;no sticks to right arm LYMPH NODES REMOVED.  . Lumbar laminectomy/decompression microdiscectomy  10/31/2011    Procedure: LUMBAR LAMINECTOMY/DECOMPRESSION MICRODISCECTOMY;   Surgeon: Dahlia Bailiff;  Location: [facility];  Service: Orthopedics;  Laterality: Right;  L4-5 Decompression with Right Facet Decompression     Barbara Rangel Family History Is Significant For: Family History  Problem Relation Age of Onset  . Anesthesia problems Neg Hx   . Hypotension Neg Hx   . Malignant hyperthermia Neg Hx   . Pseudochol deficiency Neg Hx   . Colon cancer Mother     Barbara Rangel Social History Is Significant For: History   Social History  . Marital Status: Married    Spouse Name: Doren Custard    Number of Children: 4  . Years of Education: BS   Occupational History  . Retired    Social History Main Topics  . Smoking status: Never Smoker   . Smokeless tobacco: Never Used  . Alcohol Use: No  . Drug Use: No  . Sexual Activity: Yes   Other Topics Concern  . None   Social History Narrative   Pt lives at home with spouse.   Caffeine Use: none    Barbara Rangel Allergies Are:  Allergies  Allergen Reactions  . Avelox [Moxifloxacin Hcl In Nacl] Other (See Comments)    Severe headache   . Tramadol Nausea Only and Other (See Comments)    Nausea and Dizziness, too.  . Trimethoprim Other (See Comments)    Rapid heartbeat.  . Citalopram Hydrobromide     Pt can't remember what the side effect was.  . Cephalexin Other (See Comments)    Causes headache  :   Barbara Rangel Current Medications Are:  Outpatient Encounter Prescriptions as of 11/15/2013  Medication Sig  . acetaminophen (TYLENOL) 500 MG tablet Take 1,000 mg by mouth at bedtime.  Marland Kitchen alendronate (FOSAMAX) 70 MG tablet Take 1 tablet (70 mg total) by mouth every 7 (seven) days. Hold while in hospital. Thursday.  Take with a full glass of water on an empty stomach.  Marland Kitchen amLODipine (NORVASC) 5 MG tablet Take 1 tablet (5 mg total) by mouth daily. Take in evening or at bedtime  . anastrozole (ARIMIDEX) 1 MG tablet Take 1 tablet by mouth  daily  . aspirin EC 81 MG tablet Take 81 mg by mouth daily.  Marland Kitchen atorvastatin (LIPITOR) 20 MG tablet Take 1 tablet  by mouth every morning.  . Biotin 5 MG CAPS Take 5 mg by mouth daily.  . calcium citrate-vitamin D (CITRACAL+D) 315-200 MG-UNIT per tablet Take 1 tablet by mouth 2 (two) times daily.   . Cranberry Extract 250 MG TABS Take 1 capsule by mouth daily.  Marland Kitchen DIPHENHYDRAMINE HCL PO Take 25 mg by mouth at bedtime as needed.   . docusate sodium (COLACE) 100 MG capsule Take 100 mg by mouth 2 (two) times daily.    Marland Kitchen latanoprost (XALATAN) 0.005 % ophthalmic solution Place 1 drop into both eyes at bedtime.    Marland Kitchen  loratadine (CLARITIN REDITABS) 10 MG dissolvable tablet Take 10 mg by mouth as needed. For allergies  . Magnesium 250 MG TABS Take 250 mg by mouth daily.  . Multiple Vitamins-Minerals (MULTIVITAMINS THER. W/MINERALS) TABS Take 1 tablet by mouth daily.    . nitrofurantoin (MACRODANTIN) 100 MG capsule Take 100 mg by mouth daily.    Marland Kitchen omeprazole (PRILOSEC) 40 MG capsule Take 40 mg by mouth daily.   Review of Systems:  Out of a complete 14 Rangel review of systems, all are reviewed and negative with the exception of these symptoms as listed below:   Review of Systems  HENT: Negative.   Eyes: Negative.   Respiratory: Negative.   Cardiovascular: Negative.   Gastrointestinal: Negative.   Endocrine: Negative.   Genitourinary: Negative.   Musculoskeletal: Positive for myalgias.  Skin: Negative.   Allergic/Immunologic: Negative.   Neurological: Positive for weakness and numbness.       Memory loss  Hematological: Negative.   Psychiatric/Behavioral: Positive for sleep disturbance (frequent waking).    Objective:  Neurologic Exam  Physical Exam Physical Examination:   Filed Vitals:   11/15/13 1205  BP: 126/80  Pulse: 85  Temp: 98.2 F (36.8 C)   On general exam: She is a very pleasant elderly lady in no acute distress.  Barbara Rangel MMSE on 05/13/13: 27/30, CDT 4/4, AFT 8. HEENT exam: Normocephalic, atraumatic, pupils are equal, round and reactive to light, extraocular tracking is good without  nystagmus. Hearing is intact. Speech is clear. Neck is supple with full range of motion and without carotid bruits. She has mild neck muscle tenderness and tightness particularly in the left posterior neck. She has no obvious hypertrophy in Barbara Rangel neck muscles. She is somewhat tender to palpation to deep palpation in the left posterior neck muscles. Face is symmetric with normal facial animation. Airway exam is clear with very mild pharyngeal erythema noted. Tongue protrudes centrally and palate elevates symmetrically. General exam is otherwise unremarkable: Chest is clear to auscultation, heart sounds are normal and abdominal sounds are normal as well, abdomen is soft and nontender. She has no pitting edema in the distal lower extremities bilaterally. Skin is warm and dry. No trophic changes are seen. No joint deformities are noted. Neurologically: Mental status: The patient is awake, alert and oriented in all 4 spheres. Barbara Rangel memory, attention, language and knowledge are appropriate. Cranial nerves II through XII are as described above under HEENT exam. In addition, shoulder shrug is normal and strength. Motor exam: Normal bulk, and tone is noted. Strength exam is normal today. She has no focal atrophy, no fasciculations and no pain on deep palpation. Reflexes are 2+ in the upper extremities, 1+ in Barbara Rangel knees and absent in Barbara Rangel ankles. Cerebellar testing shows no dysmetria or intention tremor and fine motor skills are intact. She has no truncal or gait ataxia. Sensory exam is decreased to pinprick sensation in the feet, unchanged.          Assessment and Plan:   In summary, Barbara Rangel is a 68 year old right-handed woman with a Hx of RUE weakness, improved, and fairly unchanged numbness of Barbara Rangel feet, mild, stable. She has c/o memory loss, and Barbara Rangel MMSE was 27/30, and she had a normal neuropsychological test in 8/14. I reassured Barbara Rangel in that regard. Overall, she feels that she is doing better. She has stable  neuropathy. She has new problems with the exception of increase in stress about 3 months ago at the time of Barbara Rangel son's unexpected death. She  has degenerative cervical spine disease and has done well with physical therapy through Barbara Rangel neurosurgeon.  I asked Barbara Rangel to stay active mentally and physically. She is going to restart Barbara Rangel or put therapy. We talked about maintaining a healthy lifestyle in general. I do not think she needs any new testing or new medication at this time. I would like to see Barbara Rangel back in 6 months from now, sooner if the need arises. She was in agreement.

## 2013-11-15 NOTE — Patient Instructions (Addendum)
I think overall you are doing fairly well and are stable at this point.   I do have some generic suggestions for you today:   Please make sure that you drink plenty of fluids. I would like for you to exercise daily for example in the form of walking 20-30 minutes every day, if you can. Please keep a regular sleep-wake schedule, keep regular meal times, do not skip any meals, eat  healthy snacks in between meals, such as fruit or nuts. Try to eat protein with every meal.   As far as your medications are concerned, I would like to suggest: no new medications.    As far as diagnostic testing, I recommend: no new test today.  Engage in social activities in your community and with your family and try to keep up with current events by reading the newspaper or watching the news.  I do not think we need to make any changes in your medications at this point. I think you're stable enough that I can see you back in 6 months, sooner if we need to. Please call us if you have any interim questions, concerns, or problems or updates to need to discuss.  Richardson Landry is my clinical assistant and will answer any of your questions and relay your messages to me and will give you my messages.   Our phone number is [phone]. We also have an after hours call service for urgent matters and there is a physician on-call for urgent questions. For any emergencies you know to call 911 or go to the nearest emergency room.

## 2013-11-25 ENCOUNTER — Ambulatory Visit: Payer: Medicare Other | Attending: Oncology | Admitting: Physical Therapy

## 2013-11-25 DIAGNOSIS — M629 Disorder of muscle, unspecified: Secondary | ICD-10-CM | POA: Insufficient documentation

## 2013-11-25 DIAGNOSIS — M2420 Disorder of ligament, unspecified site: Secondary | ICD-10-CM | POA: Insufficient documentation

## 2013-11-25 DIAGNOSIS — IMO0001 Reserved for inherently not codable concepts without codable children: Secondary | ICD-10-CM | POA: Insufficient documentation

## 2013-12-02 ENCOUNTER — Ambulatory Visit: Payer: Medicare Other | Admitting: Physical Therapy

## 2013-12-07 ENCOUNTER — Other Ambulatory Visit: Payer: Self-pay

## 2013-12-07 DIAGNOSIS — Z09 Encounter for follow-up examination after completed treatment for conditions other than malignant neoplasm: Secondary | ICD-10-CM

## 2013-12-07 DIAGNOSIS — C50911 Malignant neoplasm of unspecified site of right female breast: Secondary | ICD-10-CM

## 2013-12-07 NOTE — Progress Notes (Signed)
Left message for Barbara Rangel that she does need a bone density study yearly as she is on high rist med for osteoporosis-Arimidex. Current order placed for 12-31-13 as Dr. Hollice Espy order to exprie this month and Dr. Marko Plume current provider.

## 2013-12-09 ENCOUNTER — Ambulatory Visit: Payer: Medicare Other | Admitting: Physical Therapy

## 2013-12-16 ENCOUNTER — Ambulatory Visit: Payer: Medicare Other | Attending: Oncology | Admitting: Physical Therapy

## 2013-12-16 ENCOUNTER — Telehealth: Payer: Self-pay

## 2013-12-16 DIAGNOSIS — M2420 Disorder of ligament, unspecified site: Secondary | ICD-10-CM | POA: Insufficient documentation

## 2013-12-16 DIAGNOSIS — M629 Disorder of muscle, unspecified: Secondary | ICD-10-CM | POA: Insufficient documentation

## 2013-12-16 DIAGNOSIS — IMO0001 Reserved for inherently not codable concepts without codable children: Secondary | ICD-10-CM | POA: Insufficient documentation

## 2013-12-16 NOTE — Telephone Encounter (Signed)
Faxed signed Physician Treatment Plan dated 12-16-13 to [facility] at [place]. Sent a copy to be scanned into patient's EMR.

## 2013-12-22 ENCOUNTER — Encounter: Payer: Self-pay | Admitting: Podiatry

## 2013-12-22 ENCOUNTER — Ambulatory Visit (INDEPENDENT_AMBULATORY_CARE_PROVIDER_SITE_OTHER): Payer: Medicare Other | Admitting: Podiatry

## 2013-12-22 VITALS — BP 119/81 | HR 78 | Resp 15 | Ht 65.0 in | Wt 133.0 lb

## 2013-12-22 DIAGNOSIS — M79609 Pain in unspecified limb: Secondary | ICD-10-CM

## 2013-12-22 DIAGNOSIS — B351 Tinea unguium: Secondary | ICD-10-CM | POA: Diagnosis not present

## 2013-12-22 NOTE — Progress Notes (Signed)
   Subjective:    Patient ID: Barbara Rangel, female    DOB: 1946/03/25, 68 y.o.   MRN: [id number]  HPI N painful toenails        L all toenails except left 4th toenail        D and O thick for years        C thick, incurvated and painful        A shoe wear and fungal toenails        T treatment for fungal with gel    Review of Systems  All other systems reviewed and are negative.       Objective:   Physical Exam Orientated x3 female  Vascular: DP and PT pulses 2/4 bilaterally  Neurological: Sensation intact  Dermatological: Incurvated, hypertrophic, toenails 6-10  Musculoskeletal: No restriction ankle subtalar, midtarsal joints, bilaterally       Assessment & Plan:   Assessment: Symptomatic onychomycoses 6-10  Plan: Nails x10 debrided without any bleeding. Reappoint when necessary or at three-month intervals.

## 2013-12-23 ENCOUNTER — Encounter: Payer: Self-pay | Admitting: Podiatry

## 2013-12-23 ENCOUNTER — Ambulatory Visit: Payer: Medicare Other | Admitting: Physical Therapy

## 2013-12-24 ENCOUNTER — Other Ambulatory Visit: Payer: Self-pay | Admitting: Oncology

## 2013-12-28 ENCOUNTER — Telehealth: Payer: Self-pay | Admitting: Oncology

## 2013-12-28 NOTE — Telephone Encounter (Signed)
, °

## 2013-12-30 ENCOUNTER — Ambulatory Visit: Payer: Medicare Other | Admitting: Physical Therapy

## 2013-12-31 ENCOUNTER — Ambulatory Visit
Admission: RE | Admit: 2013-12-31 | Discharge: 2013-12-31 | Disposition: A | Discharge status: Home / Self Care | Payer: Medicare Other | Source: Ambulatory Visit | Attending: Oncology | Admitting: Oncology

## 2013-12-31 ENCOUNTER — Ambulatory Visit
Admission: RE | Admit: 2013-12-31 | Discharge: 2013-12-31 | Disposition: A | Discharge status: Home / Self Care | Payer: Medicare Other | Source: Ambulatory Visit

## 2013-12-31 ENCOUNTER — Ambulatory Visit: Payer: Medicare Other

## 2013-12-31 ENCOUNTER — Other Ambulatory Visit: Payer: Self-pay

## 2013-12-31 DIAGNOSIS — Z09 Encounter for follow-up examination after completed treatment for conditions other than malignant neoplasm: Secondary | ICD-10-CM

## 2013-12-31 DIAGNOSIS — Z1231 Encounter for screening mammogram for malignant neoplasm of breast: Secondary | ICD-10-CM | POA: Diagnosis not present

## 2013-12-31 DIAGNOSIS — C50911 Malignant neoplasm of unspecified site of right female breast: Secondary | ICD-10-CM

## 2013-12-31 DIAGNOSIS — M899 Disorder of bone, unspecified: Secondary | ICD-10-CM | POA: Diagnosis not present

## 2013-12-31 DIAGNOSIS — M949 Disorder of cartilage, unspecified: Secondary | ICD-10-CM | POA: Diagnosis not present

## 2014-01-03 ENCOUNTER — Ambulatory Visit: Payer: Medicare Other | Admitting: Physical Therapy

## 2014-01-14 DIAGNOSIS — H4011X0 Primary open-angle glaucoma, stage unspecified: Secondary | ICD-10-CM | POA: Diagnosis not present

## 2014-01-19 ENCOUNTER — Other Ambulatory Visit: Payer: Medicare Other

## 2014-01-19 ENCOUNTER — Ambulatory Visit: Payer: Medicare Other | Admitting: Oncology

## 2014-01-20 DIAGNOSIS — I779 Disorder of arteries and arterioles, unspecified: Secondary | ICD-10-CM | POA: Diagnosis not present

## 2014-01-20 DIAGNOSIS — Z0000 Encounter for general adult medical examination without abnormal findings: Secondary | ICD-10-CM | POA: Diagnosis not present

## 2014-01-20 DIAGNOSIS — I10 Essential (primary) hypertension: Secondary | ICD-10-CM | POA: Diagnosis not present

## 2014-01-20 DIAGNOSIS — Z23 Encounter for immunization: Secondary | ICD-10-CM | POA: Diagnosis not present

## 2014-01-20 DIAGNOSIS — Z86010 Personal history of colonic polyps: Secondary | ICD-10-CM | POA: Diagnosis not present

## 2014-01-31 ENCOUNTER — Other Ambulatory Visit: Payer: Self-pay | Admitting: Oncology

## 2014-02-02 ENCOUNTER — Ambulatory Visit (HOSPITAL_BASED_OUTPATIENT_CLINIC_OR_DEPARTMENT_OTHER): Payer: Medicare Other | Admitting: Oncology

## 2014-02-02 ENCOUNTER — Other Ambulatory Visit (HOSPITAL_BASED_OUTPATIENT_CLINIC_OR_DEPARTMENT_OTHER): Payer: Medicare Other

## 2014-02-02 ENCOUNTER — Encounter: Payer: Self-pay | Admitting: Oncology

## 2014-02-02 VITALS — BP 141/79 | HR 97 | Temp 97.5°F | Resp 20 | Ht 65.0 in | Wt 135.2 lb

## 2014-02-02 DIAGNOSIS — E870 Hyperosmolality and hypernatremia: Secondary | ICD-10-CM

## 2014-02-02 DIAGNOSIS — C50919 Malignant neoplasm of unspecified site of unspecified female breast: Secondary | ICD-10-CM

## 2014-02-02 DIAGNOSIS — C50911 Malignant neoplasm of unspecified site of right female breast: Secondary | ICD-10-CM

## 2014-02-02 DIAGNOSIS — M899 Disorder of bone, unspecified: Secondary | ICD-10-CM

## 2014-02-02 DIAGNOSIS — C50912 Malignant neoplasm of unspecified site of left female breast: Secondary | ICD-10-CM

## 2014-02-02 DIAGNOSIS — M949 Disorder of cartilage, unspecified: Secondary | ICD-10-CM

## 2014-02-02 LAB — CBC WITH DIFFERENTIAL/PLATELET
BASO%: 0.5 % (ref 0.0–2.0)
Basophils Absolute: 0 10*3/uL (ref 0.0–0.1)
EOS%: 1.2 % (ref 0.0–7.0)
Eosinophils Absolute: 0.1 10*3/uL (ref 0.0–0.5)
HCT: 44 % (ref 34.8–46.6)
HGB: 14.7 g/dL (ref 11.6–15.9)
LYMPH%: 23.6 % (ref 14.0–49.7)
MCH: 32.5 pg (ref 25.1–34.0)
MCHC: 33.5 g/dL (ref 31.5–36.0)
MCV: 97 fL (ref 79.5–101.0)
MONO#: 0.6 10*3/uL (ref 0.1–0.9)
MONO%: 9.3 % (ref 0.0–14.0)
NEUT#: 4 10*3/uL (ref 1.5–6.5)
NEUT%: 65.4 % (ref 38.4–76.8)
Platelets: 215 10*3/uL (ref 145–400)
RBC: 4.54 10*6/uL (ref 3.70–5.45)
RDW: 14.2 % (ref 11.2–14.5)
WBC: 6.1 10*3/uL (ref 3.9–10.3)
lymph#: 1.4 10*3/uL (ref 0.9–3.3)

## 2014-02-02 LAB — COMPREHENSIVE METABOLIC PANEL (CC13)
ALT: 27 U/L (ref 0–55)
AST: 27 U/L (ref 5–34)
Albumin: 4.2 g/dL (ref 3.5–5.0)
Alkaline Phosphatase: 76 U/L (ref 40–150)
Anion Gap: 13 mEq/L — ABNORMAL HIGH (ref 3–11)
BUN: 18.7 mg/dL (ref 7.0–26.0)
CO2: 27 mEq/L (ref 22–29)
Calcium: 10.5 mg/dL — ABNORMAL HIGH (ref 8.4–10.4)
Chloride: 107 mEq/L (ref 98–109)
Creatinine: 0.8 mg/dL (ref 0.6–1.1)
Glucose: 86 mg/dl (ref 70–140)
Potassium: 4.2 mEq/L (ref 3.5–5.1)
Sodium: 146 mEq/L — ABNORMAL HIGH (ref 136–145)
Total Bilirubin: 0.46 mg/dL (ref 0.20–1.20)
Total Protein: 7.5 g/dL (ref 6.4–8.3)

## 2014-02-02 NOTE — Progress Notes (Signed)
OFFICE PROGRESS NOTE   02/02/2014   Physicians:James Maxwell Caul (PCP), Star Age (neurology), Deri Fuelling, Diedre Bland (gyn, [facility], [place]), [facility]   INTERVAL HISTORY:   Patient is seen, alone for visit, in scheduled follow up of node postitve right breast cancer, for which she continues adjuvant Arimidex, this begun Jan 2006 after completion of aduvant chemotherapy. Most recent tomo left mammogram was at [facility] 12-31-13, with no mammographic findings of concern. She had DEXA scan at [facility] 01-18-14 which was stable compared with 12-2012, with 6% increase in bone density at hip and 3% decrease in spine as compared with 2010. She continues weekly Fosamax with calcium + D. Patient completed pelvic physical therapy by PT Earlie Counts in Feb 2015, this very helpful with symptoms. She continues home exercises as instructed by PT.  Patient otherwise has felt well, with no concerns that seem referable to the breast cancer history or that treatment. No significant arthralgia symptoms with the arimidex.    ONCOLOGIC HISTORY Patient found right breast cancer on breast self exam after negative mammogram same year, diagnosis June 2005 in [place], [place] ER/PR + and Her 2 negative, with patient age 68 then. She was treated with mastectomy with "9 or 11" node axillary evaluation, of which patient recalls "7 or 9" nodes were involved; she did not have local or axillary radiation. She was treated on NSABP B28 trial with dose dense adriamycin cytoxan followed by taxol, from 11-19-2003 thru 09-21-2004. She has been on adjuvant arimidex since Jan 2006. Last bone density scan was at [facility] 12-29-2012 had normal bone density in both LS and hip, improved compared with 2010 and 2013. Most recent left mammogram was at [facility] 12-29-12, with heterogeneously dense breast tissue but otherwise no mammographic findings of concern. Last breast MRI in this EMR was Feb 2011. She had fasting lipids  done by Dr Maxwell Caul 01-2013 WNL.   Review of systems as above, also: No recent infectious illness. No new or different pain. No bleeding. Good energy from early AM until ~ 5 PM, then tired. Appetite at baseline. No SOB or other respiratory symptoms. No LE swelling. No GERD or other GI symptoms with Fosamax.  Remainder of 10 point Review of Systems negative.  Objective:  Vital signs in last 24 hours:  BP 141/79  Pulse 97  Temp(Src) 97.5 F (36.4 C) (Oral)  Resp 20  Ht 5\' 5"  (1.651 m)  Wt 135 lb 3.2 oz (61.326 kg)  BMI 22.50 kg/m2 Weight up 1 lb  Alert, oriented and appropriate. Ambulatory without difficulty.  Looks comfortable, very pleasant and good historian.  HEENT:PERRL, sclerae not icteric. Oral mucosa moist without lesions, posterior pharynx clear.  Neck supple. No JVD.  Lymphatics:no cervical,suraclavicular, axillary or inguinal adenopathy Resp: clear to auscultation bilaterally and normal percussion bilaterally Cardio: regular rate and rhythm. No gallop. GI: soft, nontender, not distended, no mass or organomegaly. Normally active bowel sounds.  Musculoskeletal/ Extremities: without pitting edema, cords, tenderness. No swelling bilat UE. Neuro: nonfocal PSYCH normal mood and affect Skin without rash, ecchymosis, petechiae Breasts: Right mastectomy scar without evidence of local recurrence.  left without dominant mass, skin or nipple findings. Axillae benign.   Lab Results:  Results for orders placed in visit on 02/02/14  CBC WITH DIFFERENTIAL      Result Value Ref Range   WBC 6.1  3.9 - 10.3 10e3/uL   NEUT# 4.0  1.5 - 6.5 10e3/uL   HGB 14.7  11.6 - 15.9 g/dL  HCT 44.0  34.8 - 46.6 %   Platelets 215  145 - 400 10e3/uL   MCV 97.0  79.5 - 101.0 fL   MCH 32.5  25.1 - 34.0 pg   MCHC 33.5  31.5 - 36.0 g/dL   RBC 4.54  3.70 - 5.45 10e6/uL   RDW 14.2  11.2 - 14.5 %   lymph# 1.4  0.9 - 3.3 10e3/uL   MONO# 0.6  0.1 - 0.9 10e3/uL   Eosinophils Absolute 0.1  0.0 - 0.5  10e3/uL   Basophils Absolute 0.0  0.0 - 0.1 10e3/uL   NEUT% 65.4  38.4 - 76.8 %   LYMPH% 23.6  14.0 - 49.7 %   MONO% 9.3  0.0 - 14.0 %   EOS% 1.2  0.0 - 7.0 %   BASO% 0.5  0.0 - 2.0 %   CMET available after visit with Na 146, Ca 10.5 otherwise normal including alb 4.2, all LFTs, creat 0.8, glucose 86, K 4.2  Studies/Results: DUAL X-RAY ABSORPTIOMETRY (DXA) FOR BONE MINERAL DENSITY  01-18-14 FINDINGS:  AP LUMBAR SPINE L1-L3  Bone Mineral Density (BMD): 0.987 g/cm2  Young Adult T-Score: -0.3  Z-Score: 0.8  Left FEMUR neck  Bone Mineral Density (BMD): 0.703 g/cm2  Young Adult T-Score: -1.3  Z-Score: -0.4  ASSESSMENT: Patient's diagnostic category is low bone mass by WHO  Criteria.  FRACTURE RISK: Moderate  FRAX: World Health Organization FRAX assessment of absolute fracture  risk is not calculated for this patient because the patient has  currently on bone building therapy.  COMPARISON: Multiple priors including 12/09/2008, 12/22/2009,  12/25/2010, 12/27/2011 and 12/29/2012. Interval statistically  significant increase in bone mineral density at the left hip by 6%  since the baseline study. Interval statistically significant  decrease in bone mineral density of the spine since the baseline  study of 3%. No significant interval change since the most recent  exam at the spine or left hip.   LEFT DIGITAL SCREENING MAMMOGRAM WITH CAD 12-31-2013 COMPARISON: Previous exam(s)  ACR Breast Density Category b: There are scattered areas of  fibroglandular density.  FINDINGS:  The patient has had a right mastectomy. There are no findings  suspicious for malignancy. Images were processed with CAD.  IMPRESSION:  No mammographic evidence of malignancy. A result letter of this  screening mammogram will be mailed directly to the patient.  RECOMMENDATION:  Screening mammogram in one year. (Code:SM-B-01Y)  BI-RADS CATEGORY 1: Negative.    Medications: I have reviewed the patient's current  medications and confirmed the calcium 315 mg/ 200u bid.   DISCUSSION: bone density and left mammogram information reviewed as above.She is in agreement with continuing arimidex given this bone density information, possibly for total of 10 years. While this length of treatment is not standard recommendation at least as yet, there is support for more extended AI treatment in high risk patients who are tolerating the medication well.  I will see her again in 6 mo or sooner if needed. I have encouraged weight bearing exercise.  Assessment/Plan:  1.T2N1 right breast cancer: diagnosed June 2005, features and treatment as above. Clinically doing well on continued arimidex, including by bone density scan and recent left mammogram. Return visit 6 mo. Fasting lipids by PCP  2.improvement in pelvic symptoms with pelvic physical therapy 3.hx colon polyps: on 3 year follow up by [facility]  4.hx recurrent UTIs, no symptoms currently 5.mild hypernatremia and slightly elevated calcium: may need to increase free water. This information to PCP also.  6.less urinary frequency at night, sleep improved. 7.osteopenia, stable since 2014. Continue all interventions to maintain/ improve bone density, which are particularly important given ongoing therapy with high risk aromatase inhibitor. Medically necessary to have bone density scans yearly on the aromatase inhibitor.  Patient is comfortable with discussion and plan.     Timmia Cogburn P, MD   02/02/2014, 8:52 AM

## 2014-02-02 NOTE — Patient Instructions (Addendum)
Continue calcium and D in addition to your Fosamax  Call if needed prior to scheduled return visi

## 2014-02-03 ENCOUNTER — Telehealth: Payer: Self-pay | Admitting: *Deleted

## 2014-02-03 NOTE — Telephone Encounter (Signed)
Per 3/25 POF Dr. Marko Plume has no openings at this time. POF copied and given to [facility].

## 2014-03-16 ENCOUNTER — Ambulatory Visit (INDEPENDENT_AMBULATORY_CARE_PROVIDER_SITE_OTHER): Payer: Medicare Other | Admitting: Podiatry

## 2014-03-16 ENCOUNTER — Encounter: Payer: Self-pay | Admitting: Podiatry

## 2014-03-16 VITALS — BP 127/75 | HR 79 | Resp 15 | Ht 65.0 in | Wt 132.0 lb

## 2014-03-16 DIAGNOSIS — M79609 Pain in unspecified limb: Secondary | ICD-10-CM

## 2014-03-16 DIAGNOSIS — B351 Tinea unguium: Secondary | ICD-10-CM

## 2014-03-16 NOTE — Patient Instructions (Signed)
Discussed the possibility of oral terbinafine 250 mg by mouth Daily x90 days for treatment of fungal toenails

## 2014-03-17 NOTE — Progress Notes (Signed)
Patient ID: Barbara Rangel, female   DOB: 1946/08/12, 68 y.o.   MRN: [id number]  Subjective: This patient presents complaining of painful toenails. Patient also wanted other options for treatment of mycotic toenails.  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 Reappoint at 3 1/2 month intervals. Discussed the use of possible oral terbinafine with patient. She will consider this option

## 2014-04-20 DIAGNOSIS — H4011X0 Primary open-angle glaucoma, stage unspecified: Secondary | ICD-10-CM | POA: Diagnosis not present

## 2014-04-23 DIAGNOSIS — T887XXA Unspecified adverse effect of drug or medicament, initial encounter: Secondary | ICD-10-CM | POA: Diagnosis not present

## 2014-04-23 DIAGNOSIS — R300 Dysuria: Secondary | ICD-10-CM | POA: Diagnosis not present

## 2014-04-23 DIAGNOSIS — N390 Urinary tract infection, site not specified: Secondary | ICD-10-CM | POA: Diagnosis not present

## 2014-04-24 DIAGNOSIS — T887XXA Unspecified adverse effect of drug or medicament, initial encounter: Secondary | ICD-10-CM | POA: Diagnosis not present

## 2014-04-24 DIAGNOSIS — N390 Urinary tract infection, site not specified: Secondary | ICD-10-CM | POA: Diagnosis not present

## 2014-05-05 ENCOUNTER — Telehealth: Payer: Self-pay | Admitting: *Deleted

## 2014-05-05 NOTE — Telephone Encounter (Signed)
Notified pt of future appointments on 08/03/2014 with Dr. Marko Plume and the Lab. Pt agreed with appt time and date

## 2014-05-12 DIAGNOSIS — N390 Urinary tract infection, site not specified: Secondary | ICD-10-CM | POA: Diagnosis not present

## 2014-05-16 ENCOUNTER — Encounter: Payer: Self-pay | Admitting: Neurology

## 2014-05-16 ENCOUNTER — Ambulatory Visit (INDEPENDENT_AMBULATORY_CARE_PROVIDER_SITE_OTHER): Payer: Medicare Other | Admitting: Neurology

## 2014-05-16 VITALS — BP 120/65 | HR 80 | Temp 98.2°F | Ht 65.0 in | Wt 131.0 lb

## 2014-05-16 DIAGNOSIS — M47812 Spondylosis without myelopathy or radiculopathy, cervical region: Secondary | ICD-10-CM

## 2014-05-16 DIAGNOSIS — R413 Other amnesia: Secondary | ICD-10-CM

## 2014-05-16 DIAGNOSIS — G609 Hereditary and idiopathic neuropathy, unspecified: Secondary | ICD-10-CM | POA: Diagnosis not present

## 2014-05-16 NOTE — Patient Instructions (Addendum)
Overall you are doing well. Your memory scores are better.   You can try Melatonin at night for sleep: take 3 to 5 mg one to 2 hours before your projected bedtime. Try to avoid taking tylenol every day.   Please remember to try to maintain good sleep hygiene, which means: Keep a regular sleep and wake schedule, try not to exercise or have a meal within 2 hours of your bedtime, try to keep your bedroom conducive for sleep, that is, cool and dark, without light distractors such as an illuminated alarm clock, and refrain from watching TV right before sleep or in the middle of the night and do not keep the TV or radio on during the night. Also, try not to use or play on electronic devices at bedtime, such as your cell phone, tablet PC or laptop. If you like to read at bedtime on an electronic device, try to dim the background light as much as possible. Do not eat in the middle of the night.

## 2014-05-16 NOTE — Progress Notes (Signed)
Subjective:    Patient ID: Barbara Rangel is a 68 y.o. female.  HPI   Interim history:   Barbara Rangel is a very pleasant 68 year old right-handed woman with an underlying medical history of breast cancer in 2005 (s/p R mastectomy and chemotherapy, no radiation), hypertension, degenerative arthritis, glaucoma, chronic UTI, colonic polyps and constipation. She is unaccompanied today. I last saw her on 11/15/2013, at which time she reported improved right upper extremity weakness and unchanged numbness of her feet. She had reported mild memory loss. She had neuropsychological testing in August 2014. This showed normal findings. She was noted to have stable findings of neuropathy.  Today, she reports feeling fairly stable with regards to her memory and her neuropathy. She is on omeprazole for GER. She sometimes takes Maalox in addition to that. She has been coping reasonably well since her son's death. She has problems with sleep maintenance. She takes 2 extra strength Tylenol and Benadryl each night which helps her achieve about 6 hours of sleep.  I saw her on 05/13/13, at which time I suggested neurocognitive testing because of her complaint of memory loss. Her MMSE at that time was 27/30. Her neuropathy was stable and her right upper extremity weakness was improved after PT.  She had cognitive testing on 07/01/2013 and 07/09/2013 respectively and I reviewed the test results on 07/12/2013. Essentially she had a normal neurocognitive test results. I reviewed the findings with her in 1/15 and provided her with a copy of the test results.  She lost her son in October 2014, which was a stressful time for her. She has been raising her now 49 year old grandson for the past 5 years.  I first met her on 12/21/2012 at which time I suggested a brain and C-spine MRI. She was then seen back on 01/20/2013, at which time we went over her test results. Her C-spine MRI showed disc bulging with facet hypertrophy and  mild spinal stenosis and severe biforaminal stenosis at C6-7, disc bulging with slight contact upon anterior spinal cord, facet hypertrophy with severe biforaminal stenosis at C5-6, disc bulging with facet hypertrophy and biforaminal stenosis at C4-5. Her brain MRI with and without contrast on 01/05/2013 showed few punctate foci of nonspecific lesions in the right greater than left subcortical white matter, no enhancing lesions. She has an underlying history of breast cancer diagnosed in 2005 and treated with lumpectomy, then mastectomy and high-dose chemotherapy as part of clinical trial. 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 She has a history of head injury in November 2013. I suggested a neurosurgical consultation for her neck degenerative disc disease for symptomatic treatment of her neck muscle spasms I suggested a trial of baclofen.  She saw Dr. Trenton Gammon for her degenerative neck d/s and went through PT for 12 weeks, which helped. She was having a lot of leg cramps and these improved with drinking coconut water, and taking Mg 250 mg daily, and mustard.  She had C. Doppler study and was told she had mild plaques. She has since then been started on ASA 81 mg and generic Lipitor at 20 mg.  Her Past Medical History Is Significant For: Past Medical History  Diagnosis Date  . PONV (postoperative nausea and vomiting)   . Other and unspecified general anesthetics causing adverse effect in therapeutic use     hard to wake up  . Chronic back pain     right radicular leg pain  . Constipation     r/t pain meds and  takes Dulcolax nightly  . FHx: colonic polyps     hx of and mother had colon cancer  . Chronic urinary tract infection     takes Macrodantin and Cranberry daily  . Glaucoma     uses Xalantan nightly  . Cancer     hx breast cancer-right  . Hypertension     takes Amlodipine daily  . Arthritis     degenerative arthritis    Her Past Surgical History Is Significant  For: Past Surgical History  Procedure Laterality Date  . Tubal ligation  1980  . Eye surgery  2004    right eye-detached retina  . Cataract surgery  2005    right eye  . Right breast biopsy  2005  . Breast lumpectomy      x2  . Left breast biopsy  2009  . Ankle surgery  2011    left ankle with screws and plates  . Colonoscopy    . Retinal detachment surgery  2004  . Mastectomy  2005    right  d/t breast cancer;no sticks to right arm LYMPH NODES REMOVED.  . Lumbar laminectomy/decompression microdiscectomy  10/31/2011    Procedure: LUMBAR LAMINECTOMY/DECOMPRESSION MICRODISCECTOMY;  Surgeon: Alvy Beal;  Location: [facility] OR;  Service: Orthopedics;  Laterality: Right;  L4-5 Decompression with Right Facet Decompression     Her Family History Is Significant For: Family History  Problem Relation Age of Onset  . Anesthesia problems Neg Hx   . Hypotension Neg Hx   . Malignant hyperthermia Neg Hx   . Pseudochol deficiency Neg Hx   . Colon cancer Mother     Her Social History Is Significant For: History   Social History  . Marital Status: Married    Spouse Name: Aneta Mins    Number of Children: 4  . Years of Education: BS   Occupational History  . Retired    Social History Main Topics  . Smoking status: Never Smoker   . Smokeless tobacco: Never Used  . Alcohol Use: No  . Drug Use: No  . Sexual Activity: Yes   Other Topics Concern  . None   Social History Narrative   Pt lives at home with spouse.   Caffeine Use: none    Her Allergies Are:  Allergies  Allergen Reactions  . Avelox [Moxifloxacin Hcl In Nacl] Other (See Comments)    Severe headache   . Tramadol Nausea Only and Other (See Comments)    Nausea and Dizziness, too.  . Trimethoprim Other (See Comments)    Rapid heartbeat.  . Citalopram Hydrobromide     Pt can't remember what the side effect was.  . Cephalexin Other (See Comments)    Causes headache  :   Her Current Medications Are:  Outpatient  Encounter Prescriptions as of 05/16/2014  Medication Sig  . acetaminophen (TYLENOL) 500 MG tablet Take 1,000 mg by mouth at bedtime.  Marland Kitchen alendronate (FOSAMAX) 70 MG tablet Take 1 tablet (70 mg total) by mouth every 7 (seven) days. Hold while in hospital. Thursday.  Take with a full glass of water on an empty stomach.  Marland Kitchen amLODipine (NORVASC) 5 MG tablet Take 1 tablet (5 mg total) by mouth daily. Take in evening or at bedtime  . anastrozole (ARIMIDEX) 1 MG tablet Take 1 tablet by mouth  daily  . aspirin EC 81 MG tablet Take 81 mg by mouth daily.  Marland Kitchen atorvastatin (LIPITOR) 20 MG tablet Take 1 tablet by mouth every morning.  . Biotin  5 MG CAPS Take 5 mg by mouth daily.  . calcium citrate-vitamin D (CITRACAL+D) 315-200 MG-UNIT per tablet Take 1 tablet by mouth 2 (two) times daily.   . Cranberry Extract 250 MG TABS Take 1 capsule by mouth daily.  Marland Kitchen DIPHENHYDRAMINE HCL PO Take 25 mg by mouth at bedtime as needed.   . docusate sodium (COLACE) 100 MG capsule Take 100 mg by mouth 2 (two) times daily.    Marland Kitchen latanoprost (XALATAN) 0.005 % ophthalmic solution Place 1 drop into both eyes at bedtime.    Marland Kitchen loratadine (CLARITIN REDITABS) 10 MG dissolvable tablet Take 10 mg by mouth as needed. For allergies  . Magnesium 250 MG TABS Take 250 mg by mouth daily.  . Multiple Vitamins-Minerals (MULTIVITAMINS THER. W/MINERALS) TABS Take 1 tablet by mouth daily.    . nitrofurantoin (MACRODANTIN) 100 MG capsule Take 100 mg by mouth daily.    Marland Kitchen omeprazole (PRILOSEC) 40 MG capsule Take 40 mg by mouth daily.  :  Review of Systems:  Out of a complete 14 point review of systems, all are reviewed and negative with the exception of these symptoms as listed below:  Review of Systems  Constitutional: Negative.   HENT: Negative.   Eyes: Negative.   Respiratory: Negative.   Cardiovascular: Positive for chest pain.  Gastrointestinal: Negative.   Endocrine: Negative.   Genitourinary: Positive for frequency.  Musculoskeletal:  Positive for neck pain and neck stiffness.       Muscle cramps  Skin: Negative.   Allergic/Immunologic: Negative.   Neurological: Positive for numbness.       Memory loss  Hematological: Negative.   Psychiatric/Behavioral: Negative.     Objective:  Neurologic Exam  Physical Exam Physical Examination:   Filed Vitals:   05/16/14 1429  BP: 120/65  Pulse: 80  Temp: 98.2 F (36.8 C)    General Examination: The patient is a very pleasant 68 y.o. female in no acute distress. She appears well-developed and well-nourished and well groomed. She is in good spirits today.  On 05/13/13: 27/30, CDT 4/4, AFT 8.  On 05/16/2014: MMSE 29/30, AFT was 17 per minute, clock drawing was 4 out of 4.   HEENT exam: Normocephalic, atraumatic, pupils are equal, round and reactive to light, extraocular tracking is good without nystagmus. Funduscopic exam is normal, with s/p R cataract repair. Hearing is intact. Speech is clear. Neck is supple with full range of motion and without carotid bruits. She has mild neck muscle tenderness and tightness particularly in the left posterior neck. She has no obvious hypertrophy in her neck muscles. She is somewhat tender to palpation to deep palpation in the left posterior neck muscles.  Face is symmetric with normal facial animation. Airway exam is clear with very mild pharyngeal erythema noted. Tongue protrudes centrally and palate elevates symmetrically. Chest is clear to auscultation, heart sounds are normal and abdominal sounds are normal as well, abdomen is soft and nontender. She has no pitting edema in the distal lower extremities bilaterally. Skin is warm and dry. No trophic changes are seen, but she has has b/l toenail fungus. No joint deformities are noted. Neurologically: Mental status: The patient is awake, alert and oriented in all 4 spheres. Her memory, attention, language and knowledge are appropriate. Cranial nerves II through XII are as described above under  HEENT exam. In addition, shoulder shrug is normal and strength. Motor exam: Normal bulk, and tone is noted. Strength exam is normal today. She has no focal atrophy, no fasciculations  and no pain on deep palpation. Reflexes are 2+ in the upper extremities, 1+ in her knees and absent in her ankles. Cerebellar testing shows no dysmetria or intention tremor and fine motor skills are intact. She has no truncal or gait ataxia. Sensory exam is decreased to pinprick sensation in the feet, unchanged and mildly reduced to vibration sense in the big toes, largely unchanged.           Assessment and Plan:   In summary, Ms. Lukes is a 68 year old right-handed woman with an underlying medical history of breast cancer in 2005 (s/p R mastectomy and chemotherapy, no radiation), hypertension, degenerative arthritis, glaucoma, chronic UTI, colonic polyps and constipation, who presents for follow of her neuropathy and memory loss. Her exam is stable and in fact, her memory scores are better and she had a normal neuropsychological test in 8/14. I reassured her in that regard. Overall, she feels that she is doing better. She has stable neuropathy. She has degenerative cervical spine disease and has done well with physical therapy through her neurosurgeon.  I asked her to stay active mentally and physically. We talked about maintaining a healthy lifestyle in general. I do not think she needs any new testing or new medication at this time. But I did ask her to try to avoid taking Tylenol each day. She does not hurt except for occasional leg cramps. She may want to also take a hiatus from using Benadryl each night. It also helps her with her postnasal drip. She may want to try melatonin. I suggested she try it 3-5 mg 1-2 hours before her projected bedtime. I also advised her regarding good sleep hygiene. At this juncture, I think I can see her back on an as-needed basis and she is doing well. She was in agreement.

## 2014-06-22 ENCOUNTER — Ambulatory Visit (INDEPENDENT_AMBULATORY_CARE_PROVIDER_SITE_OTHER): Payer: Medicare Other | Admitting: Podiatry

## 2014-06-22 ENCOUNTER — Encounter: Payer: Self-pay | Admitting: Podiatry

## 2014-06-22 VITALS — BP 109/64 | HR 80 | Resp 12

## 2014-06-22 DIAGNOSIS — M79609 Pain in unspecified limb: Secondary | ICD-10-CM

## 2014-06-22 DIAGNOSIS — B351 Tinea unguium: Secondary | ICD-10-CM | POA: Diagnosis not present

## 2014-06-22 DIAGNOSIS — M79676 Pain in unspecified toe(s): Secondary | ICD-10-CM

## 2014-06-22 DIAGNOSIS — Z79899 Other long term (current) drug therapy: Secondary | ICD-10-CM | POA: Diagnosis not present

## 2014-06-22 LAB — HEPATIC FUNCTION PANEL
ALT: 23 U/L (ref 0–35)
AST: 24 U/L (ref 0–37)
Albumin: 4.4 g/dL (ref 3.5–5.2)
Alkaline Phosphatase: 60 U/L (ref 39–117)
Bilirubin, Direct: 0.1 mg/dL (ref 0.0–0.3)
Indirect Bilirubin: 0.4 mg/dL (ref 0.2–1.2)
Total Bilirubin: 0.5 mg/dL (ref 0.2–1.2)
Total Protein: 7.5 g/dL (ref 6.0–8.3)

## 2014-06-22 MED ORDER — TERBINAFINE HCL 250 MG PO TABS: 250.0000 mg | ORAL_TABLET | Freq: Every day | ORAL | Status: DC

## 2014-06-23 ENCOUNTER — Telehealth: Payer: Self-pay | Admitting: *Deleted

## 2014-06-23 NOTE — Telephone Encounter (Signed)
Message copied by Lolita Rieger on Thu Jun 23, 2014  3:41 PM ------      Message from: Gean Birchwood      Created: Thu Jun 23, 2014 10:31 AM       Hepatic function panel dated 06/22/2014 within normal limits                  Contact patient have her begin terbinafine 250 mg daily x90 days ------

## 2014-06-23 NOTE — Progress Notes (Signed)
Patient ID: SHAWNY BORKOWSKI, female   DOB: 1946-06-29, 68 y.o.   MRN: [id number]  Subjective: This patient presents again requesting debrided painful toenails. Again patient is questioning oral medication for treatment of mycotic toenails. Within multiple discussions about these in the past, however, patient today would like to use oral medication  Objective: Toenails 6-10 arediscolored, brittle, hypertrophic, incurvated  Assessment: Symptomatic onychomycoses 6-10  Plan: Toenails x10 are debrided without any bleeding Issued request for hermetic function panel Call patient and if within normal limits began Terbinafine 250 mg #30 one daily x2 refills  Hepatic function panel dated 06/22/2014 was within normal limits. Contact patient and advised her to begin terbinafine 250 mg daily  Reappoint x30 days

## 2014-06-23 NOTE — Telephone Encounter (Signed)
Per Dr. Amalia Hailey, I called and informed the patient that her labwork was good, okay to start your Lamisil.  She stated okay thank you.

## 2014-07-20 ENCOUNTER — Encounter: Payer: Self-pay | Admitting: Podiatry

## 2014-07-20 ENCOUNTER — Ambulatory Visit (INDEPENDENT_AMBULATORY_CARE_PROVIDER_SITE_OTHER): Payer: Medicare Other | Admitting: Podiatry

## 2014-07-20 VITALS — BP 118/72 | HR 90 | Resp 12

## 2014-07-20 DIAGNOSIS — Z79899 Other long term (current) drug therapy: Secondary | ICD-10-CM | POA: Diagnosis not present

## 2014-07-20 NOTE — Patient Instructions (Signed)
Repeat lab after 15 doses into bottle #2 Our office will contact you with the results A total of 90 doses are required

## 2014-07-20 NOTE — Progress Notes (Signed)
   Subjective:    Patient ID: Barbara Rangel, female    DOB: 11/26/1945, 68 y.o.   MRN: [id number] This patient presents today for followup visit to assess tolerance of terbinafine 250 mg prescribed on the visit of 06/22/2014. Patient denies any headaches, rashes or taste disturbance. She mentions some occasional right chest pain, however, denies any chest pain at this time HPI    Review of Systems  Eyes: Positive for visual disturbance.  Hematological: Bruises/bleeds easily.  All other systems reviewed and are negative.      Objective:   Physical Exam        Assessment & Plan:   Assessment: Patient advised to contact her internist to evaluate right chest pain which this does not appear to be related to terbinafine.  Continued terbinafine 250 mg #30 with 2 refills Issued request for intermediate hepatic function  After 45 doses of terbinafine Notify patient on receipt of hepatic function and less there is a contraindication will complete all 90 doses

## 2014-07-26 DIAGNOSIS — Z86010 Personal history of colonic polyps: Secondary | ICD-10-CM | POA: Diagnosis not present

## 2014-07-26 DIAGNOSIS — Z23 Encounter for immunization: Secondary | ICD-10-CM | POA: Diagnosis not present

## 2014-07-26 DIAGNOSIS — I10 Essential (primary) hypertension: Secondary | ICD-10-CM | POA: Diagnosis not present

## 2014-07-27 DIAGNOSIS — H4011X0 Primary open-angle glaucoma, stage unspecified: Secondary | ICD-10-CM | POA: Diagnosis not present

## 2014-07-27 DIAGNOSIS — H2510 Age-related nuclear cataract, unspecified eye: Secondary | ICD-10-CM | POA: Diagnosis not present

## 2014-07-31 ENCOUNTER — Other Ambulatory Visit: Payer: Self-pay | Admitting: Oncology

## 2014-08-03 ENCOUNTER — Other Ambulatory Visit (HOSPITAL_BASED_OUTPATIENT_CLINIC_OR_DEPARTMENT_OTHER): Payer: Medicare Other

## 2014-08-03 ENCOUNTER — Encounter: Payer: Self-pay | Admitting: Oncology

## 2014-08-03 ENCOUNTER — Telehealth: Payer: Self-pay | Admitting: Oncology

## 2014-08-03 ENCOUNTER — Ambulatory Visit (HOSPITAL_BASED_OUTPATIENT_CLINIC_OR_DEPARTMENT_OTHER): Payer: Medicare Other | Admitting: Oncology

## 2014-08-03 VITALS — BP 134/78 | HR 89 | Temp 98.2°F | Resp 18 | Ht 65.0 in | Wt 127.1 lb

## 2014-08-03 DIAGNOSIS — M8580 Other specified disorders of bone density and structure, unspecified site: Secondary | ICD-10-CM

## 2014-08-03 DIAGNOSIS — C50919 Malignant neoplasm of unspecified site of unspecified female breast: Secondary | ICD-10-CM | POA: Diagnosis not present

## 2014-08-03 DIAGNOSIS — M949 Disorder of cartilage, unspecified: Secondary | ICD-10-CM | POA: Diagnosis not present

## 2014-08-03 DIAGNOSIS — Z1231 Encounter for screening mammogram for malignant neoplasm of breast: Secondary | ICD-10-CM

## 2014-08-03 DIAGNOSIS — M899 Disorder of bone, unspecified: Secondary | ICD-10-CM

## 2014-08-03 DIAGNOSIS — C50911 Malignant neoplasm of unspecified site of right female breast: Secondary | ICD-10-CM

## 2014-08-03 LAB — CBC WITH DIFFERENTIAL/PLATELET
BASO%: 0.9 % (ref 0.0–2.0)
Basophils Absolute: 0 10*3/uL (ref 0.0–0.1)
EOS%: 1 % (ref 0.0–7.0)
Eosinophils Absolute: 0 10*3/uL (ref 0.0–0.5)
HCT: 43.9 % (ref 34.8–46.6)
HGB: 14.4 g/dL (ref 11.6–15.9)
LYMPH%: 30 % (ref 14.0–49.7)
MCH: 32.1 pg (ref 25.1–34.0)
MCHC: 32.8 g/dL (ref 31.5–36.0)
MCV: 97.8 fL (ref 79.5–101.0)
MONO#: 0.3 10*3/uL (ref 0.1–0.9)
MONO%: 8.9 % (ref 0.0–14.0)
NEUT#: 2.2 10*3/uL (ref 1.5–6.5)
NEUT%: 59.2 % (ref 38.4–76.8)
Platelets: 195 10*3/uL (ref 145–400)
RBC: 4.49 10*6/uL (ref 3.70–5.45)
RDW: 13.8 % (ref 11.2–14.5)
WBC: 3.8 10*3/uL — ABNORMAL LOW (ref 3.9–10.3)
lymph#: 1.1 10*3/uL (ref 0.9–3.3)

## 2014-08-03 LAB — COMPREHENSIVE METABOLIC PANEL (CC13)
ALT: 22 U/L (ref 0–55)
AST: 28 U/L (ref 5–34)
Albumin: 3.9 g/dL (ref 3.5–5.0)
Alkaline Phosphatase: 63 U/L (ref 40–150)
Anion Gap: 6 mEq/L (ref 3–11)
BUN: 14.7 mg/dL (ref 7.0–26.0)
CO2: 29 mEq/L (ref 22–29)
Calcium: 10.2 mg/dL (ref 8.4–10.4)
Chloride: 107 mEq/L (ref 98–109)
Creatinine: 0.9 mg/dL (ref 0.6–1.1)
Glucose: 85 mg/dl (ref 70–140)
Potassium: 4.4 mEq/L (ref 3.5–5.1)
Sodium: 142 mEq/L (ref 136–145)
Total Bilirubin: 0.54 mg/dL (ref 0.20–1.20)
Total Protein: 7.2 g/dL (ref 6.4–8.3)

## 2014-08-03 NOTE — Progress Notes (Signed)
OFFICE PROGRESS NOTE   08/03/2014   Physicians:James Maxwell Caul (PCP), Star Age (neurology), Deri Fuelling, Diedre Bland (gyn, [facility], [place]), [facility]  INTERVAL HISTORY:   Patient is seen, alone for visit, in 6 month follow up of multiple node positive right breast cancer, for which she continues extended treatment on adjuvant Arimidex. Last left mammogram was at [facility] 12-31-13 and last DEXA at [facility] 01-28-14.  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 She does water exercise several times weekly, which does seem to help feet also. Last physical exam by PCP was March 2015; patient is not sure if she had thyroid function checked then and we will contact [facility] to confimr.  She does not have central catheter. She has already had flu vaccine    ONCOLOGIC HISTORY Patient found right breast cancer on breast self exam after negative mammogram same year, diagnosis June 2005 in [place], [place] ER/PR + and Her 2 negative, with patient age 68 then. She was treated with mastectomy with "9 or 11" node axillary evaluation, of which patient recalls "7 or 9" nodes were involved; she did not have local or axillary radiation. She was treated on NSABP B28 trial with dose dense adriamycin cytoxan followed by taxol, from 11-19-2003 thru 09-21-2004. She has been on adjuvant arimidex since Jan 2006. Last bone density scan was at [facility] 12-29-2012 had normal bone density in both LS and hip, improved compared with 2010 and 2013. Most recent left mammogram was at [facility] 12-29-12, with heterogeneously dense breast tissue but otherwise no mammographic findings of concern. Last breast MRI in this EMR was Feb 2011. She had fasting lipids done by Dr Maxwell Caul 01-2013 WNL.  Review of systems as above, also: No recent infectious  illness. No noted changes in mastectomy scar or remaining breast. Per patient, weight is in usual range, tho she was 135 in 01-2014 and 141 in 09-2013 by this EM; patient tells me that weight dropped to 119 during chemotherapy and highest was 142.Marland Kitchen Appetite is good, tho diet is so good that she could easily be low on calories especially exercising 5 days weekly (24 hour review: oatmeal + banana, fruit, salad or sandwich, granola, chicken/veg, water, milk, juice). Bowels and bladder ok. No respiratory symptoms. No new or different pain. No bleeding. Energy good Remainder of 10 point Review of Systems negative.  Objective:  Vital signs in last 24 hours:  BP 134/78  Pulse 89  Temp(Src) 98.2 F (36.8 C) (Oral)  Resp 18  Ht 5\' 5"  (1.651 m)  Wt 127 lb 1.6 oz (57.652 kg)  BMI 21.15 kg/m2  Weight is down 8 lbs from 01-2014 by scales, and visually.  Alert, oriented and appropriate. Ambulatory without difficulty.   HEENT:PERRL, sclerae not icteric. Oral mucosa moist without lesions, posterior pharynx clear.  Neck supple. No JVD.  Lymphatics:no cervical,supraclavicular, axillary or inguinal adenopathy Resp: clear to auscultation bilaterally and normal percussion bilaterally Cardio: regular rate and rhythm. No gallop. HC:WCBJSEG soft, nontender, not distended, no mass or organomegaly. Normally active bowel sounds.  Musculoskeletal/ Extremities: without pitting edema, cords, tenderness Neuro: peripheral neuropathy feet bilaterally. Otherwise nonfocal. PSYCH normal mood and affect Skin without rash, ecchymosis, petechiae Breasts:Right mastectomy scar without evidence of local recurrence, left breast without dominant mass, skin or nipple findings. Axillae benign.   Lab Results:  Results for orders  placed in visit on 08/03/14  CBC WITH DIFFERENTIAL      Result Value Ref Range   WBC 3.8 (*) 3.9 - 10.3 10e3/uL   NEUT# 2.2  1.5 - 6.5 10e3/uL   HGB 14.4  11.6 - 15.9 g/dL   HCT 43.9  34.8 - 46.6 %    Platelets 195  145 - 400 10e3/uL   MCV 97.8  79.5 - 101.0 fL   MCH 32.1  25.1 - 34.0 pg   MCHC 32.8  31.5 - 36.0 g/dL   RBC 4.49  3.70 - 5.45 10e6/uL   RDW 13.8  11.2 - 14.5 %   lymph# 1.1  0.9 - 3.3 10e3/uL   MONO# 0.3  0.1 - 0.9 10e3/uL   Eosinophils Absolute 0.0  0.0 - 0.5 10e3/uL   Basophils Absolute 0.0  0.0 - 0.1 10e3/uL   NEUT% 59.2  38.4 - 76.8 %   LYMPH% 30.0  14.0 - 49.7 %   MONO% 8.9  0.0 - 14.0 %   EOS% 1.0  0.0 - 7.0 %   BASO% 0.9  0.0 - 2.0 %  COMPREHENSIVE METABOLIC PANEL (XM46)      Result Value Ref Range   Sodium 142  136 - 145 mEq/L   Potassium 4.4  3.5 - 5.1 mEq/L   Chloride 107  98 - 109 mEq/L   CO2 29  22 - 29 mEq/L   Glucose 85  70 - 140 mg/dl   BUN 14.7  7.0 - 26.0 mg/dL   Creatinine 0.9  0.6 - 1.1 mg/dL   Total Bilirubin 0.54  0.20 - 1.20 mg/dL   Alkaline Phosphatase 63  40 - 150 U/L   AST 28  5 - 34 U/L   ALT 22  0 - 55 U/L   Total Protein 7.2  6.4 - 8.3 g/dL   Albumin 3.9  3.5 - 5.0 g/dL   Calcium 10.2  8.4 - 10.4 mg/dL   Anion Gap 6  3 - 11 mEq/L    Will confirm with PCP that thyroid has been checked, due to weight changes. Studies/Results:  No results found.  Medications: I have reviewed the patient's current medications.  DISCUSSION: weight and diet as above. We have discussed recent oncology recommendation to extend treatment with aromatase inhibitors in high risk patients, which she has already been doing.   Assessment/Plan:  1.T2N1 right breast cancer: diagnosed June 2005, features and treatment as above. Clinically doing well on continued arimidex. With known low bone density and chronic high risk medication aromatase inhibitor, it is medically necessary to have yearly bone density scan, due 01-2015. Left mammogram due Feb 2016. Return visit 6 mo. Fasting lipids by PCP  2.improvement in pelvic symptoms with pelvic physical therapy  3.hx colon polyps: on 3 year follow up by [facility]  4.hx recurrent UTIs, no symptoms currently  5.mild  hypernatremia and slightly elevated calcium: improved, better hydration  6.less urinary frequency at night, sleep improved.  7.osteopenia, stable since 2014. Continue all interventions to maintain/ improve bone density, which are particularly important given ongoing therapy with high risk aromatase inhibitor. Medically necessary to have bone density scans yearly on the aromatase inhibitor. 8.flu vaccine done 9.weight loss, tho seems to be in her usual range. Follow up thyroid testing, discussed diet.   All questions answered and patient is in agreement with plan. Time spent 25 min including >50% counseling and coordination of care   Barbara Rangel P, MD   08/03/2014, 11:43 AM

## 2014-08-03 NOTE — Telephone Encounter (Signed)
per pof to sch pt appt-[facility] sch not open printed & gave to Webb Silversmith M-sch pt mamma-Per [facility] pt cannot have dexa until 2017-adv pt-sent email to [facility] to adv

## 2014-08-09 ENCOUNTER — Telehealth: Payer: Self-pay | Admitting: *Deleted

## 2014-08-09 ENCOUNTER — Other Ambulatory Visit: Payer: Self-pay

## 2014-08-09 NOTE — Telephone Encounter (Signed)
Message copied by Christa See on Tue Aug 09, 2014  2:38 PM ------      Message from: Gordy Levan      Created: Fri Aug 05, 2014  6:29 PM       1.please call Barbara Rangel at [facility] - ask which MD is seeing her since Dr Maxwell Caul retired, and find out if she has had thyroid function checked in last year. Please send my note 9-23 to correct MD.            [facility] thinks she cannot have bone density 01-2015. Please be sure they are aware that she is "on chronic high risk medication, aromatase inhibitor Arimidex" which should make her eligible for yearly bone density scans. If you cannot get this straightened out, please let me know person and contact # for me to call.            (If you can get it straightened out, try to get code from them so I know how to order these necessary yearly bone density scans on patients on aromatase inhibitors from here .Marland Kitchen..)            Thanks. These are not a rush. ------

## 2014-08-09 NOTE — Telephone Encounter (Signed)
1. Spoke with [facility] - new MD is Dr. Dorian Heckle. Faxed last office visit note from 08/03/14 to Dr. Michail Sermon.    2. Pt had bone density already this year (12/2013). Report is in [place] [place] so ~ 01/2015 would be correct for next scan due.   3. Per the breast center, the code to order future bone density scans when pt is on aromatase inhibitors is V07.52.  Above information sent to Dr. Marko Plume via inbasket.

## 2014-08-18 LAB — HEPATIC FUNCTION PANEL
ALT: 22 U/L (ref 0–35)
AST: 26 U/L (ref 0–37)
Albumin: 4.4 g/dL (ref 3.5–5.2)
Alkaline Phosphatase: 61 U/L (ref 39–117)
Bilirubin, Direct: 0.1 mg/dL (ref 0.0–0.3)
Indirect Bilirubin: 0.4 mg/dL (ref 0.2–1.2)
Total Bilirubin: 0.5 mg/dL (ref 0.2–1.2)
Total Protein: 7.4 g/dL (ref 6.0–8.3)

## 2014-08-23 ENCOUNTER — Telehealth: Payer: Self-pay | Admitting: *Deleted

## 2014-08-23 NOTE — Telephone Encounter (Signed)
I called and left her a message that her labs were normal, continue the medication per Dr. Amalia Hailey.

## 2014-08-23 NOTE — Telephone Encounter (Signed)
Message copied by Lolita Rieger on Tue Aug 23, 2014  8:30 AM ------      Message from: Gean Birchwood      Created: Mon Aug 22, 2014  1:15 PM       Hepatic function collection date 07/20/2014 report date 08/18/2014 within normal limits            Contact patient and have complete all remaining terbinafine 250 mg doses for a total of 90 doses ------

## 2014-08-29 ENCOUNTER — Encounter: Payer: Self-pay | Admitting: Podiatry

## 2014-08-29 ENCOUNTER — Ambulatory Visit (INDEPENDENT_AMBULATORY_CARE_PROVIDER_SITE_OTHER): Payer: Medicare Other | Admitting: Podiatry

## 2014-08-29 ENCOUNTER — Ambulatory Visit (INDEPENDENT_AMBULATORY_CARE_PROVIDER_SITE_OTHER): Payer: Medicare Other

## 2014-08-29 VITALS — BP 103/73 | HR 86 | Resp 14 | Ht 65.0 in | Wt 127.0 lb

## 2014-08-29 DIAGNOSIS — M722 Plantar fascial fibromatosis: Secondary | ICD-10-CM

## 2014-08-29 DIAGNOSIS — M79672 Pain in left foot: Secondary | ICD-10-CM

## 2014-08-29 NOTE — Patient Instructions (Signed)
Bent - Knee Calf Stretch  1) Stand an arm's length away from a wall. Place the palms of your hands on the wall. Step forward about 12 inches with the opposite foot.  2) Keeping toes pointed forward and both heels on the floor, bend both knees and lean forward. Hold this position for 60 seconds. Don't arch your back and don't hunch your shoulders.  3) Repeat this twice.  DO THIS STRETCHING TECHNIQUE 3 TIMES A DAY.   Stretching Exercises before Standing      Pull your toes up toward your nose and hold for 1 minute before standing.  A towel can assist with this exercise if you put the towel under the ball of your foot. This exercise reduces the intense    pain associated when changing from a seated to a standing position. This stretch can usually be the most beneficial if done before getting out of bed in the mornings. Plantar Fasciitis Plantar fasciitis is a common condition that causes foot pain. It is soreness (inflammation) of the band of tough fibrous tissue on the bottom of the foot that runs from the heel bone (calcaneus) to the ball of the foot. The cause of this soreness may be from excessive standing, poor fitting shoes, running on hard surfaces, being overweight, having an abnormal walk, or overuse (this is common in runners) of the painful foot or feet. It is also common in aerobic exercise dancers and ballet dancers. SYMPTOMS  Most people with plantar fasciitis complain of:  Severe pain in the morning on the bottom of their foot especially when taking the first steps out of bed. This pain recedes after a few minutes of walking.  Severe pain is experienced also during walking following a long period of inactivity.  Pain is worse when walking barefoot or up stairs DIAGNOSIS   Your caregiver will diagnose this condition by examining and feeling your foot.  Special tests such as X-rays of your foot, are usually not needed. PREVENTION   Consult a sports medicine professional  before beginning a new exercise program.  Walking programs offer a good workout. With walking there is a lower chance of overuse injuries common to runners. There is less impact and less jarring of the joints.  Begin all new exercise programs slowly. If problems or pain develop, decrease the amount of time or distance until you are at a comfortable level.  Wear good shoes and replace them regularly.  Stretch your foot and the heel cords at the back of the ankle (Achilles tendon) both before and after exercise.  Run or exercise on even surfaces that are not hard. For example, asphalt is better than pavement.  Do not run barefoot on hard surfaces.  If using a treadmill, vary the incline.  Do not continue to workout if you have foot or joint problems. Seek professional help if they do not improve. HOME CARE INSTRUCTIONS   Avoid activities that cause you pain until you recover.  Use ice or cold packs on the problem or painful areas after working out.  Only take over-the-counter or prescription medicines for pain, discomfort, or fever as directed by your caregiver.  Soft shoe inserts or athletic shoes with air or gel sole cushions may be helpful.  If problems continue or become more severe, consult a sports medicine caregiver or your own health care provider. Cortisone is a potent anti-inflammatory medication that may be injected into the painful area. You can discuss this treatment with your caregiver. MAKE   SURE YOU:   Understand these instructions.  Will watch your condition.  Will get help right away if you are not doing well or get worse. Document Released: 07/23/2001 Document Revised: 01/20/2012 Document Reviewed: 09/21/2008 ExitCare Patient Information 2015 [employer]. This information is not intended to replace advice given to you by your health care provider. Make sure you discuss any questions you have with your health care provider.  

## 2014-08-29 NOTE — Progress Notes (Signed)
   Subjective:    Patient ID: Barbara Rangel, female    DOB: 1946/07/29, 68 y.o.   MRN: [id number]  HPI Comments: N plantar fasciitis L left heel and inferior arch D 2 months O  C pulling, burning and sharp pain A worse after resting, morning and long period of standing T none     Review of Systems  All other systems reviewed and are negative.      Objective:   Physical Exam  Orientated x3  Vascular: DP and PT pulses 2/4 bilaterally  Neurological: Need ankle reflex equal and reactive bilaterally  Dermatological: Surgical scars medial and lateral left ankle  Musculoskeletal: HAV deformities bilaterally Palpable tenderness inferior left heel and proximal one third of medial fascial band left without any palpable lesions.  X-ray examination left foot  Intact bony structure without fracture and/or dislocation noted  Retained internal fixation distal tibia and fibula with good alignment  Posterior and inferior calcaneal spurs  Radiographic impression:  No acute bony abnormality noted  Inferior calcaneal spur     Assessment & Plan:   Assessment: Plantar fasciitis left Satisfactory neurovascular status paragraph   Plan: Offered patient Kenalog injection inferior heel left and she verbally consents. Skin is prepped with alcohol and Betadine and 10 mg of Kenalog mixed with 10 mg of plain Xylocaine and 2.5 mg of plain Marcaine were injected inferior heel left for Kenalog injection #1. Patient tolerated injection without any difficulty  Plantar fasciitis strap dispensed with wearing instruction provided  . Please note patient return after discharge and was complaining of fasciitis strap rubbing over internal fixation on ankle and causing the internal fixation in the ankle to "pop out". Our nurse taught patient how to cover  ankle and rotated the ankle and straps to accommodate internal fixation.

## 2014-08-30 ENCOUNTER — Encounter: Payer: Self-pay | Admitting: Podiatry

## 2014-08-30 DIAGNOSIS — M722 Plantar fascial fibromatosis: Secondary | ICD-10-CM

## 2014-08-30 DIAGNOSIS — M79672 Pain in left foot: Secondary | ICD-10-CM | POA: Diagnosis not present

## 2014-08-30 MED ORDER — TRIAMCINOLONE ACETONIDE 10 MG/ML IJ SUSP
10.0000 mg | Freq: Once | INTRAMUSCULAR | Status: AC
  Administered 2014-08-30: 10 mg

## 2014-09-02 ENCOUNTER — Other Ambulatory Visit: Payer: Self-pay | Admitting: Oncology

## 2014-09-02 DIAGNOSIS — C50911 Malignant neoplasm of unspecified site of right female breast: Secondary | ICD-10-CM

## 2014-09-05 ENCOUNTER — Telehealth: Payer: Self-pay | Admitting: *Deleted

## 2014-09-05 NOTE — Telephone Encounter (Signed)
Arimidex 1mg  , # 7 tablets phoned into pharmacy. Pt will be receiving 90 day supply from [facility] later this week.

## 2014-09-09 ENCOUNTER — Encounter: Payer: Self-pay | Admitting: Oncology

## 2014-09-09 NOTE — Progress Notes (Signed)
Medical Oncology  Outside Labs  Labs received from [facility]. Last TSH 0.72 on 08-30-2009 Lipids 01-2014: choles 132, Trig 49, HDL 59, calc LDL 63, Chol/HDL 2.2 Chemistries 01-2014 CBC diff 01-2013  Reports sent to be scanned into this EMR  L.Marko Plume

## 2014-09-14 ENCOUNTER — Other Ambulatory Visit: Payer: Self-pay | Admitting: Gastroenterology

## 2014-09-21 DIAGNOSIS — N952 Postmenopausal atrophic vaginitis: Secondary | ICD-10-CM | POA: Diagnosis not present

## 2014-09-21 DIAGNOSIS — N390 Urinary tract infection, site not specified: Secondary | ICD-10-CM | POA: Diagnosis not present

## 2014-09-22 ENCOUNTER — Encounter (HOSPITAL_COMMUNITY): Payer: Self-pay | Admitting: *Deleted

## 2014-09-29 ENCOUNTER — Telehealth: Payer: Self-pay | Admitting: Oncology

## 2014-10-03 ENCOUNTER — Encounter: Payer: Self-pay | Admitting: Podiatry

## 2014-10-03 ENCOUNTER — Ambulatory Visit (INDEPENDENT_AMBULATORY_CARE_PROVIDER_SITE_OTHER): Payer: Medicare Other | Admitting: Podiatry

## 2014-10-03 VITALS — BP 128/85 | HR 79 | Resp 11

## 2014-10-03 DIAGNOSIS — M722 Plantar fascial fibromatosis: Secondary | ICD-10-CM

## 2014-10-03 NOTE — Progress Notes (Signed)
Patient ID: Barbara Rangel, female   DOB: [birth year], 68 y.o.   MRN: [id number]  Subjective: This patient presents for follow-up care for left plantar fasciitis after one Kenalog Injection on the visit of 08/29/2014 and dispensing of fasciitis strap wear in the left foot. Patient states that the injection initially cause some increased pain, however, the pain has decreased slightly since the injection. She attempted to wear the fasciitis strap, however, the ankle portion pressed on a screw had from internal fixation and she was not able to tolerate the fasciitis strap on the left. She also states that when she does stretching it irritates her left heel  Review of systems unchanged and are negative  Objective: Orientated 3  Vascular: DP and PT pulse 2/4 bilaterally  Neurological: Deferred  Dermatological: Surgical scars on the medial left ankle with palpable screw head on medial malleolus  Musculoskeletal: HAV deformities bilaterally Atrophic fat-pad plantar left first MPJ with palpable tenderness in the proximal medial insertional area of left plantar fascia without any palpable lesions  Assessment: Plantar fasciitis left Atrophic fat-pad left Intolerance of fasciitis strap because of irritation of retained internal fixation in the left ankle Incorrect method of bent knee stretching  Plan: Attach surgical felt pad into the medial longitudinal arch area of the left insole to be worn athletic style shoe to see if she can notice any improvement with additional support in the medial longitudinal arch Dispensed an additional felt pad for patient to attach and another peer shoes  Demonstrated correct bent knee stretching and patient was able to do this stretch without any difficulty  Patient return as needed if the symptoms are persistent and not responding to Athletic style shoes with additional felt pads in the left medial longitudinal arch Maintaining bent knee stretching  daily  If patient returns and patient was able to tolerate the felt pad in the longitudinal arch, however, the left plantar fascial pain still persisted would consider a soft flexible custom foot orthotic

## 2014-10-03 NOTE — Patient Instructions (Signed)
  If the felt pad in the left arch is helpful attach Exter pad and another peer shoes  Bent - Knee Calf Stretch  1) Stand an arm's length away from a wall. Place the palms of your hands on the wall. Step forward about 12 inches with the opposite foot.  2) Keeping toes pointed forward and both heels on the floor, bend both knees and lean forward. Hold this position for 60 seconds. Don't arch your back and don't hunch your shoulders.  3) Repeat this twice.  DO THIS STRETCHING TECHNIQUE 3 TIMES A DAY.   Stretching Exercises before Standing  Pull your toes up toward your nose and hold for 1 minute before standing.  A towel can assist with this exercise if you put the towel under the ball of your foot. This exercise reduces the intense   pain associated when changing from a seated to a standing position. This stretch can usually be the most beneficial if done before getting out of bed in the mornings.

## 2014-10-10 NOTE — Anesthesia Preprocedure Evaluation (Addendum)
Anesthesia Evaluation  Patient identified by MRN, date of birth, ID band Patient awake    Reviewed: Allergy & Precautions, H&P , NPO status , Patient's Chart, lab work & pertinent test results  History of Anesthesia Complications (+) PONV and history of anesthetic complications  Airway Mallampati: II  TM Distance: >3 FB Neck ROM: Full    Dental no notable dental hx. (+) Dental Advisory Given   Pulmonary neg pulmonary ROS,  breath sounds clear to auscultation  Pulmonary exam normal       Cardiovascular hypertension, Pt. on medications Rhythm:Regular Rate:Normal     Neuro/Psych negative neurological ROS  negative psych ROS   GI/Hepatic negative GI ROS, Neg liver ROS,   Endo/Other  negative endocrine ROS  Renal/GU negative Renal ROS  negative genitourinary   Musculoskeletal  (+) Arthritis -, Osteoarthritis,    Abdominal   Peds negative pediatric ROS (+)  Hematology negative hematology ROS (+)   Anesthesia Other Findings Hx of R breast cancer  Reproductive/Obstetrics negative OB ROS                            Anesthesia Physical Anesthesia Plan  ASA: II  Anesthesia Plan: MAC   Post-op Pain Management:    Induction: Intravenous  Airway Management Planned: Nasal Cannula  Additional Equipment:   Intra-op Plan:   Post-operative Plan: Extubation in OR  Informed Consent: I have reviewed the patients History and Physical, chart, labs and discussed the procedure including the risks, benefits and alternatives for the proposed anesthesia with the patient or authorized representative who has indicated his/her understanding and acceptance.   Dental advisory given  Plan Discussed with: CRNA  Anesthesia Plan Comments:         Anesthesia Quick Evaluation

## 2014-10-11 ENCOUNTER — Ambulatory Visit (HOSPITAL_COMMUNITY): Payer: Medicare Other | Admitting: Anesthesiology

## 2014-10-11 ENCOUNTER — Ambulatory Visit (HOSPITAL_COMMUNITY)
Admission: RE | Admit: 2014-10-11 | Discharge: 2014-10-11 | Disposition: A | Discharge status: Home / Self Care | Payer: Medicare Other | Source: Ambulatory Visit | Attending: Gastroenterology | Admitting: Gastroenterology

## 2014-10-11 ENCOUNTER — Encounter (HOSPITAL_COMMUNITY): Payer: Self-pay | Admitting: Gastroenterology

## 2014-10-11 ENCOUNTER — Encounter (HOSPITAL_COMMUNITY)
Admission: RE | Disposition: A | Discharge status: Home / Self Care | Payer: Self-pay | Source: Ambulatory Visit | Attending: Gastroenterology

## 2014-10-11 DIAGNOSIS — Z853 Personal history of malignant neoplasm of breast: Secondary | ICD-10-CM | POA: Diagnosis not present

## 2014-10-11 DIAGNOSIS — K635 Polyp of colon: Secondary | ICD-10-CM | POA: Diagnosis not present

## 2014-10-11 DIAGNOSIS — Z86010 Personal history of colonic polyps: Secondary | ICD-10-CM | POA: Insufficient documentation

## 2014-10-11 DIAGNOSIS — Z1211 Encounter for screening for malignant neoplasm of colon: Secondary | ICD-10-CM | POA: Diagnosis not present

## 2014-10-11 DIAGNOSIS — I10 Essential (primary) hypertension: Secondary | ICD-10-CM | POA: Insufficient documentation

## 2014-10-11 DIAGNOSIS — D124 Benign neoplasm of descending colon: Secondary | ICD-10-CM | POA: Diagnosis not present

## 2014-10-11 DIAGNOSIS — Z880 Allergy status to penicillin: Secondary | ICD-10-CM | POA: Insufficient documentation

## 2014-10-11 DIAGNOSIS — I2510 Atherosclerotic heart disease of native coronary artery without angina pectoris: Secondary | ICD-10-CM | POA: Diagnosis not present

## 2014-10-11 DIAGNOSIS — Z888 Allergy status to other drugs, medicaments and biological substances status: Secondary | ICD-10-CM | POA: Diagnosis not present

## 2014-10-11 HISTORY — PX: COLONOSCOPY WITH PROPOFOL: SHX5780

## 2014-10-11 SURGERY — COLONOSCOPY WITH PROPOFOL
Anesthesia: Monitor Anesthesia Care

## 2014-10-11 MED ORDER — PROPOFOL 10 MG/ML IV BOLUS
INTRAVENOUS | Status: AC
  Filled 2014-10-11: qty 20

## 2014-10-11 MED ORDER — FENTANYL CITRATE 0.05 MG/ML IJ SOLN
INTRAMUSCULAR | Status: DC | PRN
Start: 2014-10-11 — End: 2014-10-11
  Administered 2014-10-11 (×2): 50 ug via INTRAVENOUS

## 2014-10-11 MED ORDER — SODIUM CHLORIDE 0.9 % IV SOLN: INTRAVENOUS | Status: DC

## 2014-10-11 MED ORDER — LACTATED RINGERS IV SOLN
INTRAVENOUS | Status: DC
  Administered 2014-10-11: 1000 mL via INTRAVENOUS

## 2014-10-11 MED ORDER — FENTANYL CITRATE 0.05 MG/ML IJ SOLN
INTRAMUSCULAR | Status: AC
  Filled 2014-10-11: qty 2

## 2014-10-11 MED ORDER — LIDOCAINE HCL (CARDIAC) 20 MG/ML IV SOLN
INTRAVENOUS | Status: DC | PRN
  Administered 2014-10-11: 40 mg via INTRAVENOUS

## 2014-10-11 MED ORDER — PROPOFOL INFUSION 10 MG/ML OPTIME
INTRAVENOUS | Status: DC | PRN
  Administered 2014-10-11: 140 ug/kg/min via INTRAVENOUS

## 2014-10-11 SURGICAL SUPPLY — 21 items

## 2014-10-11 NOTE — H&P (Signed)
  Procedure: Surveillance colonoscopy. Colonoscopies with removal of small adenomatous colon polyps performed on 07/03/2007 and 08/24/2009  History: The patient is a 68 year old female born [birth year]. She is scheduled to undergo a surveillance colonoscopy with polypectomy to prevent colon cancer.  Past medical history: Breast cancer surgery. Hypertension. Adenomatous colon polyps. Shingles. Vasovagal syncope. Cervical spinal stenosis. Carotid artery disease. Right cataract surgery. Detached retina surgery. Tubal ligation. Ankle fracture surgery.  Medication allergies: Trimethoprim. Avelox. Celexa. Keflex. Tramadol.  Exam: The patient is alert and lying comfortably on the endoscopy stretcher. Abdomen is soft and nontender to palpation. Lungs are clear to auscultation. Cardiac exam reveals a regular rhythm.  Plan: Proceed with surveillance colonoscopy

## 2014-10-11 NOTE — Anesthesia Postprocedure Evaluation (Signed)
  Anesthesia Post-op Note  Patient: Barbara Rangel  Procedure(s) Performed: Procedure(s) (LRB): COLONOSCOPY WITH PROPOFOL (N/A)  Patient Location: PACU  Anesthesia Type: MAC  Level of Consciousness: awake and alert   Airway and Oxygen Therapy: Patient Spontanous Breathing  Post-op Pain: mild  Post-op Assessment: Post-op Vital signs reviewed, Patient's Cardiovascular Status Stable, Respiratory Function Stable, Patent Airway and No signs of Nausea or vomiting  Last Vitals:  Filed Vitals:   10/11/14 1307  BP: 122/45  Pulse: 79  Temp:   Resp: 31    Post-op Vital Signs: stable   Complications: No apparent anesthesia complications

## 2014-10-11 NOTE — Transfer of Care (Signed)
Immediate Anesthesia Transfer of Care Note  Patient: Barbara Rangel  Procedure(s) Performed: Procedure(s): COLONOSCOPY WITH PROPOFOL (N/A)  Patient Location: Endoscopy Unit  Anesthesia Type:MAC  Level of Consciousness: awake  Airway & Oxygen Therapy: Patient Spontanous Breathing  Post-op Assessment: Report given to PACU RN and Post -op Vital signs reviewed and stable  Post vital signs: Reviewed and stable  Complications: No apparent anesthesia complications

## 2014-10-11 NOTE — Op Note (Signed)
Procedure: Surveillance colonoscopy. Personal history of adenomatous colon polyps removed colonoscopically.  Endoscopist: Earle Gell  Premedication: Propofol administered by anesthesia  Procedure: The patient was placed in the left lateral decubitus position. Anal inspection and digital rectal exam were normal. The Pentax pediatric colonoscope was introduced into the rectum and advanced to the cecum. A normal-appearing appendiceal orifice and ileocecal valve were identified. Colonic preparation for the exam today was good. Withdrawal time was 14 minutes.  Rectum. Normal. Retroflexed view of the distal rectum normal  Sigmoid colon and descending colon. Normal  Splenic flexure. Normal  Transverse colon. Normal  Hepatic flexure. Normal  Ascending colon. Normal  Cecum and ileocecal valve. From the proximal cecum a 1 cm pedunculated polyp was removed with the electrocautery snare  Assessment: From the cecum a 1 cm pedunculated polyp was removed with the electrocautery snare; otherwise normal colonoscopy  Recommendation: Schedule repeat surveillance colonoscopy in 5 years

## 2014-10-12 ENCOUNTER — Encounter (HOSPITAL_COMMUNITY): Payer: Self-pay | Admitting: Gastroenterology

## 2014-11-07 DIAGNOSIS — H4011X1 Primary open-angle glaucoma, mild stage: Secondary | ICD-10-CM | POA: Diagnosis not present

## 2014-12-07 ENCOUNTER — Other Ambulatory Visit: Payer: Self-pay | Admitting: Oncology

## 2014-12-07 ENCOUNTER — Telehealth: Payer: Self-pay

## 2014-12-07 DIAGNOSIS — C50919 Malignant neoplasm of unspecified site of unspecified female breast: Secondary | ICD-10-CM

## 2014-12-07 NOTE — Telephone Encounter (Signed)
Barbara Rangel was wondering if Dr. Marko Plume wanted her to have a bone density scan this year since she is on an aromatase inhibitor.  Told her that Dr. Marko Plume did put order in computer to have it scheduled ~March.  Ms. Stitt is scheduled for her mammogram on 01-03-15 at the [facility].   Spoke with the [place] and the bondensity was scheduled for 1030 am just before her mammogram.  Told patient she needed to arrive at 1015.   Told her that with her Medicare insurance that she will have to sign a waiver at the [facility] stating that she will cover the charges if not covered by ins.  since the scan was done last year.  Discussed with Ms. Sabbagh that the scan was coded for High risk medication for cancer therapy.  ICD 10 code is m89.9 The procedure code for the bone density is 77080. Suggested that she call her insurance to see if this is covered or if it needs prior authorization or letter of medical necessity so she is not surprised with a bill.  Ms. Kiesler appreciated the help with this matter.

## 2014-12-21 ENCOUNTER — Encounter: Payer: Self-pay | Admitting: Podiatry

## 2014-12-21 ENCOUNTER — Ambulatory Visit (INDEPENDENT_AMBULATORY_CARE_PROVIDER_SITE_OTHER): Payer: Medicare Other | Admitting: Podiatry

## 2014-12-21 DIAGNOSIS — M722 Plantar fascial fibromatosis: Secondary | ICD-10-CM

## 2014-12-21 NOTE — Patient Instructions (Signed)
Okay to do exercises several times a day for 30-60 seconds per exercise  Plantar Fasciitis (Heel Spur Syndrome) with Rehab The plantar fascia is a fibrous, ligament-like, soft-tissue structure that spans the bottom of the foot. Plantar fasciitis is a condition that causes pain in the foot due to inflammation of the tissue. SYMPTOMS   Pain and tenderness on the underneath side of the foot.  Pain that worsens with standing or walking. CAUSES  Plantar fasciitis is caused by irritation and injury to the plantar fascia on the underneath side of the foot. Common mechanisms of injury include:  Direct trauma to bottom of the foot.  Damage to a small nerve that runs under the foot where the main fascia attaches to the heel bone.  Stress placed on the plantar fascia due to bone spurs. RISK INCREASES WITH:   Activities that place stress on the plantar fascia (running, jumping, pivoting, or cutting).  Poor strength and flexibility.  Improperly fitted shoes.  Tight calf muscles.  Flat feet.  Failure to warm-up properly before activity.  Obesity. PREVENTION  Warm up and stretch properly before activity.  Allow for adequate recovery between workouts.  Maintain physical fitness:  Strength, flexibility, and endurance.  Cardiovascular fitness.  Maintain a health body weight.  Avoid stress on the plantar fascia.  Wear properly fitted shoes, including arch supports for individuals who have flat feet. PROGNOSIS  If treated properly, then the symptoms of plantar fasciitis usually resolve without surgery. However, occasionally surgery is necessary. RELATED COMPLICATIONS   Recurrent symptoms that may result in a chronic condition.  Problems of the lower back that are caused by compensating for the injury, such as limping.  Pain or weakness of the foot during push-off following surgery.  Chronic inflammation, scarring, and partial or complete fascia tear, occurring more often from  repeated injections. TREATMENT  Treatment initially involves the use of ice and medication to help reduce pain and inflammation. The use of strengthening and stretching exercises may help reduce pain with activity, especially stretches of the Achilles tendon. These exercises may be performed at home or with a therapist. Your caregiver may recommend that you use heel cups of arch supports to help reduce stress on the plantar fascia. Occasionally, corticosteroid injections are given to reduce inflammation. If symptoms persist for greater than 6 months despite non-surgical (conservative), then surgery may be recommended.  MEDICATION   If pain medication is necessary, then nonsteroidal anti-inflammatory medications, such as aspirin and ibuprofen, or other minor pain relievers, such as acetaminophen, are often recommended.  Do not take pain medication within 7 days before surgery.  Prescription pain relievers may be given if deemed necessary by your caregiver. Use only as directed and only as much as you need.  Corticosteroid injections may be given by your caregiver. These injections should be reserved for the most serious cases, because they may only be given a certain number of times. HEAT AND COLD  Cold treatment (icing) relieves pain and reduces inflammation. Cold treatment should be applied for 10 to 15 minutes every 2 to 3 hours for inflammation and pain and immediately after any activity that aggravates your symptoms. Use ice packs or massage the area with a piece of ice (ice massage).  Heat treatment may be used prior to performing the stretching and strengthening activities prescribed by your caregiver, physical therapist, or athletic trainer. Use a heat pack or soak the injury in warm water. SEEK IMMEDIATE MEDICAL CARE IF:  Treatment seems to offer no  benefit, or the condition worsens.  Any medications produce adverse side effects. EXERCISES RANGE OF MOTION (ROM) AND STRETCHING EXERCISES  - Plantar Fasciitis (Heel Spur Syndrome) These exercises may help you when beginning to rehabilitate your injury. Your symptoms may resolve with or without further involvement from your physician, physical therapist or athletic trainer. While completing these exercises, remember:   Restoring tissue flexibility helps normal motion to return to the joints. This allows healthier, less painful movement and activity.  An effective stretch should be held for at least 30 seconds.  A stretch should never be painful. You should only feel a gentle lengthening or release in the stretched tissue. RANGE OF MOTION - Toe Extension, Flexion  Sit with your right / left leg crossed over your opposite knee.  Grasp your toes and gently pull them back toward the top of your foot. You should feel a stretch on the bottom of your toes and/or foot.  Hold this stretch for __________ seconds.  Now, gently pull your toes toward the bottom of your foot. You should feel a stretch on the top of your toes and or foot.  Hold this stretch for __________ seconds. Repeat __________ times. Complete this stretch __________ times per day.  RANGE OF MOTION - Ankle Dorsiflexion, Active Assisted  Remove shoes and sit on a chair that is preferably not on a carpeted surface.  Place right / left foot under knee. Extend your opposite leg for support.  Keeping your heel down, slide your right / left foot back toward the chair until you feel a stretch at your ankle or calf. If you do not feel a stretch, slide your bottom forward to the edge of the chair, while still keeping your heel down.  Hold this stretch for __________ seconds. Repeat __________ times. Complete this stretch __________ times per day.  STRETCH - Gastroc, Standing  Place hands on wall.  Extend right / left leg, keeping the front knee somewhat bent.  Slightly point your toes inward on your back foot.  Keeping your right / left heel on the floor and your knee  straight, shift your weight toward the wall, not allowing your back to arch.  You should feel a gentle stretch in the right / left calf. Hold this position for __________ seconds. Repeat __________ times. Complete this stretch __________ times per day. STRETCH - Soleus, Standing  Place hands on wall.  Extend right / left leg, keeping the other knee somewhat bent.  Slightly point your toes inward on your back foot.  Keep your right / left heel on the floor, bend your back knee, and slightly shift your weight over the back leg so that you feel a gentle stretch deep in your back calf.  Hold this position for __________ seconds. Repeat __________ times. Complete this stretch __________ times per day. STRETCH - Gastrocsoleus, Standing  Note: This exercise can place a lot of stress on your foot and ankle. Please complete this exercise only if specifically instructed by your caregiver.   Place the ball of your right / left foot on a step, keeping your other foot firmly on the same step.  Hold on to the wall or a rail for balance.  Slowly lift your other foot, allowing your body weight to press your heel down over the edge of the step.  You should feel a stretch in your right / left calf.  Hold this position for __________ seconds.  Repeat this exercise with a slight bend in your  right / left knee. Repeat __________ times. Complete this stretch __________ times per day.  STRENGTHENING EXERCISES - Plantar Fasciitis (Heel Spur Syndrome)  These exercises may help you when beginning to rehabilitate your injury. They may resolve your symptoms with or without further involvement from your physician, physical therapist or athletic trainer. While completing these exercises, remember:   Muscles can gain both the endurance and the strength needed for everyday activities through controlled exercises.  Complete these exercises as instructed by your physician, physical therapist or athletic trainer.  Progress the resistance and repetitions only as guided. STRENGTH - Towel Curls  Sit in a chair positioned on a non-carpeted surface.  Place your foot on a towel, keeping your heel on the floor.  Pull the towel toward your heel by only curling your toes. Keep your heel on the floor.  If instructed by your physician, physical therapist or athletic trainer, add ____________________ at the end of the towel. Repeat __________ times. Complete this exercise __________ times per day. STRENGTH - Ankle Inversion  Secure one end of a rubber exercise band/tubing to a fixed object (table, pole). Loop the other end around your foot just before your toes.  Place your fists between your knees. This will focus your strengthening at your ankle.  Slowly, pull your big toe up and in, making sure the band/tubing is positioned to resist the entire motion.  Hold this position for __________ seconds.  Have your muscles resist the band/tubing as it slowly pulls your foot back to the starting position. Repeat __________ times. Complete this exercises __________ times per day.  Document Released: 10/28/2005 Document Revised: 01/20/2012 Document Reviewed: 02/09/2009 [facility] Patient Information 2015 [place], [place]. This information is not intended to replace advice given to you by your health care provider. Make sure you discuss any questions you have with your health care provider.

## 2014-12-21 NOTE — Progress Notes (Signed)
Patient ID: Barbara Rangel, female   DOB: [birth year], 69 y.o.   MRN: [id number]  Subjective: This patient presents again complaining of left plantar fasciitis. The symptoms began approximately August 2015 and her initial visit for this problem was 08/29/2014. She's had one Kenalog Injection on the visit of 08/29/2014 which she said exacerbated the problem. A fasciitis strap was dispensed, however, the internal fixation in her medial ankle cause discomfort and she's not able to tolerate the fasciitis strap. She is wearing athletic style shoes stretching and a longitudinal pad was attached to her insole which she thought provided some reduction of pain.  Objective: Orientated 3 There is palpable tenderness the medial plantar fascial insertional area left without any palpable lesions. This duplicates patient's discomfort.  Assessment: Plantar fasciitis left  Plan: I'm recommending prescription foot orthotics for the indication of plantar fasciitis left. Rx Poly-Pro,flexible, full length with black final top cover Digital impression right and left obtain for custom foot orthotic  Also demonstrated taping over the mid arch utilizing prewrap to protect the skin and 2 inch tape over the pre-wrap. Also fabricated a removable sling to attach in her mid arch prior to using the daily taping.  After visit summary dispensed full protocol for home physical therapy Recommended over-the-counter Tylenol for pain control  Notify patient upon receipt of custom foot orthotic

## 2015-01-03 ENCOUNTER — Ambulatory Visit
Admission: RE | Admit: 2015-01-03 | Discharge: 2015-01-03 | Disposition: A | Discharge status: Home / Self Care | Payer: Medicare Other | Source: Ambulatory Visit | Attending: Oncology | Admitting: Oncology

## 2015-01-03 DIAGNOSIS — Z1231 Encounter for screening mammogram for malignant neoplasm of breast: Secondary | ICD-10-CM | POA: Diagnosis not present

## 2015-01-03 DIAGNOSIS — M8580 Other specified disorders of bone density and structure, unspecified site: Secondary | ICD-10-CM

## 2015-01-03 DIAGNOSIS — Z780 Asymptomatic menopausal state: Secondary | ICD-10-CM | POA: Diagnosis not present

## 2015-01-03 DIAGNOSIS — M85832 Other specified disorders of bone density and structure, left forearm: Secondary | ICD-10-CM | POA: Diagnosis not present

## 2015-01-03 DIAGNOSIS — M85852 Other specified disorders of bone density and structure, left thigh: Secondary | ICD-10-CM | POA: Diagnosis not present

## 2015-01-16 ENCOUNTER — Ambulatory Visit (INDEPENDENT_AMBULATORY_CARE_PROVIDER_SITE_OTHER): Payer: Medicare Other | Admitting: Podiatry

## 2015-01-16 ENCOUNTER — Encounter: Payer: Self-pay | Admitting: Podiatry

## 2015-01-16 VITALS — BP 110/68 | HR 87 | Resp 11

## 2015-01-16 DIAGNOSIS — M722 Plantar fascial fibromatosis: Secondary | ICD-10-CM

## 2015-01-16 NOTE — Patient Instructions (Signed)
Orthotic Wearing Instructions  Adjust the shoe size to accommodate the custom foot orthotic  If a larger shoe is needed take the orthotic to shoe store and wear the thickness of sock that you normally would wear.   remove the shoe insole  Wear the orthotics as long as they feel comfortable. Allow your feet to adjust your wearing time.  If the orthotics become uncomfortable, remove the orthotics from the shoes and restart the next day.  Repeat this procedure daily until the orthotics are, comfortable on a continuous basis.

## 2015-01-17 ENCOUNTER — Other Ambulatory Visit: Payer: Self-pay | Admitting: Oncology

## 2015-01-17 DIAGNOSIS — C50911 Malignant neoplasm of unspecified site of right female breast: Secondary | ICD-10-CM

## 2015-01-17 NOTE — Progress Notes (Signed)
Patient ID: Barbara Rangel, female   DOB: [birth year], 69 y.o.   MRN: [id number]  Subjective: Patient presents today for dispensing of custom foot orthotics and treatment of plantar fasciitis left  Objective: Custom foot orthotics, Barbara Rangel satisfactorily  Assessment: Satisfactory fit of custom orthotic and treatment of plantar fasciitis left  Plan: Trim Spenco top cover to accommodate shoe fit Wearing instruction provided  Reappoint 6 weeks

## 2015-01-23 ENCOUNTER — Ambulatory Visit (HOSPITAL_BASED_OUTPATIENT_CLINIC_OR_DEPARTMENT_OTHER): Payer: Medicare Other | Admitting: Oncology

## 2015-01-23 ENCOUNTER — Telehealth: Payer: Self-pay | Admitting: Oncology

## 2015-01-23 ENCOUNTER — Other Ambulatory Visit (HOSPITAL_BASED_OUTPATIENT_CLINIC_OR_DEPARTMENT_OTHER): Payer: Medicare Other

## 2015-01-23 ENCOUNTER — Encounter: Payer: Self-pay | Admitting: Oncology

## 2015-01-23 VITALS — BP 122/60 | HR 80 | Temp 98.7°F | Resp 18 | Ht 65.0 in | Wt 128.2 lb

## 2015-01-23 DIAGNOSIS — C50911 Malignant neoplasm of unspecified site of right female breast: Secondary | ICD-10-CM

## 2015-01-23 DIAGNOSIS — M722 Plantar fascial fibromatosis: Secondary | ICD-10-CM | POA: Diagnosis not present

## 2015-01-23 DIAGNOSIS — Z853 Personal history of malignant neoplasm of breast: Secondary | ICD-10-CM

## 2015-01-23 DIAGNOSIS — Z86010 Personal history of colonic polyps: Secondary | ICD-10-CM | POA: Diagnosis not present

## 2015-01-23 DIAGNOSIS — R922 Inconclusive mammogram: Secondary | ICD-10-CM

## 2015-01-23 DIAGNOSIS — M899 Disorder of bone, unspecified: Secondary | ICD-10-CM | POA: Diagnosis not present

## 2015-01-23 DIAGNOSIS — M8580 Other specified disorders of bone density and structure, unspecified site: Secondary | ICD-10-CM

## 2015-01-23 LAB — CBC WITH DIFFERENTIAL/PLATELET
BASO%: 0.2 % (ref 0.0–2.0)
Basophils Absolute: 0 10*3/uL (ref 0.0–0.1)
EOS%: 0.9 % (ref 0.0–7.0)
Eosinophils Absolute: 0 10*3/uL (ref 0.0–0.5)
HCT: 43.6 % (ref 34.8–46.6)
HGB: 14.8 g/dL (ref 11.6–15.9)
LYMPH%: 28.5 % (ref 14.0–49.7)
MCH: 33 pg (ref 25.1–34.0)
MCHC: 33.9 g/dL (ref 31.5–36.0)
MCV: 97.1 fL (ref 79.5–101.0)
MONO#: 0.4 10*3/uL (ref 0.1–0.9)
MONO%: 9.2 % (ref 0.0–14.0)
NEUT#: 2.6 10*3/uL (ref 1.5–6.5)
NEUT%: 61.2 % (ref 38.4–76.8)
Platelets: 186 10*3/uL (ref 145–400)
RBC: 4.49 10*6/uL (ref 3.70–5.45)
RDW: 13.8 % (ref 11.2–14.5)
WBC: 4.2 10*3/uL (ref 3.9–10.3)
lymph#: 1.2 10*3/uL (ref 0.9–3.3)

## 2015-01-23 LAB — COMPREHENSIVE METABOLIC PANEL (CC13)
ALT: 29 U/L (ref 0–55)
AST: 28 U/L (ref 5–34)
Albumin: 4 g/dL (ref 3.5–5.0)
Alkaline Phosphatase: 64 U/L (ref 40–150)
Anion Gap: 8 mEq/L (ref 3–11)
BUN: 11.2 mg/dL (ref 7.0–26.0)
CO2: 29 mEq/L (ref 22–29)
Calcium: 9.9 mg/dL (ref 8.4–10.4)
Chloride: 107 mEq/L (ref 98–109)
Creatinine: 0.8 mg/dL (ref 0.6–1.1)
EGFR: >90 mL/min/{1.73_m2} (ref >90)
Glucose: 81 mg/dl (ref 70–140)
Potassium: 4.7 mEq/L (ref 3.5–5.1)
Sodium: 144 mEq/L (ref 136–145)
Total Bilirubin: 0.48 mg/dL (ref 0.20–1.20)
Total Protein: 7.1 g/dL (ref 6.4–8.3)

## 2015-01-23 NOTE — Progress Notes (Signed)
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 She is post right mastectomy and had left tomo mammography at [facility] 01-03-15, with breast tissue still heterogeneously dense but no other mammographic findings of concern. She had DEXA scan also at [facility] 01-03-15, lowest T score -1.6 at femur, osteopenic and 4.7% decrease since 2015.   Patient had surveillance colonoscopy by Dr Earle Gell 10-11-14, with 1 cm tubular adenoma from cecum without high grade dysplasia or malignancy, otherwise normal, 5 year follow up recommended (personal hx adenomatous polyp). PCP has changed to Dr Dorian Heckle since Dr Maxwell Caul has retired. She has regular follow up with Dr Criss Rosales for gyn.  Patient has felt well, with no complaints that seem referable to the breast cancer history or that treatment. She has not been exercising regularly since problems with plantar fasciitis, hopes to try back with water aerobics soon.  No central catheter   ONCOLOGIC HISTORY Patient found right breast cancer on breast self exam after negative mammogram same year, diagnosis June 2005 in [place], [place] ER/PR + and Her 2 negative, with patient age 69 then. She was treated with mastectomy with "9 or 11" node axillary evaluation, of which patient recalls "7 or 9" nodes were involved; she did not have local or axillary radiation. She was treated on NSABP B28 trial with dose dense adriamycin cytoxan followed by taxol, from 11-19-2003 thru 09-21-2004. She has been on adjuvant arimidex since Jan 2006. Last bone density scan was at [facility] 12-29-2012 had normal bone density in both LS and hip, improved compared with 2010 and 2013. Most recent  left mammogram was at [facility] 12-29-12, with heterogeneously dense breast tissue but otherwise no mammographic findings of concern. Last breast MRI in this EMR was Feb 2011. She had fasting lipids done by Dr Maxwell Caul 01-2013 WNL.   Review of systems as above, also: No recent infectious illness. No respiratory, GI or cardiac symptoms. Plantar fasciitis improved with interventions by podiatry. Remainder of 10 point Review of Systems negative.  Objective:  Vital signs in last 24 hours:  BP 122/60 mmHg  Pulse 80  Temp(Src) 98.7 F (37.1 C) (Oral)  Resp 18  Ht 5' 5" (1.651 m)  Wt 128 lb 3.2 oz (58.151 kg)  BMI 21.33 kg/m2  SpO2 99% Weight up 1 lb. Slender lady, excellent historian, very pleasant Alert, oriented and appropriate. Ambulatory without difficulty, using orthotics in tennis shoes  HEENT:PERRL, sclerae not icteric. Oral mucosa moist without lesions, posterior pharynx clear.  Neck supple. No JVD.  Lymphatics:no cervical,supraclavicular, axillary or inguinal adenopathy Resp: clear to auscultation bilaterally and normal percussion bilaterally Cardio: regular rate and rhythm. No gallop. GI: soft, nontender, not distended, no mass or organomegaly. Normally active bowel sounds.  Musculoskeletal/ Extremities: without pitting edema, cords, tenderness Neuro: no peripheral neuropathy. Otherwise nonfocal Skin without rash, ecchymosis, petechiae Breasts: right mastectomy scar not remarkable, left breast without dominant mass, skin or nipple findings. Axillae benign.   Lab Results:  Results for orders placed or performed in visit on 01/23/15  CBC with Differential  Result Value Ref Range   WBC 4.2 3.9 - 10.3 10e3/uL   NEUT# 2.6 1.5 - 6.5 10e3/uL   HGB 14.8 11.6 - 15.9 g/dL  HCT 43.6 34.8 - 46.6 %   Platelets 186 145 - 400 10e3/uL   MCV 97.1 79.5 - 101.0 fL   MCH 33.0 25.1 - 34.0 pg   MCHC 33.9 31.5 - 36.0 g/dL   RBC 4.49 3.70 - 5.45 10e6/uL   RDW 13.8 11.2 - 14.5 %    lymph# 1.2 0.9 - 3.3 10e3/uL   MONO# 0.4 0.1 - 0.9 10e3/uL   Eosinophils Absolute 0.0 0.0 - 0.5 10e3/uL   Basophils Absolute 0.0 0.0 - 0.1 10e3/uL   NEUT% 61.2 38.4 - 76.8 %   LYMPH% 28.5 14.0 - 49.7 %   MONO% 9.2 0.0 - 14.0 %   EOS% 0.9 0.0 - 7.0 %   BASO% 0.2 0.0 - 2.0 %  Comprehensive metabolic panel (Cmet) - [facility]  Result Value Ref Range   Sodium 144 136 - 145 mEq/L   Potassium 4.7 3.5 - 5.1 mEq/L   Chloride 107 98 - 109 mEq/L   CO2 29 22 - 29 mEq/L   Glucose 81 70 - 140 mg/dl   BUN 11.2 7.0 - 26.0 mg/dL   Creatinine 0.8 0.6 - 1.1 mg/dL   Total Bilirubin 0.48 0.20 - 1.20 mg/dL   Alkaline Phosphatase 64 40 - 150 U/L   AST 28 5 - 34 U/L   ALT 29 0 - 55 U/L   Total Protein 7.1 6.4 - 8.3 g/dL   Albumin 4.0 3.5 - 5.0 g/dL   Calcium 9.9 8.4 - 10.4 mg/dL   Anion Gap 8 3 - 11 mEq/L   EGFR >90 >90 ml/min/1.73 m2   CBC reviewed with patient at visit  Studies/Results: EXAM: [facility]15 DUAL X-RAY ABSORPTIOMETRY (DXA) FOR BONE MINERAL DENSITY  FINDINGS: DISTAL THIRD OF THE LEFT RADIUS  Bone Mineral Density (BMD): 0.640 g/cm2  Young Adult T-Score: -0.9  Z-Score: 1.0  LEFT FEMUR NECK  Bone Mineral Density (BMD): 0.675 g/cm2  Young Adult T-Score: -1.6  Z-Score: -0.6  ASSESSMENT: Patient's diagnostic category is LOW BONE MASS by WHO Criteria.  FRACTURE RISK: Moderate  FRAX: Not assessed, patient being treated for osteoporosis.  COMPARISON: Multiple prior exams, most recent dated 12/31/2013. There has been a 4.7% decrease in bone density for the proximal left femur since the most recent prior exam.     EXAM:  [facility] 01-[facility]-15 DIGITAL SCREENING UNILATERAL LEFT MAMMOGRAM WITH TOMO AND CAD  COMPARISON: Previous exam(s).  ACR Breast Density Category c: The breast tissue is heterogeneously dense, which may obscure small masses.  FINDINGS: The patient has had a right mastectomy. There are no findings suspicious for malignancy.  Images were processed with CAD.  IMPRESSION: No mammographic evidence of malignancy. A result letter of this screening mammogram will be mailed directly to the patient.  RECOMMENDATION: Screening mammogram in one year. (Code:[id number])  BI-RADS CATEGORY 1: Negative.       Medications: I have reviewed the patient's current medications. Continuing calcium and D  DISCUSSION: We have discussed all of interval history as above, and I have given her copy of the bone density report. Patient encouraged to resume regular weight bearing exercise as possible. We have discussed fact that there is more interest now in continuing hormonal blockers longer term, particularly in high risk situations such as multiple node + breast cancers, tho optimal duration of treatment is not known. As she continues to do very well with the arimidex, she will continue this for now, 6 month follow up ongoing at this office.  Assessment/Plan:  1.T2N1 right  breast cancer: diagnosed June 2005, features and treatment as above. Clinically doing well on continued arimidex. Yearly left mammogram, tomo best with dense breast tissue. With known low bone density and chronic high risk medication aromatase inhibitor, it is medically necessary to have yearly bone density scan.  Fasting lipids by PCP (thank you). Return visit 6 months. 2.improvement in pelvic symptoms with pelvic physical therapy  3.hx colon polyps: colonoscopy 10-2014 as above, 5 year follow up recommended. 4.hx recurrent UTIs, no symptoms currently  5.osteopenia, slightly lower since 2015. Resume weight bearing exercise and continue all other interventions to maintain/ improve bone density, which are particularly important given ongoing therapy with high risk aromatase inhibitor. Medically necessary to have bone density scans yearly on the aromatase inhibitor. 6.flu vaccine done 7.plantar fasciitis: improving with interventions by podiatry 8. Improvement but  not resolution of pelvic discomfort with pelvic physical therapy previously  All questions answered and patient knows that she can call at any time if concerns prior to next scheduled visit. Time spent 25 min including >50% counseling and coordination of care. Cc this note to Dr Michail Sermon and Dr Clint Bolder, MD   01/23/2015, 10:51 AM

## 2015-01-23 NOTE — Telephone Encounter (Signed)
appts made an avs printed for pt  Barbara Rangel

## 2015-01-24 ENCOUNTER — Other Ambulatory Visit: Payer: Self-pay | Admitting: Oncology

## 2015-01-24 DIAGNOSIS — Z86010 Personal history of colonic polyps: Secondary | ICD-10-CM | POA: Insufficient documentation

## 2015-01-24 DIAGNOSIS — M8580 Other specified disorders of bone density and structure, unspecified site: Secondary | ICD-10-CM

## 2015-01-24 DIAGNOSIS — M722 Plantar fascial fibromatosis: Secondary | ICD-10-CM | POA: Insufficient documentation

## 2015-01-24 DIAGNOSIS — C50911 Malignant neoplasm of unspecified site of right female breast: Secondary | ICD-10-CM

## 2015-01-24 DIAGNOSIS — R922 Inconclusive mammogram: Secondary | ICD-10-CM | POA: Insufficient documentation

## 2015-01-24 HISTORY — DX: Other specified disorders of bone density and structure, unspecified site: M85.80

## 2015-01-25 DIAGNOSIS — I779 Disorder of arteries and arterioles, unspecified: Secondary | ICD-10-CM | POA: Diagnosis not present

## 2015-01-25 DIAGNOSIS — I10 Essential (primary) hypertension: Secondary | ICD-10-CM | POA: Diagnosis not present

## 2015-01-25 DIAGNOSIS — Z0000 Encounter for general adult medical examination without abnormal findings: Secondary | ICD-10-CM | POA: Diagnosis not present

## 2015-01-25 DIAGNOSIS — Z1389 Encounter for screening for other disorder: Secondary | ICD-10-CM | POA: Diagnosis not present

## 2015-02-02 ENCOUNTER — Telehealth: Payer: Self-pay | Admitting: *Deleted

## 2015-02-02 ENCOUNTER — Other Ambulatory Visit: Payer: Self-pay | Admitting: Oncology

## 2015-02-02 DIAGNOSIS — C50911 Malignant neoplasm of unspecified site of right female breast: Secondary | ICD-10-CM

## 2015-02-02 NOTE — Telephone Encounter (Signed)
Son called to get appointment times for 02/09/15.  Same given.

## 2015-02-08 DIAGNOSIS — H4011X1 Primary open-angle glaucoma, mild stage: Secondary | ICD-10-CM | POA: Diagnosis not present

## 2015-02-22 ENCOUNTER — Telehealth: Payer: Self-pay

## 2015-02-22 NOTE — Telephone Encounter (Signed)
Faxed signed order dated 02-20-15 to [facility] [employer] for breast prosthesis.  Sent a copy to HIM to be scanned into patient's EMR.

## 2015-02-27 ENCOUNTER — Encounter: Payer: Self-pay | Admitting: Podiatry

## 2015-02-27 ENCOUNTER — Ambulatory Visit (INDEPENDENT_AMBULATORY_CARE_PROVIDER_SITE_OTHER): Payer: Medicare Other | Admitting: Podiatry

## 2015-02-27 VITALS — BP 120/68 | HR 80 | Resp 12

## 2015-02-27 DIAGNOSIS — M722 Plantar fascial fibromatosis: Secondary | ICD-10-CM | POA: Diagnosis not present

## 2015-02-27 NOTE — Patient Instructions (Signed)
To you where your foot orthotics in your athletic style shoes and ongoing continuous basis until all foot pain is resolved  Plantar Fasciitis Plantar fasciitis is a common condition that causes foot pain. It is soreness (inflammation) of the band of tough fibrous tissue on the bottom of the foot that runs from the heel bone (calcaneus) to the ball of the foot. The cause of this soreness may be from excessive standing, poor fitting shoes, running on hard surfaces, being overweight, having an abnormal walk, or overuse (this is common in runners) of the painful foot or feet. It is also common in aerobic exercise dancers and ballet dancers. SYMPTOMS  Most people with plantar fasciitis complain of:  Severe pain in the morning on the bottom of their foot especially when taking the first steps out of bed. This pain recedes after a few minutes of walking.  Severe pain is experienced also during walking following a long period of inactivity.  Pain is worse when walking barefoot or up stairs DIAGNOSIS   Your caregiver will diagnose this condition by examining and feeling your foot.  Special tests such as X-rays of your foot, are usually not needed. PREVENTION   Consult a sports medicine professional before beginning a new exercise program.  Walking programs offer a good workout. With walking there is a lower chance of overuse injuries common to runners. There is less impact and less jarring of the joints.  Begin all new exercise programs slowly. If problems or pain develop, decrease the amount of time or distance until you are at a comfortable level.  Wear good shoes and replace them regularly.  Stretch your foot and the heel cords at the back of the ankle (Achilles tendon) both before and after exercise.  Run or exercise on even surfaces that are not hard. For example, asphalt is better than pavement.  Do not run barefoot on hard surfaces.  If using a treadmill, vary the incline.  Do not  continue to workout if you have foot or joint problems. Seek professional help if they do not improve. HOME CARE INSTRUCTIONS   Avoid activities that cause you pain until you recover.  Use ice or cold packs on the problem or painful areas after working out.  Only take over-the-counter or prescription medicines for pain, discomfort, or fever as directed by your caregiver.  Soft shoe inserts or athletic shoes with air or gel sole cushions may be helpful.  If problems continue or become more severe, consult a sports medicine caregiver or your own health care provider. Cortisone is a potent anti-inflammatory medication that may be injected into the painful area. You can discuss this treatment with your caregiver. MAKE SURE YOU:   Understand these instructions.  Will watch your condition.  Will get help right away if you are not doing well or get worse. Document Released: 07/23/2001 Document Revised: 01/20/2012 Document Reviewed: 09/21/2008 [facility] Patient Information 2015 [place], [place]. This information is not intended to replace advice given to you by your health care provider. Make sure you discuss any questions you have with your health care provider.

## 2015-02-28 NOTE — Progress Notes (Signed)
Patient ID: Barbara Rangel, female   DOB: September 05, 1946, 69 y.o.   MRN: [id number]  Subjective: This patient presents today for follow-up care for left plantar fasciitis. She says she wears a custom foot orthotics an ongoing continuous basis and states that her left f arch pain has improved considerably. She is also requesting a custom arch strap made for her today when she is not able to wear her custom foot orthotics  Objective: Orientated 3  There is no irritation from custom foot orthotics noted There is palpable tenderness in the medial plantar fascial insertional area left without any palpable lesions   Assessment: Improving plantar fasciitis left  Plan: Patient is instructed to wear custom foot orthotics an ongoing continuous basis Made a replacement removable arch pad to wear on the left foot when not wearing orthotic  Maintain stretching Maintain athletic style shoes  Patient is discharged

## 2015-05-10 DIAGNOSIS — H4011X1 Primary open-angle glaucoma, mild stage: Secondary | ICD-10-CM | POA: Diagnosis not present

## 2015-07-17 ENCOUNTER — Other Ambulatory Visit: Payer: Self-pay | Admitting: Oncology

## 2015-07-18 ENCOUNTER — Telehealth: Payer: Self-pay | Admitting: Oncology

## 2015-07-18 NOTE — Telephone Encounter (Signed)
Returned Advertising account executive. Left message confirmed appointment for September.

## 2015-07-19 ENCOUNTER — Telehealth: Payer: Self-pay

## 2015-07-19 NOTE — Telephone Encounter (Signed)
Faxed signed form dated 07-18-15 to Lauren for patient to participate in the [place] at the [facility].  A copy was sent to HIM to be scanned into patient's EMR.

## 2015-07-24 ENCOUNTER — Ambulatory Visit (HOSPITAL_BASED_OUTPATIENT_CLINIC_OR_DEPARTMENT_OTHER): Payer: Medicare Other | Admitting: Oncology

## 2015-07-24 ENCOUNTER — Other Ambulatory Visit (HOSPITAL_BASED_OUTPATIENT_CLINIC_OR_DEPARTMENT_OTHER): Payer: Medicare Other

## 2015-07-24 ENCOUNTER — Telehealth: Payer: Self-pay | Admitting: Oncology

## 2015-07-24 ENCOUNTER — Encounter: Payer: Self-pay | Admitting: Oncology

## 2015-07-24 VITALS — BP 129/68 | HR 80 | Temp 98.5°F | Resp 18 | Ht 65.0 in | Wt 131.4 lb

## 2015-07-24 DIAGNOSIS — Z853 Personal history of malignant neoplasm of breast: Secondary | ICD-10-CM

## 2015-07-24 DIAGNOSIS — E2839 Other primary ovarian failure: Secondary | ICD-10-CM | POA: Diagnosis not present

## 2015-07-24 DIAGNOSIS — C50911 Malignant neoplasm of unspecified site of right female breast: Secondary | ICD-10-CM | POA: Diagnosis not present

## 2015-07-24 DIAGNOSIS — Z79899 Other long term (current) drug therapy: Secondary | ICD-10-CM | POA: Diagnosis not present

## 2015-07-24 DIAGNOSIS — M899 Disorder of bone, unspecified: Secondary | ICD-10-CM | POA: Diagnosis not present

## 2015-07-24 DIAGNOSIS — Z1231 Encounter for screening mammogram for malignant neoplasm of breast: Secondary | ICD-10-CM

## 2015-07-24 DIAGNOSIS — M8580 Other specified disorders of bone density and structure, unspecified site: Secondary | ICD-10-CM

## 2015-07-24 DIAGNOSIS — Z86010 Personal history of colonic polyps: Secondary | ICD-10-CM

## 2015-07-24 DIAGNOSIS — R922 Inconclusive mammogram: Secondary | ICD-10-CM

## 2015-07-24 LAB — CBC WITH DIFFERENTIAL/PLATELET
BASO%: 0.5 % (ref 0.0–2.0)
Basophils Absolute: 0 10*3/uL (ref 0.0–0.1)
EOS%: 1.8 % (ref 0.0–7.0)
Eosinophils Absolute: 0.1 10*3/uL (ref 0.0–0.5)
HCT: 42.9 % (ref 34.8–46.6)
HGB: 14.5 g/dL (ref 11.6–15.9)
LYMPH%: 35.1 % (ref 14.0–49.7)
MCH: 32.4 pg (ref 25.1–34.0)
MCHC: 33.8 g/dL (ref 31.5–36.0)
MCV: 96 fL (ref 79.5–101.0)
MONO#: 0.4 10*3/uL (ref 0.1–0.9)
MONO%: 9.8 % (ref 0.0–14.0)
NEUT#: 2 10*3/uL (ref 1.5–6.5)
NEUT%: 52.8 % (ref 38.4–76.8)
Platelets: 172 10*3/uL (ref 145–400)
RBC: 4.47 10*6/uL (ref 3.70–5.45)
RDW: 14.1 % (ref 11.2–14.5)
WBC: 3.8 10*3/uL — ABNORMAL LOW (ref 3.9–10.3)
lymph#: 1.3 10*3/uL (ref 0.9–3.3)

## 2015-07-24 LAB — COMPREHENSIVE METABOLIC PANEL (CC13)
ALT: 24 U/L (ref 0–55)
AST: 27 U/L (ref 5–34)
Albumin: 3.9 g/dL (ref 3.5–5.0)
Alkaline Phosphatase: 63 U/L (ref 40–150)
Anion Gap: 6 mEq/L (ref 3–11)
BUN: 16.3 mg/dL (ref 7.0–26.0)
CO2: 28 mEq/L (ref 22–29)
Calcium: 9.8 mg/dL (ref 8.4–10.4)
Chloride: 109 mEq/L (ref 98–109)
Creatinine: 0.8 mg/dL (ref 0.6–1.1)
EGFR: >90 mL/min/{1.73_m2} (ref >90)
Glucose: 73 mg/dl (ref 70–140)
Potassium: 4 mEq/L (ref 3.5–5.1)
Sodium: 143 mEq/L (ref 136–145)
Total Bilirubin: 0.48 mg/dL (ref 0.20–1.20)
Total Protein: 7 g/dL (ref 6.4–8.3)

## 2015-07-24 NOTE — Telephone Encounter (Signed)
Appointments made and avs printed for patient,pof printed and kept for dr. livesay 17month appointment

## 2015-07-24 NOTE — Progress Notes (Signed)
OFFICE PROGRESS NOTE   July 24, 2015   Physicians:Karen Schooler (PCP), Huston Foley (neurology), Lelon Perla, Diedre [place] (gyn, [facility], [place]), Danise Edge  INTERVAL HISTORY:  Patient is seen, alone for visit, in scheduled 6 month follow up of continuing adjuvant treatment with Arimidex for node positive right breast cancer. She has been on Arimidex since Jan 2006, this following mastectomy with multiple nodes involved, and adjuvant chemotherapy.  Most recent left tomo mammogram was at [facility] 01-03-15, with heterogeneously dense breast tissue but no other findings of concern. Bone density scan also at [facility] 01-03-15 was osteopenic, with some decrease in femur as compared with study 2015.   Patient continues Fosamax, calcium and Vit D. She exercises regularly. Plantar fasciitis on left has improved with orthotic support. She has LE cramps intermittently at night, R>L; suggested drinking 8-16 oz sports drink daily. She has had no illness or other changes since she was here last.  No central catheter   ONCOLOGIC HISTORY Patient found right breast cancer on breast self exam after negative mammogram same year, diagnosis June 2005 in [place], [place] ER/PR + and Her 2 negative, with patient age 69 then. She was treated with mastectomy with "9 or 11" node axillary evaluation, of which patient recalls "7 or 9" nodes were involved; she did not have local or axillary radiation. She was treated on NSABP B28 trial with dose dense adriamycin cytoxan followed by taxol, from 11-19-2003 thru 09-21-2004. She has been on adjuvant arimidex since Jan 2006. Last bone density scan was at [facility] 12-29-2012 had normal bone density in both LS and hip, improved compared with 2010 and 2013. Most recent left mammogram was at [facility] 12-29-12, with heterogeneously dense breast tissue but otherwise no mammographic findings of concern. Last breast MRI in this EMR was Feb 2011.      Review of systems as above, also: No changes in left breast or at mastectomy. No respiratory, GI, other musculoskeletal symptoms Remainder of 10 point Review of Systems negative.  Objective:  Vital signs in last 24 hours:  BP 129/68 mmHg  Pulse 80  Temp(Src) 98.5 F (36.9 C) (Oral)  Resp 18  Ht 5\' 5"  (1.651 m)  Wt 131 lb 6.4 oz (59.603 kg)  BMI 21.87 kg/m2  SpO2 100% Weight up 3 lbs Alert, oriented and appropriate. Ambulatory without difficulty.   HEENT:PERRL, sclerae not icteric. Oral mucosa moist without lesions, posterior pharynx clear.  Neck supple. No JVD.  Lymphatics:no cervical,supraclavicular, axillary or inguinal adenopathy Resp: clear to auscultation bilaterally and normal percussion bilaterally Cardio: regular rate and rhythm. No gallop. GI: soft, nontender, not distended, no mass or organomegaly. Normally active bowel sounds. Musculoskeletal/ Extremities: without pitting edema, cords, tenderness Neuro:  nonfocal   PSYCH appropriate mood and affect Skin without rash, ecchymosis, petechiae Breasts: Right mastectomy scar without evidence of local recurrence. Left breast without dominant mass, skin or nipple findings. Axillae benign.   Lab Results:  Results for orders placed or performed in visit on 07/24/15  CBC with Differential  Result Value Ref Range   WBC 3.8 (L) 3.9 - 10.3 10e3/uL   NEUT# 2.0 1.5 - 6.5 10e3/uL   HGB 14.5 11.6 - 15.9 g/dL   HCT 09/23/15 32.4 - 69.9 %   Platelets 172 145 - 400 10e3/uL   MCV 96.0 79.5 - 101.0 fL   MCH 32.4 25.1 - 34.0 pg   MCHC 33.8 31.5 - 36.0 g/dL   RBC 78.0 2.08 - 9.10 10e6/uL  RDW 14.1 11.2 - 14.5 %   lymph# 1.3 0.9 - 3.3 10e3/uL   MONO# 0.4 0.1 - 0.9 10e3/uL   Eosinophils Absolute 0.1 0.0 - 0.5 10e3/uL   Basophils Absolute 0.0 0.0 - 0.1 10e3/uL   NEUT% 52.8 38.4 - 76.8 %   LYMPH% 35.1 14.0 - 49.7 %   MONO% 9.8 0.0 - 14.0 %   EOS% 1.8 0.0 - 7.0 %   BASO% 0.5 0.0 - 2.0 %  Comprehensive metabolic panel (Cmet)  - [facility]  Result Value Ref Range   Sodium 143 136 - 145 mEq/L   Potassium 4.0 3.5 - 5.1 mEq/L   Chloride 109 98 - 109 mEq/L   CO2 28 22 - 29 mEq/L   Glucose 73 70 - 140 mg/dl   BUN 16.3 7.0 - 26.0 mg/dL   Creatinine 0.8 0.6 - 1.1 mg/dL   Total Bilirubin 0.48 0.20 - 1.20 mg/dL   Alkaline Phosphatase 63 40 - 150 U/L   AST 27 5 - 34 U/L   ALT 24 0 - 55 U/L   Total Protein 7.0 6.4 - 8.3 g/dL   Albumin 3.9 3.5 - 5.0 g/dL   Calcium 9.8 8.4 - 10.4 mg/dL   Anion Gap 6 3 - 11 mEq/L   EGFR >90 >90 ml/min/1.73 m2     Studies/Results:  No results found.  Medications: I have reviewed the patient's current medications. She plans to have flu vaccine by PCP this fall  DISCUSSION: I have told patient that my partners in breast oncology, as well as many others in the field, are most comfortable continuing hormonal blockers beyond 5-10 years in patients who are at high risk for recurrent breast cancer as long as the drugs are tolerated adequately. She understands that studies have not completed out further than 10 years, but understands rationale for treatment and is in full agreement with this. WIll continue to follow at this office every 6 months while under treatment. It is medically necessary in this situation to follow bone density scans yearly  Assessment/Plan: 1..T2N1 right breast cancer: diagnosed June 2005, features and treatment as above. Clinically doing well on continued arimidex. Yearly left mammogram, tomo best with dense breast tissue. With known low bone density and chronic high risk medication aromatase inhibitor, it is medically necessary to have yearly bone density scan. Fasting lipids by PCP. Return visit 6 months. 2.improvement in pelvic symptoms with pelvic physical therapy  3.hx colon polyps: colonoscopy 10-2014, 5 year follow up recommended. 4.hx recurrent UTIs, no symptoms currently  5.osteopenia, slightly lower since 2015. Resume weight bearing exercise and continue all  other interventions to maintain/ improve bone density, which are particularly important given ongoing therapy with high risk aromatase inhibitor. Medically necessary to have bone density scans yearly on the aromatase inhibitor. 6.flu vaccine by PCP this fall 7.plantar fasciitis: improving with interventions by podiatry    All questions answered and patient is in agreement with recommendations and plans above. Time spent 20 min including >50% counseling and coordination of care. Cc Dr K.Schooler and Dr Cheryle Horsfall Lillia Carmel, MD   07/24/2015, 6:00 PM

## 2015-07-25 ENCOUNTER — Other Ambulatory Visit: Payer: Self-pay | Admitting: Oncology

## 2015-07-25 DIAGNOSIS — C50911 Malignant neoplasm of unspecified site of right female breast: Secondary | ICD-10-CM

## 2015-07-29 DIAGNOSIS — Z1231 Encounter for screening mammogram for malignant neoplasm of breast: Secondary | ICD-10-CM

## 2015-07-29 DIAGNOSIS — Z79899 Other long term (current) drug therapy: Secondary | ICD-10-CM

## 2015-07-29 DIAGNOSIS — E2839 Other primary ovarian failure: Secondary | ICD-10-CM | POA: Insufficient documentation

## 2015-08-02 ENCOUNTER — Other Ambulatory Visit: Payer: Self-pay | Admitting: Internal Medicine

## 2015-08-02 DIAGNOSIS — I10 Essential (primary) hypertension: Secondary | ICD-10-CM | POA: Diagnosis not present

## 2015-08-02 DIAGNOSIS — C50911 Malignant neoplasm of unspecified site of right female breast: Secondary | ICD-10-CM | POA: Diagnosis not present

## 2015-08-02 DIAGNOSIS — I739 Peripheral vascular disease, unspecified: Principal | ICD-10-CM

## 2015-08-02 DIAGNOSIS — I779 Disorder of arteries and arterioles, unspecified: Secondary | ICD-10-CM | POA: Diagnosis not present

## 2015-08-02 DIAGNOSIS — Z23 Encounter for immunization: Secondary | ICD-10-CM | POA: Diagnosis not present

## 2015-08-04 ENCOUNTER — Ambulatory Visit
Admission: RE | Admit: 2015-08-04 | Discharge: 2015-08-04 | Disposition: A | Discharge status: Home / Self Care | Payer: Medicare Other | Source: Ambulatory Visit | Attending: Internal Medicine | Admitting: Internal Medicine

## 2015-08-04 DIAGNOSIS — I6523 Occlusion and stenosis of bilateral carotid arteries: Secondary | ICD-10-CM | POA: Diagnosis not present

## 2015-08-04 DIAGNOSIS — I10 Essential (primary) hypertension: Secondary | ICD-10-CM | POA: Diagnosis not present

## 2015-08-04 DIAGNOSIS — I779 Disorder of arteries and arterioles, unspecified: Secondary | ICD-10-CM

## 2015-08-04 DIAGNOSIS — I739 Peripheral vascular disease, unspecified: Principal | ICD-10-CM

## 2015-08-07 DIAGNOSIS — H4011X1 Primary open-angle glaucoma, mild stage: Secondary | ICD-10-CM | POA: Diagnosis not present

## 2015-08-24 ENCOUNTER — Encounter: Payer: Self-pay | Admitting: Neurology

## 2015-08-24 ENCOUNTER — Ambulatory Visit (INDEPENDENT_AMBULATORY_CARE_PROVIDER_SITE_OTHER): Payer: Medicare Other | Admitting: Neurology

## 2015-08-24 VITALS — BP 134/78 | HR 80 | Resp 16 | Ht 65.0 in | Wt 128.0 lb

## 2015-08-24 DIAGNOSIS — R419 Unspecified symptoms and signs involving cognitive functions and awareness: Secondary | ICD-10-CM | POA: Diagnosis not present

## 2015-08-24 DIAGNOSIS — G609 Hereditary and idiopathic neuropathy, unspecified: Secondary | ICD-10-CM

## 2015-08-24 NOTE — Patient Instructions (Addendum)
Your exam and memory scores are stable. You had formal cognitive testing in August 6962 and we can certainly repeat this for comparison at this time. I will make a referral. Dr. Monico Hoar office will be in touch directly with you.   I think overall you are doing well but I do want to suggest a few things today:  Remember to drink plenty of fluid, eat healthy meals and do not skip any meals. Try to eat protein with a every meal and eat a healthy snack such as fruit or nuts in between meals. Try to keep a regular sleep-wake schedule and try to exercise daily, particularly in the form of walking, 20-30 minutes a day, if you can. Good nutrition, proper sleep and exercise can help her cognitive function.  Engage in social activities in your community and with your family and try to keep up with current events by reading the newspaper or watching the news. If you have computer and can go online, try [email or web address]. Also, you may like to do word finding puzzles or crossword puzzles.  As far as your medications are concerned, I would like to suggest no new meds.   As far as diagnostic testing: no other new test.   You can try Melatonin at night for sleep: take 3 to 5 mg one hour before your bedtime, I would not combine 2 benadryl with it, maybe just one or no benadryl to avoid excess sedation.     I can see you back in 1 year.

## 2015-08-24 NOTE — Progress Notes (Signed)
Subjective:    Patient ID: Barbara Rangel is a 69 y.o. female.  HPI     Interim history:   [name] is a very pleasant 69 year old right-handed woman with an underlying medical history of breast cancer in 2005 (s/p R mastectomy and chemotherapy, no radiation), hypertension, degenerative arthritis, glaucoma, chronic UTI, colonic polyps and constipation. She is unaccompanied today. I last saw her on 05/16/2014, at which time she reported feeling fairly stable. Her memory and neuropathy symptoms were stable. She was on medicine for reflux disease. She was also taking as needed Maalox. She felt she was coping reasonably well since her son's death. She had problems with sleep maintenance. She was using 2 extra strength Tylenol and Benadryl each night which she felt helped her achieve about 6 hours of sleep on average. Her exam was stable. Her MMSE was 29/30, AFT was 17 per minute, clock drawing was 4 out of 4. I suggested an as needed follow-up.  Today, 08/24/2015: she reports that her memory and cognitive function improved for a while, feels, concentration and attention, short term memory is worse, slower mental processing and word finding difficulties in the past few months. She takes 2 benadryl each night, which helps with her drainage. She had a carotid Doppler study in September 2016 which we reviewed together, right ICA was less then 50% stenotic, left ICA was less than 70% stenotic and regular surveillance was recommended. She has been taking a baby aspirin. She is active physically. She tries to drink enough water and avoids caffeine. She has water aerobics classes Monday through Friday and an exercise class at the [facility] for mild isometric exercises.    Previously:  I saw her on 11/15/2013, at which time she reported improved right upper extremity weakness and unchanged numbness of her feet. She had reported mild memory loss. She had neuropsychological testing in August 2014. This showed  normal findings. She was noted to have stable findings of neuropathy.  I saw her on 05/13/13, at which time I suggested neurocognitive testing because of her complaint of memory loss. Her MMSE at that time was 27/30. Her neuropathy was stable and her right upper extremity weakness was improved after PT.   She had cognitive testing on 07/01/2013 and 07/09/2013 respectively and I reviewed the test results on 07/12/2013. Essentially she had a normal neurocognitive test results. I reviewed the findings with her in 1/15 and provided her with a copy of the test results.   She lost her son in October 2014, which was a stressful time for her. She has been raising her now 80 year old grandson for the past 5 years.   I first met her on 12/21/2012 at which time I suggested a brain and C-spine MRI. She was then seen back on 01/20/2013, at which time we went over her test results. Her C-spine MRI showed disc bulging with facet hypertrophy and mild spinal stenosis and severe biforaminal stenosis at C6-7, disc bulging with slight contact upon anterior spinal cord, facet hypertrophy with severe biforaminal stenosis at C5-6, disc bulging with facet hypertrophy and biforaminal stenosis at C4-5. Her brain MRI with and without contrast on 01/05/2013 showed few punctate foci of nonspecific lesions in the right greater than left subcortical white matter, no enhancing lesions. She has an underlying history of breast cancer diagnosed in 2005 and treated with lumpectomy, then mastectomy and high-dose chemotherapy as part of clinical trial. 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 She has a history of head injury in  November 2013. I suggested a neurosurgical consultation for her neck degenerative disc disease for symptomatic treatment of her neck muscle spasms I suggested a trial of baclofen.   She saw Dr. Trenton Gammon for her degenerative neck d/s and went through PT for 12 weeks, which helped. She was having a lot of leg  cramps and these improved with drinking coconut water, and taking Mg 250 mg daily, and mustard.   She had C. Doppler study and was told she had mild plaques. She has since then been started on ASA 81 mg and generic Lipitor at 20 mg.  Her Past Medical History Is Significant For: Past Medical History  Diagnosis Date  . PONV (postoperative nausea and vomiting)   . Other and unspecified general anesthetics causing adverse effect in therapeutic use     hard to wake up  . Chronic back pain     right radicular leg pain  . Constipation     r/t pain meds and takes Dulcolax nightly  . FHx: colonic polyps     hx of and mother had colon cancer  . Chronic urinary tract infection     takes Macrodantin and Cranberry daily  . Glaucoma     uses Xalantan nightly  . Cancer ([place])     hx breast cancer-right  . Hypertension     takes Amlodipine daily  . Arthritis     degenerative arthritis    Her Past Surgical History Is Significant For: Past Surgical History  Procedure Laterality Date  . Tubal ligation  1980  . Eye surgery  2004    right eye-detached retina  . Cataract surgery  2005    right eye  . Right breast biopsy  2005  . Breast lumpectomy      x2  . Left breast biopsy  2009  . Ankle surgery  2011    left ankle with screws and plates  . Colonoscopy    . Retinal detachment surgery  2004  . Mastectomy  2005    right  d/t breast cancer;no sticks to right arm LYMPH NODES REMOVED.  . Lumbar laminectomy/decompression microdiscectomy  10/31/2011    Procedure: LUMBAR LAMINECTOMY/DECOMPRESSION MICRODISCECTOMY;  Surgeon: Dahlia Bailiff;  Location: [facility];  Service: Orthopedics;  Laterality: Right;  L4-5 Decompression with Right Facet Decompression   . Colonoscopy with propofol N/A 10/11/2014    Procedure: COLONOSCOPY WITH PROPOFOL;  Surgeon: Garlan Fair, MD;  Location: [facility];  Service: Endoscopy;  Laterality: N/A;    Her Family History Is Significant For: Family History  Problem  Relation Age of Onset  . Anesthesia problems Neg Hx   . Hypotension Neg Hx   . Malignant hyperthermia Neg Hx   . Pseudochol deficiency Neg Hx   . Colon cancer Mother     Her Social History Is Significant For: Social History   Social History  . Marital Status: Married    Spouse Name: Doren Custard  . Number of Children: 4  . Years of Education: BS   Occupational History  . Retired    Social History Main Topics  . Smoking status: Never Smoker   . Smokeless tobacco: Never Used  . Alcohol Use: No  . Drug Use: No  . Sexual Activity: Yes   Other Topics Concern  . None   Social History Narrative   Pt lives at home with spouse.   Caffeine Use: none    Her Allergies Are:  Allergies  Allergen Reactions  . Avelox [Moxifloxacin Hcl In  Nacl] Other (See Comments)    Severe headache   . Tramadol Nausea Only and Other (See Comments)    Nausea and Dizziness, too.  . Trimethoprim Other (See Comments)    Rapid heartbeat.  . Cephalexin     Caused headache  . Citalopram Hydrobromide     Pt can't remember what the side effect was.  . Quinolones   . Cephalexin Other (See Comments)    Causes headache  :   Her Current Medications Are:  Outpatient Encounter Prescriptions as of 08/24/2015  Medication Sig  . acetaminophen (TYLENOL) 500 MG tablet Take 1,000 mg by mouth at bedtime.  Marland Kitchen alendronate (FOSAMAX) 70 MG tablet Take 1 tablet (70 mg total) by mouth every 7 (seven) days. Hold while in hospital. Thursday.  Take with a full glass of water on an empty stomach.  Marland Kitchen amLODipine (NORVASC) 5 MG tablet Take 1 tablet (5 mg total) by mouth daily. Take in evening or at bedtime (Patient taking differently: Take 5 mg by mouth at bedtime. )  . anastrozole (ARIMIDEX) 1 MG tablet Take 1 tablet by mouth  daily  . aspirin EC 81 MG tablet Take 81 mg by mouth every morning.   Marland Kitchen atorvastatin (LIPITOR) 20 MG tablet Take 1 tablet by mouth every morning.  . calcium citrate-vitamin D (CITRACAL+D) 315-200  MG-UNIT per tablet Take 1 tablet by mouth 2 (two) times daily.   . Cranberry Extract 250 MG TABS Take 1 capsule by mouth at bedtime.   Marland Kitchen DIPHENHYDRAMINE HCL PO Take 25 mg by mouth at bedtime.   . docusate sodium (COLACE) 100 MG capsule Take 100 mg by mouth 2 (two) times daily.    Marland Kitchen latanoprost (XALATAN) 0.005 % ophthalmic solution Place 1 drop into both eyes at bedtime.    Marland Kitchen loratadine (CLARITIN REDITABS) 10 MG dissolvable tablet Take 10 mg by mouth daily as needed for allergies. For allergies  . Magnesium 250 MG TABS Take 250 mg by mouth at bedtime.   . Multiple Vitamins-Minerals (MULTIVITAMINS THER. W/MINERALS) TABS Take 1 tablet by mouth every morning.   . nitrofurantoin (MACRODANTIN) 50 MG capsule Take 50 mg by mouth 2 (two) times daily.  Marland Kitchen omeprazole (PRILOSEC) 40 MG capsule Take 40 mg by mouth every morning.    No facility-administered encounter medications on file as of 08/24/2015.  :  Review of Systems:  Out of a complete 14 point review of systems, all are reviewed and negative with the exception of these symptoms as listed below:   Review of Systems  Neurological:       Patient would like to discuss her memory loss today, reports that she feels it is getting worse.   Has numbness in both arms mostly in R arm and hand, numbness in both feet, bilateral leg cramps.     Objective:  Neurologic Exam  Physical Exam Physical Examination:   Filed Vitals:   08/24/15 1014  BP: 134/78  Pulse: 80  Resp: 16    General Examination: The patient is a very pleasant 69 y.o. female in no acute distress. She appears well-developed and well-nourished and well groomed. She is in good spirits today.  On 05/13/13: 27/30, CDT 4/4, AFT 8.  On 05/16/2014: MMSE 29/30, AFT was 17 per minute, clock drawing was 4 out of 4.   On 08/24/2015: MMSE: 30/30, CDT: 4/4, AFT: 17/min.   HEENT exam: Normocephalic, atraumatic, pupils are equal, round and reactive to light, extraocular tracking is good without  nystagmus. Funduscopic exam  is normal, with s/p R cataract repair, mild cataract on the L. Hearing is intact. Speech is clear. Neck is supple with full range of motion and without carotid bruits. She has mild neck muscle tenderness and tightness particularly in the left posterior neck. She has no obvious hypertrophy in her neck muscles. She is somewhat tender to palpation to deep palpation in the left posterior neck muscles.  Face is symmetric with normal facial animation. Airway exam is clear with very mild pharyngeal erythema noted. Tongue protrudes centrally and palate elevates symmetrically. Chest is clear to auscultation, heart sounds are normal and abdominal sounds are normal as well, abdomen is soft and nontender. She has no pitting edema in the distal lower extremities bilaterally. Skin is warm and dry. No trophic changes are seen, but she has has b/l toenail fungus. No joint deformities are noted. Neurologically: Mental status: The patient is awake, alert and oriented in all 4 spheres. Her memory, attention, language and knowledge are appropriate. Cranial nerves II through XII are as described above under HEENT exam. In addition, shoulder shrug is normal and strength. Motor exam: Normal bulk, and tone is noted. Strength exam is normal today. She has no focal atrophy, no fasciculations and no pain on deep palpation. Reflexes are 1-2+ in the upper extremities, trace in her knees and absent in her ankles, toes equivocal b/l. Cerebellar testing shows no dysmetria or intention tremor and fine motor skills are intact. She has no truncal or gait ataxia. Sensory exam is decreased to pinprick sensation in the feet, unchanged and mildly reduced to vibration sense in the distal legs and she is more sensitive to PP sensation in the distal legs, decrease sense to temperature in distal legs as well, largely unchanged.           Assessment and Plan:   In summary, Ms. Bautch is a 69 year old right-handed woman  with an underlying medical history of breast cancer in 2005 (s/p R mastectomy and chemotherapy, no radiation), hypertension, degenerative arthritis, glaucoma, chronic UTI, colonic polyps and constipation, who presents for follow of her neuropathy and memory loss. Her exam is stable and in fact, her memory scores are almost exactly the same as in 7/15, when I last saw her, which is very reassuring. She had a normal  neuropsychological test in 8/14. I reassured her in that regard as well and re-test was encouraged in 2 years at the time. We can certainly pursue repeat neuropsychological testing at this time. Physical exam is stable. She has stable evidence of neuropathy. She had a brain MRI and cervical spine in my in the past. She has a history of degenerative cervical spine disease had surgery to the lower back some years ago. She had done well with physical therapy through her neurosurgeonin the past. Today, we talked about maintaining a healthy lifestyle, staying well hydrated with water, eating nutritious food and staying active physically and mentally. I do not think she needs any other new test or new medication at this time. For sleep, I suggested, she try melatonin. I suggested she try it 3-5 mg 1-2 hours before her projected bedtime. I also advised her regarding good sleep hygiene. At this juncture, I think I can see her back on an as-needed basis or in a year, if she prefers. We will be in touch over the phone once we got her neuropsychological test results from Dr. Valentina Shaggy. She was in agreement.  I spent 25 minutes in total face-to-face time with the patient, more  than 50% of which was spent in counseling and coordination of care, reviewing test results, reviewing medication and discussing or reviewing the diagnosis of memory loss, and PN, the prognosis and treatment options.

## 2015-08-29 ENCOUNTER — Telehealth: Payer: Self-pay

## 2015-08-29 NOTE — Telephone Encounter (Signed)
Faxed signed orders dated 08-28-15 to [facility] for mastectomy supplies.  Sent a copy to HIM to be scanned into patient's EMR.

## 2015-09-22 ENCOUNTER — Telehealth: Payer: Self-pay

## 2015-09-22 NOTE — Telephone Encounter (Signed)
Received fax from [facility] about refill on arimidex. S/w pt about arimidex. She has another #90 bottle yet in the house. Her f/u will be in March. Pt thinks Dr Edwyna Shell wants her to continue past 10 years. Message left for Barbaraann Share to talk with Dr Edwyna Shell on Monday.

## 2015-09-25 ENCOUNTER — Other Ambulatory Visit: Payer: Self-pay

## 2015-09-25 DIAGNOSIS — C50911 Malignant neoplasm of unspecified site of right female breast: Secondary | ICD-10-CM

## 2015-09-25 MED ORDER — ANASTROZOLE 1 MG PO TABS: ORAL_TABLET | ORAL | Status: DC

## 2015-09-26 NOTE — Telephone Encounter (Signed)
Told Ms. Bertelson that refill was sent to pharmacy as Dr. Mariana Kaufman note from 08-24-15 indicates continuing Arimidex past 10 years. Patient verbalized understanding.

## 2015-10-06 DIAGNOSIS — J0100 Acute maxillary sinusitis, unspecified: Secondary | ICD-10-CM | POA: Diagnosis not present

## 2015-10-12 DIAGNOSIS — N390 Urinary tract infection, site not specified: Secondary | ICD-10-CM | POA: Diagnosis not present

## 2015-10-12 DIAGNOSIS — Z124 Encounter for screening for malignant neoplasm of cervix: Secondary | ICD-10-CM | POA: Diagnosis not present

## 2015-10-30 DIAGNOSIS — H401131 Primary open-angle glaucoma, bilateral, mild stage: Secondary | ICD-10-CM | POA: Diagnosis not present

## 2015-11-09 DIAGNOSIS — H6123 Impacted cerumen, bilateral: Secondary | ICD-10-CM | POA: Diagnosis not present

## 2015-11-09 DIAGNOSIS — J329 Chronic sinusitis, unspecified: Secondary | ICD-10-CM | POA: Diagnosis not present

## 2015-11-09 DIAGNOSIS — H109 Unspecified conjunctivitis: Secondary | ICD-10-CM | POA: Diagnosis not present

## 2015-11-09 DIAGNOSIS — H6690 Otitis media, unspecified, unspecified ear: Secondary | ICD-10-CM | POA: Diagnosis not present

## 2015-11-14 ENCOUNTER — Telehealth: Payer: Self-pay | Admitting: Psychology

## 2015-11-14 ENCOUNTER — Ambulatory Visit: Payer: Medicare Other | Admitting: Psychology

## 2015-11-14 NOTE — Telephone Encounter (Signed)
Barbara Rangel cancelled her appointment for neuropsychological evaluation today due to a family emergency.

## 2015-11-30 ENCOUNTER — Telehealth: Payer: Self-pay | Admitting: Oncology

## 2015-11-30 NOTE — Telephone Encounter (Signed)
Called and left a message with 6month follow up °

## 2015-12-22 DIAGNOSIS — J00 Acute nasopharyngitis [common cold]: Secondary | ICD-10-CM | POA: Diagnosis not present

## 2015-12-27 DIAGNOSIS — R0982 Postnasal drip: Secondary | ICD-10-CM | POA: Diagnosis not present

## 2015-12-27 DIAGNOSIS — B370 Candidal stomatitis: Secondary | ICD-10-CM | POA: Diagnosis not present

## 2015-12-27 DIAGNOSIS — J019 Acute sinusitis, unspecified: Secondary | ICD-10-CM | POA: Diagnosis not present

## 2016-01-08 ENCOUNTER — Other Ambulatory Visit: Payer: Self-pay | Admitting: Oncology

## 2016-01-08 ENCOUNTER — Ambulatory Visit
Admission: RE | Admit: 2016-01-08 | Discharge: 2016-01-08 | Disposition: A | Discharge status: Home / Self Care | Payer: Medicare Other | Source: Ambulatory Visit | Attending: Oncology | Admitting: Oncology

## 2016-01-08 DIAGNOSIS — M8589 Other specified disorders of bone density and structure, multiple sites: Secondary | ICD-10-CM | POA: Diagnosis not present

## 2016-01-08 DIAGNOSIS — Z1231 Encounter for screening mammogram for malignant neoplasm of breast: Secondary | ICD-10-CM

## 2016-01-08 DIAGNOSIS — E2839 Other primary ovarian failure: Secondary | ICD-10-CM

## 2016-01-19 ENCOUNTER — Other Ambulatory Visit: Payer: Self-pay | Admitting: *Deleted

## 2016-01-19 DIAGNOSIS — C50911 Malignant neoplasm of unspecified site of right female breast: Secondary | ICD-10-CM

## 2016-01-20 ENCOUNTER — Other Ambulatory Visit: Payer: Self-pay | Admitting: Oncology

## 2016-01-22 ENCOUNTER — Other Ambulatory Visit (HOSPITAL_BASED_OUTPATIENT_CLINIC_OR_DEPARTMENT_OTHER): Payer: Medicare Other

## 2016-01-22 ENCOUNTER — Ambulatory Visit (HOSPITAL_BASED_OUTPATIENT_CLINIC_OR_DEPARTMENT_OTHER): Payer: Medicare Other | Admitting: Oncology

## 2016-01-22 ENCOUNTER — Encounter: Payer: Self-pay | Admitting: Oncology

## 2016-01-22 VITALS — BP 144/68 | HR 78 | Temp 97.9°F | Resp 18 | Ht 65.0 in | Wt 134.3 lb

## 2016-01-22 DIAGNOSIS — M899 Disorder of bone, unspecified: Secondary | ICD-10-CM

## 2016-01-22 DIAGNOSIS — Z853 Personal history of malignant neoplasm of breast: Secondary | ICD-10-CM | POA: Diagnosis not present

## 2016-01-22 DIAGNOSIS — C50911 Malignant neoplasm of unspecified site of right female breast: Secondary | ICD-10-CM

## 2016-01-22 DIAGNOSIS — R922 Inconclusive mammogram: Secondary | ICD-10-CM

## 2016-01-22 DIAGNOSIS — M8580 Other specified disorders of bone density and structure, unspecified site: Secondary | ICD-10-CM

## 2016-01-22 LAB — CBC WITH DIFFERENTIAL/PLATELET
BASO%: 0.5 % (ref 0.0–2.0)
Basophils Absolute: 0 10*3/uL (ref 0.0–0.1)
EOS%: 1.3 % (ref 0.0–7.0)
Eosinophils Absolute: 0.1 10*3/uL (ref 0.0–0.5)
HCT: 45.1 % (ref 34.8–46.6)
HGB: 14.8 g/dL (ref 11.6–15.9)
LYMPH%: 26.3 % (ref 14.0–49.7)
MCH: 31.7 pg (ref 25.1–34.0)
MCHC: 32.9 g/dL (ref 31.5–36.0)
MCV: 96.5 fL (ref 79.5–101.0)
MONO#: 0.4 10*3/uL (ref 0.1–0.9)
MONO%: 8.4 % (ref 0.0–14.0)
NEUT#: 3.1 10*3/uL (ref 1.5–6.5)
NEUT%: 63.5 % (ref 38.4–76.8)
Platelets: 175 10*3/uL (ref 145–400)
RBC: 4.68 10*6/uL (ref 3.70–5.45)
RDW: 13.5 % (ref 11.2–14.5)
WBC: 5 10*3/uL (ref 3.9–10.3)
lymph#: 1.3 10*3/uL (ref 0.9–3.3)

## 2016-01-22 LAB — COMPREHENSIVE METABOLIC PANEL
ALT: 23 U/L (ref 0–55)
AST: 26 U/L (ref 5–34)
Albumin: 3.9 g/dL (ref 3.5–5.0)
Alkaline Phosphatase: 65 U/L (ref 40–150)
Anion Gap: 7 mEq/L (ref 3–11)
BUN: 14.6 mg/dL (ref 7.0–26.0)
CO2: 29 mEq/L (ref 22–29)
Calcium: 9.8 mg/dL (ref 8.4–10.4)
Chloride: 106 mEq/L (ref 98–109)
Creatinine: 0.8 mg/dL (ref 0.6–1.1)
EGFR: 89 mL/min/{1.73_m2} — ABNORMAL LOW (ref >90)
Glucose: 75 mg/dl (ref 70–140)
Potassium: 4 mEq/L (ref 3.5–5.1)
Sodium: 141 mEq/L (ref 136–145)
Total Bilirubin: 0.78 mg/dL (ref 0.20–1.20)
Total Protein: 7.5 g/dL (ref 6.4–8.3)

## 2016-01-22 NOTE — Progress Notes (Signed)
OFFICE PROGRESS NOTE   January 22, 2016   Physicians: Dorian Heckle PCP), Star Age (neurology), Deri Fuelling, Diedre Viola (gyn, [place], [place]), Earle Gell PCP at [place] has changed, new MD not known yet.  INTERVAL HISTORY:  Patient is seen, alone for visit, in scheduled 6 month follow up of adjuvant hormone blocker therapy with Arimidex, used since 11-2004 for multiple node positive right breast cancer. Most recent left tomo mammogram was at [facility] 01-08-16, with heterogeneously dense breast tissue but no other mammographic findings of concern. Bone density scan also at [facility] 01-08-16 had stable osteopenia compared with 12-2014, with lowest T score in femur at -1.3.  Patient notices some mild stiffness in back at times, which does not seem particularly bothersome. She experiences numbness in hands mostly when she wakens at night, has history of carpal tunnel problems in past, no interventions then. She has some lower leg cramps occasionally, did not try drinking sports drink daily which I have suggested again. While the stiffness in back could be related to AI, the other symptoms do not seem obviously AI-type arthralgias.  She had sinus infection x 3 weeks treated with antibiotics by PCP, resolved. She is not aware of any changes in left breast or the right mastectomy area. She denies new or different pain, SOB or other lower respiratory symptoms, any bleeding, other recent infections, any abdominal or pelvic discomfort, any change in bowels, any swelling in LE. Energy and appetite are good. Prior to the prolonged sinus infection she had been exercising regularly, which she plans to resume.  Remainder of 10 point Review of Systems negative.    No central catheter  ONCOLOGIC HISTORY Patient found right breast cancer on breast self exam after negative mammogram same year, diagnosis June 2005 in [place], [place] ER/PR + and Her 2 negative, with patient age 29 then. She  was treated with mastectomy with "9 or 11" node axillary evaluation, of which patient recalls "7 or 9" nodes were involved; she did not have local or axillary radiation. She was treated on NSABP B28 trial with dose dense adriamycin cytoxan followed by taxol, from 11-19-2003 thru 09-21-2004. She has been on adjuvant arimidex since Jan 2006. Last bone density scan was at [facility] 12-29-2012 had normal bone density in both LS and hip, improved compared with 2010 and 2013. Most recent left mamm[facility] was at [facility] 12-29-12, with heterogeneously dense breast tissue but otherwise no mammographic findings of concern. Last breast MRI in this EMR was Feb 2011.    Objective:  Vital signs in last 24 hours:  BP 144/68 mmHg  Pulse 78  Temp(Src) 97.9 F (36.6 C) (Oral)  Resp 18  Ht _0  (1.651 m)  Wt 134 lb 4.8 oz (60.918 kg)  BMI 22.35 kg/m2  SpO2 100% Weight up 3 lbs Alert, oriented and appropriate. Ambulatory without difficulty, easily mobile. Looks comfortable, very pleasant as always.   HEENT:PERRL, sclerae not icteric. Oral mucosa moist without lesions, posterior pharynx clear.  Lymphatics:no cervical,supraclavicular, axillary or inguinal adenopathy Resp: clear to auscultation bilaterally and normal percussion bilaterally Cardio: regular rate and rhythm. No gallop. GI: soft, nontender, not distended, no mass or organomegaly. Normally active bowel sounds. Musculoskeletal/ Extremities: without pitting edema, cords, tenderness Neuro: no peripheral neuropathy. Otherwise nonfocal. PSYCH appropriate mood and affect Skin without rash, ecchymosis, petechiae Breasts: Right mastectomy scar without evidence of local recurrence. Left breast without dominant mass, skin or nipple findings. Axillae benign.  Lab Results:  Results for orders placed  or performed in visit on 01/22/16  CBC with Differential  Result Value Ref Range   WBC 5.0 3.9 - 10.3 10e3/uL   NEUT# 3.1 1.5 - 6.5 10e3/uL   HGB 14.8  11.6 - 15.9 g/dL   HCT 45.1 34.8 - 46.6 %   Platelets 175 145 - 400 10e3/uL   MCV 96.5 79.5 - 101.0 fL   MCH 31.7 25.1 - 34.0 pg   MCHC 32.9 31.5 - 36.0 g/dL   RBC 4.68 3.70 - 5.45 10e6/uL   RDW 13.5 11.2 - 14.5 %   lymph# 1.3 0.9 - 3.3 10e3/uL   MONO# 0.4 0.1 - 0.9 10e3/uL   Eosinophils Absolute 0.1 0.0 - 0.5 10e3/uL   Basophils Absolute 0.0 0.0 - 0.1 10e3/uL   NEUT% 63.5 38.4 - 76.8 %   LYMPH% 26.3 14.0 - 49.7 %   MONO% 8.4 0.0 - 14.0 %   EOS% 1.3 0.0 - 7.0 %   BASO% 0.5 0.0 - 2.0 %  Comprehensive metabolic panel  Result Value Ref Range   Sodium 141 136 - 145 mEq/L   Potassium 4.0 3.5 - 5.1 mEq/L   Chloride 106 98 - 109 mEq/L   CO2 29 22 - 29 mEq/L   Glucose 75 70 - 140 mg/dl   BUN 14.6 7.0 - 26.0 mg/dL   Creatinine 0.8 0.6 - 1.1 mg/dL   Total Bilirubin 0.78 0.20 - 1.20 mg/dL   Alkaline Phosphatase 65 40 - 150 U/L   AST 26 5 - 34 U/L   ALT 23 0 - 55 U/L   Total Protein 7.5 6.4 - 8.3 g/dL   Albumin 3.9 3.5 - 5.0 g/dL   Calcium 9.8 8.4 - 10.4 mg/dL   Anion Gap 7 3 - 11 mEq/L   EGFR 89 (L) >90 ml/min/1.73 m2     Studies/Results: DIGITAL SCREENING UNILATERAL LEFT MAMMOGRAM WITH TOMO AND CAD  01-08-16  COMPARISON: Previous exam(s).  ACR Breast Density Category c: The breast tissue is heterogeneously dense, which may obscure small masses.  FINDINGS: There are no findings suspicious for malignancy. Images were processed with CAD.  IMPRESSION: No mammographic evidence of malignancy. A result letter of this screening mammogram will be mailed directly to the patient.  RECOMMENDATION: Screening mammogram in one year. (Code:SM-B-01Y)  BI-RADS CATEGORY 1: Negative.         EXAM: DUAL X-RAY ABSORPTIOMETRY (DXA) FOR BONE MINERAL DENSITY 01-08-16  PATIENT: Name: Barbara, Rangel Patient ID: [id number] Birth Date: 07-01-1946 Height: 64.7 in. Sex: Female Measured: 01/08/2016 Weight: 133.0 lbs. Indications: Amenorrhea, Breast Cancer History, Estrogen  Deficient, Height Loss (781.91), High risk medication use, History of Fracture (Adult) (V15.51), Omeprazole, Postmenopausal Fractures: Ankle Treatments: Calcium (E943.0), Fosamax, Vitamin D (E933.5)  ASSESSMENT: The BMD measured at Femur Neck Right is 0.852 g/cm2 with a T-score of -1.3. This patient is considered osteopenic according to [facility]) criteria. There has been no statistically significant change in BMD of Lumbar spine or the left hip since prior exam dated 01/03/2015.  Site Region Measured Date Measured Age YA BMD Significant CHANGE T-score DualFemur Neck Right 01/08/2016 68.8 -1.3 0.852 g/cm2  Left Forearm Radius 33% 01/08/2016 68.8 -1.1 0.792 g/cm2  World Health Organization [facility]) criteria for post-menopausal, Caucasian Women: Normal T-score at or above -1 SD Osteopenia T-score between -1 and -2.5 SD Osteoporosis T-score at or below -2.5 SD        Medications: I have reviewed the patient's current medications. She will check her calcium dosage, recommended ~  1200 mg daily with D.  DISCUSSION Prior to visit I discussed length of adjuvant AI with my partner in  Breast oncology. With studies still in progress, extending AI treatment in high risk patients who are tolerating this is frequently done, presently out to at least 10 years. Patient understands that she is "ahead of the studies" in time on AI, but is also doing very well despite multiple node + breast cancer by history available, and tolerating this well. She much prefers to continue the AI at present, with continued close follow up and consideration of length of treatment as we go.   We have reviewed possible side effects of the AI, including decrease in bone density, possible elevated lipids (which are being followed by PCP), and possible arthralgias which resolve within 1-2 weeks of discontinuing or holding the AI.  If joint symptoms seem more bothersome to her, we can  certainly give a 2 week break to see if these are in fact related to the AI.   Assessment/Plan: 1..T2N1 right breast cancer: diagnosed June 2005, features and treatment as above. Clinically doing well on continued arimidex. Yearly left mammogram, tomo best with dense breast tissue. With known low bone density and chronic high risk medication aromatase inhibitor, it is medically necessary to have yearly bone density scan. Fasting lipids by PCP. Return visit 6 months. 2.improvement in pelvic symptoms with pelvic physical therapy  3.hx colon polyps: colonoscopy 10-2014, 5 year follow up recommended. 4.hx recurrent UTIs, no symptoms currently  5.osteopenia, slightly lower since 2015. Resume weight bearing exercise and continue all other interventions to maintain/ improve bone density, which are particularly important given ongoing therapy with high risk aromatase inhibitor. Medically necessary to have bone density scans yearly on the aromatase inhibitor. 6.flu vaccine by PCP this fall 7.plantar fasciitis: improving with interventions by podiatry   All questions answered and patient wrote notes. She knows to call if concerns prior to follow up visit in 6 months. Cc PCP and Dr Gwinda Maine Time spent 25 min including >50% counseling and coordination of care.   Gordy Levan, MD   01/22/2016, 5:17 PM

## 2016-01-23 ENCOUNTER — Other Ambulatory Visit: Payer: Self-pay | Admitting: Oncology

## 2016-01-23 DIAGNOSIS — Z79899 Other long term (current) drug therapy: Secondary | ICD-10-CM

## 2016-01-23 DIAGNOSIS — Z9221 Personal history of antineoplastic chemotherapy: Secondary | ICD-10-CM

## 2016-01-23 DIAGNOSIS — C50911 Malignant neoplasm of unspecified site of right female breast: Secondary | ICD-10-CM

## 2016-01-24 ENCOUNTER — Telehealth: Payer: Self-pay | Admitting: Oncology

## 2016-01-24 NOTE — Telephone Encounter (Signed)
lvm for pt regarding to sept appt... °

## 2016-01-31 DIAGNOSIS — Z0000 Encounter for general adult medical examination without abnormal findings: Secondary | ICD-10-CM | POA: Diagnosis not present

## 2016-01-31 DIAGNOSIS — Z1389 Encounter for screening for other disorder: Secondary | ICD-10-CM | POA: Diagnosis not present

## 2016-01-31 DIAGNOSIS — I10 Essential (primary) hypertension: Secondary | ICD-10-CM | POA: Diagnosis not present

## 2016-01-31 DIAGNOSIS — I779 Disorder of arteries and arterioles, unspecified: Secondary | ICD-10-CM | POA: Diagnosis not present

## 2016-01-31 DIAGNOSIS — C50911 Malignant neoplasm of unspecified site of right female breast: Secondary | ICD-10-CM | POA: Diagnosis not present

## 2016-02-02 DIAGNOSIS — M47812 Spondylosis without myelopathy or radiculopathy, cervical region: Secondary | ICD-10-CM | POA: Diagnosis not present

## 2016-02-02 DIAGNOSIS — S39012A Strain of muscle, fascia and tendon of lower back, initial encounter: Secondary | ICD-10-CM | POA: Diagnosis not present

## 2016-02-02 DIAGNOSIS — M4316 Spondylolisthesis, lumbar region: Secondary | ICD-10-CM | POA: Diagnosis not present

## 2016-02-02 DIAGNOSIS — H401131 Primary open-angle glaucoma, bilateral, mild stage: Secondary | ICD-10-CM | POA: Diagnosis not present

## 2016-02-02 DIAGNOSIS — S161XXA Strain of muscle, fascia and tendon at neck level, initial encounter: Secondary | ICD-10-CM | POA: Diagnosis not present

## 2016-02-02 DIAGNOSIS — M47816 Spondylosis without myelopathy or radiculopathy, lumbar region: Secondary | ICD-10-CM | POA: Diagnosis not present

## 2016-02-02 DIAGNOSIS — M47814 Spondylosis without myelopathy or radiculopathy, thoracic region: Secondary | ICD-10-CM | POA: Diagnosis not present

## 2016-02-14 ENCOUNTER — Emergency Department (HOSPITAL_COMMUNITY): Payer: Medicare Other

## 2016-02-14 ENCOUNTER — Encounter (HOSPITAL_COMMUNITY): Payer: Self-pay | Admitting: Emergency Medicine

## 2016-02-14 ENCOUNTER — Emergency Department (HOSPITAL_COMMUNITY)
Admission: EM | Admit: 2016-02-14 | Discharge: 2016-02-14 | Disposition: A | Discharge status: Home / Self Care | Payer: Medicare Other | Source: Intra-hospital | Attending: Emergency Medicine | Admitting: Emergency Medicine

## 2016-02-14 DIAGNOSIS — R4182 Altered mental status, unspecified: Secondary | ICD-10-CM | POA: Diagnosis present

## 2016-02-14 DIAGNOSIS — Z853 Personal history of malignant neoplasm of breast: Secondary | ICD-10-CM | POA: Insufficient documentation

## 2016-02-14 DIAGNOSIS — H409 Unspecified glaucoma: Secondary | ICD-10-CM | POA: Diagnosis not present

## 2016-02-14 DIAGNOSIS — I10 Essential (primary) hypertension: Secondary | ICD-10-CM | POA: Diagnosis not present

## 2016-02-14 DIAGNOSIS — R413 Other amnesia: Secondary | ICD-10-CM

## 2016-02-14 DIAGNOSIS — Z7982 Long term (current) use of aspirin: Secondary | ICD-10-CM | POA: Diagnosis not present

## 2016-02-14 DIAGNOSIS — G3184 Mild cognitive impairment, so stated: Secondary | ICD-10-CM | POA: Insufficient documentation

## 2016-02-14 DIAGNOSIS — Z87440 Personal history of urinary (tract) infections: Secondary | ICD-10-CM | POA: Diagnosis not present

## 2016-02-14 DIAGNOSIS — R29810 Facial weakness: Secondary | ICD-10-CM | POA: Insufficient documentation

## 2016-02-14 DIAGNOSIS — M1990 Unspecified osteoarthritis, unspecified site: Secondary | ICD-10-CM | POA: Diagnosis not present

## 2016-02-14 DIAGNOSIS — K5900 Constipation, unspecified: Secondary | ICD-10-CM | POA: Diagnosis not present

## 2016-02-14 DIAGNOSIS — T4145XA Adverse effect of unspecified anesthetic, initial encounter: Secondary | ICD-10-CM | POA: Diagnosis not present

## 2016-02-14 DIAGNOSIS — G8929 Other chronic pain: Secondary | ICD-10-CM | POA: Insufficient documentation

## 2016-02-14 DIAGNOSIS — Z79899 Other long term (current) drug therapy: Secondary | ICD-10-CM | POA: Insufficient documentation

## 2016-02-14 DIAGNOSIS — R402411 Glasgow coma scale score 13-15, in the field [EMT or ambulance]: Secondary | ICD-10-CM | POA: Diagnosis not present

## 2016-02-14 LAB — I-STAT CHEM 8, ED
BUN: 20 mg/dL (ref 6–20)
Calcium, Ion: 1.2 mmol/L (ref 1.13–1.30)
Chloride: 108 mmol/L (ref 101–111)
Creatinine, Ser: 0.7 mg/dL (ref 0.44–1.00)
Glucose, Bld: 105 mg/dL — ABNORMAL HIGH (ref 65–99)
HCT: 51 % — ABNORMAL HIGH (ref 36.0–46.0)
Hemoglobin: 17.3 g/dL — ABNORMAL HIGH (ref 12.0–15.0)
Potassium: 3.6 mmol/L (ref 3.5–5.1)
Sodium: 146 mmol/L — ABNORMAL HIGH (ref 135–145)
TCO2: 23 mmol/L (ref 0–100)

## 2016-02-14 LAB — CBC
HCT: 46.3 % — ABNORMAL HIGH (ref 36.0–46.0)
Hemoglobin: 15.7 g/dL — ABNORMAL HIGH (ref 12.0–15.0)
MCH: 32 pg (ref 26.0–34.0)
MCHC: 33.9 g/dL (ref 30.0–36.0)
MCV: 94.5 fL (ref 78.0–100.0)
Platelets: 192 10*3/uL (ref 150–400)
RBC: 4.9 MIL/uL (ref 3.87–5.11)
RDW: 13.5 % (ref 11.5–15.5)
WBC: 7.5 10*3/uL (ref 4.0–10.5)

## 2016-02-14 LAB — URINALYSIS, ROUTINE W REFLEX MICROSCOPIC
Bilirubin Urine: NEGATIVE
Glucose, UA: NEGATIVE mg/dL
Hgb urine dipstick: NEGATIVE
Ketones, ur: NEGATIVE mg/dL
Nitrite: NEGATIVE
Protein, ur: NEGATIVE mg/dL
Specific Gravity, Urine: 1.007 (ref 1.005–1.030)
pH: 7.5 (ref 5.0–8.0)

## 2016-02-14 LAB — COMPREHENSIVE METABOLIC PANEL
ALT: 31 U/L (ref 14–54)
AST: 36 U/L (ref 15–41)
Albumin: 4.4 g/dL (ref 3.5–5.0)
Alkaline Phosphatase: 63 U/L (ref 38–126)
Anion gap: 14 (ref 5–15)
BUN: 16 mg/dL (ref 6–20)
CO2: 21 mmol/L — ABNORMAL LOW (ref 22–32)
Calcium: 10.7 mg/dL — ABNORMAL HIGH (ref 8.9–10.3)
Chloride: 109 mmol/L (ref 101–111)
Creatinine, Ser: 0.83 mg/dL (ref 0.44–1.00)
GFR calc Af Amer: >60 mL/min (ref >60)
GFR calc non Af Amer: >60 mL/min (ref >60)
Glucose, Bld: 107 mg/dL — ABNORMAL HIGH (ref 65–99)
Potassium: 3.7 mmol/L (ref 3.5–5.1)
Sodium: 144 mmol/L (ref 135–145)
Total Bilirubin: 0.8 mg/dL (ref 0.3–1.2)
Total Protein: 7.9 g/dL (ref 6.5–8.1)

## 2016-02-14 LAB — DIFFERENTIAL
Basophils Absolute: 0 10*3/uL (ref 0.0–0.1)
Basophils Relative: 0 %
Eosinophils Absolute: 0 10*3/uL (ref 0.0–0.7)
Eosinophils Relative: 0 %
Lymphocytes Relative: 23 %
Lymphs Abs: 1.8 10*3/uL (ref 0.7–4.0)
Monocytes Absolute: 0.3 10*3/uL (ref 0.1–1.0)
Monocytes Relative: 4 %
Neutro Abs: 5.4 10*3/uL (ref 1.7–7.7)
Neutrophils Relative %: 73 %

## 2016-02-14 LAB — APTT: aPTT: 26 seconds (ref 24–37)

## 2016-02-14 LAB — RAPID URINE DRUG SCREEN, HOSP PERFORMED
Amphetamines: NOT DETECTED
Barbiturates: NOT DETECTED
Benzodiazepines: NOT DETECTED
Cocaine: NOT DETECTED
Opiates: NOT DETECTED
Tetrahydrocannabinol: NOT DETECTED

## 2016-02-14 LAB — I-STAT TROPONIN, ED: Troponin i, poc: 0 ng/mL (ref 0.00–0.08)

## 2016-02-14 LAB — ETHANOL: Alcohol, Ethyl (B): <5 mg/dL (ref <5)

## 2016-02-14 LAB — URINE MICROSCOPIC-ADD ON

## 2016-02-14 LAB — PROTIME-INR
INR: 1.01 (ref 0.00–1.49)
Prothrombin Time: 13.5 seconds (ref 11.6–15.2)

## 2016-02-14 MED ORDER — GADOBENATE DIMEGLUMINE 529 MG/ML IV SOLN
12.0000 mL | Freq: Once | INTRAVENOUS | Status: AC | PRN
  Administered 2016-02-14: 12 mL via INTRAVENOUS

## 2016-02-14 NOTE — Code Documentation (Signed)
70yo female arriving to [facility] via [place] at 1134.  Patient was at the dentist office for a procedure.  EMS reports that she received laughing gas and a numbing agent and then passed out and was noted to be confused.  EMS assessed left facial droop and confusion and reports patient was repeating herself and activated a code stroke.  Stroke team at the bedside on patient arrival.  Labs drawn, patient cleared by EDP and patient to CT with team. CT completed.  NIHSS 4, see documentation for details and code stroke times.  Patient unable to answer her age or the month and has left facial droop on exam.  Dr. Silverio Decamp to the bedside.  No acute stroke treatment at this time.  Code stroke canceled.  Bedside handoff with ED RN Margreta Journey.

## 2016-02-14 NOTE — ED Provider Notes (Signed)
Pt back to baseline She is in no distress No facial droop No arm drift MRI is negative Pt will be discharged home She will hold her evening meds then resume tomorrow Discussed return precautions with patient/family   Ripley Fraise, MD 02/14/16 1736

## 2016-02-14 NOTE — ED Notes (Signed)
Pt ambulated in hallway independently of nurse. No issues.

## 2016-02-14 NOTE — ED Notes (Signed)
Per Neurologist, q2H neurochecks. To cancel code stroke

## 2016-02-14 NOTE — ED Provider Notes (Signed)
CSN: [id number]     Arrival date & time 02/14/16  1134 History   None    Chief Complaint  Patient presents with  . Code Stroke    @EDPCLEARED @  HPI Level V caveat due to altered mental status. Patient was brought in as a code stroke. She was reportedly having dental work done and was under anesthesia with reported laughing gas. Reportedly became less responsive and began to shake. Palpation up and was confused and may have had difficulty moving the left side. There is left facial droop which she had had an injection on that side. Patient's had anesthesia in the past and had difficulty waking up. Patient is still little confused and cannot provide a whole lot history.   Past Medical History  Diagnosis Date  . PONV (postoperative nausea and vomiting)   . Other and unspecified general anesthetics causing adverse effect in therapeutic use     hard to wake up  . Chronic back pain     right radicular leg pain  . Constipation     r/t pain meds and takes Dulcolax nightly  . FHx: colonic polyps     hx of and mother had colon cancer  . Chronic urinary tract infection     takes Macrodantin and Cranberry daily  . Glaucoma     uses Xalantan nightly  . Cancer ([place])     hx breast cancer-right  . Hypertension     takes Amlodipine daily  . Arthritis     degenerative arthritis   Past Surgical History  Procedure Laterality Date  . Tubal ligation  1980  . Eye surgery  2004    right eye-detached retina  . Cataract surgery  2005    right eye  . Right breast biopsy  2005  . Breast lumpectomy      x2  . Left breast biopsy  2009  . Ankle surgery  2011    left ankle with screws and plates  . Colonoscopy    . Retinal detachment surgery  2004  . Mastectomy  2005    right  d/t breast cancer;no sticks to right arm LYMPH NODES REMOVED.  . Lumbar laminectomy/decompression microdiscectomy  10/31/2011    Procedure: LUMBAR LAMINECTOMY/DECOMPRESSION MICRODISCECTOMY;  Surgeon: Dahlia Bailiff;   Location: [place];  Service: Orthopedics;  Laterality: Right;  L4-5 Decompression with Right Facet Decompression   . Colonoscopy with propofol N/A 10/11/2014    Procedure: COLONOSCOPY WITH PROPOFOL;  Surgeon: Garlan Fair, MD;  Location: [facility];  Service: Endoscopy;  Laterality: N/A;   Family History  Problem Relation Age of Onset  . Anesthesia problems Neg Hx   . Hypotension Neg Hx   . Malignant hyperthermia Neg Hx   . Pseudochol deficiency Neg Hx   . Colon cancer Mother    Social History  Substance Use Topics  . Smoking status: Never Smoker   . Smokeless tobacco: Never Used  . Alcohol Use: No   OB History    No data available     Review of Systems  Unable to perform ROS: Mental status change      Allergies  Avelox; Tramadol; Trimethoprim; Cephalexin; Citalopram hydrobromide; Quinolones; and Cephalexin  Home Medications   Prior to Admission medications   Medication Sig Start Date End Date Taking? Authorizing Provider  amLODipine (NORVASC) 5 MG tablet Take 1 tablet (5 mg total) by mouth daily. Take in evening or at bedtime Patient taking differently: Take 5 mg by mouth at bedtime.  09/16/12  Yes Carlena Sax, MD  anastrozole (ARIMIDEX) 1 MG tablet Take 1 tablet by mouth  daily 09/25/15  Yes Lennis Marion Downer, MD  Cranberry Extract 250 MG TABS Take 1 capsule by mouth at bedtime.    Yes Historical Provider, MD  acetaminophen (TYLENOL) 500 MG tablet Take 1,000 mg by mouth at bedtime.    Historical Provider, MD  alendronate (FOSAMAX) 70 MG tablet Take 1 tablet (70 mg total) by mouth every 7 (seven) days. Hold while in hospital. Thursday.  Take with a full glass of water on an empty stomach. 11/01/11   Nira Retort, MD  aspirin EC 81 MG tablet Take 81 mg by mouth every morning.     Historical Provider, MD  atorvastatin (LIPITOR) 20 MG tablet Take 1 tablet by mouth every morning. 04/21/13   Historical Provider, MD  calcium citrate-vitamin D (CITRACAL+D) 315-200 MG-UNIT  per tablet Take 2 tablets by mouth 2 (two) times daily.     Historical Provider, MD  DIPHENHYDRAMINE HCL PO Take 25 mg by mouth at bedtime as needed.     Historical Provider, MD  docusate sodium (COLACE) 100 MG capsule Take 100 mg by mouth 2 (two) times daily.      Historical Provider, MD  latanoprost (XALATAN) 0.005 % ophthalmic solution Place 1 drop into both eyes at bedtime.      Historical Provider, MD  loratadine (CLARITIN REDITABS) 10 MG dissolvable tablet Take 10 mg by mouth daily as needed for allergies. For allergies    Historical Provider, MD  Magnesium 250 MG TABS Take 250 mg by mouth at bedtime.     Historical Provider, MD  Multiple Vitamins-Minerals (MULTIVITAMINS THER. W/MINERALS) TABS Take 1 tablet by mouth every morning.     Historical Provider, MD  nitrofurantoin (MACRODANTIN) 50 MG capsule Take 50 mg by mouth 2 (two) times daily.    Historical Provider, MD  omeprazole (PRILOSEC) 40 MG capsule Take 40 mg by mouth every morning.     Historical Provider, MD   BP 154/76 mmHg  Pulse 96  Temp(Src) 98.1 F (36.7 C) (Oral)  Resp 14  Wt 138 lb 14.2 oz (63 kg)  SpO2 100% Physical Exam  Constitutional: She appears well-developed.  Eyes: EOM are normal.  Neck: Neck supple.  Cardiovascular: Normal rate.   Pulmonary/Chest: Effort normal.  Abdominal: Soft. There is no tenderness.  Musculoskeletal: She exhibits no edema.  Neurological: She is alert.  Patient is alert and somewhat confused. Repeated questions. Left-sided lower facial droop. Good grip strength bilaterally. Moves all extremities.  Skin: Skin is warm.    ED Course  Procedures (including critical care time) Labs Review Labs Reviewed  CBC - Abnormal; Notable for the following:    Hemoglobin 15.7 (*)    HCT 46.3 (*)    All other components within normal limits  COMPREHENSIVE METABOLIC PANEL - Abnormal; Notable for the following:    CO2 21 (*)    Glucose, Bld 107 (*)    Calcium 10.7 (*)    All other components  within normal limits  URINALYSIS, ROUTINE W REFLEX MICROSCOPIC (NOT AT [facility]) - Abnormal; Notable for the following:    Leukocytes, UA TRACE (*)    All other components within normal limits  URINE MICROSCOPIC-ADD ON - Abnormal; Notable for the following:    Squamous Epithelial / LPF 0-5 (*)    Bacteria, UA RARE (*)    All other components within normal limits  I-STAT CHEM 8, ED - Abnormal; Notable  for the following:    Sodium 146 (*)    Glucose, Bld 105 (*)    Hemoglobin 17.3 (*)    HCT 51.0 (*)    All other components within normal limits  ETHANOL  PROTIME-INR  APTT  DIFFERENTIAL  URINE RAPID DRUG SCREEN, HOSP PERFORMED  I-STAT TROPOININ, ED    Imaging Review Ct Head Wo Contrast  02/14/2016  CLINICAL DATA:  Left side weakness & facial numbnes and left side facial droop. Patient was at the dentist, it happened after at the receiving laughing gas and numbing medicine at the dentist office on that side. history of breast CA and hypertension EXAM: CT HEAD WITHOUT CONTRAST TECHNIQUE: Contiguous axial images were obtained from the base of the skull through the vertex without intravenous contrast. COMPARISON:  09/15/2012 FINDINGS: Brain: No evidence of acute infarction, hemorrhage, extra-axial collection, ventriculomegaly, or mass effect. Vascular: No hyperdense vessel or unexpected calcification. Atherosclerotic and physiologic intracranial calcifications. Skull: Negative for fracture or focal lesion. Sinuses/Orbits: No acute findings. Other: None. IMPRESSION: 1. Negative for bleed or other acute intracranial process. Critical Value/emergent results were called by telephone at the time of interpretation on 02/14/2016 at 11:53 am to Dr. Silverio Decamp, who verbally acknowledged these results. Electronically Signed   By: Lucrezia Europe M.D.   On: 02/14/2016 11:54   I have personally reviewed and evaluated these images and lab results as part of my medical decision-making.   EKG Interpretation   Date/Time:   Wednesday February 14 2016 11:52:40 EDT Ventricular Rate:  119 PR Interval:  158 QRS Duration: 96 QT Interval:  316 QTC Calculation: 445 R Axis:   64 Text Interpretation:  Sinus tachycardia Premature ventricular complexes  LAE, consider biatrial enlargement Confirmed by Alvino Chapel  MD, Ovid Curd  [phone]) on 02/14/2016 12:16:57 PM      MDM   Final diagnoses:  Memory impairment    Patient with altered mental status after sedation for dental surgery. Has had some issues with memory but that is coming around. Seen by neurology and code stroke was canceled. MRI will be done due to history of breast cancer.    Davonna Belling, MD 02/14/16 [phone]

## 2016-02-14 NOTE — Discharge Instructions (Signed)

## 2016-02-14 NOTE — ED Notes (Signed)
Called [facility] to cancel code stroke  

## 2016-02-14 NOTE — ED Notes (Signed)
Per [facility], pt was at the dentist, giving laughing gas and numbed L side of face. Pt lost consciousness, pt became confused. Pt is not oriented, asking repetitive questions. Pt does not remember losing consciousness.

## 2016-02-19 DIAGNOSIS — M25512 Pain in left shoulder: Secondary | ICD-10-CM | POA: Diagnosis not present

## 2016-02-19 DIAGNOSIS — M545 Low back pain: Secondary | ICD-10-CM | POA: Diagnosis not present

## 2016-02-20 ENCOUNTER — Emergency Department (HOSPITAL_COMMUNITY)
Admission: EM | Admit: 2016-02-20 | Discharge: 2016-02-20 | Disposition: A | Discharge status: Home / Self Care | Payer: Medicare Other | Attending: Emergency Medicine | Admitting: Emergency Medicine

## 2016-02-20 ENCOUNTER — Encounter (HOSPITAL_COMMUNITY): Payer: Self-pay | Admitting: Emergency Medicine

## 2016-02-20 DIAGNOSIS — K5900 Constipation, unspecified: Secondary | ICD-10-CM | POA: Insufficient documentation

## 2016-02-20 DIAGNOSIS — M79602 Pain in left arm: Secondary | ICD-10-CM | POA: Insufficient documentation

## 2016-02-20 DIAGNOSIS — M1990 Unspecified osteoarthritis, unspecified site: Secondary | ICD-10-CM | POA: Diagnosis not present

## 2016-02-20 DIAGNOSIS — G8929 Other chronic pain: Secondary | ICD-10-CM | POA: Diagnosis not present

## 2016-02-20 DIAGNOSIS — R251 Tremor, unspecified: Secondary | ICD-10-CM | POA: Insufficient documentation

## 2016-02-20 DIAGNOSIS — H409 Unspecified glaucoma: Secondary | ICD-10-CM | POA: Insufficient documentation

## 2016-02-20 DIAGNOSIS — F419 Anxiety disorder, unspecified: Secondary | ICD-10-CM | POA: Diagnosis not present

## 2016-02-20 DIAGNOSIS — R531 Weakness: Secondary | ICD-10-CM | POA: Diagnosis present

## 2016-02-20 DIAGNOSIS — Z7982 Long term (current) use of aspirin: Secondary | ICD-10-CM | POA: Diagnosis not present

## 2016-02-20 DIAGNOSIS — Z853 Personal history of malignant neoplasm of breast: Secondary | ICD-10-CM | POA: Insufficient documentation

## 2016-02-20 DIAGNOSIS — Z79899 Other long term (current) drug therapy: Secondary | ICD-10-CM | POA: Insufficient documentation

## 2016-02-20 DIAGNOSIS — Z87440 Personal history of urinary (tract) infections: Secondary | ICD-10-CM | POA: Insufficient documentation

## 2016-02-20 DIAGNOSIS — I10 Essential (primary) hypertension: Secondary | ICD-10-CM | POA: Insufficient documentation

## 2016-02-20 DIAGNOSIS — R404 Transient alteration of awareness: Secondary | ICD-10-CM | POA: Diagnosis not present

## 2016-02-20 LAB — URINALYSIS, ROUTINE W REFLEX MICROSCOPIC
Bilirubin Urine: NEGATIVE
Glucose, UA: NEGATIVE mg/dL
Ketones, ur: NEGATIVE mg/dL
Nitrite: NEGATIVE
Protein, ur: NEGATIVE mg/dL
Specific Gravity, Urine: 1.007 (ref 1.005–1.030)
pH: 7 (ref 5.0–8.0)

## 2016-02-20 LAB — BASIC METABOLIC PANEL
Anion gap: 16 — ABNORMAL HIGH (ref 5–15)
BUN: 10 mg/dL (ref 6–20)
CO2: 21 mmol/L — ABNORMAL LOW (ref 22–32)
Calcium: 10.5 mg/dL — ABNORMAL HIGH (ref 8.9–10.3)
Chloride: 105 mmol/L (ref 101–111)
Creatinine, Ser: 0.74 mg/dL (ref 0.44–1.00)
GFR calc Af Amer: >60 mL/min (ref >60)
GFR calc non Af Amer: >60 mL/min (ref >60)
Glucose, Bld: 108 mg/dL — ABNORMAL HIGH (ref 65–99)
Potassium: 3.6 mmol/L (ref 3.5–5.1)
Sodium: 142 mmol/L (ref 135–145)

## 2016-02-20 LAB — CBC
HCT: 46.5 % — ABNORMAL HIGH (ref 36.0–46.0)
Hemoglobin: 15.7 g/dL — ABNORMAL HIGH (ref 12.0–15.0)
MCH: 31.7 pg (ref 26.0–34.0)
MCHC: 33.8 g/dL (ref 30.0–36.0)
MCV: 93.8 fL (ref 78.0–100.0)
Platelets: 200 10*3/uL (ref 150–400)
RBC: 4.96 MIL/uL (ref 3.87–5.11)
RDW: 13.6 % (ref 11.5–15.5)
WBC: 5.5 10*3/uL (ref 4.0–10.5)

## 2016-02-20 LAB — TROPONIN I: Troponin I: <0.03 ng/mL (ref <0.031)

## 2016-02-20 LAB — URINE MICROSCOPIC-ADD ON

## 2016-02-20 MED ORDER — LORAZEPAM 2 MG/ML IJ SOLN
1.0000 mg | Freq: Once | INTRAMUSCULAR | Status: AC
  Administered 2016-02-20: 1 mg via INTRAVENOUS
  Filled 2016-02-20: qty 1

## 2016-02-20 MED ORDER — SODIUM CHLORIDE 0.9 % IV BOLUS (SEPSIS)
1000.0000 mL | Freq: Once | INTRAVENOUS | Status: AC
  Administered 2016-02-20: 1000 mL via INTRAVENOUS

## 2016-02-20 MED ORDER — SODIUM CHLORIDE 0.9 % IV BOLUS (SEPSIS): 1000.0000 mL | Freq: Once | INTRAVENOUS | Status: DC

## 2016-02-20 NOTE — ED Provider Notes (Signed)
CSN: [id number]     Arrival date & time 02/20/16  1313 History   First MD Initiated Contact with Patient 02/20/16 1314     Chief Complaint  Patient presents with  . Weakness     (Consider location/radiation/quality/duration/timing/severity/associated sxs/prior Treatment) HPI  Pt presenting with c/o generalized weakness and feeling very jittery and tremulous.  She feels very anxious- states it feels like she is not able to calm down.  Also c/o increased urination.  No dysuria.  No abdominal pain or chest pain.  States she felt some pain in her left arm that has been constant since last night which was worrying her.  She is concerned about recent ED visit- she had nitrous oxide/laughing gas for dental extractions- however she had a reaction to this and was thought initially to be having a stroke- workup in the ED was negative including MRI- she states she is continuing to be concerned that there is laughing gas in her system.  No fever/chills.  There are no other associated systemic symptoms, there are no other alleviating or modifying factors.   Past Medical History  Diagnosis Date  . PONV (postoperative nausea and vomiting)   . Other and unspecified general anesthetics causing adverse effect in therapeutic use     hard to wake up  . Chronic back pain     right radicular leg pain  . Constipation     r/t pain meds and takes Dulcolax nightly  . FHx: colonic polyps     hx of and mother had colon cancer  . Chronic urinary tract infection     takes Macrodantin and Cranberry daily  . Glaucoma     uses Xalantan nightly  . Cancer ([place])     hx breast cancer-right  . Hypertension     takes Amlodipine daily  . Arthritis     degenerative arthritis   Past Surgical History  Procedure Laterality Date  . Tubal ligation  1980  . Eye surgery  2004    right eye-detached retina  . Cataract surgery  2005    right eye  . Right breast biopsy  2005  . Breast lumpectomy      x2  . Left breast  biopsy  2009  . Ankle surgery  2011    left ankle with screws and plates  . Colonoscopy    . Retinal detachment surgery  2004  . Mastectomy  2005    right  d/t breast cancer;no sticks to right arm LYMPH NODES REMOVED.  . Lumbar laminectomy/decompression microdiscectomy  10/31/2011    Procedure: LUMBAR LAMINECTOMY/DECOMPRESSION MICRODISCECTOMY;  Surgeon: Dahlia Bailiff;  Location: [place];  Service: Orthopedics;  Laterality: Right;  L4-5 Decompression with Right Facet Decompression   . Colonoscopy with propofol N/A 10/11/2014    Procedure: COLONOSCOPY WITH PROPOFOL;  Surgeon: Garlan Fair, MD;  Location: [facility];  Service: Endoscopy;  Laterality: N/A;   Family History  Problem Relation Age of Onset  . Anesthesia problems Neg Hx   . Hypotension Neg Hx   . Malignant hyperthermia Neg Hx   . Pseudochol deficiency Neg Hx   . Colon cancer Mother    Social History  Substance Use Topics  . Smoking status: Never Smoker   . Smokeless tobacco: Never Used  . Alcohol Use: No   OB History    No data available     Review of Systems  ROS reviewed and all otherwise negative except for mentioned in HPI    Allergies  Avelox; Tramadol; Trimethoprim; Cephalexin; Citalopram hydrobromide; Quinolones; and Cephalexin  Home Medications   Prior to Admission medications   Medication Sig Start Date End Date Taking? Authorizing Provider  acetaminophen (TYLENOL) 500 MG tablet Take 1,000 mg by mouth at bedtime.   Yes Historical Provider, MD  alendronate (FOSAMAX) 70 MG tablet Take 1 tablet (70 mg total) by mouth every 7 (seven) days. Hold while in hospital. Thursday.  Take with a full glass of water on an empty stomach. 11/01/11  Yes Lauretta I Odogwu, MD  amLODipine (NORVASC) 5 MG tablet Take 1 tablet (5 mg total) by mouth daily. Take in evening or at bedtime Patient taking differently: Take 5 mg by mouth at bedtime.  09/16/12  Yes Carlena Sax, MD  anastrozole (ARIMIDEX) 1 MG tablet Take 1 tablet  by mouth  daily 09/25/15  Yes Lennis Marion Downer, MD  aspirin EC 81 MG tablet Take 81 mg by mouth every morning.    Yes Historical Provider, MD  atorvastatin (LIPITOR) 20 MG tablet Take 1 tablet by mouth every morning. 04/21/13  Yes Historical Provider, MD  calcium citrate-vitamin D (CITRACAL+D) 315-200 MG-UNIT per tablet Take 2 tablets by mouth 2 (two) times daily.    Yes Historical Provider, MD  Cranberry Extract 250 MG TABS Take 1 capsule by mouth at bedtime.    Yes Historical Provider, MD  DIPHENHYDRAMINE HCL PO Take 25 mg by mouth at bedtime as needed.    Yes Historical Provider, MD  docusate sodium (COLACE) 100 MG capsule Take 100 mg by mouth 2 (two) times daily.     Yes Historical Provider, MD  latanoprost (XALATAN) 0.005 % ophthalmic solution Place 1 drop into both eyes at bedtime.     Yes Historical Provider, MD  Multiple Vitamins-Minerals (MULTIVITAMINS THER. W/MINERALS) TABS Take 1 tablet by mouth every morning.    Yes Historical Provider, MD  omeprazole (PRILOSEC) 40 MG capsule Take 40 mg by mouth every morning.    Yes Historical Provider, MD  loratadine (CLARITIN REDITABS) 10 MG dissolvable tablet Take 10 mg by mouth daily as needed for allergies. For allergies    Historical Provider, MD  Magnesium 250 MG TABS Take 250 mg by mouth at bedtime.     Historical Provider, MD  nitrofurantoin (MACRODANTIN) 50 MG capsule Take 50 mg by mouth 2 (two) times daily.    Historical Provider, MD   BP 128/57 mmHg  Pulse 83  Temp(Src) 98.1 F (36.7 C) (Rectal)  Resp 17  Ht 5\' 5"  (1.651 m)  Wt 60.328 kg  BMI 22.13 kg/m2  SpO2 100%  Vitals reviewed Physical Exam  Physical Examination: General appearance - alert,  anxious appearing, and in no distress Mental status - alert, oriented to person, place, and time Eyes - pupils equal and reactive, extraocular eye movements intact Mouth - mucous membranes moist, pharynx normal without lesions Chest - clear to auscultation, no wheezes, rales or rhonchi,  symmetric air entry Heart - normal rate, regular rhythm, normal S1, S2, no murmurs, rubs, clicks or gallops Abdomen - soft, nontender, nondistended, no masses or organomegaly Neurological - alert, oriented, normal speech, jittery and tremulous, nonfocal, no cranial nerve defect Extremities - peripheral pulses normal, no pedal edema, no clubbing or cyanosis Skin - normal coloration and turgor, no rashes  ED Course  Procedures (including critical care time) Labs Review Labs Reviewed  BASIC METABOLIC PANEL - Abnormal; Notable for the following:    CO2 21 (*)    Glucose, Bld 108 (*)  Calcium 10.5 (*)    Anion gap 16 (*)    All other components within normal limits  CBC - Abnormal; Notable for the following:    Hemoglobin 15.7 (*)    HCT 46.5 (*)    All other components within normal limits  URINALYSIS, ROUTINE W REFLEX MICROSCOPIC (NOT AT [facility]) - Abnormal; Notable for the following:    Hgb urine dipstick TRACE (*)    Leukocytes, UA LARGE (*)    All other components within normal limits  URINE MICROSCOPIC-ADD ON - Abnormal; Notable for the following:    Squamous Epithelial / LPF 0-5 (*)    Bacteria, UA RARE (*)    All other components within normal limits  TROPONIN I    Imaging Review No results found. I have personally reviewed and evaluated these images and lab results as part of my medical decision-making.   EKG Interpretation   Date/Time:  Tuesday February 20 2016 13:28:45 EDT Ventricular Rate:  101 PR Interval:    QRS Duration: 83 QT Interval:  353 QTC Calculation: 457 R Axis:   44 Text Interpretation:  sinus tachycardia Multiple ventricular premature  complexes baseline artifact Confirmed by Canary Brim  MD, Cleland Simkins [phone]) on  02/20/2016 2:53:28 PM      MDM   Final diagnoses:  Generalized weakness  Anxiety    Pt presenting with c/o generalized weakness as well as anxiety/jitteriness.  On exam pt has nonfocal neuro exam, she appears very anxious.  Workup reassuring  including negative troponin and labs, EKg reassuring.  Pt feels much improved after ativan.  Discharged with strict return precautions.  Pt agreeable with plan.    Alfonzo Beers, MD 02/21/16 1459

## 2016-02-20 NOTE — ED Notes (Addendum)
Pt presents via EMS c/o generalized weakness, L arm pain and states she "feels like she can't settle down". Pt reports increased urination, denies N/V/D. Pt appears very anxious.

## 2016-02-20 NOTE — Discharge Instructions (Signed)
Return to the ED with any concerns including fainting, chest pain, difficulty breathing, weakness of arms or legs, changes in vision or speech, decreased level of alertness/lethargy, or any other alarming symptoms

## 2016-02-21 DIAGNOSIS — R531 Weakness: Secondary | ICD-10-CM | POA: Diagnosis not present

## 2016-02-21 DIAGNOSIS — R0789 Other chest pain: Secondary | ICD-10-CM | POA: Diagnosis not present

## 2016-02-21 DIAGNOSIS — F419 Anxiety disorder, unspecified: Secondary | ICD-10-CM | POA: Diagnosis not present

## 2016-02-21 DIAGNOSIS — I779 Disorder of arteries and arterioles, unspecified: Secondary | ICD-10-CM | POA: Diagnosis not present

## 2016-02-29 DIAGNOSIS — M150 Primary generalized (osteo)arthritis: Secondary | ICD-10-CM | POA: Diagnosis not present

## 2016-02-29 DIAGNOSIS — R202 Paresthesia of skin: Secondary | ICD-10-CM | POA: Diagnosis not present

## 2016-03-01 ENCOUNTER — Encounter: Payer: Self-pay | Admitting: Cardiology

## 2016-03-01 ENCOUNTER — Emergency Department (HOSPITAL_COMMUNITY)
Admission: EM | Admit: 2016-03-01 | Discharge: 2016-03-02 | Disposition: A | Discharge status: Home / Self Care | Payer: Medicare Other | Attending: Emergency Medicine | Admitting: Emergency Medicine

## 2016-03-01 ENCOUNTER — Encounter (HOSPITAL_COMMUNITY): Payer: Self-pay

## 2016-03-01 ENCOUNTER — Telehealth: Payer: Self-pay | Admitting: Neurology

## 2016-03-01 ENCOUNTER — Ambulatory Visit (INDEPENDENT_AMBULATORY_CARE_PROVIDER_SITE_OTHER): Payer: Medicare Other | Admitting: Cardiology

## 2016-03-01 VITALS — BP 140/84 | HR 94 | Ht 65.0 in | Wt 130.6 lb

## 2016-03-01 DIAGNOSIS — Z7982 Long term (current) use of aspirin: Secondary | ICD-10-CM | POA: Diagnosis not present

## 2016-03-01 DIAGNOSIS — R202 Paresthesia of skin: Secondary | ICD-10-CM | POA: Insufficient documentation

## 2016-03-01 DIAGNOSIS — Z853 Personal history of malignant neoplasm of breast: Secondary | ICD-10-CM | POA: Insufficient documentation

## 2016-03-01 DIAGNOSIS — M1990 Unspecified osteoarthritis, unspecified site: Secondary | ICD-10-CM | POA: Insufficient documentation

## 2016-03-01 DIAGNOSIS — G8929 Other chronic pain: Secondary | ICD-10-CM | POA: Diagnosis not present

## 2016-03-01 DIAGNOSIS — M5489 Other dorsalgia: Secondary | ICD-10-CM | POA: Diagnosis not present

## 2016-03-01 DIAGNOSIS — M533 Sacrococcygeal disorders, not elsewhere classified: Secondary | ICD-10-CM | POA: Insufficient documentation

## 2016-03-01 DIAGNOSIS — H409 Unspecified glaucoma: Secondary | ICD-10-CM | POA: Insufficient documentation

## 2016-03-01 DIAGNOSIS — R0789 Other chest pain: Secondary | ICD-10-CM

## 2016-03-01 DIAGNOSIS — M545 Low back pain: Secondary | ICD-10-CM | POA: Diagnosis present

## 2016-03-01 DIAGNOSIS — Z87828 Personal history of other (healed) physical injury and trauma: Secondary | ICD-10-CM | POA: Diagnosis not present

## 2016-03-01 DIAGNOSIS — M6281 Muscle weakness (generalized): Secondary | ICD-10-CM | POA: Insufficient documentation

## 2016-03-01 DIAGNOSIS — K5900 Constipation, unspecified: Secondary | ICD-10-CM | POA: Diagnosis not present

## 2016-03-01 DIAGNOSIS — I10 Essential (primary) hypertension: Secondary | ICD-10-CM | POA: Insufficient documentation

## 2016-03-01 DIAGNOSIS — Z79899 Other long term (current) drug therapy: Secondary | ICD-10-CM | POA: Diagnosis not present

## 2016-03-01 DIAGNOSIS — R200 Anesthesia of skin: Secondary | ICD-10-CM | POA: Diagnosis not present

## 2016-03-01 DIAGNOSIS — Z87440 Personal history of urinary (tract) infections: Secondary | ICD-10-CM | POA: Diagnosis not present

## 2016-03-01 LAB — CBC
HCT: 43.7 % (ref 36.0–46.0)
Hemoglobin: 14.7 g/dL (ref 12.0–15.0)
MCH: 31.9 pg (ref 26.0–34.0)
MCHC: 33.6 g/dL (ref 30.0–36.0)
MCV: 94.8 fL (ref 78.0–100.0)
Platelets: 197 10*3/uL (ref 150–400)
RBC: 4.61 MIL/uL (ref 3.87–5.11)
RDW: 13.8 % (ref 11.5–15.5)
WBC: 6.2 10*3/uL (ref 4.0–10.5)

## 2016-03-01 LAB — URINALYSIS, ROUTINE W REFLEX MICROSCOPIC
Bilirubin Urine: NEGATIVE
Glucose, UA: NEGATIVE mg/dL
Ketones, ur: NEGATIVE mg/dL
Leukocytes, UA: NEGATIVE
Nitrite: NEGATIVE
Protein, ur: NEGATIVE mg/dL
Specific Gravity, Urine: 1.006 (ref 1.005–1.030)
pH: 7 (ref 5.0–8.0)

## 2016-03-01 LAB — BASIC METABOLIC PANEL
Anion gap: 11 (ref 5–15)
BUN: 7 mg/dL (ref 6–20)
CO2: 27 mmol/L (ref 22–32)
Calcium: 10.1 mg/dL (ref 8.9–10.3)
Chloride: 105 mmol/L (ref 101–111)
Creatinine, Ser: 0.71 mg/dL (ref 0.44–1.00)
GFR calc Af Amer: >60 mL/min (ref >60)
GFR calc non Af Amer: >60 mL/min (ref >60)
Glucose, Bld: 96 mg/dL (ref 65–99)
Potassium: 3.8 mmol/L (ref 3.5–5.1)
Sodium: 143 mmol/L (ref 135–145)

## 2016-03-01 LAB — CBG MONITORING, ED: Glucose-Capillary: 101 mg/dL — ABNORMAL HIGH (ref 65–99)

## 2016-03-01 LAB — URINE MICROSCOPIC-ADD ON
Bacteria, UA: NONE SEEN
Squamous Epithelial / LPF: NONE SEEN

## 2016-03-01 MED ORDER — FENTANYL CITRATE (PF) 100 MCG/2ML IJ SOLN
50.0000 ug | Freq: Once | INTRAMUSCULAR | Status: AC
  Administered 2016-03-01: 50 ug via INTRAVENOUS
  Filled 2016-03-01: qty 2
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
## 2016-03-01 NOTE — Telephone Encounter (Signed)
Called and LMVM for home and mobile for pt that was returning her call. If sudden, acute sx *numbness, tingling, weakness, then to seek care at ED.  Will f/u next week.
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
## 2016-03-01 NOTE — Telephone Encounter (Signed)
Patient called, states that starting 3 weeks ago, she has a lot of tingling and numbness all over, some difficulty understanding, weakness, denies trouble seeing in one or both eyes, trouble walking, loss of balance, coordination or headaches. Advised to contact Provider for referral, since this a new issue and transferred call to [place].

## 2016-03-01 NOTE — Patient Instructions (Signed)
Medication Instructions:  Your physician recommends that you continue on your current medications as directed. Please refer to the Current Medication list given to you today.  Labwork: None ordered  Testing/Procedures: Your physician has requested that you have an exercise stress test. For further information please visit [email or web address]. Please follow instruction sheet as given.  Follow-Up: Follow up to be determined after stress test has been reviewed by Dr. Curt Bears.  We will call you with results.   If you need a refill on your cardiac medications before your next appointment, please call your pharmacy.  Thank you for choosing [facility]!!   Trinidad Curet, RN [phone]   Any Other Special Instructions Will Be Listed Below (If Applicable).

## 2016-03-01 NOTE — Progress Notes (Signed)
Cardiology Office Note   Date:  03/01/2016   ID:  Barbara Rangel, DOB 01-25-1946, MRN [id number]  PCP:  Lottie Dawson, MD  Cardiologist:   Constance Haw, MD    Chief Complaint  Patient presents with  . Chest Pain  . New Patient (Initial Visit)     History of Present Illness: Barbara Rangel is a 70 y.o. female who presents today for cardiology evaluation.   She went to the emergency room on April 11 for left arm pain. Her labs were unremarkable including negative troponins. Her EKG showed sinus tachycardia with PVCs and no ST or T wave changes. She was diagnosed with anxiety the time and was given Ativan which improved her symptoms. She says that her chest pressure occurs at random times. She cannot tell if it is due to exertion, but she does sit and rest and this improves her chest pressure. She is not shortness of breath. She says that she can walk on flat ground without having to stopping climb up to 2 flights of stairs without issue. She sleeps on 2 pillows at night due to comfort issues, and does not wake up at night short of breath.   Today, she denies symptoms of palpitations, chest pain, shortness of breath, orthopnea, PND, lower extremity edema, claudication, dizziness, presyncope, syncope, bleeding, or neurologic sequela. The patient is tolerating medications without difficulties and is otherwise without complaint today.    Past Medical History  Diagnosis Date  . PONV (postoperative nausea and vomiting)   . Other and unspecified general anesthetics causing adverse effect in therapeutic use     hard to wake up  . Chronic back pain     right radicular leg pain  . Constipation     r/t pain meds and takes Dulcolax nightly  . FHx: colonic polyps     hx of and mother had colon cancer  . Chronic urinary tract infection     takes Macrodantin and Cranberry daily  . Glaucoma     uses Xalantan nightly  . Cancer ([place])     hx breast cancer-right  .  Hypertension     takes Amlodipine daily  . Arthritis     degenerative arthritis   Past Surgical History  Procedure Laterality Date  . Tubal ligation  1980  . Eye surgery  2004    right eye-detached retina  . Cataract surgery  2005    right eye  . Right breast biopsy  2005  . Breast lumpectomy      x2  . Left breast biopsy  2009  . Ankle surgery  2011    left ankle with screws and plates  . Colonoscopy    . Retinal detachment surgery  2004  . Mastectomy  2005    right  d/t breast cancer;no sticks to right arm LYMPH NODES REMOVED.  . Lumbar laminectomy/decompression microdiscectomy  10/31/2011    Procedure: LUMBAR LAMINECTOMY/DECOMPRESSION MICRODISCECTOMY;  Surgeon: Dahlia Bailiff;  Location: [place];  Service: Orthopedics;  Laterality: Right;  L4-5 Decompression with Right Facet Decompression   . Colonoscopy with propofol N/A 10/11/2014    Procedure: COLONOSCOPY WITH PROPOFOL;  Surgeon: Garlan Fair, MD;  Location: [facility];  Service: Endoscopy;  Laterality: N/A;     Current Outpatient Prescriptions  Medication Sig Dispense Refill  . acetaminophen (TYLENOL) 500 MG tablet Take 1,000 mg by mouth at bedtime.    Marland Kitchen alendronate (FOSAMAX) 70 MG tablet Take 1 tablet (70 mg total)  by mouth every 7 (seven) days. Hold while in hospital. Thursday.  Take with a full glass of water on an empty stomach. 4 tablet 2  . amLODipine (NORVASC) 5 MG tablet Take 5 mg by mouth daily.    Marland Kitchen anastrozole (ARIMIDEX) 1 MG tablet Take 1 tablet by mouth  daily 90 tablet 1  . aspirin EC 81 MG tablet Take 81 mg by mouth every morning.     Marland Kitchen atorvastatin (LIPITOR) 20 MG tablet Take 1 tablet by mouth every morning.    . calcium citrate-vitamin D (CITRACAL+D) 315-200 MG-UNIT per tablet Take 2 tablets by mouth 2 (two) times daily.     . Cranberry Extract 250 MG TABS Take 1 capsule by mouth at bedtime.     Marland Kitchen DIPHENHYDRAMINE HCL PO Take 25 mg by mouth at bedtime as needed.     . docusate sodium (COLACE) 100 MG  capsule Take 100 mg by mouth 2 (two) times daily.      Marland Kitchen latanoprost (XALATAN) 0.005 % ophthalmic solution Place 1 drop into both eyes at bedtime.      Marland Kitchen loratadine (CLARITIN REDITABS) 10 MG dissolvable tablet Take 10 mg by mouth daily as needed for allergies. For allergies    . Magnesium 250 MG TABS Take 250 mg by mouth at bedtime.     . Multiple Vitamins-Minerals (MULTIVITAMINS THER. W/MINERALS) TABS Take 1 tablet by mouth every morning.     . nitrofurantoin (MACRODANTIN) 50 MG capsule Take 50 mg by mouth 2 (two) times daily.    Marland Kitchen omeprazole (PRILOSEC) 40 MG capsule Take 40 mg by mouth every morning.      No current facility-administered medications for this visit.    Allergies:   Avelox; Tramadol; Trimethoprim; Cephalexin; Citalopram hydrobromide; Quinolones; and Cephalexin   Social History:  The patient  reports that she has never smoked. She has never used smokeless tobacco. She reports that she does not drink alcohol or use illicit drugs.   Family History:  The patient's family history includes Colon cancer in her mother. There is no history of Anesthesia problems, Hypotension, Malignant hyperthermia, or Pseudochol deficiency.    ROS:  Please see the history of present illness.   Otherwise, review of systems is positive for weakness, left shoulder pain, chest pressure, memory loss.   All other systems are reviewed and negative.    PHYSICAL EXAM: VS:  BP 140/84 mmHg  Pulse 94  Ht 5\' 5"  (1.651 m)  Wt 130 lb 9.6 oz (59.24 kg)  BMI 21.73 kg/m2 , BMI Body mass index is 21.73 kg/(m^2). GEN: Well nourished, well developed, in no acute distress HEENT: normal Neck: no JVD, carotid bruits, or masses Cardiac: RRR; no murmurs, rubs, or gallops,no edema  Respiratory:  clear to auscultation bilaterally, normal work of breathing GI: soft, nontender, nondistended, + BS MS: no deformity or atrophy Skin: warm and dry Neuro:  Strength and sensation are intact Psych: euthymic mood, full  affect  EKG:  EKG is ordered today. The ekg ordered today shows sinus rhythm, rate 94  Recent Labs: 02/14/2016: ALT 31 02/20/2016: BUN 10; Creatinine, Ser 0.74; Hemoglobin 15.7*; Platelets 200; Potassium 3.6; Sodium 142    Lipid Panel  No results found for: CHOL, TRIG, HDL, CHOLHDL, VLDL, LDLCALC, LDLDIRECT   Wt Readings from Last 3 Encounters:  03/01/16 130 lb 9.6 oz (59.24 kg)  02/20/16 133 lb (60.328 kg)  02/14/16 138 lb 14.2 oz (63 kg)      Other studies Reviewed: Additional studies/  records that were reviewed today include: ER notes, PCP notes   ASSESSMENT AND PLAN:  1.  Chest pressure: Fortunately she has had a workup for chest pressure in the emergency room with negative troponin and an EKG without ischemic changes. I told her that by her history it is unlikely that she has cardiac related chest discomfort. That being said, she does have improvement in her chest discomfort with rest. Due to that, we'll order a stress test to determine if she has cardiac causes of ischemia.    Current medicines are reviewed at length with the patient today.   The patient does not have concerns regarding her medicines.  The following changes were made today:  none  Labs/ tests ordered today include:  Orders Placed This Encounter  Procedures  . Myocardial Perfusion Imaging  . EKG 12-Lead    Disposition:   FU with Jakaiden Fill pending stress testing  Signed, Zylee Marchiano Meredith Leeds, MD  03/01/2016 4:04 PM     [place] [address] [phone] (office) [phone] (fax)

## 2016-03-01 NOTE — Telephone Encounter (Signed)
If there are new and acute issues, please advise patient to proceed to ER or if less severe or less acute, best to see PCP first.

## 2016-03-02 ENCOUNTER — Emergency Department (HOSPITAL_COMMUNITY): Payer: Medicare Other

## 2016-03-02 DIAGNOSIS — R200 Anesthesia of skin: Secondary | ICD-10-CM | POA: Diagnosis not present

## 2016-03-02 DIAGNOSIS — M533 Sacrococcygeal disorders, not elsewhere classified: Secondary | ICD-10-CM | POA: Diagnosis not present

## 2016-03-04 ENCOUNTER — Ambulatory Visit: Payer: Medicare Other | Admitting: Physical Therapy

## 2016-03-04 NOTE — Telephone Encounter (Signed)
Noted appt with Dr. Rexene Alberts 03-05-16.

## 2016-03-05 ENCOUNTER — Ambulatory Visit (INDEPENDENT_AMBULATORY_CARE_PROVIDER_SITE_OTHER): Payer: Medicare Other | Admitting: Neurology

## 2016-03-05 ENCOUNTER — Encounter: Payer: Self-pay | Admitting: Neurology

## 2016-03-05 VITALS — BP 132/72 | HR 78 | Resp 16 | Ht 65.0 in | Wt 131.0 lb

## 2016-03-05 DIAGNOSIS — R202 Paresthesia of skin: Secondary | ICD-10-CM

## 2016-03-05 DIAGNOSIS — M545 Low back pain, unspecified: Secondary | ICD-10-CM

## 2016-03-05 DIAGNOSIS — R200 Anesthesia of skin: Secondary | ICD-10-CM
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
## 2016-03-05 NOTE — Patient Instructions (Addendum)
We will do a lumbar spine (i.e. Lower back) MRI to look for degenerative changes. You have recently had a neck MRI and brain MRI. We will do an EMG and nerve conduction velocity test, which is an electrical nerve and muscle test, which we will schedule. We will call you with the results. Please keep your appointment with Dr. Valentina Shaggy, which is in June.

## 2016-03-06 ENCOUNTER — Telehealth: Payer: Self-pay | Admitting: Neurology

## 2016-03-06 NOTE — Telephone Encounter (Signed)
Dora/[facility] at [facility] called to check if pt has been seen. She is going to fax OV to Diane at her fax

## 2016-03-07 ENCOUNTER — Ambulatory Visit
Admission: RE | Admit: 2016-03-07 | Discharge: 2016-03-07 | Disposition: A | Discharge status: Home / Self Care | Payer: Medicare Other | Source: Ambulatory Visit | Attending: Neurology | Admitting: Neurology

## 2016-03-07 DIAGNOSIS — M545 Low back pain, unspecified: Secondary | ICD-10-CM

## 2016-03-07 DIAGNOSIS — M4806 Spinal stenosis, lumbar region: Secondary | ICD-10-CM | POA: Diagnosis not present

## 2016-03-07 DIAGNOSIS — R202 Paresthesia of skin: Principal | ICD-10-CM

## 2016-03-07 DIAGNOSIS — R200 Anesthesia of skin: Secondary | ICD-10-CM

## 2016-03-11 ENCOUNTER — Telehealth: Payer: Self-pay

## 2016-03-11 ENCOUNTER — Telehealth (HOSPITAL_COMMUNITY): Payer: Self-pay | Admitting: *Deleted

## 2016-03-11 NOTE — Telephone Encounter (Signed)
Patient given detailed instructions per Myocardial Perfusion Study Information Sheet for the test on 03/13/16. Patient notified to arrive 15 minutes early and that it is imperative to arrive on time for appointment to keep from having the test rescheduled.  If you need to cancel or reschedule your appointment, please call the office within 24 hours of your appointment. Failure to do so may result in a cancellation of your appointment, and a $50 no show fee. Patient verbalized understanding.Guss Farruggia J Roczen Waymire, RN  

## 2016-03-11 NOTE — Telephone Encounter (Signed)
-----   Message from Star Age, MD sent at 03/11/2016 10:04 AM EDT ----- Please call patient regarding her lumbar spine MRI: findings are in keeping with degenerative spine disease, she has some postoperative changes which were not there in 2012. Also, compared to her previous MRI from 2012 there are some worsening changes of degenerative arthritis, this can cause back pain and pinched nerve type pain. It may be worth her while to seek consultation with her spine specialist again. If she desires we can make a referral as well unless she has a previous orthopedic doctor she would like to see. Star Age, MD, PhD [facility])

## 2016-03-11 NOTE — Progress Notes (Signed)
Quick Note:  Please call patient regarding her lumbar spine MRI: findings are in keeping with degenerative spine disease, she has some postoperative changes which were not there in 2012. Also, compared to her previous MRI from 2012 there are some worsening changes of degenerative arthritis, this can cause back pain and pinched nerve type pain. It may be worth her while to seek consultation with her spine specialist again. If she desires we can make a referral as well unless she has a previous orthopedic doctor she would like to see. Star Age, MD, PhD [facility] ([facility])  ______

## 2016-03-11 NOTE — Telephone Encounter (Signed)
Patient is aware of results and recommendations. She would like referral placed to [facility] Dr. Melina Schools.  Patient requested a copy of this report which I will print off and patient will pick up at front desk and sign ROI form.

## 2016-03-12 ENCOUNTER — Encounter (HOSPITAL_COMMUNITY): Payer: Medicare Other

## 2016-03-12 ENCOUNTER — Telehealth: Payer: Self-pay | Admitting: Neurology

## 2016-03-12 NOTE — Telephone Encounter (Signed)
Patient called to request visit summary from 03/05/16 with Dr. Rexene Alberts.

## 2016-03-12 NOTE — Telephone Encounter (Signed)
I spoke to patient and advised her that I will put a copy of visit at front desk to p/u. I included ROI form.

## 2016-03-13 ENCOUNTER — Ambulatory Visit (HOSPITAL_COMMUNITY): Payer: Medicare Other | Attending: Internal Medicine

## 2016-03-13 DIAGNOSIS — R0789 Other chest pain: Secondary | ICD-10-CM | POA: Diagnosis present

## 2016-03-13 DIAGNOSIS — I779 Disorder of arteries and arterioles, unspecified: Secondary | ICD-10-CM | POA: Diagnosis not present

## 2016-03-13 DIAGNOSIS — I10 Essential (primary) hypertension: Secondary | ICD-10-CM | POA: Diagnosis not present

## 2016-03-13 LAB — MYOCARDIAL PERFUSION IMAGING
Estimated workload: 9.3 METS
Exercise duration (min): 7 min
Exercise duration (sec): 30 s
LV dias vol: 74 mL (ref 46–106)
LV sys vol: 28 mL
MPHR: 151 {beats}/min
Peak HR: 142 {beats}/min
Percent HR: 94 %
RATE: 0.25
RPE: 18
Rest HR: 76 {beats}/min
SDS: 0
SRS: 2
SSS: 2
TID: 1.01

## 2016-03-13 MED ORDER — TECHNETIUM TC 99M SESTAMIBI GENERIC - CARDIOLITE
32.3000 | Freq: Once | INTRAVENOUS | Status: AC | PRN
  Administered 2016-03-13: 32.3 via INTRAVENOUS

## 2016-03-13 MED ORDER — TECHNETIUM TC 99M SESTAMIBI GENERIC - CARDIOLITE
10.7000 | Freq: Once | INTRAVENOUS | Status: AC | PRN
  Administered 2016-03-13: 11 via INTRAVENOUS

## 2016-03-14 ENCOUNTER — Ambulatory Visit: Payer: No Typology Code available for payment source | Attending: Internal Medicine | Admitting: Physical Therapy

## 2016-03-14 ENCOUNTER — Encounter: Payer: Self-pay | Admitting: Physical Therapy

## 2016-03-14 DIAGNOSIS — M545 Low back pain: Secondary | ICD-10-CM | POA: Diagnosis not present

## 2016-03-14 DIAGNOSIS — M6281 Muscle weakness (generalized): Secondary | ICD-10-CM | POA: Diagnosis not present

## 2016-03-19 ENCOUNTER — Ambulatory Visit: Payer: No Typology Code available for payment source | Admitting: Physical Therapy

## 2016-03-19 ENCOUNTER — Encounter: Payer: Self-pay | Admitting: Physical Therapy

## 2016-03-19 DIAGNOSIS — M6281 Muscle weakness (generalized): Secondary | ICD-10-CM | POA: Diagnosis not present

## 2016-03-19 DIAGNOSIS — M545 Low back pain: Secondary | ICD-10-CM
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
## 2016-03-19 NOTE — Therapy (Signed)
[place], [place], [address] Phone: [phone]   Fax:  [phone]  Physical Therapy Treatment  Patient Details  Name: Barbara Rangel MRN: [id number] Date of Birth: [birth year] No Data Recorded  Encounter Date: 03/19/2016      PT End of Session - 03/19/16 1634    Visit Number 2   Number of Visits 13   Date for PT Re-Evaluation 04/25/16   PT Start Time [id number]   PT Stop Time [id number]   PT Time Calculation (min) 53 min      Past Medical History  Diagnosis Date  . PONV (postoperative nausea and vomiting)   . Other and unspecified general anesthetics causing adverse effect in therapeutic use     hard to wake up  . Chronic back pain     right radicular leg pain  . Constipation     r/t pain meds and takes Dulcolax nightly  . FHx: colonic polyps     hx of and mother had colon cancer  . Chronic urinary tract infection     takes Macrodantin and Cranberry daily  . Glaucoma     uses Xalantan nightly  . Cancer ([place])     hx breast cancer-right  . Hypertension     takes Amlodipine daily  . Arthritis     degenerative arthritis    Past Surgical History  Procedure Laterality Date  . Tubal ligation  1980  . Eye surgery  2004    right eye-detached retina  . Cataract surgery  2005    right eye  . Right breast biopsy  2005  . Breast lumpectomy      x2  . Left breast biopsy  2009  . Ankle surgery  2011    left ankle with screws and plates  . Colonoscopy    . Retinal detachment surgery  2004  . Mastectomy  2005    right  d/t breast cancer;no sticks to right arm LYMPH NODES REMOVED.  . Lumbar laminectomy/decompression microdiscectomy  10/31/2011    Procedure: LUMBAR LAMINECTOMY/DECOMPRESSION MICRODISCECTOMY;  Surgeon: Dahlia Bailiff;  Location: [place];  Service: Orthopedics;  Laterality: Right;  L4-5 Decompression with Right Facet Decompression   . Colonoscopy with propofol N/A 10/11/2014    Procedure:  COLONOSCOPY WITH PROPOFOL;  Surgeon: Garlan Fair, MD;  [facility];  Service: Endoscopy;  Laterality: N/A;    There were no vitals filed for this visit.      Subjective Assessment - 03/19/16 1546    Subjective Burning pain in low back today. feels heavy pressure on the bony aspects of her seat and back when seated.    Currently in Pain? Yes   Pain Score 3    Pain Location Back   Pain Descriptors / Indicators Burning            [facility] PT Assessment - 03/19/16 0001    Assessment   Hand Dominance Right   Next MD Visit not scheduled at this time   Prior Therapy approx 2 years ago for similar issue   ROM / Strength   AROM / PROM / Strength AROM;Strength   AROM   AROM Assessment Site Lumbar   Lumbar Flexion 90  at L2   Lumbar Extension 4   Lumbar - Right Side Bend 10   Lumbar - Left Side Bend 5   Strength   Strength Assessment Site Hip;Knee   Right/Left Hip Right;Left   Right Hip Flexion 4+/5  Right Hip Extension 3/5   Right Hip ABduction 3+/5   Left Hip Flexion 4+/5   Left Hip Extension 3/5   Left Hip ABduction 3+/5   Right/Left Knee Right;Left   Right Knee Flexion 4/5   Right Knee Extension 4+/5   Left Knee Flexion 4-/5   Left Knee Extension 4/5                     [facility] Adult PT Treatment/Exercise - 03/19/16 0001    Exercises   Exercises Lumbar;Other Exercises   Other Exercises  reformer bridge, leg press double and single   Lumbar Exercises: Stretches   Active Hamstring Stretch Other (comment)  x10 each   Lower Trunk Rotation 60 seconds   ITB Stretch 20 seconds   Lumbar Exercises: Seated   Sit to Stand 10 reps   Sit to Stand Limitations 5 sec eccentric sit   Lumbar Exercises: Supine   Bridge 15 reps   Bridge Limitations green Tband around knees   Modalities   Modalities Cryotherapy   Cryotherapy   Number Minutes Cryotherapy 10 Minutes   Cryotherapy Location Lumbar Spine   Type of Cryotherapy Ice pack                 PT Education - 03/19/16 1723    Education provided Yes   Education Details exercise form and rationale; stretching to relieve cramping.    Person(s) Educated Patient   Methods Explanation;Tactile cues;Demonstration;Verbal cues   Comprehension Verbalized understanding;Returned demonstration;Verbal cues required;Tactile cues required;Need further instruction          PT Short Term Goals - 03/14/16 1323    PT SHORT TERM GOAL #1   Title Average pain <3/10 by 5/25   Time 3   Period Weeks   Status New   PT SHORT TERM GOAL #2   Title Independent in HEP as it has been created to this point by 5/25   Time 6   Period Weeks   Status New   PT SHORT TERM GOAL #3   Title Able to demonstrate appropriate isolation of glut contraction and hip extension MMT to 4-/5   Time 3   Period Weeks   Status New           PT Long Term Goals - 03/14/16 1320    PT LONG TERM GOAL #1   Title Able to lay on either side without greater trochanter bursa pain by 6/15   Baseline unable d/t pain   Time 6   Period Weeks   Status New   PT LONG TERM GOAL #2   Title Pt will be able to sit for 20 min without back supported pain <=2/10   Time 6   Period Weeks   Status New   PT LONG TERM GOAL #3   Title BERG to 62% ability   Baseline 50% at eval   Time 6   Period Weeks   Status New   PT LONG TERM GOAL #4   Title Able to perform [place] with LBP <=2/10   Baseline quite difficult at eval   Time 6   Period Weeks   Status New               Plan - 03/19/16 1724    Clinical Impression Statement Decreased burning sensation in low back today but pt reported cramping sensation in R quads. Discussed stretching to relieve cramps. Pt reports she frequently has nigth cramping and was educated in slight  increase with activity.    PT Next Visit Plan glut activation and strengthening, incoorporation of extremities with transverse abdominis contraction   PT Home Exercise Plan In session, pt was educated to  perform supine glut sets, ITband stretch; sent for HEP after pt was able to demonstrate appropriate form.       Patient will benefit from skilled therapeutic intervention in order to improve the following deficits and impairments:  Pain, Postural dysfunction, Increased muscle spasms, Decreased coordination, Decreased range of motion, Decreased strength  Visit Diagnosis: Bilateral low back pain, with sciatica presence unspecified  Muscle weakness (generalized)       G-Codes - 2016-04-09 0849    Functional Assessment Tool Used FOTO 50% disability, clinical judgement   Functional Limitation Mobility: Walking and moving around   Mobility: Walking and Moving Around Current Status [phone]) At least 40 percent but less than 60 percent impaired, limited or restricted   Mobility: Walking and Moving Around Goal Status [phone]) At least 20 percent but less than 40 percent impaired, limited or restricted      Problem List Patient Active Problem List   Diagnosis Date Noted  . Malignant neoplasm of right female breast ([place]) 01/23/2016  . History of chemotherapy 01/23/2016  . High risk medication use 07/29/2015  . Screening mammogram for high-risk patient 07/29/2015  . Estrogen deficiency 07/29/2015  . 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/24/2015  . Dense breast tissue 01/24/2015  . Hx of adenomatous colonic polyps 01/24/2015  . Plantar fasciitis, bilateral 01/24/2015  . Breast cancer, right breast ([place]) 07/15/2013  . Syncope 09/15/2012  . Hypokalemia 09/15/2012  . Chronic back pain   . Chronic urinary tract infection   . Hypertension   . Arthritis   . Synovial cyst of lumbar facet joint 11/01/2011    Leotta Weingarten C. Tyrae Alcoser PT, DPT 04/09/16 5:27 PM   [facility] [address] [place], [place], [address] Phone: [phone]   Fax:  [phone]  Name: Barbara Rangel MRN: [id number] Date of Birth: 05-18-1946

## 2016-03-20 ENCOUNTER — Telehealth: Payer: Self-pay | Admitting: Neurology

## 2016-03-20 ENCOUNTER — Encounter: Payer: Self-pay | Admitting: Cardiology

## 2016-03-20 NOTE — Telephone Encounter (Signed)
FYI I explained what EMG/NCV testing was and the purpose of it. She reported that her numbness is currently a lot less than it was.  Patient stated that she doesn't want to do EMG/NCV at this time. Saying that since she "feels better"  the procedure is "too invasive". I have cancelled her EMG/NCV appts.

## 2016-03-20 NOTE — Telephone Encounter (Signed)
Patient is calling and states she would like a call back about her EMG.  She was advised that the EMG is normally done with the NCV but still wanted to speak with the nurse.

## 2016-03-20 NOTE — Telephone Encounter (Signed)
This encounter was created in error - please disregard.

## 2016-03-20 NOTE — Telephone Encounter (Signed)
New Message ° °Pt returned call for results // Please call °

## 2016-03-21 ENCOUNTER — Ambulatory Visit: Payer: No Typology Code available for payment source | Admitting: Physical Therapy

## 2016-03-21 ENCOUNTER — Encounter: Payer: Self-pay | Admitting: Physical Therapy

## 2016-03-21 DIAGNOSIS — M6281 Muscle weakness (generalized): Secondary | ICD-10-CM

## 2016-03-21 DIAGNOSIS — M545 Low back pain: Secondary | ICD-10-CM | POA: Diagnosis not present

## 2016-03-21 NOTE — Therapy (Signed)
[place], [place], [address] Phone: [phone]   Fax:  [phone]  Physical Therapy Treatment  Patient Details  Name: Barbara Rangel MRN: [id number] Date of Birth: [birth year] No Data Recorded  Encounter Date: 03/21/2016      PT End of Session - 03/21/16 1540    Visit Number 3   Number of Visits 13   Date for PT Re-Evaluation 04/25/16   PT Start Time [id number]   PT Stop Time [id number]   PT Time Calculation (min) 57 min   Activity Tolerance Patient tolerated treatment well;No increased pain   Behavior During Therapy [facility] for tasks assessed/performed      Past Medical History  Diagnosis Date  . PONV (postoperative nausea and vomiting)   . Other and unspecified general anesthetics causing adverse effect in therapeutic use     hard to wake up  . Chronic back pain     right radicular leg pain  . Constipation     r/t pain meds and takes Dulcolax nightly  . FHx: colonic polyps     hx of and mother had colon cancer  . Chronic urinary tract infection     takes Macrodantin and Cranberry daily  . Glaucoma     uses Xalantan nightly  . Cancer ([place])     hx breast cancer-right  . Hypertension     takes Amlodipine daily  . Arthritis     degenerative arthritis    Past Surgical History  Procedure Laterality Date  . Tubal ligation  1980  . Eye surgery  2004    right eye-detached retina  . Cataract surgery  2005    right eye  . Right breast biopsy  2005  . Breast lumpectomy      x2  . Left breast biopsy  2009  . Ankle surgery  2011    left ankle with screws and plates  . Colonoscopy    . Retinal detachment surgery  2004  . Mastectomy  2005    right  d/t breast cancer;no sticks to right arm LYMPH NODES REMOVED.  . Lumbar laminectomy/decompression microdiscectomy  10/31/2011    Procedure: LUMBAR LAMINECTOMY/DECOMPRESSION MICRODISCECTOMY;  Surgeon: Dahlia Bailiff;  Location: [place];  Service:  Orthopedics;  Laterality: Right;  L4-5 Decompression with Right Facet Decompression   . Colonoscopy with propofol N/A 10/11/2014    Procedure: COLONOSCOPY WITH PROPOFOL;  Surgeon: Garlan Fair, MD;  [facility];  Service: Endoscopy;  Laterality: N/A;    There were no vitals filed for this visit.      Subjective Assessment - 03/21/16 1542    Subjective Notices a little bit of difference towards getting better, it is difficult to pinpoint exactly how.   Currently in Pain? Yes   Pain Score 4    Pain Location Back   Pain Orientation Lower;Medial   Pain Descriptors / Indicators Burning   Aggravating Factors  sitting without cushion   Pain Relieving Factors sitting on personal cushion                         [facility] Adult PT Treatment/Exercise - 03/21/16 0001    Exercises   Exercises Knee/Hip   Lumbar Exercises: Aerobic   Stationary Bike nu step 31min L3   Lumbar Exercises: Supine   Bridge 10 reps   Other Supine Lumbar Exercises dead bug x10 ea   Knee/Hip Exercises: Stretches   Active Hamstring Stretch  Other (comment)  x10   Knee/Hip Exercises: Supine   Knee Flexion Other (comment)  HS curl over physioball   Knee Flexion Limitations x15   Knee/Hip Exercises: Sidelying   Clams 20 each   Knee/Hip Exercises: Prone   Hamstring Curl 20 reps   Hamstring Curl Limitations 1lb   Modalities   Modalities Moist Heat   Moist Heat Therapy   Number Minutes Moist Heat 10 Minutes   Moist Heat Location Lumbar Spine   Manual Therapy   Manual therapy comments soft tissue and IASTM to R lumbar paraspinals                PT Education - 03/21/16 1630    Education provided Yes   Education Details rationale for manual techniques, tightness of muscles resulting in pain, exercise form and rationale   Person(s) Educated Patient   Methods Explanation;Tactile cues;Verbal cues   Comprehension Verbalized understanding;Verbal cues required;Tactile cues  required;Need further instruction          PT Short Term Goals - 03/14/16 1323    PT SHORT TERM GOAL #1   Title Average pain <3/10 by 5/25   Time 3   Period Weeks   Status New   PT SHORT TERM GOAL #2   Title Independent in HEP as it has been created to this point by 5/25   Time 6   Period Weeks   Status New   PT SHORT TERM GOAL #3   Title Able to demonstrate appropriate isolation of glut contraction and hip extension MMT to 4-/5   Time 3   Period Weeks   Status New           PT Long Term Goals - 03/14/16 1320    PT LONG TERM GOAL #1   Title Able to lay on either side without greater trochanter bursa pain by 6/15   Baseline unable d/t pain   Time 6   Period Weeks   Status New   PT LONG TERM GOAL #2   Title Pt will be able to sit for 20 min without back supported pain <=2/10   Time 6   Period Weeks   Status New   PT LONG TERM GOAL #3   Title BERG to 62% ability   Baseline 50% at eval   Time 6   Period Weeks   Status New   PT LONG TERM GOAL #4   Title Able to perform [place] with LBP <=2/10   Baseline quite difficult at eval   Time 6   Period Weeks   Status New               Plan - 03/21/16 1631    Clinical Impression Statement Decreased pain today following manual treatment. Notable tightness in R paraspinals vs L, trigger point recreating concordant pain at L2-3 level.    PT Treatment/Interventions ADLs/Self Care Home Management;Cryotherapy;Electrical Stimulation;Ultrasound;Traction;Moist Heat;Iontophoresis 4mg /ml Dexamethasone;Gait training;Stair training;Functional mobility training;Therapeutic activities;Therapeutic exercise;Balance training;Patient/family education;Neuromuscular re-education;Manual techniques;Taping;Dry needling;Passive range of motion   PT Next Visit Plan glut activation and strengthening, incoorporation of extremities with transverse abdominis contraction, lumbar paraspinal manual work prn   PT Home Exercise Plan In session, pt was  educated to perform supine glut sets, ITband stretch; sent for HEP after pt was able to demonstrate appropriate form.    Consulted and Agree with Plan of Care Patient      Patient will benefit from skilled therapeutic intervention in order to improve the following deficits and impairments:  Pain, Postural dysfunction, Increased muscle spasms, Decreased coordination, Decreased range of motion, Decreased strength  Visit Diagnosis: Bilateral low back pain, with sciatica presence unspecified  Muscle weakness (generalized)     Problem List Patient Active Problem List   Diagnosis Date Noted  . Malignant neoplasm of right female breast ([place]) 01/23/2016  . History of chemotherapy 01/23/2016  . High risk medication use 07/29/2015  . Screening mammogram for high-risk patient 07/29/2015  . Estrogen deficiency 07/29/2015  . 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/24/2015  . Dense breast tissue 01/24/2015  . Hx of adenomatous colonic polyps 01/24/2015  . Plantar fasciitis, bilateral 01/24/2015  . Breast cancer, right breast ([place]) 07/15/2013  . Syncope 09/15/2012  . Hypokalemia 09/15/2012  . Chronic back pain   . Chronic urinary tract infection   . Hypertension   . Arthritis   . Synovial cyst of lumbar facet joint 11/01/2011   Lissy Deuser C. Raymie Trani PT, DPT 03/21/2016 4:40 PM   [facility] [address] [place], [place], [address] Phone: [phone]   Fax:  [phone]  Name: TAMATHA MURTHY MRN: [id number] Date of Birth: 1946/03/11

## 2016-03-26 ENCOUNTER — Ambulatory Visit: Payer: No Typology Code available for payment source | Admitting: Physical Therapy

## 2016-03-26 ENCOUNTER — Encounter: Payer: Self-pay | Admitting: Physical Therapy

## 2016-03-26 DIAGNOSIS — M6281 Muscle weakness (generalized): Secondary | ICD-10-CM | POA: Diagnosis not present

## 2016-03-26 DIAGNOSIS — M545 Low back pain: Secondary | ICD-10-CM | POA: Diagnosis not present

## 2016-03-26 NOTE — Therapy (Signed)
[place], [place], [address] Phone: [phone]   Fax:  [phone]  Physical Therapy Treatment  Patient Details  Name: Barbara Rangel MRN: [id number] Date of Birth: 1945/11/12 No Data Recorded  Encounter Date: 03/26/2016      PT End of Session - 03/26/16 0847    Visit Number 4   Number of Visits 13   Date for PT Re-Evaluation 04/25/16   PT Start Time 0845   PT Stop Time 0943   PT Time Calculation (min) 58 min      Past Medical History  Diagnosis Date  . PONV (postoperative nausea and vomiting)   . Other and unspecified general anesthetics causing adverse effect in therapeutic use     hard to wake up  . Chronic back pain     right radicular leg pain  . Constipation     r/t pain meds and takes Dulcolax nightly  . FHx: colonic polyps     hx of and mother had colon cancer  . Chronic urinary tract infection     takes Macrodantin and Cranberry daily  . Glaucoma     uses Xalantan nightly  . Cancer ([facility])     hx breast cancer-right  . Hypertension     takes Amlodipine daily  . Arthritis     degenerative arthritis    Past Surgical History  Procedure Laterality Date  . Tubal ligation  1980  . Eye surgery  2004    right eye-detached retina  . Cataract surgery  2005    right eye  . Right breast biopsy  2005  . Breast lumpectomy      x2  . Left breast biopsy  2009  . Ankle surgery  2011    left ankle with screws and plates  . Colonoscopy    . Retinal detachment surgery  2004  . Mastectomy  2005    right  d/t breast cancer;no sticks to right arm LYMPH NODES REMOVED.  . Lumbar laminectomy/decompression microdiscectomy  10/31/2011    Procedure: LUMBAR LAMINECTOMY/DECOMPRESSION MICRODISCECTOMY;  Surgeon: Dahlia Bailiff;  Location: [facility];  Service: Orthopedics;  Laterality: Right;  L4-5 Decompression with Right Facet Decompression   . Colonoscopy with propofol N/A 10/11/2014    Procedure:  COLONOSCOPY WITH PROPOFOL;  Surgeon: Garlan Fair, MD;  [facility];  Service: Endoscopy;  Laterality: N/A;    There were no vitals filed for this visit.      Subjective Assessment - 03/26/16 0848    Subjective Feels that the pain is about the same but is not as constant. Sometimes feeling pain up the middle of her back.    Currently in Pain? Yes   Pain Score 4   4 or 5   Pain Location Back   Pain Orientation Lower;Mid   Pain Descriptors / Indicators Burning                         [facility] Adult PT Treatment/Exercise - 03/26/16 0001    Lumbar Exercises: Aerobic   Stationary Bike nu step 38min L4   Lumbar Exercises: Supine   Other Supine Lumbar Exercises hooklying marches x20 ea   Other Supine Lumbar Exercises LE rhythmic stabs   Knee/Hip Exercises: Supine   Other Supine Knee/Hip Exercises iso add, green tband abd 10x5s ea   Cryotherapy   Number Minutes Cryotherapy 10 Minutes   Cryotherapy Location Lumbar Spine   Type of  Cryotherapy Ice pack   Manual Therapy   Manual therapy comments IASTM bilat lumbar paraspinals, trigger point release bilat QL, trigger point release L UT and bilat rib ER mobilizations                PT Education - 03/26/16 1109    Education provided Yes   Education Details trigger points and referral patterns, possible RC injury, overuse of low back to compensate for lack of core activation   Person(s) Educated Patient   Methods Explanation;Demonstration;Tactile cues;Verbal cues   Comprehension Verbalized understanding;Returned demonstration;Verbal cues required;Tactile cues required;Need further instruction          PT Short Term Goals - 03/14/16 1323    PT SHORT TERM GOAL #1   Title Average pain <3/10 by 5/25   Time 3   Period Weeks   Status New   PT SHORT TERM GOAL #2   Title Independent in HEP as it has been created to this point by 5/25   Time 6   Period Weeks   Status New   PT SHORT TERM GOAL #3    Title Able to demonstrate appropriate isolation of glut contraction and hip extension MMT to 4-/5   Time 3   Period Weeks   Status New           PT Long Term Goals - 03/14/16 1320    PT LONG TERM GOAL #1   Title Able to lay on either side without greater trochanter bursa pain by 6/15   Baseline unable d/t pain   Time 6   Period Weeks   Status New   PT LONG TERM GOAL #2   Title Pt will be able to sit for 20 min without back supported pain <=2/10   Time 6   Period Weeks   Status New   PT LONG TERM GOAL #3   Title BERG to 62% ability   Baseline 50% at eval   Time 6   Period Weeks   Status New   PT LONG TERM GOAL #4   Title Able to perform [place] with LBP <=2/10   Baseline quite difficult at eval   Time 6   Period Weeks   Status New               Plan - 03/26/16 1110    Clinical Impression Statement Pt demo improved activation of abdominals following treatment today, reported discomfort in low back with core activation due to pulling from attachment sites. May have script sent for shoulder pain at next visit.    PT Treatment/Interventions ADLs/Self Care Home Management;Cryotherapy;Electrical Stimulation;Ultrasound;Traction;Moist Heat;Iontophoresis 4mg /ml Dexamethasone;Gait training;Stair training;Functional mobility training;Therapeutic activities;Therapeutic exercise;Balance training;Patient/family education;Neuromuscular re-education;Manual techniques;Taping;Dry needling;Passive range of motion   PT Next Visit Plan glut activation and strengthening, incoorporation of extremities with transverse abdominis contraction, lumbar paraspinal manual work prn; possible re-eval for shoulder.    PT Home Exercise Plan In session, pt was educated to perform supine glut sets, ITband stretch; sent for HEP after pt was able to demonstrate appropriate form.    Consulted and Agree with Plan of Care Patient      Patient will benefit from skilled therapeutic intervention in order to  improve the following deficits and impairments:  Pain, Postural dysfunction, Increased muscle spasms, Decreased coordination, Decreased range of motion, Decreased strength  Visit Diagnosis: Bilateral low back pain, with sciatica presence unspecified  Muscle weakness (generalized)     Problem List Patient Active Problem List   Diagnosis Date Noted  .  Malignant neoplasm of right female breast ([place]) 01/23/2016  . History of chemotherapy 01/23/2016  . High risk medication use 07/29/2015  . Screening mammogram for high-risk patient 07/29/2015  . Estrogen deficiency 07/29/2015  . 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/24/2015  . Dense breast tissue 01/24/2015  . Hx of adenomatous colonic polyps 01/24/2015  . Plantar fasciitis, bilateral 01/24/2015  . Breast cancer, right breast ([place]) 07/15/2013  . Syncope 09/15/2012  . Hypokalemia 09/15/2012  . Chronic back pain   . Chronic urinary tract infection   . Hypertension   . Arthritis   . Synovial cyst of lumbar facet joint 11/01/2011   Reene Harlacher C. Kauan Kloosterman PT, DPT 03/26/2016 11:12 AM   [facility] [address] [place], [place], [address] Phone: [phone]   Fax:  [phone]  Name: Barbara Rangel MRN: [id number] Date of Birth: 12-09-1945

## 2016-03-28 ENCOUNTER — Ambulatory Visit: Payer: No Typology Code available for payment source | Admitting: Physical Therapy

## 2016-03-28 ENCOUNTER — Encounter: Payer: Self-pay | Admitting: Physical Therapy

## 2016-03-28 DIAGNOSIS — M6281 Muscle weakness (generalized): Secondary | ICD-10-CM | POA: Diagnosis not present

## 2016-03-28 DIAGNOSIS — M545 Low back pain: Secondary | ICD-10-CM | POA: Diagnosis not present

## 2016-03-28 NOTE — Therapy (Signed)
[place], [place], [address] Phone: [phone]   Fax:  [phone]  Physical Therapy Treatment  Patient Details  Name: Barbara Rangel MRN: [id number] Date of Birth: 14-Dec-1945 No Data Recorded  Encounter Date: 03/28/2016      PT End of Session - 03/28/16 0856    Visit Number 5   Number of Visits 13   Date for PT Re-Evaluation 04/25/16   PT Start Time 0846   PT Stop Time 0952   PT Time Calculation (min) 66 min   Activity Tolerance Patient limited by pain   Behavior During Therapy [facility] for tasks assessed/performed      Past Medical History  Diagnosis Date  . PONV (postoperative nausea and vomiting)   . Other and unspecified general anesthetics causing adverse effect in therapeutic use     hard to wake up  . Chronic back pain     right radicular leg pain  . Constipation     r/t pain meds and takes Dulcolax nightly  . FHx: colonic polyps     hx of and mother had colon cancer  . Chronic urinary tract infection     takes Macrodantin and Cranberry daily  . Glaucoma     uses Xalantan nightly  . Cancer ([place])     hx breast cancer-right  . Hypertension     takes Amlodipine daily  . Arthritis     degenerative arthritis    Past Surgical History  Procedure Laterality Date  . Tubal ligation  1980  . Eye surgery  2004    right eye-detached retina  . Cataract surgery  2005    right eye  . Right breast biopsy  2005  . Breast lumpectomy      x2  . Left breast biopsy  2009  . Ankle surgery  2011    left ankle with screws and plates  . Colonoscopy    . Retinal detachment surgery  2004  . Mastectomy  2005    right  d/t breast cancer;no sticks to right arm LYMPH NODES REMOVED.  . Lumbar laminectomy/decompression microdiscectomy  10/31/2011    Procedure: LUMBAR LAMINECTOMY/DECOMPRESSION MICRODISCECTOMY;  Surgeon: Dahlia Bailiff;  Location: [facility];  Service: Orthopedics;  Laterality: Right;  L4-5  Decompression with Right Facet Decompression   . Colonoscopy with propofol N/A 10/11/2014    Procedure: COLONOSCOPY WITH PROPOFOL;  Surgeon: Garlan Fair, MD;  [facility];  Service: Endoscopy;  Laterality: N/A;    There were no vitals filed for this visit.      Subjective Assessment - 03/28/16 0854    Subjective Pt reports having a rhams horn pattern HA that was very intense and woke her up. Reports scratchy sensation up and down lower back bilaterally. Continues to have burning around bilat SIJ.    Currently in Pain? Yes   Pain Score 6    Pain Location Back   Pain Orientation Lower   Pain Descriptors / Indicators Aching;Burning;Sore  scratchy                         [facility] Adult PT Treatment/Exercise - 03/28/16 0001    Lumbar Exercises: Supine   AB Set Limitations posterior pelvic tilts   Large Ball Abdominal Isometric Limitations hooklying 5s/5s   Other Supine Lumbar Exercises hooklying iso abduction, iso adduction   Manual Therapy   Manual therapy comments MET L ant/R post innominate rotation  PT Education - 03/28/16 1038    Education provided Yes   Education Details pelvic rotation and stabilization, ESTIM, dry needling, referral HA patterns from trigger point    Person(s) Educated Patient   Methods Explanation;Tactile cues;Verbal cues   Comprehension Verbalized understanding;Verbal cues required;Tactile cues required;Need further instruction          PT Short Term Goals - 03/14/16 1323    PT SHORT TERM GOAL #1   Title Average pain <3/10 by 5/25   Time 3   Period Weeks   Status New   PT SHORT TERM GOAL #2   Title Independent in HEP as it has been created to this point by 5/25   Time 6   Period Weeks   Status New   PT SHORT TERM GOAL #3   Title Able to demonstrate appropriate isolation of glut contraction and hip extension MMT to 4-/5   Time 3   Period Weeks   Status New           PT Long Term Goals  - 03/14/16 1320    PT LONG TERM GOAL #1   Title Able to lay on either side without greater trochanter bursa pain by 6/15   Baseline unable d/t pain   Time 6   Period Weeks   Status New   PT LONG TERM GOAL #2   Title Pt will be able to sit for 20 min without back supported pain <=2/10   Time 6   Period Weeks   Status New   PT LONG TERM GOAL #3   Title BERG to 62% ability   Baseline 50% at eval   Time 6   Period Weeks   Status New   PT LONG TERM GOAL #4   Title Able to perform [place] with LBP <=2/10   Baseline quite difficult at eval   Time 6   Period Weeks   Status New               Plan - 03/28/16 1031    Clinical Impression Statement Pelvic rotation was corrected with MET today and was followed by exercises for stabilization. Pt reported cramping in RLE following treatment today. Attempted ESTIM at [facility], reported feeling a tingling around where the pads were placed and a little bit traveling to other areas of her body. Was unable to describe how she felt at the end of the session.    PT Treatment/Interventions ADLs/Self Care Home Management;Cryotherapy;Electrical Stimulation;Ultrasound;Traction;Moist Heat;Iontophoresis '4mg'$ /ml Dexamethasone;Gait training;Stair training;Functional mobility training;Therapeutic activities;Therapeutic exercise;Balance training;Patient/family education;Neuromuscular re-education;Manual techniques;Taping;Dry needling;Passive range of motion   PT Next Visit Plan lumbo pelvic stabilization   PT Home Exercise Plan In session, pt was educated to perform supine glut sets, ITband stretch; sent for HEP after pt was able to demonstrate appropriate form.    Consulted and Agree with Plan of Care Patient      Patient will benefit from skilled therapeutic intervention in order to improve the following deficits and impairments:  Pain, Postural dysfunction, Increased muscle spasms, Decreased coordination, Decreased range of motion, Decreased strength  Visit  Diagnosis: Bilateral low back pain, with sciatica presence unspecified  Muscle weakness (generalized)     Problem List Patient Active Problem List   Diagnosis Date Noted  . Malignant neoplasm of right female breast ([place]) 01/23/2016  . History of chemotherapy 01/23/2016  . High risk medication use 07/29/2015  . Screening mammogram for high-risk patient 07/29/2015  . Estrogen deficiency 07/29/2015  . Osteopenia due to cancer therapy  01/24/2015  . Dense breast tissue 01/24/2015  . Hx of adenomatous colonic polyps 01/24/2015  . Plantar fasciitis, bilateral 01/24/2015  . Breast cancer, right breast ([place]) 07/15/2013  . Syncope 09/15/2012  . Hypokalemia 09/15/2012  . Chronic back pain   . Chronic urinary tract infection   . Hypertension   . Arthritis   . Synovial cyst of lumbar facet joint 11/01/2011    Maddison Kilner C. Batu Cassin PT, DPT 03/28/2016 12:54 PM   [facility] [address] [place], [place], [address] Phone: [phone]   Fax:  [phone]  Name: Barbara Rangel MRN: [id number] Date of Birth: 14-Jan-1946

## 2016-04-02 ENCOUNTER — Ambulatory Visit: Payer: No Typology Code available for payment source | Admitting: Physical Therapy

## 2016-04-02 DIAGNOSIS — M6281 Muscle weakness (generalized): Secondary | ICD-10-CM | POA: Diagnosis not present

## 2016-04-02 DIAGNOSIS — M545 Low back pain: Secondary | ICD-10-CM

## 2016-04-02 NOTE — Therapy (Signed)
[place], [place], [address] Phone: [phone]   Fax:  [phone]  Physical Therapy Treatment  Patient Details  Name: Barbara Rangel MRN: [id number] Date of Birth: 29-Oct-1946 No Data Recorded  Encounter Date: 04/02/2016      PT End of Session - 04/02/16 1321    Visit Number 6   Number of Visits 13   Date for PT Re-Evaluation 04/25/16   PT Start Time 0933   PT Stop Time 1027   PT Time Calculation (min) 54 min   Activity Tolerance Patient tolerated treatment well   Behavior During Therapy [facility] for tasks assessed/performed      Past Medical History  Diagnosis Date  . PONV (postoperative nausea and vomiting)   . Other and unspecified general anesthetics causing adverse effect in therapeutic use     hard to wake up  . Chronic back pain     right radicular leg pain  . Constipation     r/t pain meds and takes Dulcolax nightly  . FHx: colonic polyps     hx of and mother had colon cancer  . Chronic urinary tract infection     takes Macrodantin and Cranberry daily  . Glaucoma     uses Xalantan nightly  . Cancer ([place])     hx breast cancer-right  . Hypertension     takes Amlodipine daily  . Arthritis     degenerative arthritis    Past Surgical History  Procedure Laterality Date  . Tubal ligation  1980  . Eye surgery  2004    right eye-detached retina  . Cataract surgery  2005    right eye  . Right breast biopsy  2005  . Breast lumpectomy      x2  . Left breast biopsy  2009  . Ankle surgery  2011    left ankle with screws and plates  . Colonoscopy    . Retinal detachment surgery  2004  . Mastectomy  2005    right  d/t breast cancer;no sticks to right arm LYMPH NODES REMOVED.  . Lumbar laminectomy/decompression microdiscectomy  10/31/2011    Procedure: LUMBAR LAMINECTOMY/DECOMPRESSION MICRODISCECTOMY;  Surgeon: Dahlia Bailiff;  Location: [facility];  Service: Orthopedics;  Laterality:  Right;  L4-5 Decompression with Right Facet Decompression   . Colonoscopy with propofol N/A 10/11/2014    Procedure: COLONOSCOPY WITH PROPOFOL;  Surgeon: Garlan Fair, MD;  [facility];  Service: Endoscopy;  Laterality: N/A;    There were no vitals filed for this visit.      Subjective Assessment - 04/02/16 1318    Subjective burning back pain today. PT seems to help .   Currently in Pain? Yes   Pain Score 5    Pain Location Back   Pain Orientation Lower   Pain Descriptors / Indicators Burning   Aggravating Factors  sitting without cushion   Pain Relieving Factors exercise, using cushion                         [facility] Adult PT Treatment/Exercise - 04/02/16 0937    Lumbar Exercises: Supine   Clam 5 reps  single leg, neutral   Heel Slides Limitations  unable to do with control so stopped.    Bent Knee Raise 5 reps   Bridge 10 reps   Bridge Limitations small lifts   Other Supine Lumbar Exercises hip abduction 10 X isometric   Lumbar  Exercises: Sidelying   Clam 10 reps  each, cues   Other Sidelying Lumbar Exercises Sidelying  left,/  Right 5 bookopeners   Knee/Hip Exercises: Supine   Straight Leg Raises 10 reps  right only   Other Supine Knee/Hip Exercises Hip adductor squeeze hooklying 10 X 5 seconds,  pink foam roller.   Other Supine Knee/Hip Exercises Muscle energy to lower Right ASIS 3 sets 5 X each holding 5 seconds.  Burning in back lessened   Cryotherapy   Number Minutes Cryotherapy 10 Minutes   Cryotherapy Location Lumbar Spine  sitting   Type of Cryotherapy --  cold pack                PT Education - 04/02/16 1321    Education provided Yes   Education Details pre - pilates   Person(s) Educated Patient   Methods Explanation;Tactile cues;Verbal cues;Handout   Comprehension Verbalized understanding;Returned demonstration          PT Short Term Goals - 04/02/16 1324    PT SHORT TERM GOAL #1   Title Average pain <3/10  by 5/25   Baseline 5/10   Time 3   Period Weeks   Status On-going   PT SHORT TERM GOAL #2   Title Independent in HEP as it has been created to this point by 5/25   Time 6   Period Weeks   Status On-going   PT SHORT TERM GOAL #3   Title Able to demonstrate appropriate isolation of glut contraction and hip extension MMT to 4-/5   Time 3   Period Weeks   Status Unable to assess           PT Long Term Goals - 03/14/16 1320    PT LONG TERM GOAL #1   Title Able to lay on either side without greater trochanter bursa pain by 6/15   Baseline unable d/t pain   Time 6   Period Weeks   Status New   PT LONG TERM GOAL #2   Title Pt will be able to sit for 20 min without back supported pain <=2/10   Time 6   Period Weeks   Status New   PT LONG TERM GOAL #3   Title BERG to 62% ability   Baseline 50% at eval   Time 6   Period Weeks   Status New   PT LONG TERM GOAL #4   Title Able to perform [place] with LBP <=2/10   Baseline quite difficult at eval   Time 6   Period Weeks   Status New               Plan - 04/02/16 1322    Clinical Impression Statement Right ASIS higher initially,  MET helpful,  Stabilization, pre- pilates added to home exercises. Heel slides not added to home exercises, not quite ready yet.  Pain returned upon sitting.    PT Next Visit Plan pre pilates exercises   PT Home Exercise Plan pre- pilates exercises   Consulted and Agree with Plan of Care Patient      Patient will benefit from skilled therapeutic intervention in order to improve the following deficits and impairments:  Pain, Postural dysfunction, Increased muscle spasms, Decreased coordination, Decreased range of motion, Decreased strength  Visit Diagnosis: Bilateral low back pain, with sciatica presence unspecified  Muscle weakness (generalized)     Problem List Patient Active Problem List   Diagnosis Date Noted  . Malignant neoplasm of right female  breast ([place]) 01/23/2016  . History  of chemotherapy 01/23/2016  . High risk medication use 07/29/2015  . Screening mammogram for high-risk patient 07/29/2015  . Estrogen deficiency 07/29/2015  . 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/24/2015  . Dense breast tissue 01/24/2015  . Hx of adenomatous colonic polyps 01/24/2015  . Plantar fasciitis, bilateral 01/24/2015  . Breast cancer, right breast ([place]) 07/15/2013  . Syncope 09/15/2012  . Hypokalemia 09/15/2012  . Chronic back pain   . Chronic urinary tract infection   . Hypertension   . Arthritis   . Synovial cyst of lumbar facet joint 11/01/2011    Barbara Rangel 04/02/2016, 1:25 PM  [place], [place], [address] Phone: [phone]   Fax:  [phone]  Name: TRENIA TENNYSON MRN: [id number] Date of Birth: 28-Dec-1945    Melvenia Needles, PTA 04/02/2016 1:25 PM Phone: [phone] Fax: [phone]

## 2016-04-02 NOTE — Patient Instructions (Signed)
   PELVIC TILT  Lie on back, legs bent. Exhale, tilting top of pelvis back, pubic bone up, to flatten lower back. Inhale, rolling pelvis opposite way, top forward, pubic bone down, arch in back. Repeat __10__ times. Do __2__ sessions per day. Copyright  VHI. All rights reserved.    Isometric Hold With Pelvic Floor (Hook-Lying)  Lie with hips and knees bent. Slowly inhale, and then exhale. Pull navel toward spine and tighten pelvic floor. Hold for __10_ seconds. Continue to breathe in and out during hold. Rest for _10__ seconds. Repeat __10_ times. Do __2-3_ times a day.   Knee Fold  Lie on back, legs bent, arms by sides. Exhale, lifting knee to chest. Inhale, returning. Keep abdominals flat, navel to spine. Repeat __10__ times, alternating legs. Do __2__ sessions per day.  Knee Drop  Keep pelvis stable. Without rotating hips, slowly drop knee to side, pause, return to center, bring knee across midline toward opposite hip. Feel obliques engaging. Repeat for ___10_ times each leg.   Copyright  VHI. All rights reserved.       Heel Slide to Straight   Slide one leg down to straight. Return. Be sure pelvis does not rock forward, tilt, rotate, or tip to side. Do _10__ times. Restabilize pelvis. Repeat with other leg. Do __1-2_ sets, __2_ times per day.  [email or web address].exer.[email or web address]   Copyright  VHI. All rights reserved.  Make sure to stop when your form fails

## 2016-04-04 ENCOUNTER — Encounter: Payer: Self-pay | Admitting: Physical Therapy

## 2016-04-04 ENCOUNTER — Other Ambulatory Visit: Payer: Self-pay | Admitting: Oncology

## 2016-04-04 ENCOUNTER — Ambulatory Visit: Payer: No Typology Code available for payment source | Admitting: Physical Therapy

## 2016-04-04 DIAGNOSIS — C50911 Malignant neoplasm of unspecified site of right female breast: Secondary | ICD-10-CM

## 2016-04-04 DIAGNOSIS — M6281 Muscle weakness (generalized): Secondary | ICD-10-CM

## 2016-04-04 DIAGNOSIS — M545 Low back pain: Secondary | ICD-10-CM

## 2016-04-04 NOTE — Therapy (Signed)
[place], [place], [address] Phone: [phone]   Fax:  [phone]  Physical Therapy Treatment  Patient Details  Name: Barbara Rangel MRN: [id number] Date of Birth: July 03, 1946 No Data Recorded  Encounter Date: 04/04/2016      PT End of Session - 04/04/16 1324    Visit Number 7   Number of Visits 13   Date for PT Re-Evaluation 04/25/16   PT Start Time 4010   PT Stop Time 0941   PT Time Calculation (min) 54 min      Past Medical History  Diagnosis Date  . PONV (postoperative nausea and vomiting)   . Other and unspecified general anesthetics causing adverse effect in therapeutic use     hard to wake up  . Chronic back pain     right radicular leg pain  . Constipation     r/t pain meds and takes Dulcolax nightly  . FHx: colonic polyps     hx of and mother had colon cancer  . Chronic urinary tract infection     takes Macrodantin and Cranberry daily  . Glaucoma     uses Xalantan nightly  . Cancer ([place])     hx breast cancer-right  . Hypertension     takes Amlodipine daily  . Arthritis     degenerative arthritis    Past Surgical History  Procedure Laterality Date  . Tubal ligation  1980  . Eye surgery  2004    right eye-detached retina  . Cataract surgery  2005    right eye  . Right breast biopsy  2005  . Breast lumpectomy      x2  . Left breast biopsy  2009  . Ankle surgery  2011    left ankle with screws and plates  . Colonoscopy    . Retinal detachment surgery  2004  . Mastectomy  2005    right  d/t breast cancer;no sticks to right arm LYMPH NODES REMOVED.  . Lumbar laminectomy/decompression microdiscectomy  10/31/2011    Procedure: LUMBAR LAMINECTOMY/DECOMPRESSION MICRODISCECTOMY;  Surgeon: Dahlia Bailiff;  Location: [facility];  Service: Orthopedics;  Laterality: Right;  L4-5 Decompression with Right Facet Decompression   . Colonoscopy with propofol N/A 10/11/2014    Procedure:  COLONOSCOPY WITH PROPOFOL;  Surgeon: Garlan Fair, MD;  [facility];  Service: Endoscopy;  Laterality: N/A;    There were no vitals filed for this visit.      Subjective Assessment - 04/04/16 0902    Subjective Pt reports back feels a little worse just after therapy and then feels better after a few days when it can relax. Feels as though her muscles are getting stronger and reports decreasing cramping but continues to have burning and itching pain at low back that is unchanged.    How long can you sit comfortably? always sits on personal pillow; 1 hour    How long can you stand comfortably? 1 hour "maybe"   Currently in Pain? Yes   Pain Score 4    Pain Location Back   Pain Orientation Lower;Mid   Pain Descriptors / Indicators Burning   Aggravating Factors  sitting without cushion, unknown motions create sharp pains   Pain Relieving Factors using cushion                         [facility] Adult PT Treatment/Exercise - 04/04/16 0001    Lumbar Exercises: Stretches  Single Knee to Chest Stretch Other (comment)  10x5 s ea   Double Knee to Chest Stretch Limitations x15 BLE on physioball   Lumbar Exercises: Standing   Other Standing Lumbar Exercises standing with elbows propped on table 3x30s   Moist Heat Therapy   Number Minutes Moist Heat 10 Minutes   Moist Heat Location Lumbar Spine   Manual Therapy   Manual therapy comments MET pelvic correction, man post rotation L innominate, RLE LA distraction                PT Education - 04/04/16 1120    Education provided Yes   Education Details lack of progression in PT, discussion regarding possible use of lumbar traction, return to surgeon, exercise form/rationale   Person(s) Educated Patient   Methods Explanation;Demonstration;Tactile cues;Verbal cues   Comprehension Verbalized understanding;Returned demonstration;Verbal cues required;Tactile cues required;Need further instruction          PT  Short Term Goals - 04/04/16 0907    PT SHORT TERM GOAL #1   Title Average pain <3/10 by 5/25   Baseline 4/10   Time 3   Period Weeks   Status Not Met   PT SHORT TERM GOAL #2   Title Independent in HEP as it has been created to this point by 5/25   Time 3   Period Weeks   Status Achieved   PT SHORT TERM GOAL #3   Title Able to demonstrate appropriate isolation of glut contraction and hip extension MMT to 4-/5   Baseline L 4-/5, R 3+/5   Time 3   Period Weeks   Status Partially Met           PT Long Term Goals - 03/14/16 1320    PT LONG TERM GOAL #1   Title Able to lay on either side without greater trochanter bursa pain by 6/15   Baseline unable d/t pain   Time 6   Period Weeks   Status New   PT LONG TERM GOAL #2   Title Pt will be able to sit for 20 min without back supported pain <=2/10   Time 6   Period Weeks   Status New   PT LONG TERM GOAL #3   Title BERG to 62% ability   Baseline 50% at eval   Time 6   Period Weeks   Status New   PT LONG TERM GOAL #4   Title Able to perform [place] with LBP <=2/10   Baseline quite difficult at eval   Time 6   Period Weeks   Status New               Plan - 04/04/16 1122    Clinical Impression Statement Post R/ anterior L pelvic innominate rotation noted again today and approached manually as well as with MET but pt was unable to say if her pain changed. Attempted flexion bias program (due to spondylolisthesis noted in MRI impression) but pt was apprehensive with motions regardless of education and verbalized increased pain in bilat SIJ when laying supine. Verbalized improvements and centralization when standing with elbows resting on table today.  Pt is going to call for appointment in neuro surgeon today.    Clinical Impairments Affecting Rehab Potential LBP prior to accident that was treated and mostly resolved in PT.    PT Treatment/Interventions ADLs/Self Care Home Management;Cryotherapy;Electrical  Stimulation;Ultrasound;Traction;Moist Heat;Iontophoresis 16m/ml Dexamethasone;Gait training;Stair training;Functional mobility training;Therapeutic activities;Therapeutic exercise;Balance training;Patient/family education;Neuromuscular re-education;Manual techniques;Taping;Dry needling;Passive range of motion   PT  Next Visit Plan determine changes in pain, possible hold on PT and wait for MD visit if no change   PT Home Exercise Plan standing with elbows propped on table.    Consulted and Agree with Plan of Care Patient      Patient will benefit from skilled therapeutic intervention in order to improve the following deficits and impairments:  Pain, Postural dysfunction, Increased muscle spasms, Decreased coordination, Decreased range of motion, Decreased strength  Visit Diagnosis: Bilateral low back pain, with sciatica presence unspecified  Muscle weakness (generalized)     Problem List Patient Active Problem List   Diagnosis Date Noted  . Malignant neoplasm of right female breast ([place]) 01/23/2016  . History of chemotherapy 01/23/2016  . High risk medication use 07/29/2015  . Screening mammogram for high-risk patient 07/29/2015  . Estrogen deficiency 07/29/2015  . 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/24/2015  . Dense breast tissue 01/24/2015  . Hx of adenomatous colonic polyps 01/24/2015  . Plantar fasciitis, bilateral 01/24/2015  . Breast cancer, right breast ([place]) 07/15/2013  . Syncope 09/15/2012  . Hypokalemia 09/15/2012  . Chronic back pain   . Chronic urinary tract infection   . Hypertension   . Arthritis   . Synovial cyst of lumbar facet joint 11/01/2011   Leonetta Mcgivern C. Camden Mazzaferro PT, DPT 04/04/2016 11:34 AM   [facility] [address] [place], [place], [address] Phone: [phone]   Fax:  [phone]  Name: Barbara Rangel MRN: [id number] Date of Birth: 29-Nov-1945

## 2016-04-09 ENCOUNTER — Encounter: Payer: Medicare Other | Admitting: Neurology

## 2016-04-09 ENCOUNTER — Ambulatory Visit: Payer: No Typology Code available for payment source | Admitting: Physical Therapy

## 2016-04-09 DIAGNOSIS — M542 Cervicalgia: Secondary | ICD-10-CM | POA: Diagnosis not present

## 2016-04-09 DIAGNOSIS — M545 Low back pain: Secondary | ICD-10-CM | POA: Diagnosis not present

## 2016-04-10 ENCOUNTER — Encounter: Payer: Medicare Other | Admitting: Neurology

## 2016-04-11 ENCOUNTER — Encounter: Payer: Self-pay | Admitting: Physical Therapy

## 2016-04-11 ENCOUNTER — Ambulatory Visit: Payer: Medicare Other | Attending: Internal Medicine | Admitting: Physical Therapy

## 2016-04-11 DIAGNOSIS — M6281 Muscle weakness (generalized): Secondary | ICD-10-CM

## 2016-04-11 DIAGNOSIS — M545 Low back pain: Secondary | ICD-10-CM | POA: Diagnosis not present

## 2016-04-11 DIAGNOSIS — R413 Other amnesia: Secondary | ICD-10-CM | POA: Insufficient documentation

## 2016-04-11 NOTE — Therapy (Signed)
[place], [place], [address] Phone: [phone]   Fax:  [phone]  Physical Therapy Treatment  Patient Details  Name: Barbara Rangel MRN: [id number] Date of Birth: 12-Apr-1946 No Data Recorded  Encounter Date: 04/11/2016      PT End of Session - 04/11/16 0840    Visit Number 8   Number of Visits 13   Date for PT Re-Evaluation 04/25/16   PT Start Time 0840   PT Stop Time 0940   PT Time Calculation (min) 60 min   Activity Tolerance Patient tolerated treatment well   Behavior During Therapy [facility] for tasks assessed/performed      Past Medical History  Diagnosis Date  . PONV (postoperative nausea and vomiting)   . Other and unspecified general anesthetics causing adverse effect in therapeutic use     hard to wake up  . Chronic back pain     right radicular leg pain  . Constipation     r/t pain meds and takes Dulcolax nightly  . FHx: colonic polyps     hx of and mother had colon cancer  . Chronic urinary tract infection     takes Macrodantin and Cranberry daily  . Glaucoma     uses Xalantan nightly  . Cancer ([place])     hx breast cancer-right  . Hypertension     takes Amlodipine daily  . Arthritis     degenerative arthritis    Past Surgical History  Procedure Laterality Date  . Tubal ligation  1980  . Eye surgery  2004    right eye-detached retina  . Cataract surgery  2005    right eye  . Right breast biopsy  2005  . Breast lumpectomy      x2  . Left breast biopsy  2009  . Ankle surgery  2011    left ankle with screws and plates  . Colonoscopy    . Retinal detachment surgery  2004  . Mastectomy  2005    right  d/t breast cancer;no sticks to right arm LYMPH NODES REMOVED.  . Lumbar laminectomy/decompression microdiscectomy  10/31/2011    Procedure: LUMBAR LAMINECTOMY/DECOMPRESSION MICRODISCECTOMY;  Surgeon: Dahlia Bailiff;  Location: [facility];  Service: Orthopedics;  Laterality:  Right;  L4-5 Decompression with Right Facet Decompression   . Colonoscopy with propofol N/A 10/11/2014    Procedure: COLONOSCOPY WITH PROPOFOL;  Surgeon: Garlan Fair, MD;  [facility];  Service: Endoscopy;  Laterality: N/A;    There were no vitals filed for this visit.      Subjective Assessment - 04/11/16 0842    Subjective Right now back is feeling okay. More burning when getting into car and driving   How long can you sit comfortably? always sits on personal pillow; 1 hour    How long can you stand comfortably? 1 hour "maybe"   How long can you walk comfortably? not a problem   Currently in Pain? Yes   Pain Score 4    Pain Location Back   Pain Orientation Lower   Pain Descriptors / Indicators Burning   Aggravating Factors  sitting without cushion, driving, sharp pains randomly   Pain Relieving Factors sitting on cushion, modalities                         [facility] Adult PT Treatment/Exercise - 04/11/16 0001    Lumbar Exercises: Stretches   Single Knee to Chest Stretch  Other (comment)  x15 ea 3s holds   Double Knee to Chest Stretch Limitations x15 BLE on physioball   Lower Trunk Rotation Limitations x5 ea   Lumbar Exercises: Standing   Other Standing Lumbar Exercises marches with elbows on table x15 ea   Other Standing Lumbar Exercises standing pelvic tilt at counter x20, standing press RTB 2x10   Lumbar Exercises: Supine   Large Ball Abdominal Isometric 15 reps;3 seconds   Knee/Hip Exercises: Stretches   Other Knee/Hip Stretches seated flexion PB roll out x20   Knee/Hip Exercises: Supine   Straight Leg Raises 15 reps   Straight Leg Raises Limitations heel propped on small bolster in beginning   Other Supine Knee/Hip Exercises hooklying ADD with ball x15 5s   Moist Heat Therapy   Number Minutes Moist Heat 10 Minutes   Moist Heat Location Lumbar Spine                PT Education - 04/11/16 0938    Education provided Yes   Education  Details flexion bias program, exercise form/ratioanle   Person(s) Educated Patient   Methods Explanation;Demonstration;Tactile cues;Verbal cues;Handout   Comprehension Verbalized understanding;Returned demonstration;Verbal cues required;Tactile cues required;Need further instruction          PT Short Term Goals - 04/04/16 0907    PT SHORT TERM GOAL #1   Title Average pain <3/10 by 5/25   Baseline 4/10   Time 3   Period Weeks   Status Not Met   PT SHORT TERM GOAL #2   Title Independent in HEP as it has been created to this point by 5/25   Time 3   Period Weeks   Status Achieved   PT SHORT TERM GOAL #3   Title Able to demonstrate appropriate isolation of glut contraction and hip extension MMT to 4-/5   Baseline L 4-/5, R 3+/5   Time 3   Period Weeks   Status Partially Met           PT Long Term Goals - 03/14/16 1320    PT LONG TERM GOAL #1   Title Able to lay on either side without greater trochanter bursa pain by 6/15   Baseline unable d/t pain   Time 6   Period Weeks   Status New   PT LONG TERM GOAL #2   Title Pt will be able to sit for 20 min without back supported pain <=2/10   Time 6   Period Weeks   Status New   PT LONG TERM GOAL #3   Title BERG to 62% ability   Baseline 50% at eval   Time 6   Period Weeks   Status New   PT LONG TERM GOAL #4   Title Able to perform [place] with LBP <=2/10   Baseline quite difficult at eval   Time 6   Period Weeks   Status New               Plan - 04/11/16 5956    Clinical Impression Statement Flexion based program per MD order. (Dr Duane Lope D. Brooks).  No c/o increased pain with prgram today. Pt was provided with HEP for flexion bias and will progress as tol.    Clinical Impairments Affecting Rehab Potential LBP prior to accident that was treated and mostly resolved in PT.    PT Treatment/Interventions ADLs/Self Care Home Management;Cryotherapy;Electrical Stimulation;Ultrasound;Traction;Moist Heat;Iontophoresis  57m/ml Dexamethasone;Gait training;Stair training;Functional mobility training;Therapeutic activities;Therapeutic exercise;Balance training;Patient/family education;Neuromuscular re-education;Manual techniques;Taping;Dry needling;Passive range of  motion   PT Next Visit Plan flexion bias program   PT Home Exercise Plan standing with elbows propped on table, DKTC, PPT   Consulted and Agree with Plan of Care Patient      Patient will benefit from skilled therapeutic intervention in order to improve the following deficits and impairments:  Pain, Postural dysfunction, Increased muscle spasms, Decreased coordination, Decreased range of motion, Decreased strength  Visit Diagnosis: Bilateral low back pain, with sciatica presence unspecified  Muscle weakness (generalized)     Problem List Patient Active Problem List   Diagnosis Date Noted  . Malignant neoplasm of right female breast ([place]) 01/23/2016  . History of chemotherapy 01/23/2016  . High risk medication use 07/29/2015  . Screening mammogram for high-risk patient 07/29/2015  . Estrogen deficiency 07/29/2015  . 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/24/2015  . Dense breast tissue 01/24/2015  . Hx of adenomatous colonic polyps 01/24/2015  . Plantar fasciitis, bilateral 01/24/2015  . Breast cancer, right breast ([place]) 07/15/2013  . Syncope 09/15/2012  . Hypokalemia 09/15/2012  . Chronic back pain   . Chronic urinary tract infection   . Hypertension   . Arthritis   . Synovial cyst of lumbar facet joint 11/01/2011   Alaiya Martindelcampo C. Anaira Seay PT, DPT 04/11/2016 9:45 AM   [facility] [address] [place], [place], [address] Phone: [phone]   Fax:  [phone]  Name: MESHA SCHAMBERGER MRN: [id number] Date of Birth: [birth year]

## 2016-04-12 DIAGNOSIS — H66003 Acute suppurative otitis media without spontaneous rupture of ear drum, bilateral: Secondary | ICD-10-CM | POA: Diagnosis not present

## 2016-04-12 DIAGNOSIS — J0111 Acute recurrent frontal sinusitis: Secondary | ICD-10-CM | POA: Diagnosis not present

## 2016-04-17 ENCOUNTER — Telehealth: Payer: Self-pay | Admitting: Neurology

## 2016-04-17 NOTE — Telephone Encounter (Signed)
Sent medical records to [facility]

## 2016-04-19 DIAGNOSIS — H66003 Acute suppurative otitis media without spontaneous rupture of ear drum, bilateral: Secondary | ICD-10-CM | POA: Diagnosis not present

## 2016-04-19 DIAGNOSIS — J0111 Acute recurrent frontal sinusitis: Secondary | ICD-10-CM | POA: Diagnosis not present

## 2016-04-23 ENCOUNTER — Encounter: Payer: Medicare Other | Admitting: Physical Therapy

## 2016-04-25 ENCOUNTER — Encounter: Payer: Medicare Other | Admitting: Physical Therapy

## 2016-04-30 ENCOUNTER — Ambulatory Visit: Payer: Medicare Other | Admitting: Physical Therapy

## 2016-04-30 DIAGNOSIS — M545 Low back pain: Secondary | ICD-10-CM

## 2016-04-30 DIAGNOSIS — M6281 Muscle weakness (generalized): Secondary | ICD-10-CM

## 2016-04-30 DIAGNOSIS — R413 Other amnesia: Secondary | ICD-10-CM | POA: Diagnosis not present

## 2016-04-30 NOTE — Therapy (Signed)
[place], [place], [address] Phone: [phone]   Fax:  [phone]  Physical Therapy Treatment  Patient Details  Name: Barbara Rangel MRN: [id number] Date of Birth: 12/04/1945 Referring Provider: Duane Lope D. Rolena Infante, MD  Encounter Date: 04/30/2016      PT End of Session - 04/30/16 0929    Visit Number 9   Number of Visits 21   Date for PT Re-Evaluation 06/14/16   PT Start Time 7340   PT Stop Time 0939   PT Time Calculation (min) 52 min   Activity Tolerance Patient tolerated treatment well   Behavior During Therapy [facility] for tasks assessed/performed      Past Medical History  Diagnosis Date  . PONV (postoperative nausea and vomiting)   . Other and unspecified general anesthetics causing adverse effect in therapeutic use     hard to wake up  . Chronic back pain     right radicular leg pain  . Constipation     r/t pain meds and takes Dulcolax nightly  . FHx: colonic polyps     hx of and mother had colon cancer  . Chronic urinary tract infection     takes Macrodantin and Cranberry daily  . Glaucoma     uses Xalantan nightly  . Cancer ([place])     hx breast cancer-right  . Hypertension     takes Amlodipine daily  . Arthritis     degenerative arthritis    Past Surgical History  Procedure Laterality Date  . Tubal ligation  1980  . Eye surgery  2004    right eye-detached retina  . Cataract surgery  2005    right eye  . Right breast biopsy  2005  . Breast lumpectomy      x2  . Left breast biopsy  2009  . Ankle surgery  2011    left ankle with screws and plates  . Colonoscopy    . Retinal detachment surgery  2004  . Mastectomy  2005    right  d/t breast cancer;no sticks to right arm LYMPH NODES REMOVED.  . Lumbar laminectomy/decompression microdiscectomy  10/31/2011    Procedure: LUMBAR LAMINECTOMY/DECOMPRESSION MICRODISCECTOMY;  Surgeon: Dahlia Bailiff;  Location: [facility];  Service:  Orthopedics;  Laterality: Right;  L4-5 Decompression with Right Facet Decompression   . Colonoscopy with propofol N/A 10/11/2014    Procedure: COLONOSCOPY WITH PROPOFOL;  Surgeon: Garlan Fair, MD;  [facility];  Service: Endoscopy;  Laterality: N/A;    There were no vitals filed for this visit.      Subjective Assessment - 04/30/16 0848    Subjective Pt reports resolution of burning, is feeling better and can see some progress going. Continues to feel weakness and less severe LBP   How long can you sit comfortably? always sits on personal pillow; 1 hour    How long can you stand comfortably? maybe 1 hour   Currently in Pain? Yes   Pain Score 6    Pain Location Back   Pain Orientation Lower   Pain Descriptors / Indicators Aching   Pain Type Chronic pain   Aggravating Factors  sitting without cushion, sitting/standing too long, gets worse in the afternoon   Pain Relieving Factors rest, sitting on cushion            [facility] PT Assessment - 04/30/16 0001    Assessment   Medical Diagnosis Lumbar and cervical pain, flexion bias program  Referring Provider Dahari D. Rolena Infante, MD   Observation/Other Assessments   Focus on Therapeutic Outcomes (FOTO)  51% impaired   Strength   Right Hip Flexion 4+/5   Right Hip Extension 4-/5   Right Hip ABduction 3+/5   Left Hip Flexion 4+/5   Left Hip Extension 4/5   Left Hip ABduction 3/5   Right Knee Flexion 5/5   Right Knee Extension 4+/5   Left Knee Flexion 5/5   Left Knee Extension 4+/5                     [facility] Adult PT Treatment/Exercise - 04/30/16 0001    Lumbar Exercises: Stretches   Single Knee to Chest Stretch Other (comment)  x15 ea 3s holds   Lumbar Exercises: Supine   Bent Knee Raise 15 reps   Large Ball Abdominal Isometric 3 seconds;20 reps   Knee/Hip Exercises: Seated   Other Seated Knee/Hip Exercises seated pelvic tilts with core control/posture cues   Knee/Hip Exercises: Supine   Straight Leg  Raises 15 reps;Both   Knee/Hip Exercises: Sidelying   Clams 30 ea   Moist Heat Therapy   Number Minutes Moist Heat 10 Minutes   Moist Heat Location Lumbar Spine                PT Education - 04/30/16 0922    Education provided Yes   Education Details progression toward goals, flexion bias program, exercise form/rationale, HEP   Person(s) Educated Patient   Methods Explanation;Demonstration;Tactile cues;Verbal cues;Handout   Comprehension Verbalized understanding;Returned demonstration;Verbal cues required;Tactile cues required;Need further instruction          PT Short Term Goals - 04/04/16 0907    PT SHORT TERM GOAL #1   Title Average pain <3/10 by 5/25   Baseline 4/10   Time 3   Period Weeks   Status Not Met   PT SHORT TERM GOAL #2   Title Independent in HEP as it has been created to this point by 5/25   Time 3   Period Weeks   Status Achieved   PT SHORT TERM GOAL #3   Title Able to demonstrate appropriate isolation of glut contraction and hip extension MMT to 4-/5   Baseline L 4-/5, R 3+/5   Time 3   Period Weeks   Status Partially Met           PT Long Term Goals - 04/30/16 8110    PT LONG TERM GOAL #1   Title Able to lay on either side without greater trochanter bursa pain by 6/15   Baseline is able to sleep on sides, discomfort may be more pronounced on harder surface   Status Achieved   PT LONG TERM GOAL #2   Title Pt will be able to sit for 20 min without back supported pain <=2/10   Baseline up to 6/10   Time 6   Period Weeks   Status On-going   PT LONG TERM GOAL #3   Title BERG to 62% ability   Baseline BERG 50/56 =89% ability   Status Achieved   PT LONG TERM GOAL #4   Title Able to perform [place] with LBP <=2/10   Baseline 4-5/10   Time 6   Period Weeks   Status On-going               Plan - 04/30/16 3159    Clinical Impression Statement Re-evaluation of strength and goals today demonstrated improving strength and reports of  improvements in pain (resolution of burning pain). Pt is no longer fall risk according to BERG balance scale. Pt will benefit from skilled PT in order to continue progressing with flexion-bias program to further progress toward pain and functional LTG.    Rehab Potential Fair   PT Frequency 2x / week   PT Duration 6 weeks   PT Treatment/Interventions ADLs/Self Care Home Management;Cryotherapy;Electrical Stimulation;Ultrasound;Traction;Moist Heat;Iontophoresis 25m/ml Dexamethasone;Gait training;Stair training;Functional mobility training;Therapeutic activities;Therapeutic exercise;Balance training;Patient/family education;Neuromuscular re-education;Manual techniques;Taping;Dry needling;Passive range of motion   PT Next Visit Plan flexion bias program   PT Home Exercise Plan pelvic tilt (supine, seated, standing), clams, supine SLR   Consulted and Agree with Plan of Care Patient      Patient will benefit from skilled therapeutic intervention in order to improve the following deficits and impairments:  Pain, Postural dysfunction, Increased muscle spasms, Decreased coordination, Decreased range of motion, Decreased strength, Improper body mechanics, Decreased activity tolerance  Visit Diagnosis: Bilateral low back pain, with sciatica presence unspecified - Plan: PT plan of care cert/re-cert  Muscle weakness (generalized) - Plan: PT plan of care cert/re-cert       G-Codes - 006/30/20171001    Functional Assessment Tool Used FOTO 51% disability, clinical judgement   Functional Limitation Mobility: Walking and moving around   Mobility: Walking and Moving Around Current Status [phone] At least 40 percent but less than 60 percent impaired, limited or restricted   Mobility: Walking and Moving Around Goal Status [phone] At least 20 percent but less than 40 percent impaired, limited or restricted      Problem List Patient Active Problem List   Diagnosis Date Noted  . [place] 01/23[place]2016  . History of chemotherapy 01/23/2016  . High risk medication use 07/29/2015  . Screening mammogram for high-risk patient 07/29/2015  . Estrogen deficiency 07/29/2015  . 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/24/2015  . Dense breast tissue 01/24/2015  . Hx of adenomatous colonic polyps 01/24/2015  . Plantar fasciitis, bilateral 01/24/2015  . [place] 07/15[place]2013  . Syncope 09/15/2012  . Hypokalemia 09/15/2012  . Chronic back pain   . Chronic urinary tract infection   . Hypertension   . Arthritis   . Synovial cyst of lumbar facet joint 11/01/2011   Ji Fairburn C. Roselin Wiemann PT, DPT 006/30/1710:08 AM   [facility] [address]Phone: [phone]  Fax:  [phone] Name: Barbara WIMMERMRN: [id number] of Birth: 409/20/1947

## 2016-05-02 ENCOUNTER — Encounter: Payer: Self-pay | Admitting: Physical Therapy

## 2016-05-02 ENCOUNTER — Ambulatory Visit: Payer: Medicare Other | Admitting: Physical Therapy

## 2016-05-02 DIAGNOSIS — R413 Other amnesia: Secondary | ICD-10-CM | POA: Diagnosis not present

## 2016-05-02 DIAGNOSIS — M545 Low back pain: Secondary | ICD-10-CM | POA: Diagnosis not present

## 2016-05-02 DIAGNOSIS — M6281 Muscle weakness (generalized): Secondary | ICD-10-CM

## 2016-05-02 NOTE — Therapy (Signed)
[place], [place], [address] Phone: [phone]   Fax:  [phone]  Physical Therapy Treatment  Patient Details  Name: Barbara Rangel MRN: [id number] Date of Birth: 01-01-1946 Referring Provider: Duane Lope D. Rolena Infante, MD  Encounter Date: 05/02/2016      PT End of Session - 05/02/16 0853    Visit Number 10   Number of Visits 21   Date for PT Re-Evaluation 06/14/16   PT Start Time 0845   PT Stop Time 0942   PT Time Calculation (min) 57 min   Activity Tolerance Patient tolerated treatment well   Behavior During Therapy [facility] for tasks assessed/performed      Past Medical History  Diagnosis Date  . PONV (postoperative nausea and vomiting)   . Other and unspecified general anesthetics causing adverse effect in therapeutic use     hard to wake up  . Chronic back pain     right radicular leg pain  . Constipation     r/t pain meds and takes Dulcolax nightly  . FHx: colonic polyps     hx of and mother had colon cancer  . Chronic urinary tract infection     takes Macrodantin and Cranberry daily  . Glaucoma     uses Xalantan nightly  . Cancer ([place])     hx breast cancer-right  . Hypertension     takes Amlodipine daily  . Arthritis     degenerative arthritis    Past Surgical History  Procedure Laterality Date  . Tubal ligation  1980  . Eye surgery  2004    right eye-detached retina  . Cataract surgery  2005    right eye  . Right breast biopsy  2005  . Breast lumpectomy      x2  . Left breast biopsy  2009  . Ankle surgery  2011    left ankle with screws and plates  . Colonoscopy    . Retinal detachment surgery  2004  . Mastectomy  2005    right  d/t breast cancer;no sticks to right arm LYMPH NODES REMOVED.  . Lumbar laminectomy/decompression microdiscectomy  10/31/2011    Procedure: LUMBAR LAMINECTOMY/DECOMPRESSION MICRODISCECTOMY;  Surgeon: Dahlia Bailiff;  Location: [facility];  Service:  Orthopedics;  Laterality: Right;  L4-5 Decompression with Right Facet Decompression   . Colonoscopy with propofol N/A 10/11/2014    Procedure: COLONOSCOPY WITH PROPOFOL;  Surgeon: Garlan Fair, MD;  [facility];  Service: Endoscopy;  Laterality: N/A;    There were no vitals filed for this visit.      Subjective Assessment - 05/02/16 0849    Subjective pt reports feeling more pain in bilateral hips that is tender to touch at bilateral greater trochanters. pain in lower back is reduced.   Currently in Pain? Yes   Pain Score 3    Pain Location Back   Pain Orientation Lower   Pain Descriptors / Indicators Aching                         [facility] Adult PT Treatment/Exercise - 05/02/16 0001    Lumbar Exercises: Stretches   Single Knee to Chest Stretch Other (comment)  10x3s ea   Lower Trunk Rotation Limitations x10 ea   Lumbar Exercises: Supine   Bent Knee Raise 10 reps   Bent Knee Raise Limitations bilat LE with ball between knees   Large Ball Abdominal Isometric 20 reps;5 seconds  Knee/Hip Exercises: Stretches   Piriformis Stretch 2 reps;30 seconds   Knee/Hip Exercises: Supine   Straight Leg Raises 15 reps;Both   Moist Heat Therapy   Number Minutes Moist Heat 10 Minutes   Moist Heat Location Hip  bilateral   Manual Therapy   Manual therapy comments roller bilateral ITB and VL, trigger point release, pt piriformis stretch after each                PT Education - 05/02/16 0852    Education provided Yes   Education Details exercise form/rationale   Person(s) Educated Patient   Methods Explanation;Demonstration;Tactile cues;Verbal cues   Comprehension Verbalized understanding;Returned demonstration;Verbal cues required;Tactile cues required;Need further instruction          PT Short Term Goals - 04/04/16 0907    PT SHORT TERM GOAL #1   Title Average pain <3/10 by 5/25   Baseline 4/10   Time 3   Period Weeks   Status Not Met   PT  SHORT TERM GOAL #2   Title Independent in HEP as it has been created to this point by 5/25   Time 3   Period Weeks   Status Achieved   PT SHORT TERM GOAL #3   Title Able to demonstrate appropriate isolation of glut contraction and hip extension MMT to 4-/5   Baseline L 4-/5, R 3+/5   Time 3   Period Weeks   Status Partially Met           PT Long Term Goals - 04/30/16 1025    PT LONG TERM GOAL #1   Title Able to lay on either side without greater trochanter bursa pain by 6/15   Baseline is able to sleep on sides, discomfort may be more pronounced on harder surface   Status Achieved   PT LONG TERM GOAL #2   Title Pt will be able to sit for 20 min without back supported pain <=2/10   Baseline up to 6/10   Time 6   Period Weeks   Status On-going   PT LONG TERM GOAL #3   Title BERG to 62% ability   Baseline BERG 50/56 =89% ability   Status Achieved   PT LONG TERM GOAL #4   Title Able to perform [place] with LBP <=2/10   Baseline 4-5/10   Time 6   Period Weeks   Status On-going               Plan - 05/02/16 0919    Clinical Impression Statement concordant pain recreated today by palpating bursa at greater trochanter, IT band and VL- decreased following treatment. Pt is making good progress in regards to pain and will continue to benefit from skilled PT to reach functional LTGs.    PT Next Visit Plan flexion bias program, ultrasound?   PT Home Exercise Plan pelvic tilt (supine, seated, standing), clams, supine SLR   Consulted and Agree with Plan of Care Patient      Patient will benefit from skilled therapeutic intervention in order to improve the following deficits and impairments:     Visit Diagnosis: Bilateral low back pain, with sciatica presence unspecified  Muscle weakness (generalized)     Problem List Patient Active Problem List   Diagnosis Date Noted  . Malignant neoplasm of right female breast ([place]) 01/23/2016  . History of chemotherapy 01/23/2016   . High risk medication use 07/29/2015  . Screening mammogram for high-risk patient 07/29/2015  . Estrogen deficiency 07/29/2015  .  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/24/2015  . Dense breast tissue 01/24/2015  . Hx of adenomatous colonic polyps 01/24/2015  . Plantar fasciitis, bilateral 01/24/2015  . Breast cancer, right breast ([place]) 07/15/2013  . Syncope 09/15/2012  . Hypokalemia 09/15/2012  . Chronic back pain   . Chronic urinary tract infection   . Hypertension   . Arthritis   . Synovial cyst of lumbar facet joint 11/01/2011   Elverda Wendel C. Kimiko Common PT, DPT 05/02/2016 10:36 AM   [facility] [address] [place], [place], [address] Phone: [phone]   Fax:  [phone]  Name: LORENA BENHAM MRN: [id number] Date of Birth: Dec 17, 1945

## 2016-05-07 ENCOUNTER — Ambulatory Visit: Payer: Medicare Other | Admitting: Physical Therapy

## 2016-05-07 DIAGNOSIS — M6281 Muscle weakness (generalized): Secondary | ICD-10-CM

## 2016-05-07 DIAGNOSIS — M545 Low back pain: Secondary | ICD-10-CM

## 2016-05-07 DIAGNOSIS — R413 Other amnesia: Secondary | ICD-10-CM | POA: Diagnosis not present

## 2016-05-07 NOTE — Therapy (Signed)
[place], [place], [address] Phone: [phone]   Fax:  [phone]  Physical Therapy Treatment  Patient Details  Name: Barbara Rangel MRN: [id number] Date of Birth: [birth year] Referring Provider: Duane Lope D. Rolena Infante, MD  Encounter Date: 05/07/2016      PT End of Session - 05/07/16 0905    Visit Number 11   Number of Visits 21   Date for PT Re-Evaluation 06/14/16   PT Start Time 0846   PT Stop Time 0940   PT Time Calculation (min) 54 min      Past Medical History  Diagnosis Date  . PONV (postoperative nausea and vomiting)   . Other and unspecified general anesthetics causing adverse effect in therapeutic use     hard to wake up  . Chronic back pain     right radicular leg pain  . Constipation     r/t pain meds and takes Dulcolax nightly  . FHx: colonic polyps     hx of and mother had colon cancer  . Chronic urinary tract infection     takes Macrodantin and Cranberry daily  . Glaucoma     uses Xalantan nightly  . Cancer ([place])     hx breast cancer-right  . Hypertension     takes Amlodipine daily  . Arthritis     degenerative arthritis    Past Surgical History  Procedure Laterality Date  . Tubal ligation  1980  . Eye surgery  2004    right eye-detached retina  . Cataract surgery  2005    right eye  . Right breast biopsy  2005  . Breast lumpectomy      x2  . Left breast biopsy  2009  . Ankle surgery  2011    left ankle with screws and plates  . Colonoscopy    . Retinal detachment surgery  2004  . Mastectomy  2005    right  d/t breast cancer;no sticks to right arm LYMPH NODES REMOVED.  . Lumbar laminectomy/decompression microdiscectomy  10/31/2011    Procedure: LUMBAR LAMINECTOMY/DECOMPRESSION MICRODISCECTOMY;  Surgeon: Dahlia Bailiff;  Location: [facility];  Service: Orthopedics;  Laterality: Right;  L4-5 Decompression with Right Facet Decompression   . Colonoscopy with propofol N/A  10/11/2014    Procedure: COLONOSCOPY WITH PROPOFOL;  Surgeon: Garlan Fair, MD;  [facility];  Service: Endoscopy;  Laterality: N/A;    There were no vitals filed for this visit.      Subjective Assessment - 05/07/16 0848    Subjective pt reports she has not had any burning. Wakes sometimes due to hip aching to turn over. Denies day time discomfort.    Currently in Pain? No/denies                         [facility] Adult PT Treatment/Exercise - 05/07/16 0001    Lumbar Exercises: Stretches   Single Knee to Chest Stretch Other (comment)  10x5s ea   Lower Trunk Rotation Limitations x10 ea   Lumbar Exercises: Supine   Bent Knee Raise 10 reps   Large Ball Abdominal Isometric 20 reps;5 seconds   Knee/Hip Exercises: Stretches   Piriformis Stretch 2 reps;30 seconds   Knee/Hip Exercises: Supine   Straight Leg Raises 2 sets;10 reps;Both   Moist Heat Therapy   Number Minutes Moist Heat 10 Minutes   Moist Heat Location Hip  bilateral   Manual Therapy   Manual therapy  comments roller bilateral ITB and VL, trigger point release L glut med.                 PT Education - 05/07/16 0905    Education provided Yes   Education Details plan of care, exercise form/rationale, progressions, maintenance with flexion bias, HEP, mattress   Person(s) Educated Patient   Methods Explanation;Demonstration;Tactile cues;Verbal cues;Handout   Comprehension Verbalized understanding;Returned demonstration;Verbal cues required;Tactile cues required;Need further instruction          PT Short Term Goals - 04/04/16 0907    PT SHORT TERM GOAL #1   Title Average pain <3/10 by 5/25   Baseline 4/10   Time 3   Period Weeks   Status Not Met   PT SHORT TERM GOAL #2   Title Independent in HEP as it has been created to this point by 5/25   Time 3   Period Weeks   Status Achieved   PT SHORT TERM GOAL #3   Title Able to demonstrate appropriate isolation of glut contraction  and hip extension MMT to 4-/5   Baseline L 4-/5, R 3+/5   Time 3   Period Weeks   Status Partially Met           PT Long Term Goals - 04/30/16 9563    PT LONG TERM GOAL #1   Title Able to lay on either side without greater trochanter bursa pain by 6/15   Baseline is able to sleep on sides, discomfort may be more pronounced on harder surface   Status Achieved   PT LONG TERM GOAL #2   Title Pt will be able to sit for 20 min without back supported pain <=2/10   Baseline up to 6/10   Time 6   Period Weeks   Status On-going   PT LONG TERM GOAL #3   Title BERG to 62% ability   Baseline BERG 50/56 =89% ability   Status Achieved   PT LONG TERM GOAL #4   Title Able to perform [place] with LBP <=2/10   Baseline 4-5/10   Time 6   Period Weeks   Status On-going               Plan - 05/07/16 0906    Clinical Impression Statement Pt has made significant improvements in pain levels but cont to have discomfort at night. Discussed sleeping in another bed in her home to determine input from mattress.    PT Next Visit Plan review effects of different mattress, flexion bias   PT Home Exercise Plan pelvic tilt (supine, seated, standing), clams, supine SLR   Consulted and Agree with Plan of Care Patient      Patient will benefit from skilled therapeutic intervention in order to improve the following deficits and impairments:     Visit Diagnosis: Bilateral low back pain, with sciatica presence unspecified  Muscle weakness (generalized)     Problem List Patient Active Problem List   Diagnosis Date Noted  . Malignant neoplasm of right female breast ([place]) 01/23/2016  . History of chemotherapy 01/23/2016  . High risk medication use 07/29/2015  . Screening mammogram for high-risk patient 07/29/2015  . Estrogen deficiency 07/29/2015  . 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/24/2015  . Dense breast tissue 01/24/2015  . Hx of adenomatous colonic polyps 01/24/2015  . Plantar  fasciitis, bilateral 01/24/2015  . Breast cancer, right breast ([place]) 07/15/2013  . Syncope 09/15/2012  . Hypokalemia 09/15/2012  . Chronic back pain   .  Chronic urinary tract infection   . Hypertension   . Arthritis   . Synovial cyst of lumbar facet joint 11/01/2011    Tujuana Kilmartin C. Claryce Friel PT, DPT 05/07/2016 9:39 AM   [facility] [address] [place], [place], [address] Phone: [phone]   Fax:  [phone]  Name: Barbara Rangel MRN: [id number] Date of Birth: 07/07/1946

## 2016-05-09 ENCOUNTER — Encounter: Payer: Self-pay | Admitting: Psychology

## 2016-05-09 ENCOUNTER — Ambulatory Visit (INDEPENDENT_AMBULATORY_CARE_PROVIDER_SITE_OTHER): Payer: Medicare Other | Admitting: Psychology

## 2016-05-09 ENCOUNTER — Ambulatory Visit: Payer: Medicare Other | Admitting: Physical Therapy

## 2016-05-09 ENCOUNTER — Encounter: Payer: Self-pay | Admitting: Physical Therapy

## 2016-05-09 DIAGNOSIS — M6281 Muscle weakness (generalized): Secondary | ICD-10-CM | POA: Diagnosis not present

## 2016-05-09 DIAGNOSIS — R413 Other amnesia: Secondary | ICD-10-CM

## 2016-05-09 DIAGNOSIS — M545 Low back pain: Secondary | ICD-10-CM | POA: Diagnosis not present

## 2016-05-09 NOTE — Therapy (Signed)
[place], [place], [address] Phone: [phone]   Fax:  [phone]  Physical Therapy Treatment  Patient Details  Name: Barbara Rangel MRN: [id number] Date of Birth: 12-22-1945 Referring Provider: Duane Lope D. Rolena Infante, MD  Encounter Date: 05/09/2016      PT End of Session - 05/09/16 1342    Visit Number 12   Number of Visits 21   Date for PT Re-Evaluation 06/14/16   PT Start Time 1332   PT Stop Time 1425   PT Time Calculation (min) 53 min   Activity Tolerance Patient tolerated treatment well   Behavior During Therapy [facility] for tasks assessed/performed      Past Medical History  Diagnosis Date  . PONV (postoperative nausea and vomiting)   . Other and unspecified general anesthetics causing adverse effect in therapeutic use     hard to wake up  . Chronic back pain     right radicular leg pain  . Constipation     r/t pain meds and takes Dulcolax nightly  . FHx: colonic polyps     hx of and mother had colon cancer  . Chronic urinary tract infection     takes Macrodantin and Cranberry daily  . Glaucoma     uses Xalantan nightly  . Cancer ([place])     hx breast cancer-right  . Hypertension     takes Amlodipine daily  . Arthritis     degenerative arthritis    Past Surgical History  Procedure Laterality Date  . Tubal ligation  1980  . Eye surgery  2004    right eye-detached retina  . Cataract surgery  2005    right eye  . Right breast biopsy  2005  . Breast lumpectomy      x2  . Left breast biopsy  2009  . Ankle surgery  2011    left ankle with screws and plates  . Colonoscopy    . Retinal detachment surgery  2004  . Mastectomy  2005    right  d/t breast cancer;no sticks to right arm LYMPH NODES REMOVED.  . Lumbar laminectomy/decompression microdiscectomy  10/31/2011    Procedure: LUMBAR LAMINECTOMY/DECOMPRESSION MICRODISCECTOMY;  Surgeon: Dahlia Bailiff;  Location: [facility];  Service:  Orthopedics;  Laterality: Right;  L4-5 Decompression with Right Facet Decompression   . Colonoscopy with propofol N/A 10/11/2014    Procedure: COLONOSCOPY WITH PROPOFOL;  Surgeon: Garlan Fair, MD;  [facility];  Service: Endoscopy;  Laterality: N/A;    There were no vitals filed for this visit.      Subjective Assessment - 05/09/16 1334    Subjective Yesterday began having random spurts of "scratchy" pain that begins in her lower back and travels up to upper back. No longer really feeling burning sensation. Did exercises after pain began to show but was not sure if it changed the frequency of the pain.    Currently in Pain? Yes   Pain Score 6   when the "scratchy" tingling hits   Pain Location Back   Pain Descriptors / Indicators --  scratchy                         [facility] Adult PT Treatment/Exercise - 05/09/16 0001    Lumbar Exercises: Stretches   Single Knee to Chest Stretch Other (comment)  10x5s ea   Double Knee to Chest Stretch Limitations x15 BLE on physioball   Lower Trunk  Rotation Limitations x10 ea   Quadruped Mid Back Stretch Limitations seated physioball roll out   Lumbar Exercises: Supine   AB Set Limitations seated pelvic tilts   Bent Knee Raise 10 reps;5 seconds   Large Ball Abdominal Isometric 20 reps;5 seconds   Other Supine Lumbar Exercises isometric HS curl over PB 10x5s   Knee/Hip Exercises: Stretches   Piriformis Stretch 2 reps;30 seconds   Knee/Hip Exercises: Supine   Hip Adduction Isometric 20 reps  5s   Straight Leg Raises 2 sets;10 reps;Both                PT Education - 05/09/16 1341    Education provided Yes   Education Details exercise form/rationale   Person(s) Educated Patient   Methods Explanation;Demonstration;Tactile cues;Verbal cues   Comprehension Verbalized understanding;Returned demonstration;Verbal cues required;Tactile cues required;Need further instruction          PT Short Term Goals -  04/04/16 0907    PT SHORT TERM GOAL #1   Title Average pain <3/10 by 5/25   Baseline 4/10   Time 3   Period Weeks   Status Not Met   PT SHORT TERM GOAL #2   Title Independent in HEP as it has been created to this point by 5/25   Time 3   Period Weeks   Status Achieved   PT SHORT TERM GOAL #3   Title Able to demonstrate appropriate isolation of glut contraction and hip extension MMT to 4-/5   Baseline L 4-/5, R 3+/5   Time 3   Period Weeks   Status Partially Met           PT Long Term Goals - 04/30/16 2595    PT LONG TERM GOAL #1   Title Able to lay on either side without greater trochanter bursa pain by 6/15   Baseline is able to sleep on sides, discomfort may be more pronounced on harder surface   Status Achieved   PT LONG TERM GOAL #2   Title Pt will be able to sit for 20 min without back supported pain <=2/10   Baseline up to 6/10   Time 6   Period Weeks   Status On-going   PT LONG TERM GOAL #3   Title BERG to 62% ability   Baseline BERG 50/56 =89% ability   Status Achieved   PT LONG TERM GOAL #4   Title Able to perform [place] with LBP <=2/10   Baseline 4-5/10   Time 6   Period Weeks   Status On-going               Plan - 05/09/16 1513    Clinical Impression Statement Pt denied increase in pain today except with seated physioball roll outs. Pt has tendency to extend through lumbar spine in a seated position so we reviewed posture with pelvic tilt in seated and pt was educated on importance of avoiding extension due to stenosis.    PT Next Visit Plan review effects of different mattress, flexion bias   PT Home Exercise Plan pelvic tilt (supine, seated, standing), clams, supine SLR   Consulted and Agree with Plan of Care Patient      Patient will benefit from skilled therapeutic intervention in order to improve the following deficits and impairments:     Visit Diagnosis: Bilateral low back pain, with sciatica presence unspecified  Muscle weakness  (generalized)     Problem List Patient Active Problem List   Diagnosis Date Noted  .  Malignant neoplasm of right female breast ([place]) 01/23/2016  . History of chemotherapy 01/23/2016  . High risk medication use 07/29/2015  . Screening mammogram for high-risk patient 07/29/2015  . Estrogen deficiency 07/29/2015  . 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/24/2015  . Dense breast tissue 01/24/2015  . Hx of adenomatous colonic polyps 01/24/2015  . Plantar fasciitis, bilateral 01/24/2015  . Breast cancer, right breast ([place]) 07/15/2013  . Syncope 09/15/2012  . Hypokalemia 09/15/2012  . Chronic back pain   . Chronic urinary tract infection   . Hypertension   . Arthritis   . Synovial cyst of lumbar facet joint 11/01/2011    Yumi Insalaco C. Deon Ivey PT, DPT 05/09/2016 3:16 PM   [facility] [address] [place], [place], [address] Phone: [phone]   Fax:  [phone]  Name: Barbara Rangel MRN: [id number] Date of Birth: [birth year]

## 2016-05-09 NOTE — Progress Notes (Signed)
[facility]  [address]   Telephone [phone] [address] Fax [phone] [place], [place] [address]  Initial Contact Note  Name:  Barbara Rangel Date of Birth; June 29, 1946 MRN:  [id number] Date:  05/09/2016  Barbara Rangel is an 70 y.o. female who was referred for a repeat neuropsychological evaluation by Star Age, MD due to [name]'s persisting complaints of cognitive difficulties.   A total of 5 hours was spent today reviewing medical records, interviewing (CPT [phone]) [name] and administering and scoring neurocognitive tests (CPT [phone] & [phone]).  Preliminary Diagnostic Impression: Memory complaints [R41.3]  There were no concerns expressed or behaviors displayed by [name] that would require immediate attention.   A full report will follow once the planned testing has been completed. Her next appointment is scheduled for 05/20/16.   Jamey Ripa, Ph.D Licensed Psychologist 05/09/2016

## 2016-05-10 DIAGNOSIS — H2512 Age-related nuclear cataract, left eye: Secondary | ICD-10-CM | POA: Diagnosis not present

## 2016-05-10 DIAGNOSIS — H401131 Primary open-angle glaucoma, bilateral, mild stage: Secondary | ICD-10-CM | POA: Diagnosis not present

## 2016-05-13 ENCOUNTER — Encounter: Payer: Self-pay | Admitting: Physical Therapy

## 2016-05-13 ENCOUNTER — Ambulatory Visit: Payer: Medicare Other | Attending: Internal Medicine | Admitting: Physical Therapy

## 2016-05-13 DIAGNOSIS — M545 Low back pain: Secondary | ICD-10-CM | POA: Diagnosis not present

## 2016-05-13 DIAGNOSIS — R413 Other amnesia: Secondary | ICD-10-CM | POA: Diagnosis not present

## 2016-05-13 DIAGNOSIS — M6281 Muscle weakness (generalized): Secondary | ICD-10-CM | POA: Insufficient documentation

## 2016-05-13 NOTE — Therapy (Signed)
[place], [place], [address] Phone: [phone]   Fax:  [phone]  Physical Therapy Treatment  Patient Details  Name: Barbara Rangel MRN: [id number] Date of Birth: 08/08/1946 Referring Provider: Duane Lope D. Rolena Infante, MD  Encounter Date: 05/13/2016      PT End of Session - 05/13/16 0853    Visit Number 13   Number of Visits 21   Date for PT Re-Evaluation 06/14/16   PT Start Time 0800   PT Stop Time 0900   PT Time Calculation (min) 60 min   Activity Tolerance Patient tolerated treatment well   Behavior During Therapy [facility] for tasks assessed/performed      Past Medical History  Diagnosis Date  . PONV (postoperative nausea and vomiting)   . Other and unspecified general anesthetics causing adverse effect in therapeutic use     hard to wake up  . Chronic back pain     right radicular leg pain  . Constipation     r/t pain meds and takes Dulcolax nightly  . FHx: colonic polyps     hx of and mother had colon cancer  . Chronic urinary tract infection     takes Macrodantin and Cranberry daily  . Glaucoma     uses Xalantan nightly  . Cancer ([place])     hx breast cancer-right  . Hypertension     takes Amlodipine daily  . Arthritis     degenerative arthritis    Past Surgical History  Procedure Laterality Date  . Tubal ligation  1980  . Eye surgery  2004    right eye-detached retina  . Cataract surgery  2005    right eye  . Right breast biopsy  2005  . Breast lumpectomy      x2  . Left breast biopsy  2009  . Ankle surgery  2011    left ankle with screws and plates  . Colonoscopy    . Retinal detachment surgery  2004  . Mastectomy  2005    right  d/t breast cancer;no sticks to right arm LYMPH NODES REMOVED.  . Lumbar laminectomy/decompression microdiscectomy  10/31/2011    Procedure: LUMBAR LAMINECTOMY/DECOMPRESSION MICRODISCECTOMY;  Surgeon: Dahlia Bailiff;  Location: [place];  Service:  Orthopedics;  Laterality: Right;  L4-5 Decompression with Right Facet Decompression   . Colonoscopy with propofol N/A 10/11/2014    Procedure: COLONOSCOPY WITH PROPOFOL;  Surgeon: Garlan Fair, MD;  Location: [facility];  Service: Endoscopy;  Laterality: N/A;    There were no vitals filed for this visit.      Subjective Assessment - 05/13/16 0759    Subjective Pt reports feeling a little of the concordant pain that faded away over the weekend with doing exercises. Feels a little tightness in lower back this morning.    Currently in Pain? Yes   Pain Score 3    Pain Location Back   Pain Orientation Lower   Aggravating Factors  seated for an extended period of time                         [facility] Adult PT Treatment/Exercise - 05/13/16 0001    Lumbar Exercises: Stretches   Single Knee to Chest Stretch Limitations seated with pelvic tilt, education for posture   Pelvic Tilt Other (comment)  with education regarding posture   Quadruped Mid Back Stretch Limitations seated trunk flexin   Knee/Hip Exercises: Seated   Sit  to [employer] 10 reps  education for posture and muscular recruitment   Moist Heat Therapy   Number Minutes Moist Heat 10 Minutes   Moist Heat Location Lumbar Spine                PT Education - 05/13/16 0852    Education provided Yes   Education Details exercise form/rationale-specifically for car trip that is coming up   Person(s) Educated Patient   Methods Explanation;Demonstration;Tactile cues;Verbal cues;Handout   Comprehension Verbalized understanding;Returned demonstration;Verbal cues required;Tactile cues required;Need further instruction          PT Short Term Goals - 04/04/16 0907    PT SHORT TERM GOAL #1   Title Average pain <3/10 by 5/25   Baseline 4/10   Time 3   Period Weeks   Status Not Met   PT SHORT TERM GOAL #2   Title Independent in HEP as it has been created to this point by 5/25   Time 3   Period Weeks   Status  Achieved   PT SHORT TERM GOAL #3   Title Able to demonstrate appropriate isolation of glut contraction and hip extension MMT to 4-/5   Baseline L 4-/5, R 3+/5   Time 3   Period Weeks   Status Partially Met           PT Long Term Goals - 04/30/16 6237    PT LONG TERM GOAL #1   Title Able to lay on either side without greater trochanter bursa pain by 6/15   Baseline is able to sleep on sides, discomfort may be more pronounced on harder surface   Status Achieved   PT LONG TERM GOAL #2   Title Pt will be able to sit for 20 min without back supported pain <=2/10   Baseline up to 6/10   Time 6   Period Weeks   Status On-going   PT LONG TERM GOAL #3   Title BERG to 62% ability   Baseline BERG 50/56 =89% ability   Status Achieved   PT LONG TERM GOAL #4   Title Able to perform [place] with LBP <=2/10   Baseline 4-5/10   Time 6   Period Weeks   Status On-going               Plan - 05/13/16 0853    Clinical Impression Statement Pt is going to be traveling in the car later this month and is very nervous about back pain returning. Held discussion regarding moving toward independence with exercises and matinenance of a progressive condition (stenosis). All exercises today were catered toward car ride and psoture while in seated position. Was educated on importance of getting out of the car to move around frequently.    PT Next Visit Plan will return once next week prior to trip, returning week of the 24th for follow up after trip followed by d/c   PT Home Exercise Plan pelvic tilt (supine, seated, standing), clams, supine SLR   Consulted and Agree with Plan of Care Patient      Patient will benefit from skilled therapeutic intervention in order to improve the following deficits and impairments:     Visit Diagnosis: Bilateral low back pain, with sciatica presence unspecified  Muscle weakness (generalized)     Problem List Patient Active Problem List   Diagnosis Date Noted   . Malignant neoplasm of right female breast ([place]) 01/23/2016  . History of chemotherapy 01/23/2016  . High risk medication use  07/29/2015  . Screening mammogram for high-risk patient 07/29/2015  . Estrogen deficiency 07/29/2015  . 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/24/2015  . Dense breast tissue 01/24/2015  . Hx of adenomatous colonic polyps 01/24/2015  . Plantar fasciitis, bilateral 01/24/2015  . Breast cancer, right breast ([place]) 07/15/2013  . Syncope 09/15/2012  . Hypokalemia 09/15/2012  . Chronic back pain   . Chronic urinary tract infection   . Hypertension   . Arthritis   . Synovial cyst of lumbar facet joint 11/01/2011   Analyse Angst C. Dema Timmons PT, DPT 05/13/2016 8:57 AM   [facility] [address] [place], [place], [address] Phone: [phone]   Fax:  [phone]  Name: Barbara Rangel MRN: [id number] Date of Birth: September 19, 1946

## 2016-05-16 ENCOUNTER — Ambulatory Visit: Payer: Medicare Other | Admitting: Physical Therapy

## 2016-05-16 ENCOUNTER — Encounter: Payer: Self-pay | Admitting: Physical Therapy

## 2016-05-16 DIAGNOSIS — M6281 Muscle weakness (generalized): Secondary | ICD-10-CM | POA: Diagnosis not present

## 2016-05-16 DIAGNOSIS — R413 Other amnesia: Secondary | ICD-10-CM | POA: Diagnosis not present

## 2016-05-16 DIAGNOSIS — M545 Low back pain: Secondary | ICD-10-CM | POA: Diagnosis not present

## 2016-05-16 NOTE — Patient Instructions (Signed)
Access Code: [email or web address]  URL: [email or web address]  Date: 05/16/2016  Prepared by: Selinda Eon   Exercises  Standing Scapular Retraction  Standing Shoulder Row with Anchored Resistance - 10 reps - 3 sets - 5 hold - 1x daily - 5x weekly  Shoulder External Rotation and Scapular Retraction with Resistance - 10 reps - 3 sets - 5 hold - 1x daily - 5x weekly

## 2016-05-16 NOTE — Therapy (Signed)
Barbara Rangel, [place], [address] Phone: [phone]   Fax:  [phone]  Physical Therapy Treatment  Patient Details  Name: Barbara Rangel MRN: [id number] Date of Birth: 07-21-1946 Referring Provider: Duane Lope D. Rolena Infante, MD  Encounter Date: 05/16/2016      PT End of Session - 05/16/16 0851    Visit Number 14   Number of Visits 21   Date for PT Re-Evaluation 06/14/16   PT Start Time 0846   PT Stop Time 0938   PT Time Calculation (min) 52 min   Activity Tolerance Patient tolerated treatment well   Behavior During Therapy [facility] for tasks assessed/performed      Past Medical History  Diagnosis Date  . PONV (postoperative nausea and vomiting)   . Other and unspecified general anesthetics causing adverse effect in therapeutic use     hard to wake up  . Chronic back pain     right radicular leg pain  . Constipation     r/t pain meds and takes Dulcolax nightly  . FHx: colonic polyps     hx of and mother had colon cancer  . Chronic urinary tract infection     takes Macrodantin and Cranberry daily  . Glaucoma     uses Xalantan nightly  . Cancer ([place])     hx breast cancer-right  . Hypertension     takes Amlodipine daily  . Arthritis     degenerative arthritis    Past Surgical History  Procedure Laterality Date  . Tubal ligation  1980  . Eye surgery  2004    right eye-detached retina  . Cataract surgery  2005    right eye  . Right breast biopsy  2005  . Breast lumpectomy      x2  . Left breast biopsy  2009  . Ankle surgery  2011    left ankle with screws and plates  . Colonoscopy    . Retinal detachment surgery  2004  . Mastectomy  2005    right  d/t breast cancer;no sticks to right arm LYMPH NODES REMOVED.  . Lumbar laminectomy/decompression microdiscectomy  10/31/2011    Procedure: LUMBAR LAMINECTOMY/DECOMPRESSION MICRODISCECTOMY;  Surgeon: Dahlia Bailiff;  Location: [place];  Service:  Orthopedics;  Laterality: Right;  L4-5 Decompression with Right Facet Decompression   . Colonoscopy with propofol N/A 10/11/2014    Procedure: COLONOSCOPY WITH PROPOFOL;  Surgeon: Garlan Fair, MD;  [facility];  Service: Endoscopy;  Laterality: N/A;    There were no vitals filed for this visit.      Subjective Assessment - 05/16/16 0848    Subjective Pt reports back is feeling pretty good today, tried sleeping on another mattress but did not feel any change.    Currently in Pain? Yes   Pain Score 4    Pain Location Back   Pain Orientation Lower   Pain Descriptors / Indicators Burning;Tingling   Pain Type Chronic pain                         [facility] Adult PT Treatment/Exercise - 05/16/16 0001    Lumbar Exercises: Stretches   Single Knee to Chest Stretch Limitations 5x5s ea   Lumbar Exercises: Standing   Row 20 reps   Theraband Level (Row) Level 2 (Red)   Other Standing Lumbar Exercises Shoulder external rotation   Other Standing Lumbar Exercises scapular retractions   Lumbar Exercises: Supine  Ab Set 10 reps   Bent Knee Raise 10 reps;5 seconds   Bent Knee Raise Limitations bilateral LE to match hold 5s x5   Moist Heat Therapy   Number Minutes Moist Heat 10 Minutes   Moist Heat Location Lumbar Spine                PT Education - 05/16/16 0850    Education provided Yes   Education Details exercise form/rationale, muscle ache with weakness   Person(s) Educated Patient   Methods Explanation;Demonstration;Tactile cues;Verbal cues;Handout   Comprehension Verbalized understanding;Returned demonstration;Verbal cues required;Tactile cues required;Need further instruction          PT Short Term Goals - 04/04/16 0907    PT SHORT TERM GOAL #1   Title Average pain <3/10 by 5/25   Baseline 4/10   Time 3   Period Weeks   Status Not Met   PT SHORT TERM GOAL #2   Title Independent in HEP as it has been created to this point by 5/25   Time  3   Period Weeks   Status Achieved   PT SHORT TERM GOAL #3   Title Able to demonstrate appropriate isolation of glut contraction and hip extension MMT to 4-/5   Baseline L 4-/5, R 3+/5   Time 3   Period Weeks   Status Partially Met           PT Long Term Goals - 04/30/16 0254    PT LONG TERM GOAL #1   Title Able to lay on either side without greater trochanter bursa pain by 6/15   Baseline is able to sleep on sides, discomfort may be more pronounced on harder surface   Status Achieved   PT LONG TERM GOAL #2   Title Pt will be able to sit for 20 min without back supported pain <=2/10   Baseline up to 6/10   Time 6   Period Weeks   Status On-going   PT LONG TERM GOAL #3   Title BERG to 62% ability   Baseline BERG 50/56 =89% ability   Status Achieved   PT LONG TERM GOAL #4   Title Able to perform [place] with LBP <=2/10   Baseline 4-5/10   Time 6   Period Weeks   Status On-going               Plan - 05/16/16 1021    Clinical Impression Statement Difficulty with controling BLE in supine core exercises. Heavy cuing requried for performance of exercises to strengthen periscapular region.    PT Next Visit Plan will return once next week prior to trip, returning week of the 24th for follow up after trip followed by d/c   Consulted and Agree with Plan of Care Patient      Patient will benefit from skilled therapeutic intervention in order to improve the following deficits and impairments:     Visit Diagnosis: Bilateral low back pain, with sciatica presence unspecified  Muscle weakness (generalized)     Problem List Patient Active Problem List   Diagnosis Date Noted  . Malignant neoplasm of right female breast ([place]) 01/23/2016  . History of chemotherapy 01/23/2016  . High risk medication use 07/29/2015  . Screening mammogram for high-risk patient 07/29/2015  . Estrogen deficiency 07/29/2015  . 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/24/2015  . Dense breast tissue  01/24/2015  . Hx of adenomatous colonic polyps 01/24/2015  . Plantar fasciitis, bilateral 01/24/2015  . Breast cancer, right  breast ([place]) 07/15/2013  . Syncope 09/15/2012  . Hypokalemia 09/15/2012  . Chronic back pain   . Chronic urinary tract infection   . Hypertension   . Arthritis   . Synovial cyst of lumbar facet joint 11/01/2011    Trisa Cranor C. Tejah Brekke PT, DPT 05/16/2016 10:24 AM   [facility] [address] [place], [place], [address] Phone: [phone]   Fax:  [phone]  Name: Barbara Rangel MRN: [id number] Date of Birth: 03/21/1946

## 2016-05-20 ENCOUNTER — Encounter: Payer: Self-pay | Admitting: Psychology

## 2016-05-20 ENCOUNTER — Ambulatory Visit (INDEPENDENT_AMBULATORY_CARE_PROVIDER_SITE_OTHER): Payer: Medicare Other | Admitting: Psychology

## 2016-05-20 DIAGNOSIS — R413 Other amnesia: Secondary | ICD-10-CM | POA: Diagnosis not present
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
## 2016-05-21 ENCOUNTER — Encounter: Payer: Medicare Other | Admitting: Physical Therapy

## 2016-05-23 ENCOUNTER — Encounter: Payer: Self-pay | Admitting: Physical Therapy

## 2016-05-23 ENCOUNTER — Ambulatory Visit: Payer: Medicare Other | Admitting: Physical Therapy

## 2016-05-23 DIAGNOSIS — R413 Other amnesia: Secondary | ICD-10-CM | POA: Diagnosis not present

## 2016-05-23 DIAGNOSIS — M6281 Muscle weakness (generalized): Secondary | ICD-10-CM | POA: Diagnosis not present

## 2016-05-23 DIAGNOSIS — M545 Low back pain: Secondary | ICD-10-CM | POA: Diagnosis not present

## 2016-05-23 NOTE — Therapy (Signed)
[place], [place], [address] Phone: [phone]   Fax:  [phone]  Physical Therapy Treatment  Patient Details  Name: Barbara Rangel MRN: [id number] Date of Birth: 06-13-1946 Referring Provider: Duane Lope D. Rolena Infante, MD  Encounter Date: 05/23/2016      PT End of Session - 05/23/16 1429    Visit Number 15   Number of Visits 21   Date for PT Re-Evaluation 06/14/16   PT Start Time 8250   PT Stop Time 1506   PT Time Calculation (min) 51 min   Activity Tolerance Patient tolerated treatment well   Behavior During Therapy [facility] for tasks assessed/performed      Past Medical History  Diagnosis Date  . PONV (postoperative nausea and vomiting)   . Other and unspecified general anesthetics causing adverse effect in therapeutic use     hard to wake up  . Chronic back pain     right radicular leg pain  . Constipation     r/t pain meds and takes Dulcolax nightly  . FHx: colonic polyps     hx of and mother had colon cancer  . Chronic urinary tract infection     takes Macrodantin and Cranberry daily  . Glaucoma     uses Xalantan nightly  . Cancer ([place])     hx breast cancer-right  . Hypertension     takes Amlodipine daily  . Arthritis     degenerative arthritis    Past Surgical History  Procedure Laterality Date  . Tubal ligation  1980  . Eye surgery  2004    right eye-detached retina  . Cataract surgery  2005    right eye  . Right breast biopsy  2005  . Breast lumpectomy      x2  . Left breast biopsy  2009  . Ankle surgery  2011    left ankle with screws and plates  . Colonoscopy    . Retinal detachment surgery  2004  . Mastectomy  2005    right  d/t breast cancer;no sticks to right arm LYMPH NODES REMOVED.  . Lumbar laminectomy/decompression microdiscectomy  10/31/2011    Procedure: LUMBAR LAMINECTOMY/DECOMPRESSION MICRODISCECTOMY;  Surgeon: Dahlia Bailiff;  Location: [facility];  Service:  Orthopedics;  Laterality: Right;  L4-5 Decompression with Right Facet Decompression   . Colonoscopy with propofol N/A 10/11/2014    Procedure: COLONOSCOPY WITH PROPOFOL;  Surgeon: Garlan Fair, MD;  Location: [facility];  Service: Endoscopy;  Laterality: N/A;    There were no vitals filed for this visit.      Subjective Assessment - 05/23/16 1417    Subjective Exercsises at home are going well and is beginning to notice a difference.    How long can you sit comfortably? always sits on personal pillow; 1 hour   most notable in the car   Currently in Pain? Yes   Pain Score 3    Pain Location Back   Pain Orientation Lower   Pain Descriptors / Indicators Aching   Pain Type Chronic pain   Aggravating Factors  sitting in the car   Pain Relieving Factors exercises            [facility] PT Assessment - 05/23/16 0001    Observation/Other Assessments   Focus on Therapeutic Outcomes (FOTO)  46% impairment   AROM   Lumbar Flexion 110   Lumbar Extension --  will not test d/t stenosis   Strength   Right  Hip Flexion 5/5   Right Hip Extension 4/5   Right Hip ABduction 4/5   Left Hip Flexion 5/5   Left Hip Extension 4/5   Left Hip ABduction 4-/5   Right Knee Extension 5/5   Left Knee Extension 5/5                     [facility] Adult PT Treatment/Exercise - 05/23/16 0001    Lumbar Exercises: Stretches   Single Knee to Chest Stretch Limitations 10x5s ea   Lumbar Exercises: Standing   Row 10 reps   Theraband Level (Row) Level 2 (Red)   Other Standing Lumbar Exercises Shoulder external rotation red tband  slight trunk flexion   Lumbar Exercises: Supine   Bent Knee Raise 10 reps;5 seconds   Bent Knee Raise Limitations bilat LE to match hold   Other Supine Lumbar Exercises isometric HS curl over PB 10x10s   Knee/Hip Exercises: Seated   Other Seated Knee/Hip Exercises seated pelvic tilt with UE resisted LE abd   Sit to Sand 10 reps  cues for core and glut control   Moist  Heat Therapy   Number Minutes Moist Heat 10 Minutes   Moist Heat Location Lumbar Spine                  PT Short Term Goals - 04/04/16 0907    PT SHORT TERM GOAL #1   Title Average pain <3/10 by 5/25   Baseline 4/10   Time 3   Period Weeks   Status Not Met   PT SHORT TERM GOAL #2   Title Independent in HEP as it has been created to this point by 5/25   Time 3   Period Weeks   Status Achieved   PT SHORT TERM GOAL #3   Title Able to demonstrate appropriate isolation of glut contraction and hip extension MMT to 4-/5   Baseline L 4-/5, R 3+/5   Time 3   Period Weeks   Status Partially Met           PT Long Term Goals - 04/30/16 0814    PT LONG TERM GOAL #1   Title Able to lay on either side without greater trochanter bursa pain by 6/15   Baseline is able to sleep on sides, discomfort may be more pronounced on harder surface   Status Achieved   PT LONG TERM GOAL #2   Title Pt will be able to sit for 20 min without back supported pain <=2/10   Baseline up to 6/10   Time 6   Period Weeks   Status On-going   PT LONG TERM GOAL #3   Title BERG to 62% ability   Baseline BERG 50/56 =89% ability   Status Achieved   PT LONG TERM GOAL #4   Title Able to perform [place] with LBP <=2/10   Baseline 4-5/10   Time 6   Period Weeks   Status On-going               Plan - 05/23/16 1433    Clinical Impression Statement Pt has made good progress with strength and is beginning to notice positive changes in function since plan has been altered to focus on flexion bias exercises. Pt cont to have discomfort in back that limits ability to sit in car and complete household chores and held discussion regarding life-long management.    PT Next Visit Plan will return once next week prior to trip, returning  week of the 24th for follow up after trip followed by d/c   Consulted and Agree with Plan of Care Patient      Patient will benefit from skilled therapeutic intervention in  order to improve the following deficits and impairments:     Visit Diagnosis: Bilateral low back pain, with sciatica presence unspecified  Muscle weakness (generalized)       G-Codes - May 31, 2016 1459    Functional Assessment Tool Used FOTO 46% disability   Functional Limitation Mobility: Walking and moving around   Mobility: Walking and Moving Around Current Status [phone]) At least 40 percent but less than 60 percent impaired, limited or restricted   Mobility: Walking and Moving Around Goal Status [phone]) At least 20 percent but less than 40 percent impaired, limited or restricted      Problem List Patient Active Problem List   Diagnosis Date Noted  . Malignant neoplasm of right female breast ([place]) 01/23/2016  . History of chemotherapy 01/23/2016  . High risk medication use 07/29/2015  . Screening mammogram for high-risk patient 07/29/2015  . Estrogen deficiency 07/29/2015  . 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/24/2015  . Dense breast tissue 01/24/2015  . Hx of adenomatous colonic polyps 01/24/2015  . Plantar fasciitis, bilateral 01/24/2015  . Breast cancer, right breast ([place]) 07/15/2013  . Syncope 09/15/2012  . Hypokalemia 09/15/2012  . Chronic back pain   . Chronic urinary tract infection   . Hypertension   . Arthritis   . Synovial cyst of lumbar facet joint 11/01/2011     C.  PT, DPT 05/31/16 3:05 PM   [facility] [address] [place], [place], [address] Phone: [phone]   Fax:  [phone]  Name: Barbara Rangel MRN: [id number] Date of Birth: 22-Aug-1946

## 2016-05-28 ENCOUNTER — Telehealth: Payer: Self-pay | Admitting: Neurology

## 2016-05-28 NOTE — Telephone Encounter (Signed)
error 
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
## 2016-05-28 NOTE — Telephone Encounter (Signed)
LM for patient to call back.
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
## 2016-05-28 NOTE — Telephone Encounter (Signed)
Not sure what to advise. Patient had MRI done but declined EMG/NCV testing.

## 2016-05-30 NOTE — Telephone Encounter (Signed)
Pt returned call. Please call cell [phone]

## 2016-05-30 NOTE — Telephone Encounter (Signed)
I called patient back and she is aware of information below.

## 2016-06-04 ENCOUNTER — Ambulatory Visit: Payer: Medicare Other | Admitting: Physical Therapy

## 2016-06-04 ENCOUNTER — Encounter: Payer: Self-pay | Admitting: Physical Therapy

## 2016-06-04 DIAGNOSIS — M6281 Muscle weakness (generalized): Secondary | ICD-10-CM | POA: Diagnosis not present

## 2016-06-04 DIAGNOSIS — M545 Low back pain: Secondary | ICD-10-CM

## 2016-06-04 DIAGNOSIS — R413 Other amnesia: Secondary | ICD-10-CM | POA: Diagnosis not present

## 2016-06-04 NOTE — Patient Instructions (Signed)
Access Code: [email or web address]  URL: [email or web address]  Date: 06/04/2016  Prepared by: Selinda Eon   Exercises  Seated Flexion Stretch  Sit to Stand  Seated Transversus Abdominis Bracing  Supine Transversus Abdominis Bracing - Hands on Thighs

## 2016-06-04 NOTE — Therapy (Signed)
[place], [place], [address] Phone: [phone]   Fax:  [phone]  Physical Therapy Treatment/Discharge Summary  Patient Details  Name: Barbara Rangel MRN: [id number] Date of Birth: Mar 01, 1946 Referring Provider: Duane Lope D. Rolena Infante, MD  Encounter Date: 06/04/2016      PT End of Session - 06/04/16 0820    Visit Number 16   Number of Visits 21   Date for PT Re-Evaluation 06/14/16   PT Start Time 0802   PT Stop Time 2671   PT Time Calculation (min) 53 min   Activity Tolerance Patient tolerated treatment well   Behavior During Therapy [facility] for tasks assessed/performed      Past Medical History:  Diagnosis Date  . Arthritis    degenerative arthritis  . Cancer ([place])    hx breast cancer-right  . Chronic back pain    right radicular leg pain  . Chronic urinary tract infection    takes Macrodantin and Cranberry daily  . Constipation    r/t pain meds and takes Dulcolax nightly  . FHx: colonic polyps    hx of and mother had colon cancer  . Glaucoma    uses Xalantan nightly  . Hypertension    takes Amlodipine daily  . Other and unspecified general anesthetics causing adverse effect in therapeutic use    hard to wake up  . PONV (postoperative nausea and vomiting)     Past Surgical History:  Procedure Laterality Date  . ANKLE SURGERY  2011   left ankle with screws and plates  . BREAST LUMPECTOMY     x2  . cataract surgery  2005   right eye  . COLONOSCOPY    . COLONOSCOPY WITH PROPOFOL N/A 10/11/2014   Procedure: COLONOSCOPY WITH PROPOFOL;  Surgeon: Garlan Fair, MD;  [facility];  Service: Endoscopy;  Laterality: N/A;  . EYE SURGERY  2004   right eye-detached retina  . left breast biopsy  2009  . LUMBAR LAMINECTOMY/DECOMPRESSION MICRODISCECTOMY  10/31/2011   Procedure: LUMBAR LAMINECTOMY/DECOMPRESSION MICRODISCECTOMY;  Surgeon: Dahlia Bailiff;  Location: [facility];  Service:  Orthopedics;  Laterality: Right;  L4-5 Decompression with Right Facet Decompression   . MASTECTOMY  2005   right  d/t breast cancer;no sticks to right arm LYMPH NODES REMOVED.  [facility] RETINAL DETACHMENT SURGERY  2004  . right breast biopsy  2005  . TUBAL LIGATION  1980    There were no vitals filed for this visit.      Subjective Assessment - 06/04/16 0803    Subjective Used a pillow behind upper back in car which seemed to help provide support.    Currently in Pain? Yes   Pain Score 3    Pain Location Back   Pain Orientation Lower   Pain Descriptors / Indicators Burning  tight   Pain Type Chronic pain   Aggravating Factors  sitting in car   Pain Relieving Factors exercises            [facility] PT Assessment - 06/04/16 0001      Strength   Right Hip Extension 4+/5   Right Hip ABduction 4+/5   Left Hip Extension 4+/5   Left Hip ABduction 4/5                     [facility] Adult PT Treatment/Exercise - 06/04/16 0001      Lumbar Exercises: Stretches   Single Knee to Chest Stretch Limitations 10x5s ea  Lumbar Exercises: Supine   Bent Knee Raise 10 reps;5 seconds   Bent Knee Raise Limitations bilat LE to match hold   Large Ball Abdominal Isometric Limitations legs on chair, no ball   Other Supine Lumbar Exercises isometric HS curl over PB 10x10s     Knee/Hip Exercises: Stretches   Piriformis Stretch 2 reps;30 seconds     Knee/Hip Exercises: Seated   Sit to Sand 10 reps     Moist Heat Therapy   Number Minutes Moist Heat 10 Minutes   Moist Heat Location Lumbar Spine                PT Education - 06-21-2016 0820    Education provided Yes   Education Details exercise form/rationale, HEP   Person(s) Educated Patient   Methods Explanation;Demonstration;Tactile cues;Verbal cues;Handout   Comprehension Verbalized understanding;Returned demonstration;Verbal cues required;Tactile cues required          PT Short Term Goals - 2016-06-21 0806      PT  SHORT TERM GOAL #1   Title Average pain <3/10 by 5/25   Status Achieved     PT SHORT TERM GOAL #2   Title Independent in HEP as it has been created to this point by 5/25   Status Achieved     PT SHORT TERM GOAL #3   Title Able to demonstrate appropriate isolation of glut contraction and hip extension MMT to 4-/5   Status Achieved           PT Long Term Goals - Jun 21, 2016 0806      PT LONG TERM GOAL #1   Title Able to lay on either side without greater trochanter bursa pain by 6/15   Status Achieved     PT LONG TERM GOAL #2   Title Pt will be able to sit for 20 min without back supported pain <=2/10   Status Not Met     PT LONG TERM GOAL #3   Title BERG to 62% ability   Status Achieved     PT LONG TERM GOAL #4   Title Able to perform [place] with LBP <=2/10   Baseline more weakness than pain, tiring   Status Achieved               Plan - 06-21-2016 1244    Clinical Impression Statement Pt has met her goals and verbalizes comfort and understanding with exercises. Pt will be d/c at this time and was instructed to contact us with any further questions.    Consulted and Agree with Plan of Care Patient      Patient will benefit from skilled therapeutic intervention in order to improve the following deficits and impairments:     Visit Diagnosis: Bilateral low back pain, with sciatica presence unspecified  Muscle weakness (generalized)       G-Codes - 06/21/16 1246    Functional Assessment Tool Used FOTO 46% disability   Functional Limitation Mobility: Walking and moving around   Mobility: Walking and Moving Around Current Status [phone]) At least 40 percent but less than 60 percent impaired, limited or restricted   Mobility: Walking and Moving Around Goal Status [phone]) At least 20 percent but less than 40 percent impaired, limited or restricted   Mobility: Walking and Moving Around Discharge Status [phone]) At least 40 percent but less than 60 percent impaired,  limited or restricted      Problem List Patient Active Problem List   Diagnosis Date Noted  . Malignant neoplasm of  right female breast ([place]) 01/23/2016  . History of chemotherapy 01/23/2016  . High risk medication use 07/29/2015  . Screening mammogram for high-risk patient 07/29/2015  . Estrogen deficiency 07/29/2015  . 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/24/2015  . Dense breast tissue 01/24/2015  . Hx of adenomatous colonic polyps 01/24/2015  . Plantar fasciitis, bilateral 01/24/2015  . Breast cancer, right breast ([place]) 07/15/2013  . Syncope 09/15/2012  . Hypokalemia 09/15/2012  . Chronic back pain   . Chronic urinary tract infection   . Hypertension   . Arthritis   . Synovial cyst of lumbar facet joint 11/01/2011   PHYSICAL THERAPY DISCHARGE SUMMARY  Visits from Start of Care: 16  Current functional level related to goals / functional outcomes: See above   Remaining deficits: See above   Education / Equipment: Exercise form/rationale, HEP, POC  Plan: Patient agrees to discharge.  Patient goals were met. Patient is being discharged due to meeting the stated rehab goals.  ?????       Mariselda Badalamenti C. Alaiyah Bollman PT, DPT 06/04/16 12:47 PM   [facility] [address] [place], [place], [address] Phone: [phone]   Fax:  [phone]  Name: Barbara Rangel MRN: [id number] Date of Birth: 05-12-1946

## 2016-06-06 ENCOUNTER — Encounter: Payer: Medicare Other | Admitting: Physical Therapy

## 2016-06-10 DIAGNOSIS — M542 Cervicalgia: Secondary | ICD-10-CM | POA: Diagnosis not present

## 2016-06-10 DIAGNOSIS — M545 Low back pain: Secondary | ICD-10-CM | POA: Diagnosis not present

## 2016-06-10 DIAGNOSIS — M7138 Other bursal cyst, other site: Secondary | ICD-10-CM | POA: Diagnosis not present

## 2016-06-11 DIAGNOSIS — S134XXS Sprain of ligaments of cervical spine, sequela: Secondary | ICD-10-CM | POA: Diagnosis not present

## 2016-06-11 DIAGNOSIS — M50821 Other cervical disc disorders at C4-C5 level: Secondary | ICD-10-CM | POA: Diagnosis not present

## 2016-06-11 DIAGNOSIS — M545 Low back pain: Secondary | ICD-10-CM | POA: Diagnosis not present

## 2016-06-11 DIAGNOSIS — M542 Cervicalgia: Secondary | ICD-10-CM | POA: Diagnosis not present

## 2016-06-18 ENCOUNTER — Encounter: Payer: Self-pay | Admitting: Physical Therapy

## 2016-06-18 ENCOUNTER — Ambulatory Visit: Payer: Medicare Other | Attending: Physical Medicine and Rehabilitation | Admitting: Physical Therapy

## 2016-06-18 DIAGNOSIS — M545 Low back pain: Secondary | ICD-10-CM | POA: Insufficient documentation

## 2016-06-18 DIAGNOSIS — M542 Cervicalgia: Secondary | ICD-10-CM

## 2016-06-18 DIAGNOSIS — M6281 Muscle weakness (generalized): Secondary | ICD-10-CM | POA: Insufficient documentation

## 2016-06-18 DIAGNOSIS — R413 Other amnesia: Secondary | ICD-10-CM | POA: Insufficient documentation

## 2016-06-18 NOTE — Patient Instructions (Signed)
Access Code: [email or web address]  URL: [email or web address]  Date: 06/18/2016  Prepared by: Selinda Eon   Exercises  Seated Cervical Retraction - 10 reps - 1 sets - 3 hold - 10x daily - 7x weekly  Isometric Shoulder Extension at Wall - 10 reps - 1 sets - 10 hold - 1x daily - 7x weekly

## 2016-06-18 NOTE — Therapy (Signed)
[place], [place], [address] Phone: [phone]   Fax:  [phone]  Physical Therapy Evaluation  Patient Details  Name: Barbara Rangel MRN: [id number] Date of Birth: August 04, 1946 Referring Provider: Janice Coffin. Nelva Bush, MD  Encounter Date: 06/18/2016      PT End of Session - 06/18/16 1019    Visit Number 1   Number of Visits 13   Date for PT Re-Evaluation 07/16/16  30d medicare re-eval   Authorization Type Medicare- Kx at visit 15   PT Start Time [id number]   PT Stop Time 1103   PT Time Calculation (min) 48 min   Activity Tolerance Patient tolerated treatment well   Behavior During Therapy WFL for tasks assessed/performed      Past Medical History:  Diagnosis Date  . Arthritis    degenerative arthritis  . Cancer ([place])    hx breast cancer-right  . Chronic back pain    right radicular leg pain  . Chronic urinary tract infection    takes Macrodantin and Cranberry daily  . Constipation    r/t pain meds and takes Dulcolax nightly  . FHx: colonic polyps    hx of and mother had colon cancer  . Glaucoma    uses Xalantan nightly  . Hypertension    takes Amlodipine daily  . Other and unspecified general anesthetics causing adverse effect in therapeutic use    hard to wake up  . PONV (postoperative nausea and vomiting)     Past Surgical History:  Procedure Laterality Date  . ANKLE SURGERY  2011   left ankle with screws and plates  . BREAST LUMPECTOMY     x2  . cataract surgery  2005   right eye  . COLONOSCOPY    . COLONOSCOPY WITH PROPOFOL N/A 10/11/2014   Procedure: COLONOSCOPY WITH PROPOFOL;  Surgeon: Garlan Fair, MD;  Location: [facility];  Service: Endoscopy;  Laterality: N/A;  . EYE SURGERY  2004   right eye-detached retina  . left breast biopsy  2009  . LUMBAR LAMINECTOMY/DECOMPRESSION MICRODISCECTOMY  10/31/2011   Procedure: LUMBAR LAMINECTOMY/DECOMPRESSION MICRODISCECTOMY;   Surgeon: Dahlia Bailiff;  Location: [place];  Service: Orthopedics;  Laterality: Right;  L4-5 Decompression with Right Facet Decompression   . MASTECTOMY  2005   right  d/t breast cancer;no sticks to right arm LYMPH NODES REMOVED.  Marland Kitchen RETINAL DETACHMENT SURGERY  2004  . right breast biopsy  2005  . TUBAL LIGATION  1980    There were no vitals filed for this visit.       Subjective Assessment - 06/18/16 1019    Subjective Occipital region, down neck and across bilateral shoulders has pain. Difficulty performing cervical rotation. Sometimes wakes due to neck pain, gets better during the day. Had neck pain in the past but occipital pain has begun since accident (02/01/16), "it's hard to describe"   Patient Stated Goals turning head to drive, sleep through the night   Currently in Pain? Yes   Pain Score 4   5/10 in last week at worst   Pain Location Neck   Pain Orientation Medial  suboccipital   Pain Type Chronic pain   Pain Frequency Intermittent   Aggravating Factors  turning head, waking in the morning   Pain Relieving Factors stretches and shoulder rolls            [facility] PT Assessment - 06/18/16 0001      Assessment   Medical Diagnosis  cervicalgia- see referral in media   Referring Provider Richard D. Nelva Bush, MD   Onset Date/Surgical Date 02/01/16   Hand Dominance Right   Next MD Visit Aug 31   Prior Therapy not in last year     Precautions   Precautions None     Restrictions   Weight Bearing Restrictions No     Balance Screen   Has the patient fallen in the past 6 months No     [place] residence     Prior Function   Level of Independence Independent     Cognition   Overall Cognitive Status Within Functional Limits for tasks assessed     Observation/Other Assessments   Focus on Therapeutic Outcomes (FOTO)  41% ability     ROM / Strength   AROM / PROM / Strength AROM;Strength     AROM   AROM Assessment Site Cervical    Cervical Flexion 54  C6, resting at 50 deg   Cervical - Right Side Bend 8   Cervical - Left Side Bend 12   Cervical - Right Rotation 20   Cervical - Left Rotation 40     Strength   Strength Assessment Site Shoulder   Right/Left Shoulder Right;Left   Right Shoulder Flexion 4-/5   Right Shoulder ABduction 4/5   Right Shoulder Internal Rotation 4+/5   Right Shoulder External Rotation 4/5   Left Shoulder Flexion 4-/5   Left Shoulder ABduction 3+/5   Left Shoulder Internal Rotation 4+/5   Left Shoulder External Rotation 4/5     Palpation   Spinal mobility significant increase in available PROM in cervical rotation in a seated position, denies pain   Palpation comment concordant pain upon palpation to bilateral cervical musculature.                            PT Education - 06/18/16 1039    Education provided Yes   Education Details anatomy of condition, POC, HEP   Person(s) Educated Patient   Methods Explanation;Demonstration;Tactile cues;Verbal cues;Handout   Comprehension Verbalized understanding;Returned demonstration;Verbal cues required;Tactile cues required;Need further instruction          PT Short Term Goals - 06/18/16 1119      PT SHORT TERM GOAL #1   Title Pt will be indpendent with HEP as it has been established by 9/1   Baseline began establishing at eval   Time 3   Period Weeks   Status New     PT SHORT TERM GOAL #2   Title Able to demonstrate appropriate scapular retraction in conjunction with cervical retraction without cues   Baseline heavy VC req at eval   Time 3   Period Weeks   Status New     PT SHORT TERM GOAL #3   Title Pt will verbalize average pain <=3/10   Baseline 4/10 or higher at eval   Time 3   Period Weeks   Status New           PT Long Term Goals - 06/18/16 1122      PT LONG TERM GOAL #1   Title Pt will demonstrate increased AROM by 10 deg cervical rotation bilaterally to improve functional motion while  driving by [id number]   Baseline see objective information   Time 6   Period Weeks   Status New     PT LONG TERM GOAL #2   Title Pt will  be able to sleep through the night without being woken by occipital HA pain   Baseline woken frequently at eval   Time 6   Period Weeks   Status New     PT LONG TERM GOAL #3   Title Pt will be independent with final HEP in order to continue strengthening and challenging endurance following formal PT.   Baseline began establishing at eval   Time 6   Period Weeks   Status New     PT LONG TERM GOAL #4   Title FOTO score to 54% ability to indicate significant functional improvement   Baseline 41% ability at eval   Time 6   Period Weeks   Status New               Plan - 06/18/16 1114    Clinical Impression Statement Pt presents to PT with complaints of pain in neck that begins in occipital region and extends toward bilateral shoulders. Pt demo forward head posture with hinge at C6. MRI reveals multiple levels of anterolisthesis, stenosis and other degenerative changes. Pt Denies radicular pain that would indicate neural involvement at this time. Pt will benefit from skilled PT to train appropriate postural alignement to decrease tightness in cervical and shoulder musculature that is limiting motion and creating pain.    Rehab Potential Fair   Clinical Impairments Affecting Rehab Potential cervical anteriolisthesis per MRI, significant degenerative changes.    PT Frequency 2x / week   PT Duration 6 weeks   PT Treatment/Interventions ADLs/Self Care Home Management;Cryotherapy;Moist Heat;Traction;Therapeutic activities;Therapeutic exercise;Neuromuscular re-education;Patient/family education;Passive range of motion;Manual techniques;Dry needling;Taping;Electrical Stimulation   PT Next Visit Plan pulleys, rows, manual techniques to decrease tension   PT Home Exercise Plan cervical retractions; isometric extension bilat UE.    Consulted and Agree with  Plan of Care Patient      Patient will benefit from skilled therapeutic intervention in order to improve the following deficits and impairments:  Decreased range of motion, Pain, Improper body mechanics, Decreased strength, Postural dysfunction  Visit Diagnosis: Cervicalgia - Plan: PT plan of care cert/re-cert  Muscle weakness (generalized) - Plan: PT plan of care cert/re-cert      G-Codes - [id number] [id number]    Functional Assessment Tool Used FOTO 41% ability (59% disability), clinical judgement   Functional Limitation Mobility: Walking and moving around   Mobility: Walking and Moving Around Current Status [phone]) At least 40 percent but less than 60 percent impaired, limited or restricted   Mobility: Walking and Moving Around Goal Status [phone]) At least 20 percent but less than 40 percent impaired, limited or restricted       Problem List Patient Active Problem List   Diagnosis Date Noted  . Malignant neoplasm of right female breast ([place]) 01/23/2016  . History of chemotherapy 01/23/2016  . High risk medication use 07/29/2015  . Screening mammogram for high-risk patient 07/29/2015  . Estrogen deficiency 07/29/2015  . 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/24/2015  . Dense breast tissue 01/24/2015  . Hx of adenomatous colonic polyps 01/24/2015  . Plantar fasciitis, bilateral 01/24/2015  . Breast cancer, right breast ([place]) 07/15/2013  . Syncope 09/15/2012  . Hypokalemia 09/15/2012  . Chronic back pain   . Chronic urinary tract infection   . Hypertension   . Arthritis   . Synovial cyst of lumbar facet joint 11/01/2011    Shalik Sanfilippo C. Twanna Resh PT, DPT 06/18/16 11:31 AM   [employer],  [place], [address] Phone: [phone]   Fax:  [phone]  Name: DARIENE HELLBERG MRN: [id number] Date of Birth: 04-03-1946

## 2016-06-25 ENCOUNTER — Ambulatory Visit: Payer: Medicare Other | Admitting: Physical Therapy

## 2016-06-25 DIAGNOSIS — M542 Cervicalgia: Secondary | ICD-10-CM | POA: Diagnosis not present

## 2016-06-25 DIAGNOSIS — M6281 Muscle weakness (generalized): Secondary | ICD-10-CM | POA: Diagnosis not present

## 2016-06-25 DIAGNOSIS — M545 Low back pain: Secondary | ICD-10-CM | POA: Diagnosis not present

## 2016-06-25 DIAGNOSIS — R413 Other amnesia: Secondary | ICD-10-CM | POA: Diagnosis not present

## 2016-06-25 NOTE — Patient Instructions (Signed)
Minor cues for home exercises

## 2016-06-25 NOTE — Therapy (Signed)
[place], [place], [address] Phone: [phone]   Fax:  [phone]  Physical Therapy Treatment  Patient Details  Name: Barbara Rangel MRN: [id number] Date of Birth: [birth year] Referring Provider: Janice Coffin. Nelva Bush, MD  Encounter Date: 06/25/2016      PT End of Session - 06/25/16 1805    Visit Number 2   Number of Visits 13   Date for PT Re-Evaluation 07/16/16   PT Start Time [id number]   PT Stop Time 1510   PT Time Calculation (min) 55 min   Activity Tolerance Patient tolerated treatment well   Behavior During Therapy [facility] for tasks assessed/performed      Past Medical History:  Diagnosis Date  . Arthritis    degenerative arthritis  . Cancer ([place])    hx breast cancer-right  . Chronic back pain    right radicular leg pain  . Chronic urinary tract infection    takes Macrodantin and Cranberry daily  . Constipation    r/t pain meds and takes Dulcolax nightly  . FHx: colonic polyps    hx of and mother had colon cancer  . Glaucoma    uses Xalantan nightly  . Hypertension    takes Amlodipine daily  . Other and unspecified general anesthetics causing adverse effect in therapeutic use    hard to wake up  . PONV (postoperative nausea and vomiting)     Past Surgical History:  Procedure Laterality Date  . ANKLE SURGERY  2011   left ankle with screws and plates  . BREAST LUMPECTOMY     x2  . cataract surgery  2005   right eye  . COLONOSCOPY    . COLONOSCOPY WITH PROPOFOL N/A 10/11/2014   Procedure: COLONOSCOPY WITH PROPOFOL;  Surgeon: Garlan Fair, MD;  [facility];  Service: Endoscopy;  Laterality: N/A;  . EYE SURGERY  2004   right eye-detached retina  . left breast biopsy  2009  . LUMBAR LAMINECTOMY/DECOMPRESSION MICRODISCECTOMY  10/31/2011   Procedure: LUMBAR LAMINECTOMY/DECOMPRESSION MICRODISCECTOMY;  Surgeon: Dahlia Bailiff;  Location: [facility];  Service: Orthopedics;   Laterality: Right;  L4-5 Decompression with Right Facet Decompression   . MASTECTOMY  2005   right  d/t breast cancer;no sticks to right arm LYMPH NODES REMOVED.  Marland Kitchen RETINAL DETACHMENT SURGERY  2004  . right breast biopsy  2005  . TUBAL LIGATION  1980    There were no vitals filed for this visit.      Subjective Assessment - 06/25/16 1801    Subjective Pain about the same 4/10.  I need to check to see if iI am doing my exercises correctly.   Pain Score 4    Pain Location Neck   Pain Orientation Right;Left   Pain Descriptors / Indicators Burning;Tightness   Pain Type Chronic pain   Pain Frequency Intermittent   Aggravating Factors  turning head.     Pain Relieving Factors stretches,  shower                         [facility] Adult PT Treatment/Exercise - 06/25/16 0001      Neck Exercises: Standing   Other Standing Exercises shoulder isometric extension with elbows flexex,  small pillow at head ,  cues 10 + X 5 seconds     Neck Exercises: Seated   Neck Retraction 10 reps  minor cues     Neck Exercises: Supine   Shoulder  Flexion 10 reps   Shoulder Flexion Limitations red band abduction 10 X   she moves shoulders to 90 degrees.     Other Supine Exercise cervical stabilization series.  5 X each with head press:  circles,  horizontal abduction,  flexion extension.  Cmaller motions,       Shoulder Exercises: Standing   Row Strengthening;Both;10 reps;Theraband   Theraband Level (Shoulder Row) Level 2 (Red);Level 3 (Green)   Row Limitations cues initially     Moist Heat Therapy   Number Minutes Moist Heat 10 Minutes   Moist Heat Location Cervical     Manual Therapy   Manual therapy comments brief  neck musculature tight.                    PT Short Term Goals - 06/25/16 1810      PT SHORT TERM GOAL #1   Title Pt will be indpendent with HEP as it has been established by 9/1   Baseline cues needed   Time 3   Period Weeks   Status On-going     PT  SHORT TERM GOAL #2   Title Able to demonstrate appropriate scapular retraction in conjunction with cervical retraction without cues   Baseline cues   Time 3   Period Weeks   Status On-going     PT SHORT TERM GOAL #3   Title Pt will verbalize average pain <=3/10   Baseline 4/10 -5/10 last 2 days   Time 3   Period Weeks   Status On-going           PT Long Term Goals - 06/18/16 1122      PT LONG TERM GOAL #1   Title Pt will demonstrate increased AROM by 10 deg cervical rotation bilaterally to improve functional motion while driving by [id number]   Baseline see objective information   Time 6   Period Weeks   Status New     PT LONG TERM GOAL #2   Title Pt will be able to sleep through the night without being woken by occipital HA pain   Baseline woken frequently at eval   Time 6   Period Weeks   Status New     PT LONG TERM GOAL #3   Title Pt will be independent with final HEP in order to continue strengthening and challenging endurance following formal PT.   Baseline began establishing at eval   Time 6   Period Weeks   Status New     PT LONG TERM GOAL #4   Title FOTO score to 54% ability to indicate significant functional improvement   Baseline 41% ability at eval   Time 6   Period Weeks   Status New               Plan - 06/25/16 1805    Clinical Impression Statement Exercises consisted of majority of session with brief manual/heat.  Patient has Muscular deviations in neck, left musculature protruding> Right.  No new goals mety.Less pain noted at the end of session.   PT Next Visit Plan pulleys, rows, manual techniques to decrease tension.  Try manual first ?      Patient will benefit from skilled therapeutic intervention in order to improve the following deficits and impairments:     Visit Diagnosis: Cervicalgia  Muscle weakness (generalized)  Bilateral low back pain, with sciatica presence unspecified  Complaints of memory disturbance     Problem  List Patient Active Problem  List   Diagnosis Date Noted  . Malignant neoplasm of right female breast ([place]) 01/23/2016  . History of chemotherapy 01/23/2016  . High risk medication use 07/29/2015  . Screening mammogram for high-risk patient 07/29/2015  . Estrogen deficiency 07/29/2015  . 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/24/2015  . Dense breast tissue 01/24/2015  . Hx of adenomatous colonic polyps 01/24/2015  . Plantar fasciitis, bilateral 01/24/2015  . Breast cancer, right breast ([place]) 07/15/2013  . Syncope 09/15/2012  . Hypokalemia 09/15/2012  . Chronic back pain   . Chronic urinary tract infection   . Hypertension   . Arthritis   . Synovial cyst of lumbar facet joint 11/01/2011    [facility] 06/25/2016, [place], [place], [address] Phone: [phone]   Fax:  [phone]  Name: DONNY DUBBERT MRN: [id number] Date of Birth: May 16, 1946   Melvenia Needles, PTA 06/25/16 6:11 PM Phone: [phone] Fax: [phone]

## 2016-06-27 ENCOUNTER — Ambulatory Visit: Payer: Medicare Other | Admitting: Physical Therapy

## 2016-06-27 DIAGNOSIS — R413 Other amnesia: Secondary | ICD-10-CM | POA: Diagnosis not present

## 2016-06-27 DIAGNOSIS — M6281 Muscle weakness (generalized): Secondary | ICD-10-CM

## 2016-06-27 DIAGNOSIS — M542 Cervicalgia: Secondary | ICD-10-CM | POA: Diagnosis not present

## 2016-06-27 DIAGNOSIS — M545 Low back pain: Secondary | ICD-10-CM | POA: Diagnosis not present

## 2016-06-27 NOTE — Therapy (Signed)
[place], [place], [address] Phone: [phone]   Fax:  [phone]  Physical Therapy Treatment  Patient Details  Name: Barbara Rangel MRN: [id number] Date of Birth: December 26, 1945 Referring Provider: Janice Coffin. Nelva Bush, MD  Encounter Date: 06/27/2016      PT End of Session - 06/27/16 1755    Visit Number 3   Number of Visits 13   Date for PT Re-Evaluation 07/16/16   PT Start Time [id number]   PT Stop Time 1650   PT Time Calculation (min) 60 min   Activity Tolerance Patient tolerated treatment well   Behavior During Therapy [facility] for tasks assessed/performed      Past Medical History:  Diagnosis Date  . Arthritis    degenerative arthritis  . Cancer ([place])    hx breast cancer-right  . Chronic back pain    right radicular leg pain  . Chronic urinary tract infection    takes Macrodantin and Cranberry daily  . Constipation    r/t pain meds and takes Dulcolax nightly  . FHx: colonic polyps    hx of and mother had colon cancer  . Glaucoma    uses Xalantan nightly  . Hypertension    takes Amlodipine daily  . Other and unspecified general anesthetics causing adverse effect in therapeutic use    hard to wake up  . PONV (postoperative nausea and vomiting)     Past Surgical History:  Procedure Laterality Date  . ANKLE SURGERY  2011   left ankle with screws and plates  . BREAST LUMPECTOMY     x2  . cataract surgery  2005   right eye  . COLONOSCOPY    . COLONOSCOPY WITH PROPOFOL N/A 10/11/2014   Procedure: COLONOSCOPY WITH PROPOFOL;  Surgeon: Garlan Fair, MD;  [facility];  Service: Endoscopy;  Laterality: N/A;  . EYE SURGERY  2004   right eye-detached retina  . left breast biopsy  2009  . LUMBAR LAMINECTOMY/DECOMPRESSION MICRODISCECTOMY  10/31/2011   Procedure: LUMBAR LAMINECTOMY/DECOMPRESSION MICRODISCECTOMY;  Surgeon: Dahlia Bailiff;  Location: [place] [facility];  Service: Orthopedics;   Laterality: Right;  L4-5 Decompression with Right Facet Decompression   . MASTECTOMY  2005   right  d/t breast cancer;no sticks to right arm LYMPH NODES REMOVED.  Marland Kitchen RETINAL DETACHMENT SURGERY  2004  . right breast biopsy  2005  . TUBAL LIGATION  1980    There were no vitals filed for this visit.      Subjective Assessment - 06/27/16 1748    Currently in Pain? Yes   Pain Score 4    Pain Location Neck   Pain Orientation Left;Right                         [facility] Adult PT Treatment/Exercise - 06/27/16 0001      Neck Exercises: Seated   Neck Retraction 5 reps;5 secs   Cervical Rotation Both;5 reps;Limitations   Cervical Rotation Limitations decreased Range   Lateral Flexion 5 reps;Both   Lateral Flexion Limitations decreased range   Other Seated Exercise Trunk/head rotation 2 x each side,  cues     Neck Exercises: Supine   Neck Retraction 5 reps;5 secs  in decompression position,  also shoulder press 5 X 5 sec   Shoulder Flexion 10 reps     Neck Exercises: Sidelying   Other Sidelying Exercise On left side,  modified bookopener 5 X limited motion (limited  due mat as too close to wall for arms to be extended.      Moist Heat Therapy   Number Minutes Moist Heat 10 Minutes   Moist Heat Location Cervical     Manual Therapy   Manual Therapy Soft tissue mobilization;Passive ROM   Soft tissue mobilization light trigger point release,  patient does not tolerate moderate pressure.  Tissue softened,  however tension ans stiffness remain.    Passive ROM Strumming to muscles with slight stretch post soft tissue                  PT Short Term Goals - 06/25/16 1810      PT SHORT TERM GOAL #1   Title Pt will be indpendent with HEP as it has been established by 9/1   Baseline cues needed   Time 3   Period Weeks   Status On-going     PT SHORT TERM GOAL #2   Title Able to demonstrate appropriate scapular retraction in conjunction with cervical retraction  without cues   Baseline cues   Time 3   Period Weeks   Status On-going     PT SHORT TERM GOAL #3   Title Pt will verbalize average pain <=3/10   Baseline 4/10 -5/10 last 2 days   Time 3   Period Weeks   Status On-going           PT Long Term Goals - 06/18/16 1122      PT LONG TERM GOAL #1   Title Pt will demonstrate increased AROM by 10 deg cervical rotation bilaterally to improve functional motion while driving by [id number]   Baseline see objective information   Time 6   Period Weeks   Status New     PT LONG TERM GOAL #2   Title Pt will be able to sleep through the night without being woken by occipital HA pain   Baseline woken frequently at eval   Time 6   Period Weeks   Status New     PT LONG TERM GOAL #3   Title Pt will be independent with final HEP in order to continue strengthening and challenging endurance following formal PT.   Baseline began establishing at eval   Time 6   Period Weeks   Status New     PT LONG TERM GOAL #4   Title FOTO score to 54% ability to indicate significant functional improvement   Baseline 41% ability at eval   Time 6   Period Weeks   Status New               Plan - 06/27/16 1755    Clinical Impression Statement Manual helpful today.  Patient able to demonstrate less guarding with ROM post session.  No new goals assessed.    PT Next Visit Plan manual as needed,  upper back/neck stabilization. Try book opener again   PT Home Exercise Plan cervical retractions; isometric extension bilat UE. to continue   Consulted and Agree with Plan of Care Patient      Patient will benefit from skilled therapeutic intervention in order to improve the following deficits and impairments:  Decreased range of motion, Pain, Improper body mechanics, Decreased strength, Postural dysfunction  Visit Diagnosis: Cervicalgia  Muscle weakness (generalized)     Problem List Patient Active Problem List   Diagnosis Date Noted  . Malignant  neoplasm of right female breast ([place]) 01/23/2016  . History of chemotherapy 01/23/2016  . High  risk medication use 07/29/2015  . Screening mammogram for high-risk patient 07/29/2015  . Estrogen deficiency 07/29/2015  . 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/24/2015  . Dense breast tissue 01/24/2015  . Hx of adenomatous colonic polyps 01/24/2015  . Plantar fasciitis, bilateral 01/24/2015  . Breast cancer, right breast ([place]) 07/15/2013  . Syncope 09/15/2012  . Hypokalemia 09/15/2012  . Chronic back pain   . Chronic urinary tract infection   . Hypertension   . Arthritis   . Synovial cyst of lumbar facet joint 11/01/2011    [facility] 06/27/2016, 5:58 PM  [place], [place], [address] Phone: [phone]   Fax:  [phone]  Name: Barbara Rangel MRN: [id number] Date of Birth: 10-Jan-1946   Melvenia Needles, PTA 06/27/16 5:59 PM Phone: [phone] Fax: [phone]

## 2016-07-02 ENCOUNTER — Encounter: Payer: Self-pay | Admitting: Physical Therapy

## 2016-07-02 ENCOUNTER — Ambulatory Visit: Payer: Medicare Other | Admitting: Physical Therapy

## 2016-07-02 DIAGNOSIS — R413 Other amnesia: Secondary | ICD-10-CM | POA: Diagnosis not present

## 2016-07-02 DIAGNOSIS — M6281 Muscle weakness (generalized): Secondary | ICD-10-CM

## 2016-07-02 DIAGNOSIS — M542 Cervicalgia: Secondary | ICD-10-CM | POA: Diagnosis not present

## 2016-07-02 DIAGNOSIS — M545 Low back pain: Secondary | ICD-10-CM | POA: Diagnosis not present

## 2016-07-02 NOTE — Therapy (Signed)
[place], [place], [address] Phone: [phone]   Fax:  [phone]  Physical Therapy Treatment  Patient Details  Name: Barbara Rangel MRN: [id number] Date of Birth: August 11, 1946 Referring Provider: Janice Coffin. Nelva Bush, MD  Encounter Date: 07/02/2016      PT End of Session - 07/02/16 1019    Visit Number 4   Number of Visits 13   Date for PT Re-Evaluation 07/16/16   Authorization Type Medicare- Kx at visit 15   PT Start Time 1016   PT Stop Time 1115   PT Time Calculation (min) 59 min   Activity Tolerance Patient tolerated treatment well   Behavior During Therapy [facility] for tasks assessed/performed      Past Medical History:  Diagnosis Date  . Arthritis    degenerative arthritis  . Cancer ([place])    hx breast cancer-right  . Chronic back pain    right radicular leg pain  . Chronic urinary tract infection    takes Macrodantin and Cranberry daily  . Constipation    r/t pain meds and takes Dulcolax nightly  . FHx: colonic polyps    hx of and mother had colon cancer  . Glaucoma    uses Xalantan nightly  . Hypertension    takes Amlodipine daily  . Other and unspecified general anesthetics causing adverse effect in therapeutic use    hard to wake up  . PONV (postoperative nausea and vomiting)     Past Surgical History:  Procedure Laterality Date  . ANKLE SURGERY  2011   left ankle with screws and plates  . BREAST LUMPECTOMY     x2  . cataract surgery  2005   right eye  . COLONOSCOPY    . COLONOSCOPY WITH PROPOFOL N/A 10/11/2014   Procedure: COLONOSCOPY WITH PROPOFOL;  Surgeon: Garlan Fair, MD;  Location: [facility];  Service: Endoscopy;  Laterality: N/A;  . EYE SURGERY  2004   right eye-detached retina  . left breast biopsy  2009  . LUMBAR LAMINECTOMY/DECOMPRESSION MICRODISCECTOMY  10/31/2011   Procedure: LUMBAR LAMINECTOMY/DECOMPRESSION MICRODISCECTOMY;  Surgeon: Dahlia Bailiff;   Location: [place];  Service: Orthopedics;  Laterality: Right;  L4-5 Decompression with Right Facet Decompression   . MASTECTOMY  2005   right  d/t breast cancer;no sticks to right arm LYMPH NODES REMOVED.  Marland Kitchen RETINAL DETACHMENT SURGERY  2004  . right breast biopsy  2005  . TUBAL LIGATION  1980    There were no vitals filed for this visit.      Subjective Assessment - 07/02/16 1018    Subjective Feeling pain in her neck to the occipital region, stiffness in turning.    Currently in Pain? Yes   Pain Score 4    Pain Location Neck   Pain Orientation Upper   Pain Descriptors / Indicators Aching   Pain Radiating Towards bilateral posterior shoulders                         [facility] Adult PT Treatment/Exercise - 07/02/16 0001      Exercises   Exercises Shoulder     Neck Exercises: Standing   Neck Retraction Limitations cervical retraction paired with scpaular retraction in supine      Neck Exercises: Supine   Neck Retraction Limitations supine retraction, & retraction paired with scapular retraction     Shoulder Exercises: Supine   External Rotation 20 reps;10 reps   Theraband Level (  Shoulder External Rotation) Level 2 (Red)     Shoulder Exercises: Standing   Extension 15 reps   Theraband Level (Shoulder Extension) Level 2 (Red)   Row 20 reps   Theraband Level (Shoulder Row) Level 2 (Red)   Other Standing Exercises scapular retraction against wall     Shoulder Exercises: Pulleys   Flexion 3 minutes     Shoulder Exercises: Stretch   Other Shoulder Stretches supine pec stretch     Moist Heat Therapy   Number Minutes Moist Heat 10 Minutes   Moist Heat Location Cervical     Manual Therapy   Manual therapy comments suboccipital release; trigger point release L upper trap and levator; manual traction                PT Education - 07/02/16 1019    Education provided Yes   Education Details exercise form/rationale   Person(s) Educated Patient    Methods Explanation;Demonstration;Tactile cues;Verbal cues   Comprehension Verbalized understanding;Returned demonstration;Verbal cues required;Tactile cues required;Need further instruction          PT Short Term Goals - 06/25/16 1810      PT SHORT TERM GOAL #1   Title Pt will be indpendent with HEP as it has been established by 9/1   Baseline cues needed   Time 3   Period Weeks   Status On-going     PT SHORT TERM GOAL #2   Title Able to demonstrate appropriate scapular retraction in conjunction with cervical retraction without cues   Baseline cues   Time 3   Period Weeks   Status On-going     PT SHORT TERM GOAL #3   Title Pt will verbalize average pain <=3/10   Baseline 4/10 -5/10 last 2 days   Time 3   Period Weeks   Status On-going           PT Long Term Goals - 06/18/16 1122      PT LONG TERM GOAL #1   Title Pt will demonstrate increased AROM by 10 deg cervical rotation bilaterally to improve functional motion while driving by [id number]   Baseline see objective information   Time 6   Period Weeks   Status New     PT LONG TERM GOAL #2   Title Pt will be able to sleep through the night without being woken by occipital HA pain   Baseline woken frequently at eval   Time 6   Period Weeks   Status New     PT LONG TERM GOAL #3   Title Pt will be independent with final HEP in order to continue strengthening and challenging endurance following formal PT.   Baseline began establishing at eval   Time 6   Period Weeks   Status New     PT LONG TERM GOAL #4   Title FOTO score to 54% ability to indicate significant functional improvement   Baseline 41% ability at eval   Time 6   Period Weeks   Status New               Plan - 07/02/16 1257    Clinical Impression Statement Pt requires extensive cuing and education in order to perform appropriate scapular retraction. A lot of education was given today due to pt reporting she was unable to feel if she was  contracting appropriate musculature.    PT Next Visit Plan manual as needed,  upper back/neck stabilization.    PT Home Exercise Plan  cervical retractions; isometric extension bilat UE, scapular retractions   Consulted and Agree with Plan of Care Patient      Patient will benefit from skilled therapeutic intervention in order to improve the following deficits and impairments:     Visit Diagnosis: Cervicalgia  Muscle weakness (generalized)     Problem List Patient Active Problem List   Diagnosis Date Noted  . Malignant neoplasm of right female breast ([place]) 01/23/2016  . History of chemotherapy 01/23/2016  . High risk medication use 07/29/2015  . Screening mammogram for high-risk patient 07/29/2015  . Estrogen deficiency 07/29/2015  . 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/24/2015  . Dense breast tissue 01/24/2015  . Hx of adenomatous colonic polyps 01/24/2015  . Plantar fasciitis, bilateral 01/24/2015  . Breast cancer, right breast ([place]) 07/15/2013  . Syncope 09/15/2012  . Hypokalemia 09/15/2012  . Chronic back pain   . Chronic urinary tract infection   . Hypertension   . Arthritis   . Synovial cyst of lumbar facet joint 11/01/2011    Henrik Orihuela C. Angeldejesus Callaham PT, DPT 07/02/16 1:02 PM   [place] [facility] [address] [place], [place], [address] Phone: [phone]   Fax:  [phone]  Name: ANISSIA OGEA MRN: [id number] Date of Birth: 11-14-1945

## 2016-07-04 ENCOUNTER — Ambulatory Visit: Payer: Medicare Other | Admitting: Physical Therapy

## 2016-07-04 DIAGNOSIS — M6281 Muscle weakness (generalized): Secondary | ICD-10-CM

## 2016-07-04 DIAGNOSIS — M542 Cervicalgia: Secondary | ICD-10-CM

## 2016-07-04 DIAGNOSIS — R413 Other amnesia: Secondary | ICD-10-CM

## 2016-07-04 DIAGNOSIS — M545 Low back pain: Secondary | ICD-10-CM | POA: Diagnosis not present

## 2016-07-04 NOTE — Therapy (Signed)
[place], [place], [address] Phone: [phone]   Fax:  [phone]  Physical Therapy Treatment  Patient Details  Name: Barbara Rangel MRN: [id number] Date of Birth: 07-06-1946 Referring Provider: Janice Coffin. Nelva Bush, MD  Encounter Date: 07/04/2016      PT End of Session - 07/04/16 1612    Visit Number 5   Number of Visits 13   Date for PT Re-Evaluation 07/16/16   PT Start Time [id number]   PT Stop Time 1250   PT Time Calculation (min) 65 min   Activity Tolerance Patient tolerated treatment well   Behavior During Therapy [facility] for tasks assessed/performed      Past Medical History:  Diagnosis Date  . Arthritis    degenerative arthritis  . Cancer ([place])    hx breast cancer-right  . Chronic back pain    right radicular leg pain  . Chronic urinary tract infection    takes Macrodantin and Cranberry daily  . Constipation    r/t pain meds and takes Dulcolax nightly  . FHx: colonic polyps    hx of and mother had colon cancer  . Glaucoma    uses Xalantan nightly  . Hypertension    takes Amlodipine daily  . Other and unspecified general anesthetics causing adverse effect in therapeutic use    hard to wake up  . PONV (postoperative nausea and vomiting)     Past Surgical History:  Procedure Laterality Date  . ANKLE SURGERY  2011   left ankle with screws and plates  . BREAST LUMPECTOMY     x2  . cataract surgery  2005   right eye  . COLONOSCOPY    . COLONOSCOPY WITH PROPOFOL N/A 10/11/2014   Procedure: COLONOSCOPY WITH PROPOFOL;  Surgeon: Garlan Fair, MD;  [facility];  Service: Endoscopy;  Laterality: N/A;  . EYE SURGERY  2004   right eye-detached retina  . left breast biopsy  2009  . LUMBAR LAMINECTOMY/DECOMPRESSION MICRODISCECTOMY  10/31/2011   Procedure: LUMBAR LAMINECTOMY/DECOMPRESSION MICRODISCECTOMY;  Surgeon: Dahlia Bailiff;  Location: [place];  Service: Orthopedics;   Laterality: Right;  L4-5 Decompression with Right Facet Decompression   . MASTECTOMY  2005   right  d/t breast cancer;no sticks to right arm LYMPH NODES REMOVED.  Marland Kitchen RETINAL DETACHMENT SURGERY  2004  . right breast biopsy  2005  . TUBAL LIGATION  1980    There were no vitals filed for this visit.      Subjective Assessment - 07/04/16 1152    Subjective I felt good for the rest of the day and some improvements into the next day.  I wake up with headache daily.  Less frequent at night.  They wake her early morning. ( ever other day since the last week or so  an increase over the last 2 weeks0)  I get 3 or 4 hours  of sleep at night.  I did sleep 5 hours prior to 2 weeks ago.       Currently in Pain? Yes   Pain Score 4    Pain Location Head   Pain Orientation Posterior   Pain Descriptors / Indicators --  leftover headach pain   Pain Radiating Towards stiffness across shoulders   Pain Frequency Intermittent   Aggravating Factors  sleeping,  turning head,  reaching   Pain Relieving Factors stretches , shower, massage   Effect of Pain on Daily Activities sleep interrupted  [place] Adult PT Treatment/Exercise - 07/04/16 0001      Neck Exercises: Seated   Neck Retraction 5 reps;5 secs   Shoulder Shrugs 10 reps   Shoulder Shrugs Limitations pops audible palpated inferior border of scapulaa    Other Seated Exercise red band at elbow for shoulder depression 10 X each     Shoulder Exercises: Seated   Elevation 10 reps  needed cues for retraction to decrease pops ,   Retraction 10 reps   Row 10 reps   External Rotation 10 reps   Theraband Level (Shoulder External Rotation) Level 2 (Red)  cues, technique     Shoulder Exercises: Pulleys   Flexion 2 minutes  some pain end range.     Shoulder Exercises: IT sales professional Limitations in doorway.  both tried 2 X 30,  single arm 1 X 30 each worked best.       Moist Heat Therapy   Number  Minutes Moist Heat 10 Minutes   Moist Heat Location Cervical     Manual Therapy   Manual Therapy Soft tissue mobilization   Manual therapy comments suboccipital release; trigger point release L upper trap and levator;   tissue softened.                PT Education - 07/04/16 1406    Education provided Yes   Education Details Post PT info for pilates for back pain and group exercise.  single arm door way stretch.   Person(s) Educated Patient   Methods Explanation;Handout;Demonstration   Comprehension Verbalized understanding;Returned demonstration          PT Short Term Goals - 07/04/16 1615      PT SHORT TERM GOAL #1   Title Pt will be indpendent with HEP as it has been established by 9/1   Baseline cues needed   Time 3   Period Weeks   Status On-going     PT SHORT TERM GOAL #2   Title Able to demonstrate appropriate scapular retraction in conjunction with cervical retraction without cues   Baseline needs cues   Time 3   Period Weeks   Status On-going     PT SHORT TERM GOAL #3   Title Pt will verbalize average pain <=3/10   Baseline pain varies   Time 3   Period Weeks   Status On-going           PT Long Term Goals - 06/18/16 1122      PT LONG TERM GOAL #1   Title Pt will demonstrate increased AROM by 10 deg cervical rotation bilaterally to improve functional motion while driving by [id number]   Baseline see objective information   Time 6   Period Weeks   Status New     PT LONG TERM GOAL #2   Title Pt will be able to sleep through the night without being woken by occipital HA pain   Baseline woken frequently at eval   Time 6   Period Weeks   Status New     PT LONG TERM GOAL #3   Title Pt will be independent with final HEP in order to continue strengthening and challenging endurance following formal PT.   Baseline began establishing at eval   Time 6   Period Weeks   Status New     PT LONG TERM GOAL #4   Title FOTO score to 54% ability to  indicate significant functional improvement   Baseline 41% ability at eval  Time 6   Period Weeks   Status New               Plan - 07/04/16 1613    Clinical Impression Statement Neck tension visibally decreased with door way stretch.   Patient noted pain reduction and less tension in her frame post session.  Patient has noticed increased frequency of morning headaches, how ever she has less at night.    PT Next Visit Plan Decompression trial, or consider supine over faom roller   PT Home Exercise Plan doorway stretch   Consulted and Agree with Plan of Care Patient      Patient will benefit from skilled therapeutic intervention in order to improve the following deficits and impairments:  Decreased range of motion, Pain, Improper body mechanics, Decreased strength, Postural dysfunction  Visit Diagnosis: Cervicalgia  Muscle weakness (generalized)  Bilateral low back pain, with sciatica presence unspecified  Complaints of memory disturbance     Problem List Patient Active Problem List   Diagnosis Date Noted  . Malignant neoplasm of right female breast ([place]) 01/23/2016  . History of chemotherapy 01/23/2016  . High risk medication use 07/29/2015  . Screening mammogram for high-risk patient 07/29/2015  . Estrogen deficiency 07/29/2015  . 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/24/2015  . Dense breast tissue 01/24/2015  . Hx of adenomatous colonic polyps 01/24/2015  . Plantar fasciitis, bilateral 01/24/2015  . Breast cancer, right breast ([place]) 07/15/2013  . Syncope 09/15/2012  . Hypokalemia 09/15/2012  . Chronic back pain   . Chronic urinary tract infection   . Hypertension   . Arthritis   . Synovial cyst of lumbar facet joint 11/01/2011    [facility] 07/04/2016, 6:03 PM  [place], [place], [address] Phone: [phone]   Fax:  [phone]  Name: Barbara Rangel MRN:  [id number] Date of Birth: Aug 17, 1946   Melvenia Needles, PTA 07/04/16 6:04 PM Phone: [phone] Fax: [phone]

## 2016-07-04 NOTE — Patient Instructions (Signed)
Single arm door way stretch  Issued from exercise drawer.   1-2 X a day, 3 X for 30 seconds.  Gentle stretch only

## 2016-07-05 DIAGNOSIS — R350 Frequency of micturition: Secondary | ICD-10-CM | POA: Diagnosis not present

## 2016-07-05 DIAGNOSIS — N3281 Overactive bladder: Secondary | ICD-10-CM | POA: Diagnosis not present

## 2016-07-08 DIAGNOSIS — Z8659 Personal history of other mental and behavioral disorders: Secondary | ICD-10-CM | POA: Diagnosis not present

## 2016-07-08 DIAGNOSIS — M545 Low back pain: Secondary | ICD-10-CM | POA: Diagnosis not present

## 2016-07-08 DIAGNOSIS — M5136 Other intervertebral disc degeneration, lumbar region: Secondary | ICD-10-CM | POA: Diagnosis not present

## 2016-07-09 ENCOUNTER — Ambulatory Visit: Payer: Medicare Other | Admitting: Physical Therapy

## 2016-07-09 DIAGNOSIS — M542 Cervicalgia: Secondary | ICD-10-CM

## 2016-07-09 DIAGNOSIS — M6281 Muscle weakness (generalized): Secondary | ICD-10-CM | POA: Diagnosis not present

## 2016-07-09 DIAGNOSIS — R413 Other amnesia: Secondary | ICD-10-CM | POA: Diagnosis not present

## 2016-07-09 DIAGNOSIS — M545 Low back pain: Secondary | ICD-10-CM | POA: Diagnosis not present

## 2016-07-09 NOTE — Therapy (Signed)
[place], [place], [address] Phone: [phone]   Fax:  [phone]  Physical Therapy Treatment  Patient Details  Name: Barbara Rangel MRN: [id number] Date of Birth: 11/07/1946 Referring Provider: Janice Coffin. Nelva Bush, MD  Encounter Date: 07/09/2016      PT End of Session - 07/09/16 1625    Visit Number 6   Number of Visits 13   Date for PT Re-Evaluation 07/16/16   PT Start Time 1026   PT Stop Time 1126   PT Time Calculation (min) 60 min   Activity Tolerance Patient tolerated treatment well   Behavior During Therapy W[facility] for tasks assessed/performed      Past Medical History:  Diagnosis Date  . Arthritis    degenerative arthritis  . Cancer ([place])    hx breast cancer-right  . Chronic back pain    right radicular leg pain  . Chronic urinary tract infection    takes Macrodantin and Cranberry daily  . Constipation    r/t pain meds and takes Dulcolax nightly  . FHx: colonic polyps    hx of and mother had colon cancer  . Glaucoma    uses Xalantan nightly  . Hypertension    takes Amlodipine daily  . Other and unspecified general anesthetics causing adverse effect in therapeutic use    hard to wake up  . PONV (postoperative nausea and vomiting)     Past Surgical History:  Procedure Laterality Date  . ANKLE SURGERY  2011   left ankle with screws and plates  . BREAST LUMPECTOMY     x2  . cataract surgery  2005   right eye  . COLONOSCOPY    . COLONOSCOPY WITH PROPOFOL N/A 10/11/2014   Procedure: COLONOSCOPY WITH PROPOFOL;  Surgeon: Garlan Fair, MD;  [facility];  Service: Endoscopy;  Laterality: N/A;  . EYE SURGERY  2004   right eye-detached retina  . left breast biopsy  2009  . LUMBAR LAMINECTOMY/DECOMPRESSION MICRODISCECTOMY  10/31/2011   Procedure: LUMBAR LAMINECTOMY/DECOMPRESSION MICRODISCECTOMY;  Surgeon: Dahlia Bailiff;  Location: [place];  Service: Orthopedics;   Laterality: Right;  L4-5 Decompression with Right Facet Decompression   . MASTECTOMY  2005   right  d/t breast cancer;no sticks to right arm LYMPH NODES REMOVED.  Marland Kitchen RETINAL DETACHMENT SURGERY  2004  . right breast biopsy  2005  . TUBAL LIGATION  1980    There were no vitals filed for this visit.      Subjective Assessment - 07/09/16 1027    Subjective Headachs getting worse.  Has one now.  Saw neurologist yesterday.  She may need injections..   Currently in Pain? Yes   Pain Score 4    Pain Location Head   Pain Descriptors / Indicators Headache   Pain Radiating Towards back of head   Pain Frequency Intermittent   Aggravating Factors  sleeping, worse first thing in morning. Driving sometimes hurts neck   Pain Relieving Factors manual, stretches   Effect of Pain on Daily Activities sleep interrupted            [facility] PT Assessment - 07/09/16 0001      AROM   Cervical - Right Side Bend 30   Cervical - Left Side Bend 35   Cervical - Right Rotation 35 with 4/10 pain   Cervical - Left Rotation 30  [place] Adult PT Treatment/Exercise - 07/09/16 0001      Neck Exercises: Standing   Other Standing Exercises Back resting on wall,  arms sweeping into abduction keeping backs of hands on wall 5 X  limited Motion,  cues needed   Other Standing Exercises standing back leaning on wall::  ER and diagonals with and easier without band, yellow.  Low pillow placed behind head initially for support.       Neck Exercises: Seated   Other Seated Exercise Diagonals at wall with and without band, yellow.  neck tightness lingers with bands , so no band best     Neck Exercises: Supine   Other Supine Exercise decompression attempted however unable to get patient comfortable so stopped     Shoulder Exercises: Pulleys   Flexion 2 minutes     Shoulder Exercises: Stretch   Corner Stretch Limitations doorway, double and single 3 x 30+   able to observe neck tension  decrease with these     Moist Heat Therapy   Number Minutes Moist Heat 10 Minutes   Moist Heat Location Cervical     Manual Therapy   Manual Therapy Soft tissue mobilization  showed how to use tennis balls   Manual therapy comments sub occiptal release, upper trap and paraspinal soft tissue,  neuromuscular trigger point release.  tissue softened post manual                  PT Short Term Goals - 07/09/16 1759      PT SHORT TERM GOAL #1   Title Pt will be indpendent with HEP as it has been established by 9/1   Baseline cues needed   Time 3   Period Weeks   Status On-going     PT SHORT TERM GOAL #2   Title Able to demonstrate appropriate scapular retraction in conjunction with cervical retraction without cues   Baseline needs cues   Time 3   Period Weeks   Status On-going     PT SHORT TERM GOAL #3   Title Pt will verbalize average pain <=3/10   Baseline 4-5/10 pain,  however it varies.   Time 3   Period Weeks   Status On-going           PT Long Term Goals - 07/09/16 1800      PT LONG TERM GOAL #1   Title Pt will demonstrate increased AROM by 10 deg cervical rotation bilaterally to improve functional motion while driving by 8/11   Baseline rotation improving, not yet 10 degrees, see objective information.   Time 6   Period Weeks   Status On-going     PT LONG TERM GOAL #2   Title Pt will be able to sleep through the night without being woken by occipital HA pain   Baseline wakes 2 X a night,  improving   Time 6   Period Weeks   Status On-going     PT LONG TERM GOAL #3   Title Pt will be independent with final HEP in order to continue strengthening and challenging endurance following formal PT.   Time 6   Period Weeks   Status On-going     PT LONG TERM GOAL #4   Title FOTO score to 54% ability to indicate significant functional improvement   Time 6   Period Weeks   Status Unable to assess               Plan - 07/09/16  1755     Clinical Impression Statement Neck Range improving with rotation,  see Notes.  Patient able to reduce headach with self snag flexion.   Patient does not tolerats supine exercises well due to pressure on her low back.  No new goals met today , progress toward ROM goals.   PT Next Visit Plan review self snag for headach,  upper body stabilization standing vs supine   PT Home Exercise Plan self snag,  tennis balls for trigger points   Consulted and Agree with Plan of Care Patient      Patient will benefit from skilled therapeutic intervention in order to improve the following deficits and impairments:  Decreased range of motion, Pain, Improper body mechanics, Decreased strength, Postural dysfunction  Visit Diagnosis: Cervicalgia  Muscle weakness (generalized)     Problem List Patient Active Problem List   Diagnosis Date Noted  . Malignant neoplasm of right female breast ([place]) 01/23/2016  . History of chemotherapy 01/23/2016  . High risk medication use 07/29/2015  . Screening mammogram for high-risk patient 07/29/2015  . Estrogen deficiency 07/29/2015  . 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/24/2015  . Dense breast tissue 01/24/2015  . Hx of adenomatous colonic polyps 01/24/2015  . Plantar fasciitis, bilateral 01/24/2015  . Breast cancer, right breast ([place]) 07/15/2013  . Syncope 09/15/2012  . Hypokalemia 09/15/2012  . Chronic back pain   . Chronic urinary tract infection   . Hypertension   . Arthritis   . Synovial cyst of lumbar facet joint 11/01/2011    [facility] 07/09/2016, 6:04 PM  [place], [place], [address] Phone: [phone]   Fax:  [phone]  Name: Barbara Rangel MRN: [id number] Date of Birth: December 19, 1945   Melvenia Needles, PTA 07/09/16 6:04 PM Phone: [phone] Fax: [phone]

## 2016-07-11 ENCOUNTER — Ambulatory Visit: Payer: Medicare Other | Admitting: Physical Therapy

## 2016-07-11 ENCOUNTER — Encounter: Payer: Self-pay | Admitting: Physical Therapy

## 2016-07-11 DIAGNOSIS — R413 Other amnesia: Secondary | ICD-10-CM | POA: Diagnosis not present

## 2016-07-11 DIAGNOSIS — M50823 Other cervical disc disorders at C6-C7 level: Secondary | ICD-10-CM | POA: Diagnosis not present

## 2016-07-11 DIAGNOSIS — M545 Low back pain: Secondary | ICD-10-CM | POA: Diagnosis not present

## 2016-07-11 DIAGNOSIS — M6281 Muscle weakness (generalized): Secondary | ICD-10-CM | POA: Diagnosis not present

## 2016-07-11 DIAGNOSIS — M542 Cervicalgia: Secondary | ICD-10-CM

## 2016-07-11 DIAGNOSIS — M5136 Other intervertebral disc degeneration, lumbar region: Secondary | ICD-10-CM | POA: Diagnosis not present

## 2016-07-11 DIAGNOSIS — S134XXS Sprain of ligaments of cervical spine, sequela: Secondary | ICD-10-CM | POA: Diagnosis not present

## 2016-07-11 NOTE — Therapy (Signed)
[place], [place], [address] Phone: [phone]   Fax:  [phone]  Physical Therapy Treatment  Patient Details  Name: Barbara Rangel MRN: [id number] Date of Birth: 1946/06/07 Referring Provider: Janice Coffin. Nelva Bush, MD  Encounter Date: 07/11/2016      PT End of Session - 07/11/16 1026    Visit Number 7   Number of Visits 13   Date for PT Re-Evaluation 08/02/16   Authorization Type Medicare- Kx at visit 15   PT Start Time 1018   PT Stop Time 1111   PT Time Calculation (min) 53 min   Activity Tolerance Patient tolerated treatment well   Behavior During Therapy WFL for tasks assessed/performed      Past Medical History:  Diagnosis Date  . Arthritis    degenerative arthritis  . Cancer ([place])    hx breast cancer-right  . Chronic back pain    right radicular leg pain  . Chronic urinary tract infection    takes Macrodantin and Cranberry daily  . Constipation    r/t pain meds and takes Dulcolax nightly  . FHx: colonic polyps    hx of and mother had colon cancer  . Glaucoma    uses Xalantan nightly  . Hypertension    takes Amlodipine daily  . Other and unspecified general anesthetics causing adverse effect in therapeutic use    hard to wake up  . PONV (postoperative nausea and vomiting)     Past Surgical History:  Procedure Laterality Date  . ANKLE SURGERY  2011   left ankle with screws and plates  . BREAST LUMPECTOMY     x2  . cataract surgery  2005   right eye  . COLONOSCOPY    . COLONOSCOPY WITH PROPOFOL N/A 10/11/2014   Procedure: COLONOSCOPY WITH PROPOFOL;  Surgeon: Garlan Fair, MD;  Location: [facility];  Service: Endoscopy;  Laterality: N/A;  . EYE SURGERY  2004   right eye-detached retina  . left breast biopsy  2009  . LUMBAR LAMINECTOMY/DECOMPRESSION MICRODISCECTOMY  10/31/2011   Procedure: LUMBAR LAMINECTOMY/DECOMPRESSION MICRODISCECTOMY;  Surgeon: Dahlia Bailiff;   Location: [facility];  Service: Orthopedics;  Laterality: Right;  L4-5 Decompression with Right Facet Decompression   . MASTECTOMY  2005   right  d/t breast cancer;no sticks to right arm LYMPH NODES REMOVED.  Marland Kitchen RETINAL DETACHMENT SURGERY  2004  . right breast biopsy  2005  . TUBAL LIGATION  1980    There were no vitals filed for this visit.      Subjective Assessment - 07/11/16 1020    Subjective Feels that exercises are helping, can notice a difference. Exercise given last time for HA seems to help. Notices ROM is significantly improved in daily activities   Patient Stated Goals turning head to drive, sleep through the night   Currently in Pain? Yes   Pain Score 3    Pain Location Neck   Pain Orientation Right;Left   Pain Radiating Towards occipital region, bilateral shoulders            [facility] PT Assessment - 07/11/16 0001      Observation/Other Assessments   Focus on Therapeutic Outcomes (FOTO)  52% ability                     [facility] Adult PT Treatment/Exercise - 07/11/16 0001      Neck Exercises: Supine   Other Supine Exercise decompression series  Shoulder Exercises: Supine   External Rotation 20 reps   Theraband Level (Shoulder External Rotation) Level 1 (Yellow)     Shoulder Exercises: Standing   ABduction Limitations snow angels at wall with chin tucks   Other Standing Exercises wall push ups, yellow tband at wrists   Other Standing Exercises yellow tband at wrist, hands on wall, walks     Moist Heat Therapy   Number Minutes Moist Heat 10 Minutes   Moist Heat Location Cervical                PT Education - Jul 21, 2016 1041    Education provided Yes   Education Details exercise form/rationale   Person(s) Educated Patient   Methods Explanation;Demonstration;Tactile cues;Verbal cues   Comprehension Verbalized understanding;Returned demonstration;Verbal cues required;Tactile cues required;Need further instruction          PT Short Term  Goals - 21-Jul-2016 1311      PT SHORT TERM GOAL #1   Title Pt will be indpendent with HEP as it has been established by 9/1   Status Achieved     PT SHORT TERM GOAL #2   Title Able to demonstrate appropriate scapular retraction in conjunction with cervical retraction without cues   Baseline able to perform with discussion of sensation of muscular contraction, no execution cues provided   Status Achieved     PT SHORT TERM GOAL #3   Title Pt will verbalize average pain <=3/10   Status On-going           PT Long Term Goals - 07/09/16 1800      PT LONG TERM GOAL #1   Title Pt will demonstrate increased AROM by 10 deg cervical rotation bilaterally to improve functional motion while driving by [id number]   Baseline rotation improving, not yet 10 degrees, see objective information.   Time 6   Period Weeks   Status On-going     PT LONG TERM GOAL #2   Title Pt will be able to sleep through the night without being woken by occipital HA pain   Baseline wakes 2 X a night,  improving   Time 6   Period Weeks   Status On-going     PT LONG TERM GOAL #3   Title Pt will be independent with final HEP in order to continue strengthening and challenging endurance following formal PT.   Time 6   Period Weeks   Status On-going     PT LONG TERM GOAL #4   Title FOTO score to 54% ability to indicate significant functional improvement   Time 6   Period Weeks   Status Unable to assess               Plan - 07-21-2016 1308    Clinical Impression Statement Pt required frequent cuing today with exercises but was able to perform decompression exercises with reports of decreased pain following. Will continue to benefit from postural training for decompression. Instructed to try a different pillow at night time.    PT Next Visit Plan periscapular stabilization, decompression   Consulted and Agree with Plan of Care Patient      Patient will benefit from skilled therapeutic intervention in order to  improve the following deficits and impairments:     Visit Diagnosis: Cervicalgia  Muscle weakness (generalized)       G-Codes - July 21, 2016 1312    Functional Assessment Tool Used FOTO 52% ability (48% disability), clinical judgement   Functional Limitation Mobility: Walking and  moving around   Mobility: Walking and Moving Around Current Status [id number]) At least 40 percent but less than 60 percent impaired, limited or restricted   Mobility: Walking and Moving Around Goal Status [phone]) At least 20 percent but less than 40 percent impaired, limited or restricted      Problem List Patient Active Problem List   Diagnosis Date Noted  . Malignant neoplasm of right female breast ([place]) 01/23/2016  . History of chemotherapy 01/23/2016  . High risk medication use 07/29/2015  . Screening mammogram for high-risk patient 07/29/2015  . Estrogen deficiency 07/29/2015  . 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/24/2015  . Dense breast tissue 01/24/2015  . Hx of adenomatous colonic polyps 01/24/2015  . Plantar fasciitis, bilateral 01/24/2015  . Breast cancer, right breast ([place]) 07/15/2013  . Syncope 09/15/2012  . Hypokalemia 09/15/2012  . Chronic back pain   . Chronic urinary tract infection   . Hypertension   . Arthritis   . Synovial cyst of lumbar facet joint 11/01/2011    Nobie Alleyne C. Adalee Kathan PT, DPT 07/11/16 1:16 PM   [facility] [address] [place], [place], [address] Phone: [phone]   Fax:  [phone]  Name: Barbara Rangel MRN: [id number] Date of Birth: [birth year]

## 2016-07-16 ENCOUNTER — Ambulatory Visit: Payer: Medicare Other | Attending: Physical Medicine and Rehabilitation | Admitting: Physical Therapy

## 2016-07-16 DIAGNOSIS — R413 Other amnesia: Secondary | ICD-10-CM | POA: Diagnosis not present

## 2016-07-16 DIAGNOSIS — M6281 Muscle weakness (generalized): Secondary | ICD-10-CM | POA: Diagnosis not present

## 2016-07-16 DIAGNOSIS — M542 Cervicalgia: Secondary | ICD-10-CM

## 2016-07-16 DIAGNOSIS — M545 Low back pain: Secondary | ICD-10-CM | POA: Insufficient documentation

## 2016-07-16 NOTE — Patient Instructions (Signed)
Push-Up, Wall    Inhaling, bend elbows and bring chest toward the wall. Exhaling, straighten arms. Repeat __10_ times. Do _1__ times per day.  Copyright  VHI. All rights reserved.

## 2016-07-16 NOTE — Therapy (Signed)
[place], [place], [address] Phone: [phone]   Fax:  [phone]  Physical Therapy Treatment  Patient Details  Name: Barbara Rangel MRN: [id number] Date of Birth: Jul 06, 1946 Referring Provider: Janice Coffin. Nelva Bush, MD  Encounter Date: 07/16/2016      PT End of Session - 07/16/16 1758    Visit Number 8   Number of Visits 13   Date for PT Re-Evaluation 08/02/16   PT Start Time 1019   PT Stop Time 1119   PT Time Calculation (min) 60 min   Activity Tolerance Patient tolerated treatment well   Behavior During Therapy [facility] for tasks assessed/performed      Past Medical History:  Diagnosis Date  . Arthritis    degenerative arthritis  . Cancer ([place])    hx breast cancer-right  . Chronic back pain    right radicular leg pain  . Chronic urinary tract infection    takes Macrodantin and Cranberry daily  . Constipation    r/t pain meds and takes Dulcolax nightly  . FHx: colonic polyps    hx of and mother had colon cancer  . Glaucoma    uses Xalantan nightly  . Hypertension    takes Amlodipine daily  . Other and unspecified general anesthetics causing adverse effect in therapeutic use    hard to wake up  . PONV (postoperative nausea and vomiting)     Past Surgical History:  Procedure Laterality Date  . ANKLE SURGERY  2011   left ankle with screws and plates  . BREAST LUMPECTOMY     x2  . cataract surgery  2005   right eye  . COLONOSCOPY    . COLONOSCOPY WITH PROPOFOL N/A 10/11/2014   Procedure: COLONOSCOPY WITH PROPOFOL;  Surgeon: Garlan Fair, MD;  [facility];  Service: Endoscopy;  Laterality: N/A;  . EYE SURGERY  2004   right eye-detached retina  . left breast biopsy  2009  . LUMBAR LAMINECTOMY/DECOMPRESSION MICRODISCECTOMY  10/31/2011   Procedure: LUMBAR LAMINECTOMY/DECOMPRESSION MICRODISCECTOMY;  Surgeon: Dahlia Bailiff;  Location: [facility];  Service: Orthopedics;   Laterality: Right;  L4-5 Decompression with Right Facet Decompression   . MASTECTOMY  2005   right  d/t breast cancer;no sticks to right arm LYMPH NODES REMOVED.  Marland Kitchen RETINAL DETACHMENT SURGERY  2004  . right breast biopsy  2005  . TUBAL LIGATION  1980    There were no vitals filed for this visit.      Subjective Assessment - 07/16/16 1024    Subjective has a headach,  and nerve pain   MD is making a referral with Neurologist,  Fascial twitched daily and headackes every day.    Currently in Pain? Yes   Pain Score 3    Pain Location Neck   Pain Orientation Right;Left   Pain Descriptors / Indicators Headache;Burning  Scratchy nerve pain,  lower back goes up back.   burning across shoulders.     Pain Radiating Towards up back   Pain Frequency Intermittent   Aggravating Factors  driving ,, sitting, standing   Pain Relieving Factors not sure   Effect of Pain on Daily Activities sometimes wakes.                          [facility] Adult PT Treatment/Exercise - 07/16/16 0001      Neck Exercises: Standing   Wall Push Ups 10 reps  toes 12  inchews from wall   Other Standing Exercises bands, red , row 10 X,  ER 10 X     Neck Exercises: Sidelying   Other Sidelying Exercise 10 X book openers each side,   no pain  Stiffer sidelying left.      Shoulder Exercises: Sidelying   Other Sidelying Exercises bookopener 10 X each side,  Decreased ROM sidelying Left     Shoulder Exercises: Standing   External Rotation 10 reps   Theraband Level (Shoulder External Rotation) Level 1 (Yellow)   Row 10 reps   Theraband Level (Shoulder Row) Level 1 (Yellow)   Retraction 10 reps  5 seconds   Other Standing Exercises wall push ups 10 X HEP   Other Standing Exercises neck retraction 5 X 5 seconds     Moist Heat Therapy   Number Minutes Moist Heat 10 Minutes   Moist Heat Location Cervical     Manual Therapy   Manual Therapy Soft tissue mobilization   Manual therapy comments sub  occiptal release, upper trap and paraspinal soft tissue,  neuromuscular trigger point release.  tissue softened post manual  paraspinals sore today post Book opener per patient                PT Education - 07/16/16 1757    Education Details wall push up   Person(s) Educated Patient   Methods Explanation;Demonstration;Verbal cues;Handout   Comprehension Verbalized understanding;Returned demonstration          PT Short Term Goals - 07/16/16 1801      PT SHORT TERM GOAL #1   Title Pt will be indpendent with HEP as it has been established by 9/1   Time 3   Period Weeks   Status Achieved     PT SHORT TERM GOAL #2   Title Able to demonstrate appropriate scapular retraction in conjunction with cervical retraction without cues   Time 3   Period Weeks   Status Achieved     PT SHORT TERM GOAL #3   Title Pt will verbalize average pain <=3/10   Baseline average pain more tham 3/10   Time 3   Period Weeks   Status On-going           PT Long Term Goals - 07/09/16 1800      PT LONG TERM GOAL #1   Title Pt will demonstrate increased AROM by 10 deg cervical rotation bilaterally to improve functional motion while driving by 4/74   Baseline rotation improving, not yet 10 degrees, see objective information.   Time 6   Period Weeks   Status On-going     PT LONG TERM GOAL #2   Title Pt will be able to sleep through the night without being woken by occipital HA pain   Baseline wakes 2 X a night,  improving   Time 6   Period Weeks   Status On-going     PT LONG TERM GOAL #3   Title Pt will be independent with final HEP in order to continue strengthening and challenging endurance following formal PT.   Time 6   Period Weeks   Status On-going     PT LONG TERM GOAL #4   Title FOTO score to 54% ability to indicate significant functional improvement   Time 6   Period Weeks   Status Unable to assess               Plan - 07/16/16 1759    Clinical Impression  Statement Progress toward home exercise goal,  Pain increased with book opener in paraspinals.  decreased with manual, light, and heat.  NO new golas met.  She says her MD is making an appointment with a Neurologist.  No appointment yet.     PT Next Visit Plan periscapular stabilization, decompression   PT Home Exercise Plan wall push up   Consulted and Agree with Plan of Care Patient      Patient will benefit from skilled therapeutic intervention in order to improve the following deficits and impairments:  Decreased range of motion, Pain, Improper body mechanics, Decreased strength, Postural dysfunction  Visit Diagnosis: Cervicalgia  Muscle weakness (generalized)     Problem List Patient Active Problem List   Diagnosis Date Noted  . Malignant neoplasm of right female breast ([place]) 01/23/2016  . History of chemotherapy 01/23/2016  . High risk medication use 07/29/2015  . Screening mammogram for high-risk patient 07/29/2015  . Estrogen deficiency 07/29/2015  . 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/24/2015  . Dense breast tissue 01/24/2015  . Hx of adenomatous colonic polyps 01/24/2015  . Plantar fasciitis, bilateral 01/24/2015  . Breast cancer, right breast ([place]) 07/15/2013  . Syncope 09/15/2012  . Hypokalemia 09/15/2012  . Chronic back pain   . Chronic urinary tract infection   . Hypertension   . Arthritis   . Synovial cyst of lumbar facet joint 11/01/2011    [facility] 07/16/2016, 6:03 PM  [place], [place], [address] Phone: [phone]   Fax:  [phone]  Name: Barbara Rangel MRN: [id number] Date of Birth: 04-Dec-1945   Melvenia Needles, PTA 07/16/16 6:03 PM Phone: [phone] Fax: [phone]

## 2016-07-18 ENCOUNTER — Ambulatory Visit: Payer: Medicare Other | Admitting: Physical Therapy

## 2016-07-18 ENCOUNTER — Encounter: Payer: Self-pay | Admitting: Physical Therapy

## 2016-07-18 DIAGNOSIS — M542 Cervicalgia: Secondary | ICD-10-CM

## 2016-07-18 DIAGNOSIS — M545 Low back pain: Secondary | ICD-10-CM

## 2016-07-18 DIAGNOSIS — R413 Other amnesia: Secondary | ICD-10-CM | POA: Diagnosis not present

## 2016-07-18 DIAGNOSIS — M6281 Muscle weakness (generalized): Secondary | ICD-10-CM | POA: Diagnosis not present

## 2016-07-18 NOTE — Therapy (Signed)
[place], [place], [address] Phone: [phone]   Fax:  [phone]  Physical Therapy Treatment  Patient Details  Name: Barbara Rangel MRN: [id number] Date of Birth: 1946-03-12 Referring Provider: Janice Coffin. Nelva Bush, MD  Encounter Date: 07/18/2016      PT End of Session - 07/18/16 1026    Visit Number 9   Number of Visits 13   Date for PT Re-Evaluation 08/02/16   Authorization Type Medicare- Kx at visit 15   PT Start Time 1020   PT Stop Time 1109   PT Time Calculation (min) 49 min   Activity Tolerance Patient tolerated treatment well   Behavior During Therapy WFL for tasks assessed/performed      Past Medical History:  Diagnosis Date  . Arthritis    degenerative arthritis  . Cancer ([place])    hx breast cancer-right  . Chronic back pain    right radicular leg pain  . Chronic urinary tract infection    takes Macrodantin and Cranberry daily  . Constipation    r/t pain meds and takes Dulcolax nightly  . FHx: colonic polyps    hx of and mother had colon cancer  . Glaucoma    uses Xalantan nightly  . Hypertension    takes Amlodipine daily  . Other and unspecified general anesthetics causing adverse effect in therapeutic use    hard to wake up  . PONV (postoperative nausea and vomiting)     Past Surgical History:  Procedure Laterality Date  . ANKLE SURGERY  2011   left ankle with screws and plates  . BREAST LUMPECTOMY     x2  . cataract surgery  2005   right eye  . COLONOSCOPY    . COLONOSCOPY WITH PROPOFOL N/A 10/11/2014   Procedure: COLONOSCOPY WITH PROPOFOL;  Surgeon: Garlan Fair, MD;  [facility];  Service: Endoscopy;  Laterality: N/A;  . EYE SURGERY  2004   right eye-detached retina  . left breast biopsy  2009  . LUMBAR LAMINECTOMY/DECOMPRESSION MICRODISCECTOMY  10/31/2011   Procedure: LUMBAR LAMINECTOMY/DECOMPRESSION MICRODISCECTOMY;  Surgeon: Dahlia Bailiff;   Location: [facility];  Service: Orthopedics;  Laterality: Right;  L4-5 Decompression with Right Facet Decompression   . MASTECTOMY  2005   right  d/t breast cancer;no sticks to right arm LYMPH NODES REMOVED.  Marland Kitchen RETINAL DETACHMENT SURGERY  2004  . right breast biopsy  2005  . TUBAL LIGATION  1980    There were no vitals filed for this visit.      Subjective Assessment - 07/18/16 1021    Subjective Pt reports neck feels "pretty good"  has most stiffness/pain in the morning and when turning head.    Patient Stated Goals turning head to drive, sleep through the night   Currently in Pain? Yes   Pain Score 4    Pain Location Neck   Pain Orientation Right   Pain Radiating Towards suboccipital                         [facility] Adult PT Treatment/Exercise - 07/18/16 0001      Neck Exercises: Seated   Neck Retraction 10 reps   Neck Retraction Limitations for suboccipital stretch     Neck Exercises: Supine   Other Supine Exercise decompression series     Shoulder Exercises: Supine   External Rotation 20 reps  cuing for cervical posture   Theraband Level (Shoulder External  Rotation) Level 1 (Yellow)   Other Supine Exercises diagonals with chin tuch yellow tband     Shoulder Exercises: ROM/Strengthening   UBE (Upper Arm Bike) retro 3' L1     Moist Heat Therapy   Number Minutes Moist Heat 10 Minutes   Moist Heat Location Cervical                PT Education - 07/18/16 1024    Education provided Yes   Education Details morning soreness, exercise form/rationale   Person(s) Educated Patient   Methods Explanation;Demonstration;Tactile cues;Verbal cues   Comprehension Verbalized understanding;Returned demonstration;Verbal cues required;Tactile cues required;Need further instruction          PT Short Term Goals - 07/16/16 1801      PT SHORT TERM GOAL #1   Title Pt will be indpendent with HEP as it has been established by 9/1   Time 3   Period Weeks   Status  Achieved     PT SHORT TERM GOAL #2   Title Able to demonstrate appropriate scapular retraction in conjunction with cervical retraction without cues   Time 3   Period Weeks   Status Achieved     PT SHORT TERM GOAL #3   Title Pt will verbalize average pain <=3/10   Baseline average pain more tham 3/10   Time 3   Period Weeks   Status On-going           PT Long Term Goals - 07/09/16 1800      PT LONG TERM GOAL #1   Title Pt will demonstrate increased AROM by 10 deg cervical rotation bilaterally to improve functional motion while driving by [id number]   Baseline rotation improving, not yet 10 degrees, see objective information.   Time 6   Period Weeks   Status On-going     PT LONG TERM GOAL #2   Title Pt will be able to sleep through the night without being woken by occipital HA pain   Baseline wakes 2 X a night,  improving   Time 6   Period Weeks   Status On-going     PT LONG TERM GOAL #3   Title Pt will be independent with final HEP in order to continue strengthening and challenging endurance following formal PT.   Time 6   Period Weeks   Status On-going     PT LONG TERM GOAL #4   Title FOTO score to 54% ability to indicate significant functional improvement   Time 6   Period Weeks   Status Unable to assess               Plan - 07/18/16 1049    Clinical Impression Statement Pt continues to require heavy cuing and frequently asks if she is performing exercises correctly. Pt was educated regarding importance of being body-aware so she is able to correct her posture.    PT Next Visit Plan periscapular stabilization, decompression   Consulted and Agree with Plan of Care Patient      Patient will benefit from skilled therapeutic intervention in order to improve the following deficits and impairments:     Visit Diagnosis: Cervicalgia  Muscle weakness (generalized)  Bilateral low back pain, with sciatica presence unspecified     Problem List Patient  Active Problem List   Diagnosis Date Noted  . Malignant neoplasm of right female breast ([place]) 01/23/2016  . History of chemotherapy 01/23/2016  . High risk medication use 07/29/2015  . Screening mammogram for  high-risk patient 07/29/2015  . Estrogen deficiency 07/29/2015  . 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/24/2015  . Dense breast tissue 01/24/2015  . Hx of adenomatous colonic polyps 01/24/2015  . Plantar fasciitis, bilateral 01/24/2015  . Breast cancer, right breast ([place]) 07/15/2013  . Syncope 09/15/2012  . Hypokalemia 09/15/2012  . Chronic back pain   . Chronic urinary tract infection   . Hypertension   . Arthritis   . Synovial cyst of lumbar facet joint 11/01/2011    Serena Petterson C. Brigg Cape PT, DPT 07/18/16 11:25 AM   [facility] [address] [place], [place], [address] Phone: [phone]   Fax:  [phone]  Name: Barbara Rangel MRN: [id number] Date of Birth: 01-25-1946

## 2016-07-22 ENCOUNTER — Other Ambulatory Visit: Payer: Medicare Other

## 2016-07-22 ENCOUNTER — Ambulatory Visit: Payer: Medicare Other | Admitting: Oncology

## 2016-07-23 ENCOUNTER — Ambulatory Visit: Payer: Medicare Other | Admitting: Physical Therapy

## 2016-07-23 DIAGNOSIS — R413 Other amnesia: Secondary | ICD-10-CM | POA: Diagnosis not present

## 2016-07-23 DIAGNOSIS — M545 Low back pain: Secondary | ICD-10-CM | POA: Diagnosis not present

## 2016-07-23 DIAGNOSIS — M542 Cervicalgia: Secondary | ICD-10-CM

## 2016-07-23 DIAGNOSIS — M6281 Muscle weakness (generalized): Secondary | ICD-10-CM

## 2016-07-23 NOTE — Therapy (Signed)
[place], [place], [address] Phone: [phone]   Fax:  [phone]  Physical Therapy Treatment  Patient Details  Name: Barbara Rangel MRN: [id number] Date of Birth: [birth year] Referring Provider: Janice Coffin. Nelva Bush, MD  Encounter Date: 07/23/2016      PT End of Session - 07/23/16 1800    Visit Number 10   Number of Visits 13   Date for PT Re-Evaluation 08/02/16   PT Start Time 1016   PT Stop Time 1111   PT Time Calculation (min) 55 min   Activity Tolerance Patient tolerated treatment well   Behavior During Therapy [facility] for tasks assessed/performed      Past Medical History:  Diagnosis Date  . Arthritis    degenerative arthritis  . Cancer ([place])    hx breast cancer-right  . Chronic back pain    right radicular leg pain  . Chronic urinary tract infection    takes Macrodantin and Cranberry daily  . Constipation    r/t pain meds and takes Dulcolax nightly  . FHx: colonic polyps    hx of and mother had colon cancer  . Glaucoma    uses Xalantan nightly  . Hypertension    takes Amlodipine daily  . Other and unspecified general anesthetics causing adverse effect in therapeutic use    hard to wake up  . PONV (postoperative nausea and vomiting)     Past Surgical History:  Procedure Laterality Date  . ANKLE SURGERY  2011   left ankle with screws and plates  . BREAST LUMPECTOMY     x2  . cataract surgery  2005   right eye  . COLONOSCOPY    . COLONOSCOPY WITH PROPOFOL N/A 10/11/2014   Procedure: COLONOSCOPY WITH PROPOFOL;  Surgeon: Garlan Fair, MD;  Location: [facility];  Service: Endoscopy;  Laterality: N/A;  . EYE SURGERY  2004   right eye-detached retina  . left breast biopsy  2009  . LUMBAR LAMINECTOMY/DECOMPRESSION MICRODISCECTOMY  10/31/2011   Procedure: LUMBAR LAMINECTOMY/DECOMPRESSION MICRODISCECTOMY;  Surgeon: Dahlia Bailiff;  Location: [place];  Service: Orthopedics;   Laterality: Right;  L4-5 Decompression with Right Facet Decompression   . MASTECTOMY  2005   right  d/t breast cancer;no sticks to right arm LYMPH NODES REMOVED.  Marland Kitchen RETINAL DETACHMENT SURGERY  2004  . right breast biopsy  2005  . TUBAL LIGATION  1980    There were no vitals filed for this visit.      Subjective Assessment - 07/23/16 1804    Currently in Pain? Yes   Pain Score 4    Pain Location Neck   Pain Orientation Right   Pain Descriptors / Indicators Burning;Headache   Pain Type Chronic pain   Pain Radiating Towards upper trap   Aggravating Factors  driving, sitting, standing   Pain Relieving Factors heat,                          [facility] Adult PT Treatment/Exercise - 07/23/16 0001      Neck Exercises: Supine   Neck Retraction Limitations 5  hand under head for feed back     Shoulder Exercises: Supine   Other Supine Exercises Decompression  5 x 5 seconds with 5 minute decompression, pillows     Shoulder Exercises: Seated   External Rotation 10 reps   Theraband Level (Shoulder External Rotation) Level 2 (Red)     Shoulder Exercises: Prone  Retraction --  10 rt, 8 left with 3 pounds,  0 LBS x 10 each, guided initia   Retraction Weight (lbs) 0,3   Retraction Limitations 4/10 pain upper trap     Shoulder Exercises: Standing   Extension Right;Left;10 reps   Row 15 reps     Shoulder Exercises: ROM/Strengthening   UBE (Upper Arm Bike) 6 minutes. l1, reto     Shoulder Exercises: Stretch   Other Shoulder Stretches upper trap and levater stretch 1 each 15 seconds     Moist Heat Therapy   Number Minutes Moist Heat 10 Minutes   Moist Heat Location Cervical  and low back,  extra layer needed for cervical                  PT Short Term Goals - 07/16/16 1801      PT SHORT TERM GOAL #1   Title Pt will be indpendent with HEP as it has been established by 9/1   Time 3   Period Weeks   Status Achieved     PT SHORT TERM GOAL #2   Title  Able to demonstrate appropriate scapular retraction in conjunction with cervical retraction without cues   Time 3   Period Weeks   Status Achieved     PT SHORT TERM GOAL #3   Title Pt will verbalize average pain <=3/10   Baseline average pain more tham 3/10   Time 3   Period Weeks   Status On-going           PT Long Term Goals - 07/09/16 1800      PT LONG TERM GOAL #1   Title Pt will demonstrate increased AROM by 10 deg cervical rotation bilaterally to improve functional motion while driving by [id number]   Baseline rotation improving, not yet 10 degrees, see objective information.   Time 6   Period Weeks   Status On-going     PT LONG TERM GOAL #2   Title Pt will be able to sleep through the night without being woken by occipital HA pain   Baseline wakes 2 X a night,  improving   Time 6   Period Weeks   Status On-going     PT LONG TERM GOAL #3   Title Pt will be independent with final HEP in order to continue strengthening and challenging endurance following formal PT.   Time 6   Period Weeks   Status On-going     PT LONG TERM GOAL #4   Title FOTO score to 54% ability to indicate significant functional improvement   Time 6   Period Weeks   Status Unable to assess               Plan - 07/23/16 1801    Clinical Impression Statement Focus today on stabilization/ strengthening.  She had a flare of upper trap pain with 3 pound shoulder retraction  while leaning on counter,  Less pain noted with decompression exercises,  with upper trapm rexalation with posture.     PT Next Visit Plan periscapular stabilization, decompression  progress as able   PT Home Exercise Plan Continue,     Consulted and Agree with Plan of Care Patient      Patient will benefit from skilled therapeutic intervention in order to improve the following deficits and impairments:  Decreased range of motion, Pain, Improper body mechanics, Decreased strength, Postural dysfunction  Visit  Diagnosis: Cervicalgia  Muscle weakness (generalized)  Bilateral low back  pain, with sciatica presence unspecified  Complaints of memory disturbance     Problem List Patient Active Problem List   Diagnosis Date Noted  . Malignant neoplasm of right female breast ([place]) 01/23/2016  . History of chemotherapy 01/23/2016  . High risk medication use 07/29/2015  . Screening mammogram for high-risk patient 07/29/2015  . Estrogen deficiency 07/29/2015  . 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/24/2015  . Dense breast tissue 01/24/2015  . Hx of adenomatous colonic polyps 01/24/2015  . Plantar fasciitis, bilateral 01/24/2015  . Breast cancer, right breast ([place]) 07/15/2013  . Syncope 09/15/2012  . Hypokalemia 09/15/2012  . Chronic back pain   . Chronic urinary tract infection   . Hypertension   . Arthritis   . Synovial cyst of lumbar facet joint 11/01/2011    [facility] 07/23/2016, 6:07 PM  [place], [place], [address] Phone: [phone]   Fax:  [phone]  Name: Barbara Rangel MRN: [id number] Date of Birth: 1946/07/11   Melvenia Needles, PTA 07/23/16 6:07 PM Phone: [phone] Fax: [phone]

## 2016-07-25 ENCOUNTER — Ambulatory Visit: Payer: Medicare Other | Admitting: Physical Therapy

## 2016-07-30 ENCOUNTER — Ambulatory Visit: Payer: Medicare Other | Admitting: Physical Therapy

## 2016-07-30 ENCOUNTER — Encounter: Payer: Self-pay | Admitting: Physical Therapy

## 2016-07-30 DIAGNOSIS — M6281 Muscle weakness (generalized): Secondary | ICD-10-CM

## 2016-07-30 DIAGNOSIS — M542 Cervicalgia: Secondary | ICD-10-CM | POA: Diagnosis not present

## 2016-07-30 DIAGNOSIS — R413 Other amnesia: Secondary | ICD-10-CM | POA: Diagnosis not present

## 2016-07-30 DIAGNOSIS — M545 Low back pain: Secondary | ICD-10-CM | POA: Diagnosis not present

## 2016-07-30 NOTE — Therapy (Signed)
[place], [place], [address] Phone: [phone]   Fax:  [phone]  Physical Therapy Treatment/Discharge Summary  Patient Details  Name: Barbara Rangel MRN: [id number] Date of Birth: 1946-08-05 Referring Provider: Janice Coffin. Nelva Bush, MD  Encounter Date: 07/30/2016      PT End of Session - 07/30/16 1023    Visit Number 11   Number of Visits 13   Date for PT Re-Evaluation 08/02/16   Authorization Type Medicare- Kx at visit 15   PT Start Time 1015   PT Stop Time 1102   PT Time Calculation (min) 47 min      Past Medical History:  Diagnosis Date  . Arthritis    degenerative arthritis  . Cancer ([place])    hx breast cancer-right  . Chronic back pain    right radicular leg pain  . Chronic urinary tract infection    takes Macrodantin and Cranberry daily  . Constipation    r/t pain meds and takes Dulcolax nightly  . FHx: colonic polyps    hx of and mother had colon cancer  . Glaucoma    uses Xalantan nightly  . Hypertension    takes Amlodipine daily  . Other and unspecified general anesthetics causing adverse effect in therapeutic use    hard to wake up  . PONV (postoperative nausea and vomiting)     Past Surgical History:  Procedure Laterality Date  . ANKLE SURGERY  2011   left ankle with screws and plates  . BREAST LUMPECTOMY     x2  . cataract surgery  2005   right eye  . COLONOSCOPY    . COLONOSCOPY WITH PROPOFOL N/A 10/11/2014   Procedure: COLONOSCOPY WITH PROPOFOL;  Surgeon: Garlan Fair, MD;  Location: [facility];  Service: Endoscopy;  Laterality: N/A;  . EYE SURGERY  2004   right eye-detached retina  . left breast biopsy  2009  . LUMBAR LAMINECTOMY/DECOMPRESSION MICRODISCECTOMY  10/31/2011   Procedure: LUMBAR LAMINECTOMY/DECOMPRESSION MICRODISCECTOMY;  Surgeon: Dahlia Bailiff;  Location: [place];  Service: Orthopedics;  Laterality: Right;  L4-5 Decompression with Right Facet  Decompression   . MASTECTOMY  2005   right  d/t breast cancer;no sticks to right arm LYMPH NODES REMOVED.  Marland Kitchen RETINAL DETACHMENT SURGERY  2004  . right breast biopsy  2005  . TUBAL LIGATION  1980    There were no vitals filed for this visit.      Subjective Assessment - 07/30/16 1020    Subjective Overall feeling "pretty good" seems to go well for a while and then something comes back and eases again.    Currently in Pain? Yes   Pain Score 3    Pain Location Neck   Pain Orientation Mid   Pain Descriptors / Indicators Sore            [facility] PT Assessment - 07/30/16 0001      AROM   Cervical Flexion 66  c6 resting at 36 deg (meas with goni)   Cervical - Right Side Bend 24   Cervical - Left Side Bend 20   Cervical - Right Rotation 40   Cervical - Left Rotation 35     Strength   Right Shoulder Flexion 4/5   Right Shoulder ABduction 4+/5   Right Shoulder Internal Rotation 5/5   Right Shoulder External Rotation 4+/5   Left Shoulder Flexion 4-/5   Left Shoulder ABduction 4/5   Left Shoulder Internal Rotation 5/5  Left Shoulder External Rotation 4+/5                             PT Education - 17-Aug-2016 1235    Education provided Yes   Education Details HEP, group exercise classes following d/c   Person(s) Educated Patient   Methods Explanation;Handout   Comprehension Verbalized understanding          PT Short Term Goals - 07/16/16 1801      PT SHORT TERM GOAL #1   Title Pt will be indpendent with HEP as it has been established by 9/1   Time 3   Period Weeks   Status Achieved     PT SHORT TERM GOAL #2   Title Able to demonstrate appropriate scapular retraction in conjunction with cervical retraction without cues   Time 3   Period Weeks   Status Achieved     PT SHORT TERM GOAL #3   Title Pt will verbalize average pain <=3/10   Baseline average pain more tham 3/10   Time 3   Period Weeks   Status On-going           PT Long  Term Goals - 2016-08-17 1026      PT LONG TERM GOAL #1   Title Pt will demonstrate increased AROM by 10 deg cervical rotation bilaterally to improve functional motion while driving by 7/09   Status Achieved     PT LONG TERM GOAL #2   Title Pt will be able to sleep through the night without being woken by occipital HA pain   Status Achieved     PT LONG TERM GOAL #3   Title Pt will be independent with final HEP in order to continue strengthening and challenging endurance following formal PT.   Status Achieved     PT LONG TERM GOAL #4   Title FOTO score to 54% ability to indicate significant functional improvement   Baseline 50% ability at d/c   Status Not Met               Plan - 08-17-2016 1236    Clinical Impression Statement Pt has made significant improvements in strength, ROM and functional ability in her cervical spine and will be d/c from skilled care at this time. Discussion held today to review HEP as well as discuss other exercise options such as yoga and tai chi. Pt was instructed to contact us with any further questions or concernes.    PT Treatment/Interventions ADLs/Self Care Home Management;Cryotherapy;Moist Heat;Traction;Therapeutic activities;Therapeutic exercise;Neuromuscular re-education;Patient/family education;Passive range of motion;Manual techniques;Dry needling;Taping;Electrical Stimulation   Consulted and Agree with Plan of Care Patient      Patient will benefit from skilled therapeutic intervention in order to improve the following deficits and impairments:  Decreased range of motion, Pain, Improper body mechanics, Decreased strength, Postural dysfunction  Visit Diagnosis: Cervicalgia  Muscle weakness (generalized)       G-Codes - 2016-08-17 1243    Functional Assessment Tool Used FOTO 50% ability   Functional Limitation Mobility: Walking and moving around   Mobility: Walking and Moving Around Goal Status [phone]) At least 20 percent but less than 40  percent impaired, limited or restricted   Mobility: Walking and Moving Around Discharge Status [phone]) At least 20 percent but less than 40 percent impaired, limited or restricted      Problem List Patient Active Problem List   Diagnosis Date Noted  . Malignant neoplasm of  right female breast ([place]) 01/23/2016  . History of chemotherapy 01/23/2016  . High risk medication use 07/29/2015  . Screening mammogram for high-risk patient 07/29/2015  . Estrogen deficiency 07/29/2015  . 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/24/2015  . Dense breast tissue 01/24/2015  . Hx of adenomatous colonic polyps 01/24/2015  . Plantar fasciitis, bilateral 01/24/2015  . Breast cancer, right breast ([place]) 07/15/2013  . Syncope 09/15/2012  . Hypokalemia 09/15/2012  . Chronic back pain   . Chronic urinary tract infection   . Hypertension   . Arthritis   . Synovial cyst of lumbar facet joint 11/01/2011    [place]ima Chillemi C. Tawania Daponte PT, DPT 07/30/16 12:45 PM  PHYSICAL THERAPY DISCHARGE SUMMARY  Visits from Start of Care: 11  Current functional level related to goals / functional outcomes: See above   Remaining deficits: See above   Education / Equipment: Anatomy of condition, POC,HEP, exercise form/rationale  Plan: Patient agrees to discharge.  Patient goals were partially met. Patient is being discharged due to being pleased with the current functional level.  ?????      [place], [place], [address] Phone: [phone]   Fax:  [phone]  Name: Barbara Rangel MRN: [id number] Date of Birth: 1945-12-13

## 2016-07-31 DIAGNOSIS — M50822 Other cervical disc disorders at C5-C6 level: Secondary | ICD-10-CM | POA: Diagnosis not present

## 2016-07-31 DIAGNOSIS — S134XXS Sprain of ligaments of cervical spine, sequela: Secondary | ICD-10-CM | POA: Diagnosis not present

## 2016-07-31 DIAGNOSIS — M50823 Other cervical disc disorders at C6-C7 level: Secondary | ICD-10-CM | POA: Diagnosis not present

## 2016-07-31 DIAGNOSIS — M50821 Other cervical disc disorders at C4-C5 level: Secondary | ICD-10-CM | POA: Diagnosis not present

## 2016-08-01 DIAGNOSIS — R278 Other lack of coordination: Secondary | ICD-10-CM | POA: Diagnosis not present

## 2016-08-01 DIAGNOSIS — R102 Pelvic and perineal pain: Secondary | ICD-10-CM | POA: Diagnosis not present

## 2016-08-01 DIAGNOSIS — M62838 Other muscle spasm: Secondary | ICD-10-CM | POA: Diagnosis not present

## 2016-08-01 DIAGNOSIS — M6281 Muscle weakness (generalized): Secondary | ICD-10-CM | POA: Diagnosis not present

## 2016-08-01 DIAGNOSIS — N3281 Overactive bladder: Secondary | ICD-10-CM | POA: Diagnosis not present

## 2016-08-05 DIAGNOSIS — M5136 Other intervertebral disc degeneration, lumbar region: Secondary | ICD-10-CM | POA: Diagnosis not present

## 2016-08-06 DIAGNOSIS — M62838 Other muscle spasm: Secondary | ICD-10-CM | POA: Diagnosis not present

## 2016-08-06 DIAGNOSIS — R102 Pelvic and perineal pain: Secondary | ICD-10-CM | POA: Diagnosis not present

## 2016-08-06 DIAGNOSIS — R278 Other lack of coordination: Secondary | ICD-10-CM | POA: Diagnosis not present

## 2016-08-06 DIAGNOSIS — M6281 Muscle weakness (generalized): Secondary | ICD-10-CM | POA: Diagnosis not present

## 2016-08-08 DIAGNOSIS — F4322 Adjustment disorder with anxiety: Secondary | ICD-10-CM | POA: Diagnosis not present

## 2016-08-08 DIAGNOSIS — F4542 Pain disorder with related psychological factors: Secondary | ICD-10-CM | POA: Diagnosis not present

## 2016-08-09 DIAGNOSIS — H401131 Primary open-angle glaucoma, bilateral, mild stage: Secondary | ICD-10-CM | POA: Diagnosis not present

## 2016-08-12 DIAGNOSIS — F419 Anxiety disorder, unspecified: Secondary | ICD-10-CM | POA: Diagnosis not present

## 2016-08-12 DIAGNOSIS — I779 Disorder of arteries and arterioles, unspecified: Secondary | ICD-10-CM | POA: Diagnosis not present

## 2016-08-12 DIAGNOSIS — C50911 Malignant neoplasm of unspecified site of right female breast: Secondary | ICD-10-CM | POA: Diagnosis not present

## 2016-08-12 DIAGNOSIS — E785 Hyperlipidemia, unspecified: Secondary | ICD-10-CM | POA: Diagnosis not present

## 2016-08-12 DIAGNOSIS — Z23 Encounter for immunization: Secondary | ICD-10-CM | POA: Diagnosis not present

## 2016-08-12 DIAGNOSIS — I10 Essential (primary) hypertension: Secondary | ICD-10-CM | POA: Diagnosis not present

## 2016-08-15 DIAGNOSIS — M62838 Other muscle spasm: Secondary | ICD-10-CM | POA: Diagnosis not present

## 2016-08-15 DIAGNOSIS — R278 Other lack of coordination: Secondary | ICD-10-CM | POA: Diagnosis not present

## 2016-08-15 DIAGNOSIS — N3281 Overactive bladder: Secondary | ICD-10-CM | POA: Diagnosis not present

## 2016-08-15 DIAGNOSIS — M6281 Muscle weakness (generalized): Secondary | ICD-10-CM | POA: Diagnosis not present

## 2016-08-15 DIAGNOSIS — R102 Pelvic and perineal pain: Secondary | ICD-10-CM | POA: Diagnosis not present

## 2016-08-20 DIAGNOSIS — M5136 Other intervertebral disc degeneration, lumbar region: Secondary | ICD-10-CM | POA: Diagnosis not present

## 2016-08-25 ENCOUNTER — Other Ambulatory Visit: Payer: Self-pay | Admitting: Oncology

## 2016-08-25 DIAGNOSIS — Z170 Estrogen receptor positive status [ER+]: Principal | ICD-10-CM

## 2016-08-25 DIAGNOSIS — C50911 Malignant neoplasm of unspecified site of right female breast: Secondary | ICD-10-CM

## 2016-08-26 ENCOUNTER — Ambulatory Visit (HOSPITAL_BASED_OUTPATIENT_CLINIC_OR_DEPARTMENT_OTHER): Payer: Medicare Other | Admitting: Oncology

## 2016-08-26 ENCOUNTER — Encounter: Payer: Self-pay | Admitting: Oncology

## 2016-08-26 ENCOUNTER — Other Ambulatory Visit (HOSPITAL_BASED_OUTPATIENT_CLINIC_OR_DEPARTMENT_OTHER): Payer: Medicare Other

## 2016-08-26 VITALS — BP 137/73 | HR 88 | Temp 98.4°F | Resp 18 | Ht 65.0 in | Wt 131.3 lb

## 2016-08-26 DIAGNOSIS — M8580 Other specified disorders of bone density and structure, unspecified site: Secondary | ICD-10-CM

## 2016-08-26 DIAGNOSIS — Z853 Personal history of malignant neoplasm of breast: Secondary | ICD-10-CM

## 2016-08-26 DIAGNOSIS — R922 Inconclusive mammogram: Secondary | ICD-10-CM | POA: Diagnosis not present

## 2016-08-26 DIAGNOSIS — Z79899 Other long term (current) drug therapy: Secondary | ICD-10-CM

## 2016-08-26 DIAGNOSIS — E2839 Other primary ovarian failure: Secondary | ICD-10-CM | POA: Diagnosis not present

## 2016-08-26 DIAGNOSIS — Z79811 Long term (current) use of aromatase inhibitors: Secondary | ICD-10-CM

## 2016-08-26 DIAGNOSIS — Z1239 Encounter for other screening for malignant neoplasm of breast: Secondary | ICD-10-CM

## 2016-08-26 DIAGNOSIS — Z170 Estrogen receptor positive status [ER+]: Principal | ICD-10-CM

## 2016-08-26 DIAGNOSIS — C50911 Malignant neoplasm of unspecified site of right female breast: Secondary | ICD-10-CM

## 2016-08-26 LAB — CBC WITH DIFFERENTIAL/PLATELET
BASO%: 0.7 % (ref 0.0–2.0)
Basophils Absolute: 0 10*3/uL (ref 0.0–0.1)
EOS%: 0.8 % (ref 0.0–7.0)
Eosinophils Absolute: 0 10*3/uL (ref 0.0–0.5)
HCT: 46.6 % (ref 34.8–46.6)
HGB: 15.6 g/dL (ref 11.6–15.9)
LYMPH%: 24.2 % (ref 14.0–49.7)
MCH: 32.9 pg (ref 25.1–34.0)
MCHC: 33.4 g/dL (ref 31.5–36.0)
MCV: 98.3 fL (ref 79.5–101.0)
MONO#: 0.4 10*3/uL (ref 0.1–0.9)
MONO%: 9 % (ref 0.0–14.0)
NEUT#: 3 10*3/uL (ref 1.5–6.5)
NEUT%: 65.3 % (ref 38.4–76.8)
Platelets: 225 10*3/uL (ref 145–400)
RBC: 4.74 10*6/uL (ref 3.70–5.45)
RDW: 14.3 % (ref 11.2–14.5)
WBC: 4.6 10*3/uL (ref 3.9–10.3)
lymph#: 1.1 10*3/uL (ref 0.9–3.3)

## 2016-08-26 LAB — COMPREHENSIVE METABOLIC PANEL
ALT: 26 U/L (ref 0–55)
AST: 27 U/L (ref 5–34)
Albumin: 4.2 g/dL (ref 3.5–5.0)
Alkaline Phosphatase: 55 U/L (ref 40–150)
Anion Gap: 9 mEq/L (ref 3–11)
BUN: 15.1 mg/dL (ref 7.0–26.0)
CO2: 26 mEq/L (ref 22–29)
Calcium: 10.5 mg/dL — ABNORMAL HIGH (ref 8.4–10.4)
Chloride: 106 mEq/L (ref 98–109)
Creatinine: 0.8 mg/dL (ref 0.6–1.1)
EGFR: 84 mL/min/{1.73_m2} — ABNORMAL LOW (ref >90)
Glucose: 95 mg/dl (ref 70–140)
Potassium: 4.1 mEq/L (ref 3.5–5.1)
Sodium: 141 mEq/L (ref 136–145)
Total Bilirubin: 0.63 mg/dL (ref 0.20–1.20)
Total Protein: 7.9 g/dL (ref 6.4–8.3)
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
## 2016-08-28 ENCOUNTER — Ambulatory Visit (INDEPENDENT_AMBULATORY_CARE_PROVIDER_SITE_OTHER): Payer: Medicare Other | Admitting: Neurology

## 2016-08-28 ENCOUNTER — Encounter: Payer: Self-pay | Admitting: Neurology

## 2016-08-28 VITALS — BP 122/76 | HR 78 | Resp 14 | Ht 65.0 in | Wt 130.0 lb

## 2016-08-28 DIAGNOSIS — R200 Anesthesia of skin: Secondary | ICD-10-CM

## 2016-08-28 DIAGNOSIS — M8580 Other specified disorders of bone density and structure, unspecified site: Secondary | ICD-10-CM | POA: Insufficient documentation

## 2016-08-28 DIAGNOSIS — Z853 Personal history of malignant neoplasm of breast: Secondary | ICD-10-CM | POA: Insufficient documentation

## 2016-08-28 DIAGNOSIS — R419 Unspecified symptoms and signs involving cognitive functions and awareness: Secondary | ICD-10-CM

## 2016-08-28 DIAGNOSIS — R202 Paresthesia of skin: Secondary | ICD-10-CM | POA: Diagnosis not present
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
## 2016-08-28 NOTE — Patient Instructions (Addendum)
We will monitor your memory.   Exam is stable.   Keep active mentally and physically, hydrate well with water.

## 2016-09-05 DIAGNOSIS — M50822 Other cervical disc disorders at C5-C6 level: Secondary | ICD-10-CM | POA: Diagnosis not present

## 2016-09-05 DIAGNOSIS — S134XXS Sprain of ligaments of cervical spine, sequela: Secondary | ICD-10-CM | POA: Diagnosis not present

## 2016-09-05 DIAGNOSIS — M5136 Other intervertebral disc degeneration, lumbar region: Secondary | ICD-10-CM | POA: Diagnosis not present

## 2016-09-05 DIAGNOSIS — M50823 Other cervical disc disorders at C6-C7 level: Secondary | ICD-10-CM | POA: Diagnosis not present

## 2016-09-06 DIAGNOSIS — H401131 Primary open-angle glaucoma, bilateral, mild stage: Secondary | ICD-10-CM | POA: Diagnosis not present

## 2016-09-11 DIAGNOSIS — N3281 Overactive bladder: Secondary | ICD-10-CM | POA: Diagnosis not present

## 2016-09-11 DIAGNOSIS — M6281 Muscle weakness (generalized): Secondary | ICD-10-CM | POA: Diagnosis not present

## 2016-09-11 DIAGNOSIS — M62838 Other muscle spasm: Secondary | ICD-10-CM | POA: Diagnosis not present

## 2016-09-11 DIAGNOSIS — R278 Other lack of coordination: Secondary | ICD-10-CM | POA: Diagnosis not present

## 2016-09-11 DIAGNOSIS — R102 Pelvic and perineal pain: Secondary | ICD-10-CM | POA: Diagnosis not present

## 2016-09-17 DIAGNOSIS — R278 Other lack of coordination: Secondary | ICD-10-CM | POA: Diagnosis not present

## 2016-09-17 DIAGNOSIS — M62838 Other muscle spasm: Secondary | ICD-10-CM | POA: Diagnosis not present

## 2016-09-17 DIAGNOSIS — R102 Pelvic and perineal pain: Secondary | ICD-10-CM | POA: Diagnosis not present

## 2016-09-17 DIAGNOSIS — N3281 Overactive bladder: Secondary | ICD-10-CM | POA: Diagnosis not present

## 2016-09-17 DIAGNOSIS — M6281 Muscle weakness (generalized): Secondary | ICD-10-CM | POA: Diagnosis not present

## 2016-09-18 ENCOUNTER — Ambulatory Visit (INDEPENDENT_AMBULATORY_CARE_PROVIDER_SITE_OTHER): Payer: Medicare Other

## 2016-09-18 ENCOUNTER — Encounter: Payer: Self-pay | Admitting: Podiatry

## 2016-09-18 ENCOUNTER — Ambulatory Visit: Payer: Medicare Other | Admitting: Podiatry

## 2016-09-18 VITALS — BP 143/91 | HR 86

## 2016-09-18 DIAGNOSIS — R52 Pain, unspecified: Secondary | ICD-10-CM

## 2016-09-18 DIAGNOSIS — M79672 Pain in left foot: Secondary | ICD-10-CM | POA: Diagnosis not present

## 2016-09-18 DIAGNOSIS — L851 Acquired keratosis [keratoderma] palmaris et plantaris: Secondary | ICD-10-CM

## 2016-09-18 DIAGNOSIS — M79673 Pain in unspecified foot: Secondary | ICD-10-CM | POA: Diagnosis not present

## 2016-09-18 DIAGNOSIS — L84 Corns and callosities: Secondary | ICD-10-CM

## 2016-09-18 DIAGNOSIS — M79671 Pain in right foot: Secondary | ICD-10-CM | POA: Diagnosis not present

## 2016-09-20 DIAGNOSIS — J069 Acute upper respiratory infection, unspecified: Secondary | ICD-10-CM | POA: Diagnosis not present

## 2016-09-20 DIAGNOSIS — G4483 Primary cough headache: Secondary | ICD-10-CM | POA: Diagnosis not present

## 2016-09-20 DIAGNOSIS — B9789 Other viral agents as the cause of diseases classified elsewhere: Secondary | ICD-10-CM | POA: Diagnosis not present

## 2016-09-22 NOTE — Progress Notes (Signed)
Subjective: Patient presents to the office today for chief complaint of painful callus lesions of the feet. Patient states that the pain is ongoing and is affecting their ability to ambulate without pain. Patient presents today for further treatment and evaluation.  Objective:  Physical Exam General: Alert and oriented x3 in no acute distress  Dermatology: Hyperkeratotic lesion present on the plantar aspect of the fifth MPJ bilaterally as well as the plantar aspect of the bilateral forefeet. Pain on palpation with a central nucleated core noted.  Skin is warm, dry and supple bilateral lower extremities. Negative for open lesions or macerations.  Vascular: Palpable pedal pulses bilaterally. No edema or erythema noted. Capillary refill within normal limits.  Neurological: Epicritic and protective threshold grossly intact bilaterally.   Musculoskeletal Exam: Pain on palpation at the keratotic lesion noted. Range of motion within normal limits bilateral. Muscle strength 5/5 in all groups bilateral.  Assessment: #1 porokeratosis fifth MPJ right foot #2 bilateral calluses #3 pain in bilateral feet   Plan of Care:  #1 Patient evaluated #2 Excisional debridement of  keratoic lesion using a chisel blade was performed without incident.  #3 Treated area(s) with Salinocaine and dressed with light dressing. #4 Patient is to return to the clinic PRN.   Edrick Kins, [place]

## 2016-09-26 DIAGNOSIS — M62838 Other muscle spasm: Secondary | ICD-10-CM | POA: Diagnosis not present

## 2016-09-26 DIAGNOSIS — M6281 Muscle weakness (generalized): Secondary | ICD-10-CM | POA: Diagnosis not present

## 2016-09-26 DIAGNOSIS — R102 Pelvic and perineal pain: Secondary | ICD-10-CM | POA: Diagnosis not present

## 2016-09-26 DIAGNOSIS — R278 Other lack of coordination: Secondary | ICD-10-CM | POA: Diagnosis not present

## 2016-09-26 DIAGNOSIS — R350 Frequency of micturition: Secondary | ICD-10-CM | POA: Diagnosis not present

## 2016-10-01 ENCOUNTER — Ambulatory Visit: Payer: Medicare Other | Admitting: Podiatry

## 2016-10-09 DIAGNOSIS — N3281 Overactive bladder: Secondary | ICD-10-CM | POA: Diagnosis not present

## 2016-10-09 DIAGNOSIS — M62838 Other muscle spasm: Secondary | ICD-10-CM | POA: Diagnosis not present

## 2016-10-09 DIAGNOSIS — R102 Pelvic and perineal pain: Secondary | ICD-10-CM | POA: Diagnosis not present

## 2016-10-09 DIAGNOSIS — R278 Other lack of coordination: Secondary | ICD-10-CM | POA: Diagnosis not present

## 2016-10-09 DIAGNOSIS — M6281 Muscle weakness (generalized): Secondary | ICD-10-CM | POA: Diagnosis not present

## 2016-10-18 DIAGNOSIS — N3281 Overactive bladder: Secondary | ICD-10-CM | POA: Diagnosis not present

## 2016-10-18 DIAGNOSIS — M6281 Muscle weakness (generalized): Secondary | ICD-10-CM | POA: Diagnosis not present

## 2016-10-18 DIAGNOSIS — R278 Other lack of coordination: Secondary | ICD-10-CM | POA: Diagnosis not present

## 2016-10-18 DIAGNOSIS — R102 Pelvic and perineal pain: Secondary | ICD-10-CM | POA: Diagnosis not present

## 2016-10-18 DIAGNOSIS — M62838 Other muscle spasm: Secondary | ICD-10-CM | POA: Diagnosis not present

## 2016-10-28 DIAGNOSIS — G4489 Other headache syndrome: Secondary | ICD-10-CM | POA: Diagnosis not present

## 2016-10-28 DIAGNOSIS — Z79899 Other long term (current) drug therapy: Secondary | ICD-10-CM | POA: Diagnosis not present

## 2016-10-28 DIAGNOSIS — M791 Myalgia: Secondary | ICD-10-CM | POA: Diagnosis not present

## 2016-10-28 DIAGNOSIS — M542 Cervicalgia: Secondary | ICD-10-CM | POA: Diagnosis not present

## 2016-10-28 DIAGNOSIS — M47812 Spondylosis without myelopathy or radiculopathy, cervical region: Secondary | ICD-10-CM | POA: Diagnosis not present

## 2016-10-29 DIAGNOSIS — I10 Essential (primary) hypertension: Secondary | ICD-10-CM | POA: Diagnosis not present

## 2016-10-29 DIAGNOSIS — I779 Disorder of arteries and arterioles, unspecified: Secondary | ICD-10-CM | POA: Diagnosis not present

## 2016-10-29 DIAGNOSIS — C50911 Malignant neoplasm of unspecified site of right female breast: Secondary | ICD-10-CM | POA: Diagnosis not present

## 2016-10-29 DIAGNOSIS — F419 Anxiety disorder, unspecified: Secondary | ICD-10-CM | POA: Diagnosis not present

## 2016-10-29 DIAGNOSIS — E785 Hyperlipidemia, unspecified: Secondary | ICD-10-CM | POA: Diagnosis not present

## 2016-10-30 ENCOUNTER — Telehealth: Payer: Self-pay | Admitting: *Deleted

## 2016-10-30 DIAGNOSIS — I10 Essential (primary) hypertension: Secondary | ICD-10-CM | POA: Diagnosis not present

## 2016-10-30 DIAGNOSIS — H401131 Primary open-angle glaucoma, bilateral, mild stage: Secondary | ICD-10-CM | POA: Diagnosis not present

## 2016-10-30 NOTE — Telephone Encounter (Signed)
Call from pt to follow up on bone density and mammogram appointments. She reports she has been calling for 3 weeks to get this scheduled. Noted imaging is ordered- due in February, informed her that schedulers likely haven't gotten to it yet.  Message sent to managed care to be sure no prior auth is required. Told pt to call mid to late January if she has not heard back by then. She voiced understanding.

## 2016-11-01 ENCOUNTER — Other Ambulatory Visit: Payer: Self-pay | Admitting: Internal Medicine

## 2016-11-01 DIAGNOSIS — R0989 Other specified symptoms and signs involving the circulatory and respiratory systems: Secondary | ICD-10-CM

## 2016-11-06 IMAGING — MR MR CERVICAL SPINE W/O CM
4 of 5 series · 19 of 48 positions shown · non-contrast
Comparison: None.

CLINICAL DATA: Initial evaluation for acute numbness in arms and
legs.

EXAM:
MRI CERVICAL SPINE WITHOUT CONTRAST
TECHNIQUE: Multiplanar, multisequence MR imaging of the cervical spine was
performed. No intravenous contrast was administered.

[Series 2: T2 · sagittal · 3.0mm · 0.39mm/px · 6 of 15 slices shown (1 of 2)]
[im 1/15]
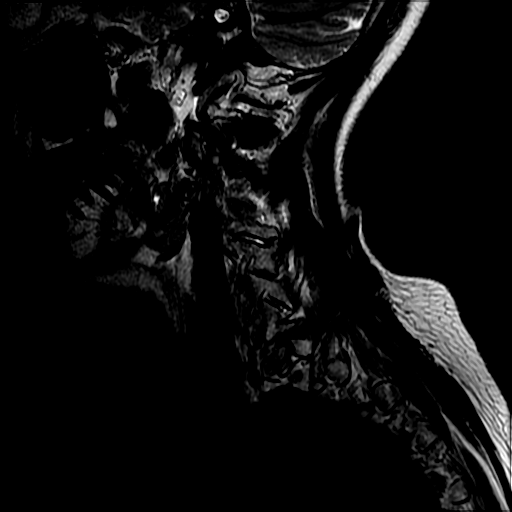
[im 3/15]
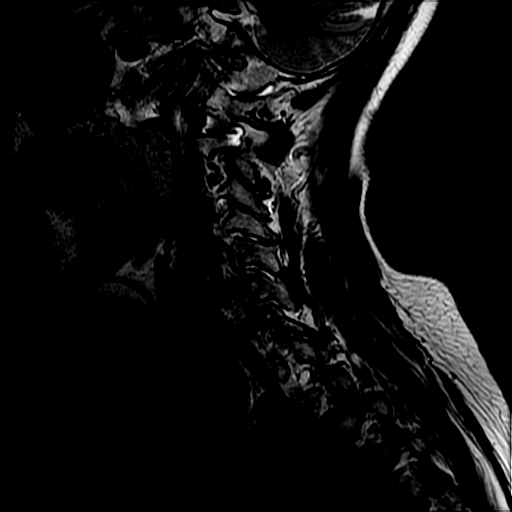
[im 6/15]
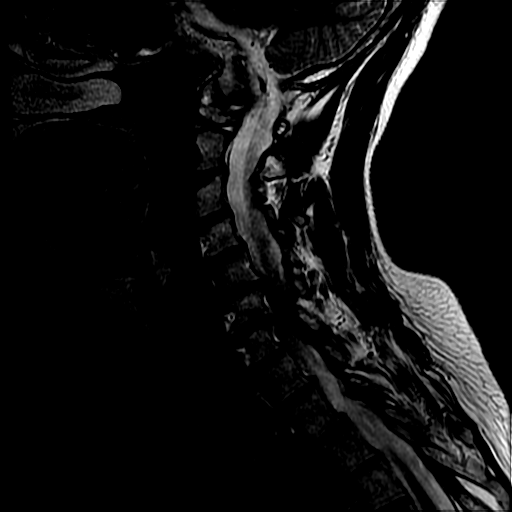
[im 9/15]
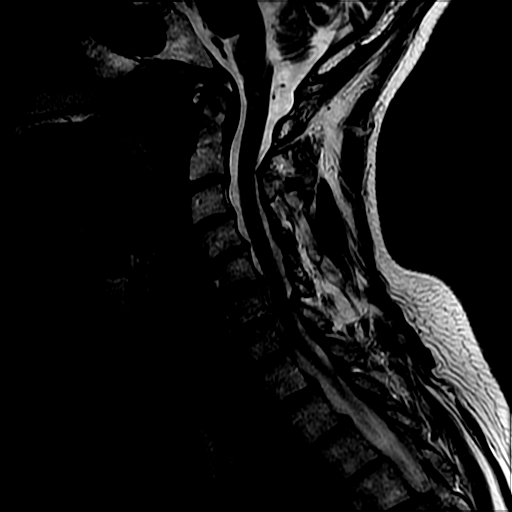
[im 12/15]
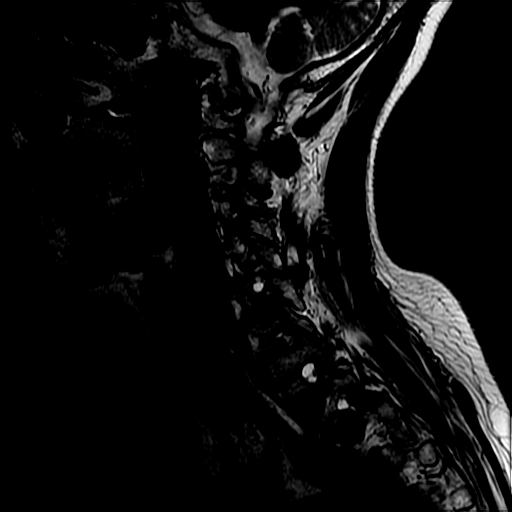
[im 15/15]
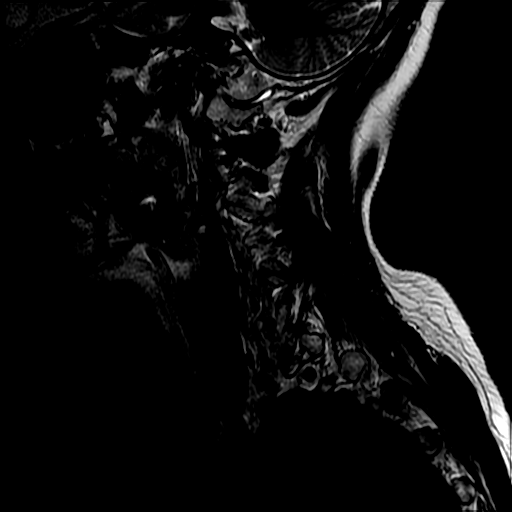

[Series 3: T1 · sagittal · 3.0mm · 0.39mm/px · 3 of 15 slices shown]
[im 3/15]
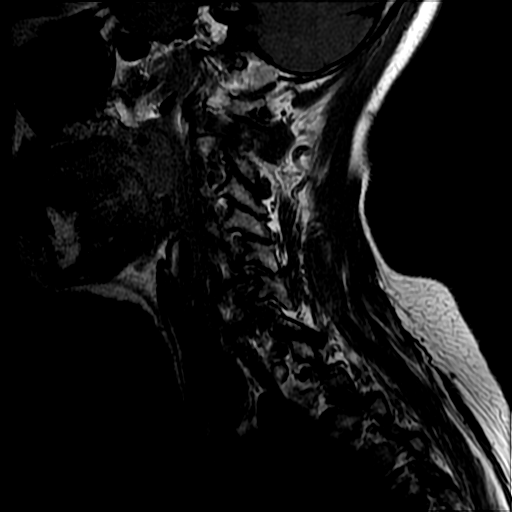
[im 9/15]
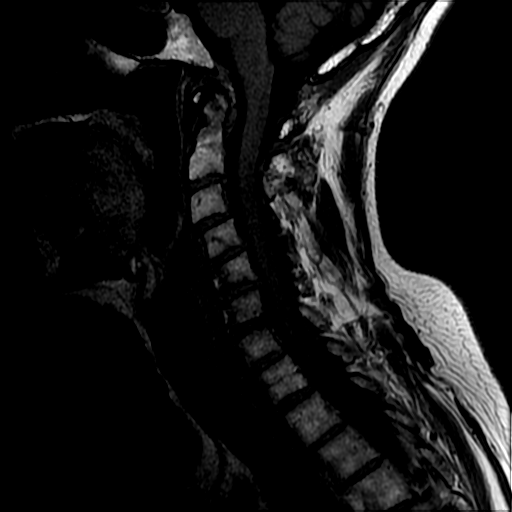
[im 15/15]
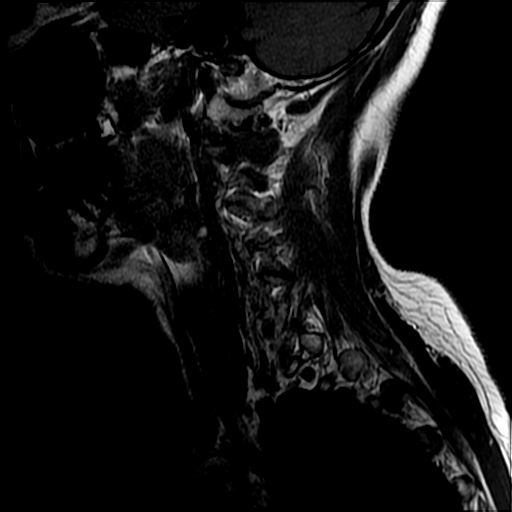

[Series 4: sag ir · sagittal · 3.0mm · 0.78mm/px · 3 of 15 slices shown]
[im 3/15]
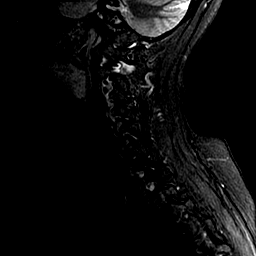
[im 9/15]
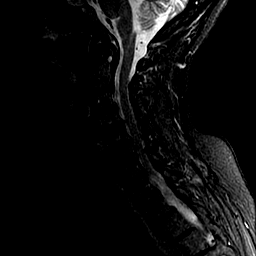
[im 15/15]
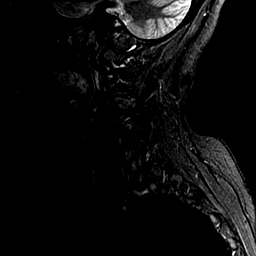

[Series 6: T2 · axial · 3.0mm · 0.39mm/px · z∈[-82,+13]mm · 7 of 36 slices shown (2 of 2)]
[im 1/36]
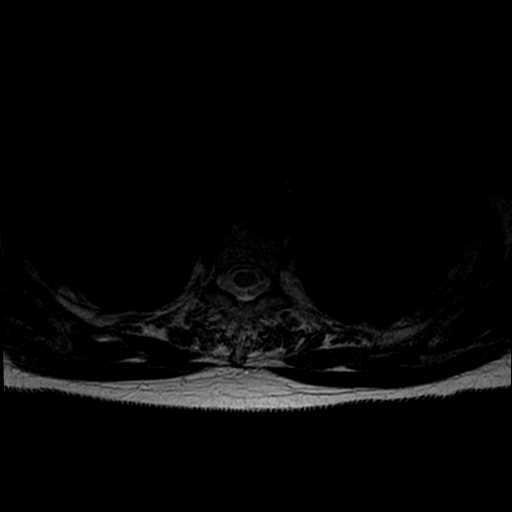
[im 6/36]
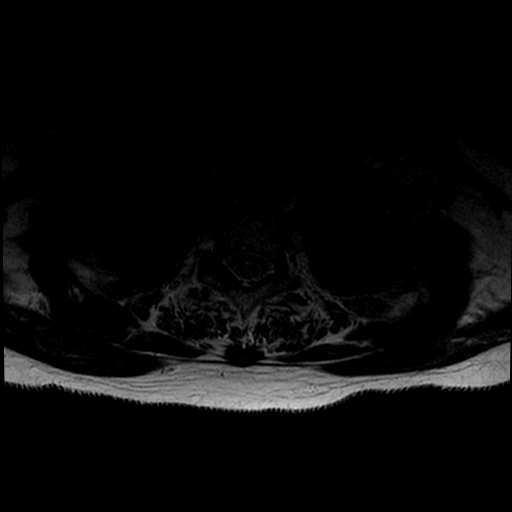
[im 11/36]
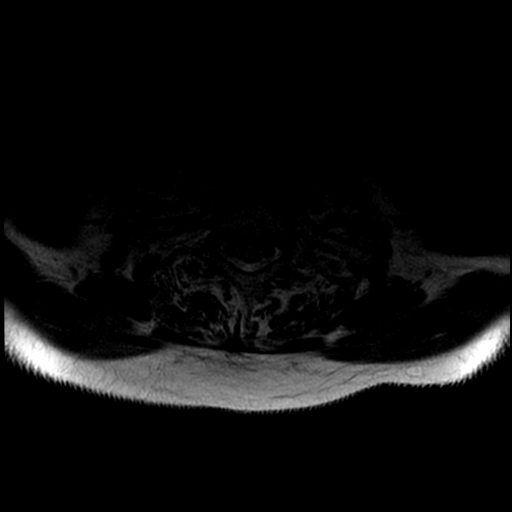
[im 16/36]
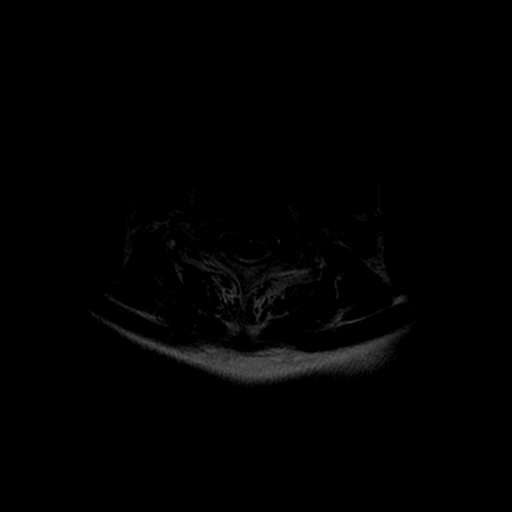
[im 18/36]
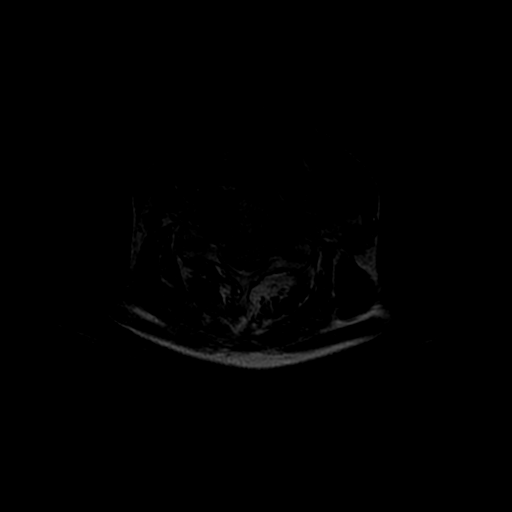
[im 21/36]
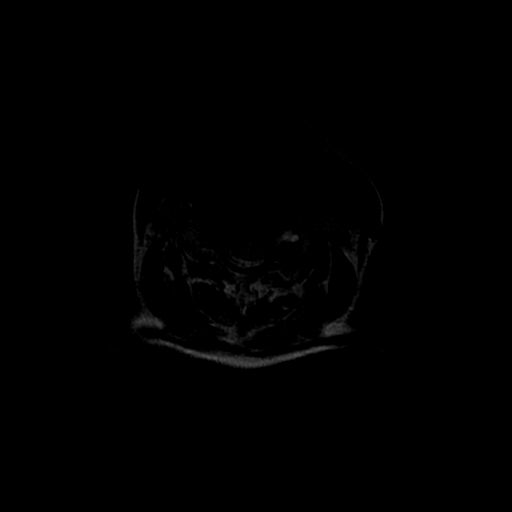
[im 31/36]
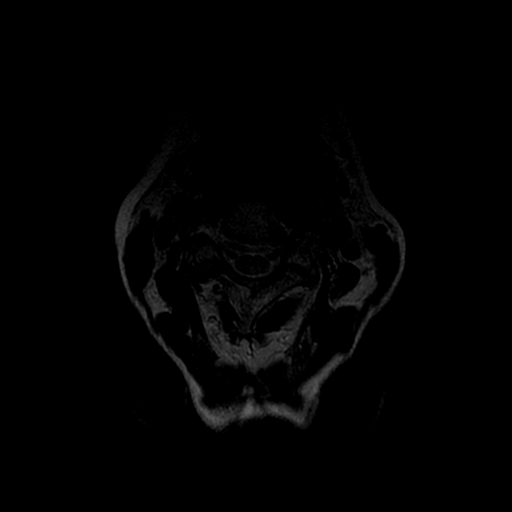

[19 of 48 positions shown; findings below may reference images not displayed]

FINDINGS: Visualized portions of the brain and posterior fossa are
unremarkable. Craniocervical junction widely patent.

Mild reversal of the normal cervical lordosis with apex at C5-6.
Trace anterolisthesis of C3 on C4 and C4 on C5. Trace
anterolisthesis of C7 on T1 as well. Vertebral body heights
preserved. No acute fracture or malalignment. Signal intensity
within the vertebral body bone marrow is normal. No focal osseous
lesions. No marrow edema.

Signal intensity within the cervical spinal cord is normal.

Paraspinous soft tissues demonstrate no acute abnormality. No
prevertebral edema. Normal intravascular flow voids present within
the vertebral arteries bilaterally.

C2-C3: Shallow broad-based posterior disc protrusion without
significant canal stenosis. Mild bilateral uncovertebral spurring.
Foramina are widely patent.

C3-C4: Trace anterolisthesis of C3 on C4. Predominantly left-sided
uncovertebral hypertrophy and facet arthrosis. Resultant mild left
foraminal narrowing. No significant canal stenosis.

C4-C5: Trace anterolisthesis of C4 on C5. Shallow broad-based
posterior disc protrusion flattens the ventral thecal sac without
significant stenosis. Superimposed bilateral uncovertebral
hypertrophy, slightly greater on the right. Fairly exuberant
bilateral facet arthrosis, right worse than left. Mild to moderate
bilateral foraminal narrowing.

C5-C6: Degenerative intervertebral disc space narrowing with mild
endplate changes and anterior endplate osteophytic spurring.
Bilateral uncovertebral spurring, right worse than left. Shallow
broad-based posterior disc osteophyte complex flattens the ventral
thecal sac and results in mild canal stenosis. Severe bilateral
foraminal narrowing, right worse than left.

C6-C7: Degenerative intervertebral disc space narrowing with
endplate changes and anterior endplate osteophytic spurring. Shallow
broad-based posterior disc osteophyte complex flattens and effaces
the ventral thecal sac. Superimposed mild ligamentum flavum
thickening. Resultant mild to moderate canal stenosis. Severe
bilateral foraminal stenosis present.

C7-T1: Trace anterolisthesis of C7 on T1. Mild annular disc bulge.
Posterior element hypertrophy. No significant stenosis.

Shallow posterior disc protrusion present at T1-2, mildly indenting
the ventral thecal sac without significant stenosis. Central disc
protrusion noted at T2-3, indenting the ventral thecal sac without
significant stenosis. Remainder of the visualized upper thoracic
spine within normal limits.
IMPRESSION: 1. No acute abnormality identified within the cervical spine.
2. Multifactorial degenerative spondylolysis at C5-6 and C6-7 with
resultant mild to moderate canal and severe bilateral foraminal
stenosis.
3. Additional more mild multilevel degenerative spondylolysis as
detailed above. Please see above report for a full description of
these findings.
4. Reversal of the normal cervical lordosis.

## 2016-11-07 ENCOUNTER — Ambulatory Visit
Admission: RE | Admit: 2016-11-07 | Discharge: 2016-11-07 | Disposition: A | Discharge status: Home / Self Care | Payer: Medicare Other | Source: Ambulatory Visit | Attending: Internal Medicine | Admitting: Internal Medicine

## 2016-11-07 DIAGNOSIS — R0989 Other specified symptoms and signs involving the circulatory and respiratory systems: Secondary | ICD-10-CM

## 2016-11-07 DIAGNOSIS — I6523 Occlusion and stenosis of bilateral carotid arteries: Secondary | ICD-10-CM | POA: Diagnosis not present

## 2016-11-15 DIAGNOSIS — R030 Elevated blood-pressure reading, without diagnosis of hypertension: Secondary | ICD-10-CM | POA: Diagnosis not present

## 2016-11-15 DIAGNOSIS — E785 Hyperlipidemia, unspecified: Secondary | ICD-10-CM | POA: Diagnosis not present

## 2016-11-15 DIAGNOSIS — R202 Paresthesia of skin: Secondary | ICD-10-CM | POA: Diagnosis not present

## 2016-11-15 DIAGNOSIS — I779 Disorder of arteries and arterioles, unspecified: Secondary | ICD-10-CM | POA: Diagnosis not present

## 2016-11-15 DIAGNOSIS — F419 Anxiety disorder, unspecified: Secondary | ICD-10-CM | POA: Diagnosis not present

## 2016-11-18 DIAGNOSIS — R102 Pelvic and perineal pain: Secondary | ICD-10-CM | POA: Diagnosis not present

## 2016-11-18 DIAGNOSIS — M62838 Other muscle spasm: Secondary | ICD-10-CM | POA: Diagnosis not present

## 2016-11-18 DIAGNOSIS — N3281 Overactive bladder: Secondary | ICD-10-CM | POA: Diagnosis not present

## 2016-11-18 DIAGNOSIS — M6281 Muscle weakness (generalized): Secondary | ICD-10-CM | POA: Diagnosis not present

## 2016-11-18 DIAGNOSIS — R278 Other lack of coordination: Secondary | ICD-10-CM | POA: Diagnosis not present

## 2016-11-19 ENCOUNTER — Other Ambulatory Visit: Payer: Self-pay | Admitting: *Deleted

## 2016-11-19 ENCOUNTER — Telehealth: Payer: Self-pay

## 2016-11-19 DIAGNOSIS — I6522 Occlusion and stenosis of left carotid artery: Secondary | ICD-10-CM

## 2016-11-19 NOTE — Telephone Encounter (Signed)
Nurse from [facility] called to confirm that we have scheduled pt an appt.

## 2016-12-02 DIAGNOSIS — R102 Pelvic and perineal pain: Secondary | ICD-10-CM | POA: Diagnosis not present

## 2016-12-02 DIAGNOSIS — M6281 Muscle weakness (generalized): Secondary | ICD-10-CM | POA: Diagnosis not present

## 2016-12-02 DIAGNOSIS — M62838 Other muscle spasm: Secondary | ICD-10-CM | POA: Diagnosis not present

## 2016-12-02 DIAGNOSIS — N3281 Overactive bladder: Secondary | ICD-10-CM | POA: Diagnosis not present

## 2016-12-03 ENCOUNTER — Encounter: Payer: Self-pay | Admitting: Vascular Surgery

## 2016-12-04 ENCOUNTER — Other Ambulatory Visit: Payer: Self-pay | Admitting: Oncology

## 2016-12-04 DIAGNOSIS — C50911 Malignant neoplasm of unspecified site of right female breast: Secondary | ICD-10-CM

## 2016-12-10 ENCOUNTER — Ambulatory Visit (INDEPENDENT_AMBULATORY_CARE_PROVIDER_SITE_OTHER): Payer: Medicare Other | Admitting: Vascular Surgery

## 2016-12-10 ENCOUNTER — Ambulatory Visit (HOSPITAL_COMMUNITY)
Admission: RE | Admit: 2016-12-10 | Discharge: 2016-12-10 | Disposition: A | Discharge status: Home / Self Care | Payer: Medicare Other | Source: Ambulatory Visit | Attending: Vascular Surgery | Admitting: Vascular Surgery

## 2016-12-10 ENCOUNTER — Encounter: Payer: Self-pay | Admitting: Vascular Surgery

## 2016-12-10 VITALS — BP 141/78 | HR 85 | Temp 98.1°F | Resp 16 | Ht 65.0 in | Wt 130.0 lb

## 2016-12-10 DIAGNOSIS — I6522 Occlusion and stenosis of left carotid artery: Secondary | ICD-10-CM

## 2016-12-10 LAB — VAS US CAROTID
LEFT ECA DIAS: -11 cm/s
Left CCA dist dias: -22 cm/s
Left CCA dist sys: -68 cm/s
Left CCA prox dias: 23 cm/s
Left CCA prox sys: 80 cm/s
Left ICA dist dias: -33 cm/s
Left ICA dist sys: -103 cm/s
Left ICA prox dias: -46 cm/s
Left ICA prox sys: -142 cm/s

## 2016-12-10 NOTE — Progress Notes (Signed)
[facility] [place]  Patient name: Barbara Rangel MRN: [id number] DOB: 1946-06-09 Sex: female  REASON FOR CONSULT: Discussion of left carotid stenosis  HPI: Barbara Rangel is a 71 y.o. female, who is here today for discussion of recent outpatient carotid duplex showing potentially severe stenosis of her left internal carotid artery. She reports that she had had an evaluation several years ago with ultrasound showing moderate stenosis. Recent follow-up study showed of potentially high-grade left carotid stenosis. She is right-handed. She does have a history of a prior neurologic deficit associated a dental procedure. Became somewhat unresponsive and had the hospitalization requiring several days for orientation. Workup at that time showed no evidence of stroke. Does have a history of breast cancer. No history of stroke, aphasia or transient ischemic attack. Denies any focal neurologic deficits. She is here today with her husband.  Past Medical History:  Diagnosis Date  . Arthritis    degenerative arthritis  . Cancer ([place])    hx breast cancer-right  . Chronic back pain    right radicular leg pain  . Chronic urinary tract infection    takes Macrodantin and Cranberry daily  . Constipation    r/t pain meds and takes Dulcolax nightly  . FHx: colonic polyps    hx of and mother had colon cancer  . Glaucoma    uses Xalantan nightly  . Hypertension    takes Amlodipine daily  . Other and unspecified general anesthetics causing adverse effect in therapeutic use    hard to wake up  . PONV (postoperative nausea and vomiting)     Family History  Problem Relation Age of Onset  . Anesthesia problems Neg Hx   . Hypotension Neg Hx   . Malignant hyperthermia Neg Hx   . Pseudochol deficiency Neg Hx   . Colon cancer Mother     SOCIAL HISTORY: Social History   Social History  . Marital status: Married    Spouse name: Doren Custard    . Number of children: 4  . Years of education: BS   Occupational History  . Retired    Social History Main Topics  . Smoking status: Never Smoker  . Smokeless tobacco: Never Used  . Alcohol use No  . Drug use: No  . Sexual activity: Yes   Other Topics Concern  . Not on file   Social History Narrative   Pt lives at home with spouse.   Caffeine Use: none    Allergies  Allergen Reactions  . Nitrous Oxide Other (See Comments)    Pt passed out and had confusion. Took a few hours to wake her up.  . Avelox [Moxifloxacin Hcl In Nacl] Other (See Comments)    Severe headache   . Cephalexin Other (See Comments)    Caused headache  . Other Other (See Comments)    Anesthesia, needs antiemetic med  . Quinolones Other (See Comments)    Headaches?  . Tramadol Nausea Only and Other (See Comments)    Nausea and Dizziness, too.  . Trimethoprim Other (See Comments)    Rapid heartbeat.  . Citalopram Hydrobromide Other (See Comments)    Pt can't remember what the side effect was.    Current Outpatient Prescriptions  Medication Sig Dispense Refill  . acetaminophen (TYLENOL) 500 MG tablet Take 1,000 mg by mouth at bedtime.    Marland Kitchen alendronate (FOSAMAX) 70 MG tablet Take 1 tablet (70 mg total) by mouth every 7 (seven) days. Hold while  in hospital. Thursday.  Take with a full glass of water on an empty stomach. (Patient taking differently: Take 70 mg by mouth every Saturday. Take with a full glass of water on an empty stomach.) 4 tablet 2  . amLODipine (NORVASC) 5 MG tablet Take 5 mg by mouth at bedtime.     Marland Kitchen anastrozole (ARIMIDEX) 1 MG tablet TAKE 1 TABLET BY MOUTH  DAILY 90 tablet 0  . aspirin EC 81 MG tablet Take 81 mg by mouth every morning.     Marland Kitchen atorvastatin (LIPITOR) 20 MG tablet Take 1 tablet by mouth every morning.    . calcium citrate-vitamin D (CITRACAL+D) 315-200 MG-UNIT per tablet Take 2 tablets by mouth 2 (two) times daily.     . cetirizine (ZYRTEC) 10 MG tablet Take 10 mg by  mouth daily.    . Cranberry Extract 250 MG TABS Take 1 capsule by mouth at bedtime.     . docusate sodium (COLACE) 100 MG capsule Take 100 mg by mouth 2 (two) times daily.      . fluticasone (FLONASE) 50 MCG/ACT nasal spray Place 1 spray into both nostrils daily.    Marland Kitchen latanoprost (XALATAN) 0.005 % ophthalmic solution Place 1 drop into both eyes at bedtime.      Marland Kitchen LORazepam (ATIVAN) 0.5 MG tablet Take 0.5-1 mg by mouth 3 (three) times daily as needed. Reported on 03/14/2016  0  . Magnesium 250 MG TABS Take 1 tablet by mouth daily.    . Multiple Vitamins-Minerals (MULTIVITAMINS THER. W/MINERALS) TABS Take 1 tablet by mouth every morning.     Marland Kitchen omeprazole (PRILOSEC) 40 MG capsule Take 40 mg by mouth every morning.      No current facility-administered medications for this visit.     REVIEW OF SYSTEMS:  [X]  denotes positive finding, [ ]  denotes negative finding Cardiac  Comments:  Chest pain or chest pressure:    Shortness of breath upon exertion:    Short of breath when lying flat:    Irregular heart rhythm:        Vascular    Pain in calf, thigh, or hip brought on by ambulation:    Pain in feet at night that wakes you up from your sleep:     Blood clot in your veins:    Leg swelling:  x       Pulmonary    Oxygen at home:    Productive cough:     Wheezing:         Neurologic    Sudden weakness in arms or legs:     Sudden numbness in arms or legs:     Sudden onset of difficulty speaking or slurred speech:    Temporary loss of vision in one eye:     Problems with dizziness:         Gastrointestinal    Blood in stool:     Vomited blood:         Genitourinary    Burning when urinating:     Blood in urine:        Psychiatric    Major depression:         Hematologic    Bleeding problems:    Problems with blood clotting too easily:        Skin    Rashes or ulcers: x       Constitutional    Fever or chills:      PHYSICAL EXAM: Vitals:   12/10/16 1326  BP: (!) 141/78   Pulse: 85  Resp: 16  Temp: 98.1 F (36.7 C)  TempSrc: Oral  SpO2: 98%  Weight: 130 lb (59 kg)  Height: 5\' 5"  (1.651 m)    GENERAL: The patient is a well-nourished female, in no acute distress. The vital signs are documented above. CARDIOVASCULAR: Carotid arteries are without bruits bilaterally. 2+ radial, 2+ femoral and 2+ dorsalis pedis pulses bilaterally PULMONARY: There is good air exchange  ABDOMEN: Soft and non-tender  MUSCULOSKELETAL: There are no major deformities or cyanosis. NEUROLOGIC: No focal weakness or paresthesias are detected. SKIN: There are no ulcers or rashes noted. PSYCHIATRIC: The patient has a normal affect.  DATA:  I reviewed her carotid duplex from [facility] imaging suggesting a 70-99% left carotid stenosis. This was on 11/07/2016. Her left systolic velocity was 123456 cm/s and end-diastolic was 75 cm/s. She had moderate stenosis on the right carotid  We repeated her left carotid study today and got dramatically lower velocities in the study in December. Her study maximal systolic velocities were 0000000 cm/s and end-diastolic was 53 cm/s  MEDICAL ISSUES: Had long discussion with the patient and her husband present. I explained that there was a significant difference between these 2 studies. I explained that with no symptoms related to her left carotid that even if the [facility] imaging study was correct, we would recommend 6 month repeat duplex follow-up. I did review symptoms of carotid disease with her and she knows to notify Chriss Driver should this occur. Otherwise we will see her in 6 months with repeat bilateral carotid duplex   Rosetta Posner, MD [facility] Tel [phone] Pager [phone]

## 2016-12-11 ENCOUNTER — Encounter: Payer: Self-pay | Admitting: Internal Medicine

## 2016-12-13 ENCOUNTER — Other Ambulatory Visit: Payer: Self-pay | Admitting: Hematology and Oncology

## 2016-12-13 ENCOUNTER — Other Ambulatory Visit: Payer: Self-pay | Admitting: Oncology

## 2016-12-13 DIAGNOSIS — Z1231 Encounter for screening mammogram for malignant neoplasm of breast: Secondary | ICD-10-CM

## 2016-12-13 DIAGNOSIS — E2839 Other primary ovarian failure: Secondary | ICD-10-CM

## 2016-12-13 DIAGNOSIS — Z79899 Other long term (current) drug therapy: Secondary | ICD-10-CM

## 2016-12-13 NOTE — Addendum Note (Signed)
Addended by: Lianne Cure A on: 12/13/2016 04:27 PM   Modules accepted: Orders

## 2016-12-23 DIAGNOSIS — R102 Pelvic and perineal pain: Secondary | ICD-10-CM | POA: Diagnosis not present

## 2016-12-23 DIAGNOSIS — N3281 Overactive bladder: Secondary | ICD-10-CM | POA: Diagnosis not present

## 2016-12-23 DIAGNOSIS — M62838 Other muscle spasm: Secondary | ICD-10-CM | POA: Diagnosis not present

## 2016-12-23 DIAGNOSIS — M6281 Muscle weakness (generalized): Secondary | ICD-10-CM | POA: Diagnosis not present

## 2016-12-31 DIAGNOSIS — R102 Pelvic and perineal pain: Secondary | ICD-10-CM | POA: Diagnosis not present

## 2016-12-31 DIAGNOSIS — M6281 Muscle weakness (generalized): Secondary | ICD-10-CM | POA: Diagnosis not present

## 2016-12-31 DIAGNOSIS — N3281 Overactive bladder: Secondary | ICD-10-CM | POA: Diagnosis not present

## 2016-12-31 DIAGNOSIS — M62838 Other muscle spasm: Secondary | ICD-10-CM | POA: Diagnosis not present

## 2017-01-10 ENCOUNTER — Ambulatory Visit
Admission: RE | Admit: 2017-01-10 | Discharge: 2017-01-10 | Disposition: A | Discharge status: Home / Self Care | Payer: Medicare Other | Source: Ambulatory Visit | Attending: Hematology and Oncology | Admitting: Hematology and Oncology

## 2017-01-10 DIAGNOSIS — Z780 Asymptomatic menopausal state: Secondary | ICD-10-CM | POA: Diagnosis not present

## 2017-01-10 DIAGNOSIS — E2839 Other primary ovarian failure: Secondary | ICD-10-CM

## 2017-01-10 DIAGNOSIS — Z1231 Encounter for screening mammogram for malignant neoplasm of breast: Secondary | ICD-10-CM | POA: Diagnosis not present

## 2017-01-10 DIAGNOSIS — Z79899 Other long term (current) drug therapy: Secondary | ICD-10-CM

## 2017-01-10 DIAGNOSIS — M85851 Other specified disorders of bone density and structure, right thigh: Secondary | ICD-10-CM | POA: Diagnosis not present

## 2017-01-13 DIAGNOSIS — R102 Pelvic and perineal pain: Secondary | ICD-10-CM | POA: Diagnosis not present

## 2017-01-13 DIAGNOSIS — M62838 Other muscle spasm: Secondary | ICD-10-CM | POA: Diagnosis not present

## 2017-01-13 DIAGNOSIS — R350 Frequency of micturition: Secondary | ICD-10-CM | POA: Diagnosis not present

## 2017-01-13 DIAGNOSIS — M6281 Muscle weakness (generalized): Secondary | ICD-10-CM | POA: Diagnosis not present

## 2017-01-14 ENCOUNTER — Other Ambulatory Visit: Payer: Self-pay | Admitting: *Deleted

## 2017-01-14 DIAGNOSIS — C50911 Malignant neoplasm of unspecified site of right female breast: Secondary | ICD-10-CM

## 2017-01-14 MED ORDER — ANASTROZOLE 1 MG PO TABS: 1.0000 mg | ORAL_TABLET | Freq: Every day | ORAL | 3 refills | Status: DC

## 2017-01-24 DIAGNOSIS — R102 Pelvic and perineal pain: Secondary | ICD-10-CM | POA: Diagnosis not present

## 2017-01-24 DIAGNOSIS — M6281 Muscle weakness (generalized): Secondary | ICD-10-CM | POA: Diagnosis not present

## 2017-01-24 DIAGNOSIS — N3281 Overactive bladder: Secondary | ICD-10-CM | POA: Diagnosis not present

## 2017-01-24 DIAGNOSIS — M62838 Other muscle spasm: Secondary | ICD-10-CM | POA: Diagnosis not present

## 2017-01-31 DIAGNOSIS — H401131 Primary open-angle glaucoma, bilateral, mild stage: Secondary | ICD-10-CM | POA: Diagnosis not present

## 2017-02-04 DIAGNOSIS — R202 Paresthesia of skin: Secondary | ICD-10-CM | POA: Diagnosis not present

## 2017-02-04 DIAGNOSIS — Z1389 Encounter for screening for other disorder: Secondary | ICD-10-CM | POA: Diagnosis not present

## 2017-02-04 DIAGNOSIS — Z7189 Other specified counseling: Secondary | ICD-10-CM | POA: Diagnosis not present

## 2017-02-04 DIAGNOSIS — M150 Primary generalized (osteo)arthritis: Secondary | ICD-10-CM | POA: Diagnosis not present

## 2017-02-04 DIAGNOSIS — Z0000 Encounter for general adult medical examination without abnormal findings: Secondary | ICD-10-CM | POA: Diagnosis not present

## 2017-02-04 DIAGNOSIS — C50911 Malignant neoplasm of unspecified site of right female breast: Secondary | ICD-10-CM | POA: Diagnosis not present

## 2017-02-04 DIAGNOSIS — I779 Disorder of arteries and arterioles, unspecified: Secondary | ICD-10-CM | POA: Diagnosis not present

## 2017-02-04 DIAGNOSIS — E785 Hyperlipidemia, unspecified: Secondary | ICD-10-CM | POA: Diagnosis not present

## 2017-02-04 DIAGNOSIS — I10 Essential (primary) hypertension: Secondary | ICD-10-CM | POA: Diagnosis not present

## 2017-02-04 DIAGNOSIS — F419 Anxiety disorder, unspecified: Secondary | ICD-10-CM | POA: Diagnosis not present

## 2017-02-06 DIAGNOSIS — N3281 Overactive bladder: Secondary | ICD-10-CM | POA: Diagnosis not present

## 2017-02-06 DIAGNOSIS — R102 Pelvic and perineal pain: Secondary | ICD-10-CM | POA: Diagnosis not present

## 2017-02-06 DIAGNOSIS — M6281 Muscle weakness (generalized): Secondary | ICD-10-CM | POA: Diagnosis not present

## 2017-02-06 DIAGNOSIS — M62838 Other muscle spasm: Secondary | ICD-10-CM | POA: Diagnosis not present

## 2017-02-11 DIAGNOSIS — J301 Allergic rhinitis due to pollen: Secondary | ICD-10-CM | POA: Diagnosis not present

## 2017-02-11 DIAGNOSIS — J0100 Acute maxillary sinusitis, unspecified: Secondary | ICD-10-CM | POA: Diagnosis not present

## 2017-02-20 DIAGNOSIS — J301 Allergic rhinitis due to pollen: Secondary | ICD-10-CM | POA: Diagnosis not present

## 2017-02-20 DIAGNOSIS — J019 Acute sinusitis, unspecified: Secondary | ICD-10-CM | POA: Diagnosis not present

## 2017-02-21 ENCOUNTER — Other Ambulatory Visit: Payer: Self-pay | Admitting: *Deleted

## 2017-02-21 DIAGNOSIS — C50911 Malignant neoplasm of unspecified site of right female breast: Secondary | ICD-10-CM

## 2017-02-24 ENCOUNTER — Encounter: Payer: Self-pay | Admitting: Hematology and Oncology

## 2017-02-24 ENCOUNTER — Ambulatory Visit (HOSPITAL_BASED_OUTPATIENT_CLINIC_OR_DEPARTMENT_OTHER): Payer: Medicare Other | Admitting: Hematology and Oncology

## 2017-02-24 ENCOUNTER — Other Ambulatory Visit (HOSPITAL_BASED_OUTPATIENT_CLINIC_OR_DEPARTMENT_OTHER): Payer: Medicare Other

## 2017-02-24 DIAGNOSIS — I6522 Occlusion and stenosis of left carotid artery: Secondary | ICD-10-CM

## 2017-02-24 DIAGNOSIS — M8580 Other specified disorders of bone density and structure, unspecified site: Secondary | ICD-10-CM | POA: Diagnosis not present

## 2017-02-24 DIAGNOSIS — M859 Disorder of bone density and structure, unspecified: Secondary | ICD-10-CM

## 2017-02-24 DIAGNOSIS — M6281 Muscle weakness (generalized): Secondary | ICD-10-CM | POA: Diagnosis not present

## 2017-02-24 DIAGNOSIS — M62838 Other muscle spasm: Secondary | ICD-10-CM | POA: Diagnosis not present

## 2017-02-24 DIAGNOSIS — R102 Pelvic and perineal pain: Secondary | ICD-10-CM | POA: Diagnosis not present

## 2017-02-24 DIAGNOSIS — Z853 Personal history of malignant neoplasm of breast: Secondary | ICD-10-CM

## 2017-02-24 DIAGNOSIS — C50911 Malignant neoplasm of unspecified site of right female breast: Secondary | ICD-10-CM

## 2017-02-24 DIAGNOSIS — N3281 Overactive bladder: Secondary | ICD-10-CM | POA: Diagnosis not present

## 2017-02-24 DIAGNOSIS — Z170 Estrogen receptor positive status [ER+]: Principal | ICD-10-CM

## 2017-02-24 DIAGNOSIS — C50511 Malignant neoplasm of lower-outer quadrant of right female breast: Secondary | ICD-10-CM

## 2017-02-24 LAB — COMPREHENSIVE METABOLIC PANEL
ALT: 26 U/L (ref 0–55)
AST: 27 U/L (ref 5–34)
Albumin: 4 g/dL (ref 3.5–5.0)
Alkaline Phosphatase: 60 U/L (ref 40–150)
Anion Gap: 9 mEq/L (ref 3–11)
BUN: 14.4 mg/dL (ref 7.0–26.0)
CO2: 28 mEq/L (ref 22–29)
Calcium: 10.4 mg/dL (ref 8.4–10.4)
Chloride: 106 mEq/L (ref 98–109)
Creatinine: 0.8 mg/dL (ref 0.6–1.1)
EGFR: >90 mL/min/{1.73_m2} (ref >90)
Glucose: 82 mg/dl (ref 70–140)
Potassium: 4 mEq/L (ref 3.5–5.1)
Sodium: 143 mEq/L (ref 136–145)
Total Bilirubin: 0.6 mg/dL (ref 0.20–1.20)
Total Protein: 7 g/dL (ref 6.4–8.3)

## 2017-02-24 LAB — CBC WITH DIFFERENTIAL/PLATELET
BASO%: 0.4 % (ref 0.0–2.0)
Basophils Absolute: 0 10*3/uL (ref 0.0–0.1)
EOS%: 1.3 % (ref 0.0–7.0)
Eosinophils Absolute: 0.1 10*3/uL (ref 0.0–0.5)
HCT: 42.2 % (ref 34.8–46.6)
HGB: 14.3 g/dL (ref 11.6–15.9)
LYMPH%: 33.5 % (ref 14.0–49.7)
MCH: 32.4 pg (ref 25.1–34.0)
MCHC: 33.9 g/dL (ref 31.5–36.0)
MCV: 95.7 fL (ref 79.5–101.0)
MONO#: 0.5 10*3/uL (ref 0.1–0.9)
MONO%: 8.8 % (ref 0.0–14.0)
NEUT#: 3 10*3/uL (ref 1.5–6.5)
NEUT%: 56 % (ref 38.4–76.8)
Platelets: 190 10*3/uL (ref 145–400)
RBC: 4.41 10*6/uL (ref 3.70–5.45)
RDW: 14.2 % (ref 11.2–14.5)
WBC: 5.4 10*3/uL (ref 3.9–10.3)
lymph#: 1.8 10*3/uL (ref 0.9–3.3)

## 2017-02-24 MED ORDER — FEXOFENADINE HCL 180 MG PO TABS: 180.0000 mg | ORAL_TABLET | Freq: Every day | ORAL | Status: DC

## 2017-02-24 MED ORDER — TRIAMCINOLONE ACETONIDE 55 MCG/ACT NA AERO: 2.0000 | INHALATION_SPRAY | Freq: Every day | NASAL | 12 refills | Status: DC

## 2017-02-24 NOTE — Assessment & Plan Note (Signed)
Stage IIIa right breast cancer T2 N2 M0 diagnosed in June 2005 in [place] treated with mastectomy followed by adjuvant chemotherapy. 7 on 9 lymph nodes were apparently involved., Did not get adjuvant radiation (Unknown reasons). Current treatment: Arimidex 1 mg daily started January 2006 Arimidex toxicities: 1. Bone density revealed osteopenia: Patient is on weightbearing exercise program last bone density March 2018 showed a T score of -1.7  Surveillance: Mammogram 01/10/2017: No evidence of malignancy, breast density category C  Return to clinic once a year for follow-up

## 2017-02-24 NOTE — Progress Notes (Signed)
Patient Care Team: Leeroy Cha, MD as PCP - General (Internal Medicine) Leeroy Cha, MD as Referring Physician (Internal Medicine)  DIAGNOSIS:  Encounter Diagnosis  Name Primary?  . Malignant neoplasm of lower-outer quadrant of right breast of female, estrogen receptor positive ([place])     SUMMARY OF ONCOLOGIC HISTORY:   Breast cancer, right breast ([place])   04/11/2004 Initial Diagnosis    Patient found right breast cancer on breast self exam after negative mammogram same year, diagnosis June 2005 in [place]      04/26/2004 Surgery    Right mastectomy: T2 N2a M0 ER/PR positive HER-2 negative stage IIIa; 9 lymph nodes were involved (did not get radiation)      05/18/2004 - 09/21/2004 Chemotherapy    NSABP B 28 clinical trial with dose dense Adriamycin and Cytoxan followed by Taxol       11/12/2004 -  Anti-estrogen oral therapy    Arimidex 1 mg daily       CHIEF COMPLIANT: Follow-up on Arimidex therapy and change in providers from Dr. Marko Plume to myself  INTERVAL HISTORY: Barbara Rangel is a 71 year old with above-mentioned history of right breast cancer treated with mastectomy and adjuvant chemotherapy and she has been on oral antiestrogen therapy since 2006. She has tolerated Arimidex extremely well without any major problems or concerns. Her bone densities have also shown mild osteopenia. She has wished to continue with extended adjuvant therapy beyond 10 years. She was treated by Dr. Marko Plume all these years and because of her retirement she has decided to come to see me for her oncology care. She denies any pain lumps or nodules in the breasts. She stays very active at the gym as well as with [employer].  REVIEW OF SYSTEMS:   Constitutional: Denies fevers, chills or abnormal weight loss Eyes: Denies blurriness of vision Ears, nose, mouth, throat, and face: Denies mucositis or sore throat Respiratory: Denies cough, dyspnea or wheezes Cardiovascular:  Denies palpitation, chest discomfort Gastrointestinal:  Denies nausea, heartburn or change in bowel habits Skin: Denies abnormal skin rashes Lymphatics: Denies new lymphadenopathy or easy bruising Neurological:Denies numbness, tingling or new weaknesses Behavioral/Psych: Mood is stable, no new changes  Extremities: No lower extremity edema Breast:  denies any pain or lumps or nodules in either breasts All other systems were reviewed with the patient and are negative.  I have reviewed the past medical history, past surgical history, social history and family history with the patient and they are unchanged from previous note.  ALLERGIES:  is allergic to nitrous oxide; avelox [moxifloxacin hcl in nacl]; cephalexin; other; quinolones; tramadol; trimethoprim; and citalopram hydrobromide.  MEDICATIONS:  Current Outpatient Prescriptions  Medication Sig Dispense Refill  . acetaminophen (TYLENOL) 500 MG tablet Take 1,000 mg by mouth at bedtime.    Marland Kitchen alendronate (FOSAMAX) 70 MG tablet Take 1 tablet (70 mg total) by mouth every 7 (seven) days. Hold while in hospital. Thursday.  Take with a full glass of water on an empty stomach. (Patient taking differently: Take 70 mg by mouth every Saturday. Take with a full glass of water on an empty stomach.) 4 tablet 2  . amLODipine (NORVASC) 5 MG tablet Take 5 mg by mouth at bedtime.     Marland Kitchen anastrozole (ARIMIDEX) 1 MG tablet Take 1 tablet (1 mg total) by mouth daily. 90 tablet 3  . aspirin EC 81 MG tablet Take 81 mg by mouth every morning.     Marland Kitchen atorvastatin (LIPITOR) 20 MG tablet Take 1 tablet by  mouth every morning.    . calcium citrate-vitamin D (CITRACAL+D) 315-200 MG-UNIT per tablet Take 2 tablets by mouth 2 (two) times daily.     . cetirizine (ZYRTEC) 10 MG tablet Take 10 mg by mouth daily.    . Cranberry Extract 250 MG TABS Take 1 capsule by mouth at bedtime.     . docusate sodium (COLACE) 100 MG capsule Take 100 mg by mouth 2 (two) times daily.      .  fluticasone (FLONASE) 50 MCG/ACT nasal spray Place 1 spray into both nostrils daily.    Marland Kitchen latanoprost (XALATAN) 0.005 % ophthalmic solution Place 1 drop into both eyes at bedtime.      Marland Kitchen LORazepam (ATIVAN) 0.5 MG tablet Take 0.5-1 mg by mouth 3 (three) times daily as needed. Reported on 03/14/2016  0  . Magnesium 250 MG TABS Take 1 tablet by mouth daily.    . Multiple Vitamins-Minerals (MULTIVITAMINS THER. W/MINERALS) TABS Take 1 tablet by mouth every morning.     Marland Kitchen omeprazole (PRILOSEC) 40 MG capsule Take 40 mg by mouth every morning.      No current facility-administered medications for this visit.     PHYSICAL EXAMINATION: ECOG PERFORMANCE STATUS: 0 - Asymptomatic  Vitals:   02/24/17 1024  BP: 140/61  Pulse: 72  Resp: 18  Temp: 97.9 F (36.6 C)   Filed Weights   02/24/17 1024  Weight: 130 lb 12.8 oz (59.3 kg)    GENERAL:alert, no distress and comfortable SKIN: skin color, texture, turgor are normal, no rashes or significant lesions EYES: normal, Conjunctiva are pink and non-injected, sclera clear OROPHARYNX:no exudate, no erythema and lips, buccal mucosa, and tongue normal  NECK: supple, thyroid normal size, non-tender, without nodularity LYMPH:  no palpable lymphadenopathy in the cervical, axillary or inguinal LUNGS: clear to auscultation and percussion with normal breathing effort HEART: regular rate & rhythm and no murmurs and no lower extremity edema ABDOMEN:abdomen soft, non-tender and normal bowel sounds MUSCULOSKELETAL:no cyanosis of digits and no clubbing  NEURO: alert & oriented x 3 with fluent speech, no focal motor/sensory deficits EXTREMITIES: No lower extremity edema BREAST: No palpable masses or nodules . No palpable axillary supraclavicular or infraclavicular adenopathy no breast tenderness or nipple discharge. (exam performed in the presence of a chaperone)  LABORATORY DATA:  I have reviewed the data as listed   Chemistry      Component Value Date/Time    NA 143 02/24/2017 0949   K 4.0 02/24/2017 0949   CL 105 03/01/2016 1826   CL 108 (H) 09/14/2012 1009   CO2 28 02/24/2017 0949   BUN 14.4 02/24/2017 0949   CREATININE 0.8 02/24/2017 0949      Component Value Date/Time   CALCIUM 10.4 02/24/2017 0949   ALKPHOS 60 02/24/2017 0949   AST 27 02/24/2017 0949   ALT 26 02/24/2017 0949   BILITOT 0.60 02/24/2017 0949       Lab Results  Component Value Date   WBC 5.4 02/24/2017   HGB 14.3 02/24/2017   HCT 42.2 02/24/2017   MCV 95.7 02/24/2017   PLT 190 02/24/2017   NEUTROABS 3.0 02/24/2017    ASSESSMENT & PLAN:  Breast cancer, right breast Stage IIIa right breast cancer T2 N2 M0 diagnosed in June 2005 in [place] treated with mastectomy followed by adjuvant chemotherapy. 7 on 9 lymph nodes were apparently involved., Did not get adjuvant radiation (Unknown reasons). Current treatment: Arimidex 1 mg daily started January 2006  Arimidex toxicities: Denies any hot  flashes or myalgias. 1. Bone density revealed osteopenia: Patient is on weightbearing exercise program last bone density March 2018 showed a T score of -1.7 We discussed at length about duration of antiestrogen therapy. There is no data for risks or benefits beyond 10 years of antiestrogen therapy. However patient wishes to continue with the same treatment given the high risk to begin with.  Surveillance: Mammogram 01/10/2017: No evidence of malignancy, breast density category C Breast exam 02/24/2017: Right mastectomy and no palpable abnormalities in the left breast. Return to clinic once a year for follow-up  I spent 60 minutes and he is all( in reviewing the patient's chart and going through the notes and records from 2005 up to now and Dr. Mariana Kaufman treatments since I'm assuming the patient's oncology care from now onwards.. I spent 25 minutes talking to the patient of which more than half was spent in counseling and coordination of care.  No orders of the defined types  were placed in this encounter.  The patient has a good understanding of the overall plan. she agrees with it. she will call with any problems that may develop before the next visit here.   Rulon Eisenmenger, MD 02/24/17

## 2017-02-26 DIAGNOSIS — I10 Essential (primary) hypertension: Secondary | ICD-10-CM | POA: Diagnosis not present

## 2017-02-26 DIAGNOSIS — B370 Candidal stomatitis: Secondary | ICD-10-CM | POA: Diagnosis not present

## 2017-03-07 DIAGNOSIS — R102 Pelvic and perineal pain: Secondary | ICD-10-CM | POA: Diagnosis not present

## 2017-03-07 DIAGNOSIS — N3281 Overactive bladder: Secondary | ICD-10-CM | POA: Diagnosis not present

## 2017-03-07 DIAGNOSIS — M62838 Other muscle spasm: Secondary | ICD-10-CM | POA: Diagnosis not present

## 2017-03-07 DIAGNOSIS — M6281 Muscle weakness (generalized): Secondary | ICD-10-CM | POA: Diagnosis not present

## 2017-03-27 DIAGNOSIS — R102 Pelvic and perineal pain: Secondary | ICD-10-CM | POA: Diagnosis not present

## 2017-03-27 DIAGNOSIS — R350 Frequency of micturition: Secondary | ICD-10-CM | POA: Diagnosis not present

## 2017-03-27 DIAGNOSIS — M6281 Muscle weakness (generalized): Secondary | ICD-10-CM | POA: Diagnosis not present

## 2017-03-27 DIAGNOSIS — M62838 Other muscle spasm: Secondary | ICD-10-CM | POA: Diagnosis not present

## 2017-04-17 DIAGNOSIS — M6281 Muscle weakness (generalized): Secondary | ICD-10-CM | POA: Diagnosis not present

## 2017-04-17 DIAGNOSIS — N3281 Overactive bladder: Secondary | ICD-10-CM | POA: Diagnosis not present

## 2017-04-17 DIAGNOSIS — M62838 Other muscle spasm: Secondary | ICD-10-CM | POA: Diagnosis not present

## 2017-04-17 DIAGNOSIS — R102 Pelvic and perineal pain: Secondary | ICD-10-CM | POA: Diagnosis not present

## 2017-05-05 DIAGNOSIS — H401131 Primary open-angle glaucoma, bilateral, mild stage: Secondary | ICD-10-CM | POA: Diagnosis not present

## 2017-05-08 DIAGNOSIS — R102 Pelvic and perineal pain: Secondary | ICD-10-CM | POA: Diagnosis not present

## 2017-05-08 DIAGNOSIS — N3281 Overactive bladder: Secondary | ICD-10-CM | POA: Diagnosis not present

## 2017-05-08 DIAGNOSIS — R350 Frequency of micturition: Secondary | ICD-10-CM | POA: Diagnosis not present

## 2017-05-08 DIAGNOSIS — M62838 Other muscle spasm: Secondary | ICD-10-CM | POA: Diagnosis not present

## 2017-05-28 DIAGNOSIS — N3281 Overactive bladder: Secondary | ICD-10-CM | POA: Diagnosis not present

## 2017-05-28 DIAGNOSIS — M62838 Other muscle spasm: Secondary | ICD-10-CM | POA: Diagnosis not present

## 2017-05-28 DIAGNOSIS — R102 Pelvic and perineal pain: Secondary | ICD-10-CM | POA: Diagnosis not present

## 2017-05-28 DIAGNOSIS — M6281 Muscle weakness (generalized): Secondary | ICD-10-CM | POA: Diagnosis not present

## 2017-06-04 DIAGNOSIS — N3281 Overactive bladder: Secondary | ICD-10-CM | POA: Diagnosis not present

## 2017-06-04 DIAGNOSIS — R350 Frequency of micturition: Secondary | ICD-10-CM | POA: Diagnosis not present

## 2017-06-05 ENCOUNTER — Encounter: Payer: Self-pay | Admitting: Vascular Surgery

## 2017-06-05 DIAGNOSIS — M5136 Other intervertebral disc degeneration, lumbar region: Secondary | ICD-10-CM | POA: Diagnosis not present

## 2017-06-09 DIAGNOSIS — I10 Essential (primary) hypertension: Secondary | ICD-10-CM | POA: Diagnosis not present

## 2017-06-09 DIAGNOSIS — E785 Hyperlipidemia, unspecified: Secondary | ICD-10-CM | POA: Diagnosis not present

## 2017-06-09 DIAGNOSIS — G44209 Tension-type headache, unspecified, not intractable: Secondary | ICD-10-CM | POA: Diagnosis not present

## 2017-06-17 ENCOUNTER — Encounter (HOSPITAL_COMMUNITY): Payer: Medicare Other

## 2017-06-17 ENCOUNTER — Ambulatory Visit: Payer: Medicare Other | Admitting: Vascular Surgery

## 2017-06-18 DIAGNOSIS — G4452 New daily persistent headache (NDPH): Secondary | ICD-10-CM | POA: Diagnosis not present

## 2017-06-18 DIAGNOSIS — J3489 Other specified disorders of nose and nasal sinuses: Secondary | ICD-10-CM | POA: Diagnosis not present

## 2017-06-19 ENCOUNTER — Ambulatory Visit
Admission: RE | Admit: 2017-06-19 | Discharge: 2017-06-19 | Disposition: A | Discharge status: Home / Self Care | Payer: Medicare Other | Source: Ambulatory Visit | Attending: Internal Medicine | Admitting: Internal Medicine

## 2017-06-19 ENCOUNTER — Other Ambulatory Visit: Payer: Self-pay | Admitting: Internal Medicine

## 2017-06-19 DIAGNOSIS — J3489 Other specified disorders of nose and nasal sinuses: Secondary | ICD-10-CM

## 2017-06-19 DIAGNOSIS — J329 Chronic sinusitis, unspecified: Secondary | ICD-10-CM | POA: Diagnosis not present

## 2017-06-24 ENCOUNTER — Encounter: Payer: Self-pay | Admitting: Vascular Surgery

## 2017-06-24 ENCOUNTER — Ambulatory Visit (INDEPENDENT_AMBULATORY_CARE_PROVIDER_SITE_OTHER): Payer: Medicare Other | Admitting: Vascular Surgery

## 2017-06-24 ENCOUNTER — Ambulatory Visit (HOSPITAL_COMMUNITY)
Admission: RE | Admit: 2017-06-24 | Discharge: 2017-06-24 | Disposition: A | Discharge status: Home / Self Care | Payer: Medicare Other | Source: Ambulatory Visit | Attending: Vascular Surgery | Admitting: Vascular Surgery

## 2017-06-24 VITALS — BP 127/73 | HR 77 | Temp 97.4°F | Resp 16 | Ht 65.0 in

## 2017-06-24 DIAGNOSIS — I6523 Occlusion and stenosis of bilateral carotid arteries: Secondary | ICD-10-CM | POA: Diagnosis not present

## 2017-06-24 DIAGNOSIS — I6522 Occlusion and stenosis of left carotid artery: Secondary | ICD-10-CM

## 2017-06-24 LAB — VAS US CAROTID
LEFT ECA DIAS: -13 cm/s
Left CCA dist dias: 25 cm/s
Left CCA dist sys: 87 cm/s
Left CCA prox dias: 26 cm/s
Left CCA prox sys: 100 cm/s
Left ICA dist dias: 57 cm/s
Left ICA dist sys: 173 cm/s
Left ICA prox dias: -21 cm/s
Left ICA prox sys: -64 cm/s
RIGHT CCA MID DIAS: 18 cm/s
RIGHT ECA DIAS: -10 cm/s
Right CCA prox dias: -18 cm/s
Right CCA prox sys: -80 cm/s
Right cca dist sys: -130 cm/s

## 2017-06-24 NOTE — Progress Notes (Signed)
Vascular and Vein Specialist of [facility]  Patient name: Barbara Rangel MRN: [id number] DOB: [birth year] Sex: female  REASON FOR VISIT: Follow-up asymptomatic carotid disease  HPI: Barbara Rangel is a 71 y.o. female here today for follow-up. I had seen her 6 months ago. She had a carotid duplex and an outlying center suggesting high-grade left carotid stenosis. We repeated her duplex that time and did not see this level of stenosis and recommended follow-up only. She is here today for repeat carotid duplex. She specifically denies any symptoms of amaurosis fugax, aphasia or transient ischemic attack or stroke. She does have a occasional headaches. Denies any new cardiac difficulty.  Past Medical History:  Diagnosis Date  . Arthritis    degenerative arthritis  . Cancer ([place])    hx breast cancer-right  . Chronic back pain    right radicular leg pain  . Chronic urinary tract infection    takes Macrodantin and Cranberry daily  . Constipation    r/t pain meds and takes Dulcolax nightly  . FHx: colonic polyps    hx of and mother had colon cancer  . Glaucoma    uses Xalantan nightly  . Hypertension    takes Amlodipine daily  . Other and unspecified general anesthetics causing adverse effect in therapeutic use    hard to wake up  . PONV (postoperative nausea and vomiting)     Family History  Problem Relation Age of Onset  . Colon cancer Mother   . Anesthesia problems Neg Hx   . Hypotension Neg Hx   . Malignant hyperthermia Neg Hx   . Pseudochol deficiency Neg Hx     SOCIAL HISTORY: Social History  Substance Use Topics  . Smoking status: Never Smoker  . Smokeless tobacco: Never Used  . Alcohol use No    Allergies  Allergen Reactions  . Nitrous Oxide Other (See Comments)    Pt passed out and had confusion. Took a few hours to wake her up.  . Avelox [Moxifloxacin Hcl In Nacl] Other (See Comments)    Severe headache   .  Cephalexin Other (See Comments)    Caused headache  . Other Other (See Comments)    Anesthesia, needs antiemetic med  . Quinolones Other (See Comments)    Headaches?  . Tramadol Nausea Only and Other (See Comments)    Nausea and Dizziness, too.  . Trimethoprim Other (See Comments)    Rapid heartbeat.  . Citalopram Hydrobromide Other (See Comments)    Pt can't remember what the side effect was.    Current Outpatient Prescriptions  Medication Sig Dispense Refill  . acetaminophen (TYLENOL) 500 MG tablet Take 1,000 mg by mouth at bedtime.    Marland Kitchen alendronate (FOSAMAX) 70 MG tablet Take 1 tablet (70 mg total) by mouth every 7 (seven) days. Hold while in hospital. Thursday.  Take with a full glass of water on an empty stomach. (Patient taking differently: Take 70 mg by mouth every Saturday. Take with a full glass of water on an empty stomach.) 4 tablet 2  . amLODipine (NORVASC) 5 MG tablet Take 5 mg by mouth at bedtime.     Marland Kitchen anastrozole (ARIMIDEX) 1 MG tablet Take 1 tablet (1 mg total) by mouth daily. 90 tablet 3  . aspirin EC 81 MG tablet Take 81 mg by mouth every morning.     Marland Kitchen atorvastatin (LIPITOR) 20 MG tablet Take 1 tablet by mouth every morning.    . calcium citrate-vitamin D (  CITRACAL+D) 315-200 MG-UNIT per tablet Take 2 tablets by mouth 2 (two) times daily.     . Cranberry Extract 250 MG TABS Take 1 capsule by mouth at bedtime.     . docusate sodium (COLACE) 100 MG capsule Take 100 mg by mouth 2 (two) times daily.      . fluticasone (FLONASE ALLERGY RELIEF) 50 MCG/ACT nasal spray Place 1 spray into both nostrils daily.    Marland Kitchen latanoprost (XALATAN) 0.005 % ophthalmic solution Place 1 drop into both eyes at bedtime.      Marland Kitchen loratadine (CLARITIN) 10 MG tablet Take 10 mg by mouth daily.    Marland Kitchen LORazepam (ATIVAN) 0.5 MG tablet Take 0.5-1 mg by mouth 3 (three) times daily as needed. Reported on 03/14/2016  0  . Magnesium 250 MG TABS Take 1 tablet by mouth daily.    . Multiple Vitamins-Minerals  (MULTIVITAMINS THER. W/MINERALS) TABS Take 1 tablet by mouth every morning.     Marland Kitchen omeprazole (PRILOSEC) 40 MG capsule Take 40 mg by mouth every morning.      No current facility-administered medications for this visit.     REVIEW OF SYSTEMS:  [X]  denotes positive finding, [ ]  denotes negative finding Cardiac  Comments:  Chest pain or chest pressure:    Shortness of breath upon exertion:    Short of breath when lying flat:    Irregular heart rhythm:        Vascular    Pain in calf, thigh, or hip brought on by ambulation:    Pain in feet at night that wakes you up from your sleep:     Blood clot in your veins:    Leg swelling:           PHYSICAL EXAM: Vitals:   06/24/17 1137  BP: 127/73  Pulse: 77  Resp: 16  Temp: (!) 97.4 F (36.3 C)  TempSrc: Oral  SpO2: 100%  Height: 5\' 5"  (1.651 m)    GENERAL: The patient is a well-nourished female, in no acute distress. The vital signs are documented above. CARDIOVASCULAR: Carotid arteries without bruits bilaterally. 2+ radial and 2+ dorsalis pedis pulses bilaterally PULMONARY: There is good air exchange  MUSCULOSKELETAL: There are no major deformities or cyanosis. NEUROLOGIC: No focal weakness or paresthesias are detected. SKIN: There are no ulcers or rashes noted. PSYCHIATRIC: The patient has a normal affect.  DATA:  Carotid duplex today was reviewed with the patient. This reveals moderate 40-59% stenoses in her internal carotid arteries bilaterally.  MEDICAL ISSUES: Gaseous length with patient. Explained that this is any increased risk for stroke. Explained that that she apparently did have a over estimation of her degree of left carotid stenosis and outlying lab since we have had to steady his nerves showing no high-grade stenosis. She will continue her usual activities. We will see her on a yearly basis for repeat ultrasound. She will present immediately to the emergency room should she develop any neurologic  deficits    Rosetta Posner, MD [facility] Vascular and Vein Specialists of [facility] Tel [phone] Pager [phone]

## 2017-06-30 NOTE — Addendum Note (Signed)
Addended by: Lianne Cure A on: 06/30/2017 04:47 PM   Modules accepted: Orders

## 2017-07-04 DIAGNOSIS — R102 Pelvic and perineal pain: Secondary | ICD-10-CM | POA: Diagnosis not present

## 2017-07-04 DIAGNOSIS — M6281 Muscle weakness (generalized): Secondary | ICD-10-CM | POA: Diagnosis not present

## 2017-07-04 DIAGNOSIS — N3281 Overactive bladder: Secondary | ICD-10-CM | POA: Diagnosis not present

## 2017-07-04 DIAGNOSIS — M62838 Other muscle spasm: Secondary | ICD-10-CM | POA: Diagnosis not present

## 2017-07-31 DIAGNOSIS — M6281 Muscle weakness (generalized): Secondary | ICD-10-CM | POA: Diagnosis not present

## 2017-07-31 DIAGNOSIS — M62838 Other muscle spasm: Secondary | ICD-10-CM | POA: Diagnosis not present

## 2017-07-31 DIAGNOSIS — N3281 Overactive bladder: Secondary | ICD-10-CM | POA: Diagnosis not present

## 2017-07-31 DIAGNOSIS — R102 Pelvic and perineal pain: Secondary | ICD-10-CM | POA: Diagnosis not present

## 2017-08-04 DIAGNOSIS — H401131 Primary open-angle glaucoma, bilateral, mild stage: Secondary | ICD-10-CM | POA: Diagnosis not present

## 2017-08-06 DIAGNOSIS — Z6820 Body mass index (BMI) 20.0-20.9, adult: Secondary | ICD-10-CM | POA: Diagnosis not present

## 2017-08-06 DIAGNOSIS — I779 Disorder of arteries and arterioles, unspecified: Secondary | ICD-10-CM | POA: Diagnosis not present

## 2017-08-06 DIAGNOSIS — Z23 Encounter for immunization: Secondary | ICD-10-CM | POA: Diagnosis not present

## 2017-08-06 DIAGNOSIS — I10 Essential (primary) hypertension: Secondary | ICD-10-CM | POA: Diagnosis not present

## 2017-08-06 DIAGNOSIS — E785 Hyperlipidemia, unspecified: Secondary | ICD-10-CM | POA: Diagnosis not present

## 2017-08-19 DIAGNOSIS — J302 Other seasonal allergic rhinitis: Secondary | ICD-10-CM | POA: Diagnosis not present

## 2017-08-19 DIAGNOSIS — H6983 Other specified disorders of Eustachian tube, bilateral: Secondary | ICD-10-CM | POA: Diagnosis not present

## 2017-08-19 DIAGNOSIS — H6123 Impacted cerumen, bilateral: Secondary | ICD-10-CM | POA: Diagnosis not present

## 2017-08-27 DIAGNOSIS — E785 Hyperlipidemia, unspecified: Secondary | ICD-10-CM | POA: Diagnosis not present

## 2017-08-27 DIAGNOSIS — H7091 Unspecified mastoiditis, right ear: Secondary | ICD-10-CM | POA: Diagnosis not present

## 2017-08-28 ENCOUNTER — Encounter: Payer: Self-pay | Admitting: Neurology

## 2017-08-28 ENCOUNTER — Ambulatory Visit (INDEPENDENT_AMBULATORY_CARE_PROVIDER_SITE_OTHER): Payer: Medicare Other | Admitting: Neurology

## 2017-08-28 VITALS — BP 133/79 | HR 80 | Ht 65.0 in | Wt 124.0 lb

## 2017-08-28 DIAGNOSIS — I6522 Occlusion and stenosis of left carotid artery: Secondary | ICD-10-CM | POA: Diagnosis not present

## 2017-08-28 DIAGNOSIS — R419 Unspecified symptoms and signs involving cognitive functions and awareness: Secondary | ICD-10-CM | POA: Diagnosis not present
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
## 2017-08-28 NOTE — Patient Instructions (Addendum)
Please watch your nutrition, you have lost weight compared to last year, and really don't need to lose weight.   Please make sure you drink water well enough.  Your exam and memory scores look good. Please follow up with your regular physician routinely as scheduled.   You look well!

## 2017-09-12 DIAGNOSIS — J300 Vasomotor rhinitis: Secondary | ICD-10-CM | POA: Diagnosis not present

## 2017-09-12 DIAGNOSIS — H6993 Unspecified Eustachian tube disorder, bilateral: Secondary | ICD-10-CM | POA: Diagnosis not present

## 2017-09-12 DIAGNOSIS — J310 Chronic rhinitis: Secondary | ICD-10-CM | POA: Diagnosis not present

## 2017-09-23 DIAGNOSIS — J329 Chronic sinusitis, unspecified: Secondary | ICD-10-CM | POA: Diagnosis not present

## 2017-10-24 ENCOUNTER — Other Ambulatory Visit: Payer: Self-pay | Admitting: Hematology and Oncology

## 2017-10-24 DIAGNOSIS — C50911 Malignant neoplasm of unspecified site of right female breast: Secondary | ICD-10-CM

## 2017-10-31 DIAGNOSIS — H401131 Primary open-angle glaucoma, bilateral, mild stage: Secondary | ICD-10-CM | POA: Diagnosis not present

## 2017-11-20 ENCOUNTER — Other Ambulatory Visit: Payer: Self-pay

## 2017-11-20 DIAGNOSIS — C50011 Malignant neoplasm of nipple and areola, right female breast: Secondary | ICD-10-CM

## 2017-11-20 DIAGNOSIS — M8580 Other specified disorders of bone density and structure, unspecified site: Secondary | ICD-10-CM

## 2017-11-20 NOTE — Progress Notes (Signed)
Called pt to let her know that orders were placed for her bone density and mammogram. Pt may contact the [facility] Breast [facility] to set up her annual appointment. LVM with call back number. Unable to speak to pt.

## 2017-11-24 ENCOUNTER — Other Ambulatory Visit: Payer: Self-pay | Admitting: Hematology and Oncology

## 2017-11-24 DIAGNOSIS — Z1231 Encounter for screening mammogram for malignant neoplasm of breast: Secondary | ICD-10-CM

## 2017-11-24 DIAGNOSIS — C50011 Malignant neoplasm of nipple and areola, right female breast: Secondary | ICD-10-CM

## 2017-12-17 DIAGNOSIS — M150 Primary generalized (osteo)arthritis: Secondary | ICD-10-CM | POA: Diagnosis not present

## 2017-12-17 DIAGNOSIS — C50911 Malignant neoplasm of unspecified site of right female breast: Secondary | ICD-10-CM | POA: Diagnosis not present

## 2017-12-17 DIAGNOSIS — I10 Essential (primary) hypertension: Secondary | ICD-10-CM | POA: Diagnosis not present

## 2017-12-17 DIAGNOSIS — E785 Hyperlipidemia, unspecified: Secondary | ICD-10-CM | POA: Diagnosis not present

## 2018-01-12 ENCOUNTER — Ambulatory Visit
Admission: RE | Admit: 2018-01-12 | Discharge: 2018-01-12 | Disposition: A | Discharge status: Home / Self Care | Payer: Medicare Other | Source: Ambulatory Visit | Attending: Hematology and Oncology | Admitting: Hematology and Oncology

## 2018-01-12 DIAGNOSIS — Z1231 Encounter for screening mammogram for malignant neoplasm of breast: Secondary | ICD-10-CM | POA: Diagnosis not present

## 2018-01-12 DIAGNOSIS — M8580 Other specified disorders of bone density and structure, unspecified site: Secondary | ICD-10-CM

## 2018-01-12 DIAGNOSIS — C50011 Malignant neoplasm of nipple and areola, right female breast: Secondary | ICD-10-CM

## 2018-01-12 DIAGNOSIS — Z780 Asymptomatic menopausal state: Secondary | ICD-10-CM | POA: Diagnosis not present

## 2018-01-12 DIAGNOSIS — M85851 Other specified disorders of bone density and structure, right thigh: Secondary | ICD-10-CM | POA: Diagnosis not present

## 2018-01-20 ENCOUNTER — Inpatient Hospital Stay: Payer: Medicare Other | Attending: Adult Health | Admitting: Adult Health

## 2018-01-20 ENCOUNTER — Encounter: Payer: Self-pay | Admitting: Adult Health

## 2018-01-20 ENCOUNTER — Telehealth: Payer: Self-pay | Admitting: Adult Health

## 2018-01-20 VITALS — BP 139/67 | HR 77 | Temp 98.4°F | Resp 18 | Ht 65.0 in | Wt 133.4 lb

## 2018-01-20 DIAGNOSIS — Z170 Estrogen receptor positive status [ER+]: Secondary | ICD-10-CM | POA: Insufficient documentation

## 2018-01-20 DIAGNOSIS — Z7982 Long term (current) use of aspirin: Secondary | ICD-10-CM | POA: Diagnosis not present

## 2018-01-20 DIAGNOSIS — Z79811 Long term (current) use of aromatase inhibitors: Secondary | ICD-10-CM | POA: Insufficient documentation

## 2018-01-20 DIAGNOSIS — Z9221 Personal history of antineoplastic chemotherapy: Secondary | ICD-10-CM | POA: Insufficient documentation

## 2018-01-20 DIAGNOSIS — Z9011 Acquired absence of right breast and nipple: Secondary | ICD-10-CM | POA: Insufficient documentation

## 2018-01-20 DIAGNOSIS — Z79899 Other long term (current) drug therapy: Secondary | ICD-10-CM | POA: Insufficient documentation

## 2018-01-20 DIAGNOSIS — C50011 Malignant neoplasm of nipple and areola, right female breast: Secondary | ICD-10-CM | POA: Diagnosis not present

## 2018-01-20 DIAGNOSIS — M8580 Other specified disorders of bone density and structure, unspecified site: Secondary | ICD-10-CM | POA: Insufficient documentation

## 2018-01-20 DIAGNOSIS — I10 Essential (primary) hypertension: Secondary | ICD-10-CM | POA: Diagnosis not present

## 2018-01-20 DIAGNOSIS — Z803 Family history of malignant neoplasm of breast: Secondary | ICD-10-CM | POA: Diagnosis not present

## 2018-01-20 NOTE — Progress Notes (Signed)
CLINIC:  Survivorship   REASON FOR VISIT:  Routine follow-up post-treatment for a recent history of breast cancer.  BRIEF ONCOLOGIC HISTORY:    Breast cancer, right breast ([place])   01/10/2004 Initial Diagnosis    Patient found right breast cancer on breast self exam after negative mammogram same year, diagnosis March 2005 in [place]      04/26/2004 Surgery    Right mastectomy: T2 N2a M0 ER/PR positive HER-2 negative stage IIIa; 9 lymph nodes were involved (did not get radiation)      05/18/2004 - 09/21/2004 Chemotherapy    NSABP B 28 clinical trial with dose dense Adriamycin and Cytoxan followed by Taxol       11/12/2004 -  Anti-estrogen oral therapy    Arimidex 1 mg daily       INTERVAL HISTORY:  [name] presents to the [facility] today for our initial meeting to review her survivorship care plan detailing her treatment course for breast cancer, as well as monitoring long-term side effects of that treatment, education regarding health maintenance, screening, and overall wellness and health promotion.     Overall, [name] reports feeling quite well.  She is continuing on Anastrozole and tolerates it well.  She is also taking Fosamax weekly.  She sees her PCP regularly and exercises regularly as well.      REVIEW OF SYSTEMS:  Review of Systems  Constitutional: Negative for appetite change, chills, fatigue, fever and unexpected weight change.  HENT:   Negative for hearing loss, lump/mass and trouble swallowing.   Eyes: Negative for eye problems and icterus.  Respiratory: Negative for chest tightness, cough and shortness of breath.   Cardiovascular: Negative for chest pain, leg swelling and palpitations.  Gastrointestinal: Negative for abdominal distention, abdominal pain, constipation, diarrhea, nausea and vomiting.  Endocrine: Negative for hot flashes.  Skin: Negative for itching and rash.  Neurological: Negative for dizziness, extremity weakness, headaches  and numbness.  Hematological: Negative for adenopathy. Does not bruise/bleed easily.  Psychiatric/Behavioral: Negative for depression. The patient is not nervous/anxious.    Breast: Denies any new nodularity, masses, tenderness, nipple changes, or nipple discharge.       PAST MEDICAL/SURGICAL HISTORY:  Past Medical History:  Diagnosis Date  . Arthritis    degenerative arthritis  . Cancer ([place])    hx breast cancer-right  . Chronic back pain    right radicular leg pain  . Chronic urinary tract infection    takes Macrodantin and Cranberry daily  . Constipation    r/t pain meds and takes Dulcolax nightly  . FHx: colonic polyps    hx of and mother had colon cancer  . Glaucoma    uses Xalantan nightly  . Hypertension    takes Amlodipine daily  . Other and unspecified general anesthetics causing adverse effect in therapeutic use    hard to wake up  . PONV (postoperative nausea and vomiting)    Past Surgical History:  Procedure Laterality Date  . ANKLE SURGERY  2011   left ankle with screws and plates  . BREAST LUMPECTOMY     x2  . cataract surgery  2005   right eye  . COLONOSCOPY    . COLONOSCOPY WITH PROPOFOL N/A 10/11/2014   Procedure: COLONOSCOPY WITH PROPOFOL;  Surgeon: Garlan Fair, MD;  Location: [facility];  Service: Endoscopy;  Laterality: N/A;  . EYE SURGERY  2004   right eye-detached retina  . left breast biopsy  2009  . LUMBAR LAMINECTOMY/DECOMPRESSION MICRODISCECTOMY  10/31/2011   Procedure: LUMBAR LAMINECTOMY/DECOMPRESSION MICRODISCECTOMY;  Surgeon: Dahlia Bailiff;  Location: [facility];  Service: Orthopedics;  Laterality: Right;  L4-5 Decompression with Right Facet Decompression   . MASTECTOMY  2005   right  d/t breast cancer;no sticks to right arm LYMPH NODES REMOVED.  Marland Kitchen RETINAL DETACHMENT SURGERY  2004  . right breast biopsy  2005  . TUBAL LIGATION  1980     ALLERGIES:  Allergies  Allergen Reactions  . Nitrous Oxide Other (See Comments)    Pt  passed out and had confusion. Took a few hours to wake her up.  . Avelox [Moxifloxacin Hcl In Nacl] Other (See Comments)    Severe headache   . Cephalexin Other (See Comments)    Caused headache  . Other Other (See Comments)    Anesthesia, needs antiemetic med  . Quinolones Other (See Comments)    Headaches?  . Tramadol Nausea Only and Other (See Comments)    Nausea and Dizziness, too.  . Trimethoprim Other (See Comments)    Rapid heartbeat.  . Citalopram Hydrobromide Other (See Comments)    Pt can't remember what the side effect was.     CURRENT MEDICATIONS:  Outpatient Encounter Medications as of 01/20/2018  Medication Sig  . acetaminophen (TYLENOL) 500 MG tablet Take 1,000 mg by mouth at bedtime.  Marland Kitchen alendronate (FOSAMAX) 70 MG tablet Take 1 tablet (70 mg total) by mouth every 7 (seven) days. Hold while in hospital. Thursday.  Take with a full glass of water on an empty stomach. (Patient taking differently: Take 70 mg by mouth every Saturday. Take with a full glass of water on an empty stomach.)  . amLODipine (NORVASC) 5 MG tablet Take 5 mg by mouth at bedtime.   Marland Kitchen anastrozole (ARIMIDEX) 1 MG tablet TAKE 1 TABLET BY MOUTH  DAILY  . aspirin EC 81 MG tablet Take 81 mg by mouth every morning.   Marland Kitchen atorvastatin (LIPITOR) 20 MG tablet Take 1 tablet by mouth every morning.  Marland Kitchen azelastine (ASTELIN) 0.1 % nasal spray   . calcium citrate-vitamin D (CITRACAL+D) 315-200 MG-UNIT per tablet Take 2 tablets by mouth 2 (two) times daily.   . Cranberry Extract 250 MG TABS Take 1 capsule by mouth at bedtime.   . docusate sodium (COLACE) 100 MG capsule Take 100 mg by mouth 2 (two) times daily.    Marland Kitchen latanoprost (XALATAN) 0.005 % ophthalmic solution Place 1 drop into both eyes at bedtime.    Marland Kitchen levocetirizine (XYZAL) 5 MG tablet Take 5 mg by mouth daily.  Marland Kitchen LORazepam (ATIVAN) 0.5 MG tablet Take 0.5-1 mg by mouth 3 (three) times daily as needed. Reported on 03/14/2016  . Magnesium 250 MG TABS Take by mouth  daily.  . Multiple Vitamins-Minerals (MULTIVITAMINS THER. W/MINERALS) TABS Take 1 tablet by mouth every morning.   Marland Kitchen omeprazole (PRILOSEC) 40 MG capsule Take 40 mg by mouth every morning.   . triamcinolone (NASACORT ALLERGY 24HR) 55 MCG/ACT AERO nasal inhaler Place 2 sprays into the nose as needed.   . [DISCONTINUED] fexofenadine (ALLEGRA) 180 MG tablet Take 180 mg by mouth daily.   No facility-administered encounter medications on file as of 01/20/2018.      ONCOLOGIC FAMILY HISTORY:  Family History  Problem Relation Age of Onset  . Colon cancer Mother   . Breast cancer Cousin        on dads side  . Breast cancer Cousin        on moms side  .  Anesthesia problems Neg Hx   . Hypotension Neg Hx   . Malignant hyperthermia Neg Hx   . Pseudochol deficiency Neg Hx     SOCIAL HISTORY:  Social History   Socioeconomic History  . Marital status: Married    Spouse name: Doren Custard  . Number of children: 4  . Years of education: BS  . Highest education level: Not on file  Social Needs  . Financial resource strain: Not on file  . Food insecurity - worry: Not on file  . Food insecurity - inability: Not on file  . Transportation needs - medical: Not on file  . Transportation needs - non-medical: Not on file  Occupational History  . Occupation: Retired  Tobacco Use  . Smoking status: Never Smoker  . Smokeless tobacco: Never Used  Substance and Sexual Activity  . Alcohol use: No  . Drug use: No  . Sexual activity: Yes  Other Topics Concern  . Not on file  Social History Narrative   Pt lives at home with spouse.   Caffeine Use: none      PHYSICAL EXAMINATION:  Vital Signs:   Vitals:   01/20/18 1400  BP: 139/67  Pulse: 77  Resp: 18  Temp: 98.4 F (36.9 C)  SpO2: 100%   Filed Weights   01/20/18 1400  Weight: 133 lb 6.4 oz (60.5 kg)   General: Well-nourished, well-appearing female in no acute distress.  She is unaccompanied today.   HEENT: Head is normocephalic.   Pupils equal and reactive to light. Conjunctivae clear without exudate.  Sclerae anicteric. Oral mucosa is pink, moist.  Oropharynx is pink without lesions or erythema.  Lymph: No cervical, supraclavicular, or infraclavicular lymphadenopathy noted on palpation.  Cardiovascular: Regular rate and rhythm.Marland Kitchen Respiratory: Clear to auscultation bilaterally. Chest expansion symmetric; breathing non-labored.  GI: Abdomen soft and round; non-tender, non-distended. Bowel sounds normoactive.  GU: Deferred.  Neuro: No focal deficits. Steady gait.  Psych: Mood and affect normal and appropriate for situation.  Extremities: No edema. MSK: No focal spinal tenderness to palpation.  Full range of motion in bilateral upper extremities Skin: Warm and dry.  LABORATORY DATA:  None for this visit.  DIAGNOSTIC IMAGING:  None for this visit.      ASSESSMENT AND PLAN:  Ms.. Barbara Rangel is a pleasant 72 y.o. 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  She presents to the Survivorship Clinic for our initial meeting and routine follow-up post-completion of treatment for breast cancer.    1. Stage IIIA right breast cancer:  [name] is continuing to recover from definitive treatment for breast cancer. She will follow-up with her medical oncologist, Dr. Cleda Daub April, 2019 with history and physical exam per surveillance protocol.  I reviewed with her that we don't currently have a lot of data to continue taking Anastrozole after 10 years, and in fact, a recent study demonstrated no benefit past 7, but more risks.  She verbalized understanding of this. Today, a comprehensive survivorship care plan and treatment summary was reviewed with the patient today detailing her breast cancer diagnosis, treatment course, potential late/long-term effects of treatment, appropriate  follow-up care with recommendations for the future, and patient education resources.  A copy of this summary, along with a letter will be sent to the patient's primary care provider via mail/fax/In Basket message after today's visit.    2. Bone health:  Given Ms.  Barbara Rangel's age/history of breast cancer and her current treatment regimen including anti-estrogen therapy with Anastrozole, she is at risk for bone demineralization.  Her last DEXA scan was 01/12/2018 and demonstrated osteopenia with a T score of -1.4.  She is taking Fosamax daily.  I recommended that based on the bone density testing and the potential long term risks of bisphosphanate use (she tells me she started Fosamax in 2006), that she stop taking Fosamax.  We reviewed the importance of the consumption of foods rich in calcium, as well as increase her weight-bearing activities.  She was given education on specific activities to promote bone health.  3. Cancer screening:  Due to [name]'s history and her age, she should receive screening for skin cancers, colon cancer, and gynecologic cancers.  The information and recommendations are listed on the patient's comprehensive care plan/treatment summary and were reviewed in detail with the patient.    4. Health maintenance and wellness promotion: [name] was encouraged to consume 5-7 servings of fruits and vegetables per day. We reviewed the "Nutrition Rainbow" handout, as well as the handout "Take Control of Your Health and Reduce Your Cancer Risk" from the [facility].  She was also encouraged to engage in moderate to vigorous exercise for 30 minutes per day most days of the week. We discussed the [employer] fitness program, which is designed for cancer survivors to help them become more physically fit after cancer treatments.  She was instructed to limit her alcohol consumption and continue to abstain from tobacco use.     5. Support services/counseling: It is not uncommon for  this period of the patient's cancer care trajectory to be one of many emotions and stressors.  We discussed an opportunity for her to participate in the next session of [facility] ("Finding Your New Normal") support group series designed for patients after they have completed treatment.   [name] was encouraged to take advantage of our many other support services programs, support groups, and/or counseling in coping with her new life as a cancer survivor after completing anti-cancer treatment.  She was offered support today through active listening and expressive supportive counseling.  She was given information regarding our available services and encouraged to contact me with any questions or for help enrolling in any of our support group/programs.    Dispo:   -Return to cancer center in April for f/u with Dr. Lindi Adie -Mammogram due in 01/2019 -She is welcome to return back to the Survivorship Clinic at any time; no additional follow-up needed at this time.  -Consider referral back to survivorship as a long-term survivor for continued surveillance  A total of (30) minutes of face-to-face time was spent with this patient with greater than 50% of that time in counseling and care-coordination.   Gardenia Phlegm, NP Survivorship Program [facility] [phone]   Note: PRIMARY CARE PROVIDER Leeroy Cha, [place] [phone]

## 2018-01-20 NOTE — Telephone Encounter (Signed)
Gave patient AVS and calendar of upcoming April appointments.  °

## 2018-01-27 ENCOUNTER — Telehealth: Payer: Self-pay | Admitting: *Deleted

## 2018-01-27 NOTE — Telephone Encounter (Addendum)
"  Can you give me a call.  I have a few questions to ask you before I schedule an appointment."  I attempted to return her call.  I left her messages to call me back.  "Thanks for calling me back.  I want to know if I could schedule my next appointment with Dr. Cannon Kettle?  I saw Dr. Amalia Hailey last but only because it was only because he was the first available when I needed him.  Dr. Amalia Hailey is my regular doctor and I know he's only there on certain days."  They don't like for you to switch doctors but if you choose to do so it's fine.  Dr. Amalia Hailey has retired.  Dr. Cannon Kettle is only here on Tuesdays and Dr. Amalia Hailey is here on Mondays and Wednesdays.  "I think I have a callus that needs to be taken care of again.  I have Plantar Fasciitis and I think I need new inserts."  Would you like for me to send you to a scheduler so you can make an appointment?  "Yes, I would please."    Call was transferred to [facility] in the call center.

## 2018-02-02 DIAGNOSIS — H401131 Primary open-angle glaucoma, bilateral, mild stage: Secondary | ICD-10-CM | POA: Diagnosis not present

## 2018-02-04 DIAGNOSIS — H6993 Unspecified Eustachian tube disorder, bilateral: Secondary | ICD-10-CM | POA: Diagnosis not present

## 2018-02-04 DIAGNOSIS — J300 Vasomotor rhinitis: Secondary | ICD-10-CM | POA: Diagnosis not present

## 2018-02-06 DIAGNOSIS — Z23 Encounter for immunization: Secondary | ICD-10-CM | POA: Diagnosis not present

## 2018-02-06 DIAGNOSIS — Z1389 Encounter for screening for other disorder: Secondary | ICD-10-CM | POA: Diagnosis not present

## 2018-02-06 DIAGNOSIS — K219 Gastro-esophageal reflux disease without esophagitis: Secondary | ICD-10-CM | POA: Diagnosis not present

## 2018-02-06 DIAGNOSIS — Z853 Personal history of malignant neoplasm of breast: Secondary | ICD-10-CM | POA: Diagnosis not present

## 2018-02-06 DIAGNOSIS — J301 Allergic rhinitis due to pollen: Secondary | ICD-10-CM | POA: Diagnosis not present

## 2018-02-06 DIAGNOSIS — Z8619 Personal history of other infectious and parasitic diseases: Secondary | ICD-10-CM | POA: Diagnosis not present

## 2018-02-06 DIAGNOSIS — E785 Hyperlipidemia, unspecified: Secondary | ICD-10-CM | POA: Diagnosis not present

## 2018-02-06 DIAGNOSIS — I779 Disorder of arteries and arterioles, unspecified: Secondary | ICD-10-CM | POA: Diagnosis not present

## 2018-02-06 DIAGNOSIS — M8588 Other specified disorders of bone density and structure, other site: Secondary | ICD-10-CM | POA: Diagnosis not present

## 2018-02-06 DIAGNOSIS — I10 Essential (primary) hypertension: Secondary | ICD-10-CM | POA: Diagnosis not present

## 2018-02-06 DIAGNOSIS — M150 Primary generalized (osteo)arthritis: Secondary | ICD-10-CM | POA: Diagnosis not present

## 2018-02-06 DIAGNOSIS — Z0000 Encounter for general adult medical examination without abnormal findings: Secondary | ICD-10-CM | POA: Diagnosis not present

## 2018-02-10 ENCOUNTER — Ambulatory Visit (INDEPENDENT_AMBULATORY_CARE_PROVIDER_SITE_OTHER): Payer: Medicare Other | Admitting: Sports Medicine

## 2018-02-10 ENCOUNTER — Encounter: Payer: Self-pay | Admitting: Sports Medicine

## 2018-02-10 DIAGNOSIS — M79671 Pain in right foot: Secondary | ICD-10-CM

## 2018-02-10 DIAGNOSIS — L608 Other nail disorders: Secondary | ICD-10-CM | POA: Diagnosis not present

## 2018-02-10 DIAGNOSIS — L603 Nail dystrophy: Secondary | ICD-10-CM | POA: Diagnosis not present

## 2018-02-10 DIAGNOSIS — L84 Corns and callosities: Secondary | ICD-10-CM

## 2018-02-10 DIAGNOSIS — M722 Plantar fascial fibromatosis: Secondary | ICD-10-CM

## 2018-02-10 DIAGNOSIS — B351 Tinea unguium: Secondary | ICD-10-CM

## 2018-02-10 DIAGNOSIS — M2142 Flat foot [pes planus] (acquired), left foot: Secondary | ICD-10-CM

## 2018-02-10 DIAGNOSIS — M2141 Flat foot [pes planus] (acquired), right foot: Secondary | ICD-10-CM

## 2018-02-10 DIAGNOSIS — M79672 Pain in left foot: Secondary | ICD-10-CM

## 2018-02-10 NOTE — Progress Notes (Signed)
Subjective: Barbara Rangel is a 72 y.o. female patient seen today in office with complaint of mildly painful thickened and discolored nails. Patient is desiring treatment for nail changes; has tried OTC topicals/Medication in the past with no improvement. Reports that nails are becoming difficult to manage because of the thickness and discoloration. Admits that she participates in pool/aquatics for her arthritis. Patient is also concerned with pain at bottom of right foot where she gets hard callus under her right foot. Patient states that her orthotics are 71 years old and is worn and desires a new set. Patient has no other pedal complaints at this time.   Patient Active Problem List   Diagnosis Date Noted  . Osteopenia determined by x-ray 08/28/2016  . History of right breast cancer 08/28/2016  . Malignant neoplasm of right female breast ([place]) 01/23/2016  . History of chemotherapy 01/23/2016  . High risk medication use 07/29/2015  . Screening mammogram for high-risk patient 07/29/2015  . Estrogen deficiency 07/29/2015  . 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/24/2015  . Dense breast tissue 01/24/2015  . Hx of adenomatous colonic polyps 01/24/2015  . Plantar fasciitis, bilateral 01/24/2015  . Breast cancer, right breast ([place]) 07/15/2013  . Syncope 09/15/2012  . Hypokalemia 09/15/2012  . Chronic back pain   . Chronic urinary tract infection   . Hypertension   . Arthritis   . Synovial cyst of lumbar facet joint 11/01/2011    Current Outpatient Medications on File Prior to Visit  Medication Sig Dispense Refill  . acetaminophen (TYLENOL) 500 MG tablet Take 1,000 mg by mouth at bedtime.    Marland Kitchen alendronate (FOSAMAX) 70 MG tablet Take 1 tablet (70 mg total) by mouth every 7 (seven) days. Hold while in hospital. Thursday.  Take with a full glass of water on an empty stomach. (Patient taking differently: Take 70 mg by mouth every Saturday. Take with a full glass of water on an empty stomach.) 4  tablet 2  . amLODipine (NORVASC) 5 MG tablet Take 5 mg by mouth at bedtime.     Marland Kitchen anastrozole (ARIMIDEX) 1 MG tablet TAKE 1 TABLET BY MOUTH  DAILY 90 tablet 3  . aspirin EC 81 MG tablet Take 81 mg by mouth every morning.     Marland Kitchen atorvastatin (LIPITOR) 20 MG tablet Take 1 tablet by mouth every morning.    Marland Kitchen azelastine (ASTELIN) 0.1 % nasal spray   0  . calcium citrate-vitamin D (CITRACAL+D) 315-200 MG-UNIT per tablet Take 2 tablets by mouth 2 (two) times daily.     . Cranberry Extract 250 MG TABS Take 1 capsule by mouth at bedtime.     . docusate sodium (COLACE) 100 MG capsule Take 100 mg by mouth 2 (two) times daily.      Marland Kitchen latanoprost (XALATAN) 0.005 % ophthalmic solution Place 1 drop into both eyes at bedtime.      Marland Kitchen levocetirizine (XYZAL) 5 MG tablet Take 5 mg by mouth daily.    Marland Kitchen LORazepam (ATIVAN) 0.5 MG tablet Take 0.5-1 mg by mouth 3 (three) times daily as needed. Reported on 03/14/2016  0  . Magnesium 250 MG TABS Take by mouth daily.    . Multiple Vitamins-Minerals (MULTIVITAMINS THER. W/MINERALS) TABS Take 1 tablet by mouth every morning.     Marland Kitchen omeprazole (PRILOSEC) 40 MG capsule Take 40 mg by mouth every morning.     . triamcinolone (NASACORT ALLERGY 24HR) 55 MCG/ACT AERO nasal inhaler Place 2 sprays into the nose as needed.  No current facility-administered medications on file prior to visit.     Allergies  Allergen Reactions  . Nitrous Oxide Other (See Comments)    Pt passed out and had confusion. Took a few hours to wake her up.  . Avelox [Moxifloxacin Hcl In Nacl] Other (See Comments)    Severe headache   . Cephalexin Other (See Comments)    Caused headache  . Other Other (See Comments)    Anesthesia, needs antiemetic med  . Quinolones Other (See Comments)    Headaches?  . Tramadol Nausea Only and Other (See Comments)    Nausea and Dizziness, too.  . Trimethoprim Other (See Comments)    Rapid heartbeat.  . Citalopram Hydrobromide Other (See Comments)    Pt can't  remember what the side effect was.    Objective: Physical Exam  General: Well developed, nourished, no acute distress, awake, alert and oriented x 3  Vascular: Dorsalis pedis artery 2/4 bilateral, Posterior tibial artery 1/4 bilateral, skin temperature warm to warm proximal to distal bilateral lower extremities, no varicosities, pedal hair present bilateral.  Neurological: Gross sensation present via light touch bilateral.   Dermatological: Skin is warm, dry, and supple bilateral, Nails 1-10 are tender, short thick, and discolored with mild subungal debris, no webspace macerations present bilateral, no open lesions present bilateral, + callus/hyperkeratotic tissue present sub met 5 on right. No signs of infection bilateral.  Musculoskeletal: Planus foot type/boney deformities noted bilateral. No pain to plantar fascia bilateral. Mild pain to right sub met 5. Muscular strength within normal limits without painon range of motion. No pain with calf compression bilateral.  Assessment and Plan:  Problem List Items Addressed This Visit      Musculoskeletal and Integument   Plantar fasciitis, bilateral    Other Visit Diagnoses    Nail fungus    -  Primary   Relevant Orders   Culture, fungus without smear   Pre-ulcerative corn or callous       Pes planus of both feet       Foot pain, bilateral          -Examined patient -Discussed treatment options for callus right foot -Mechanically debrided callus right foot x 1 using sterile chisel blade without incident -Applied salinocaine and bandaid may remove on tomorrow and encouraged pomice stone and skin emollients -Patient to be casted for a new set of orthotics with care to offload sub met 5, patient is aware that she may have to pay full cost for the orthotics -Discussed treatment options for painful dystrophic nails  -Fungal culture was obtained by removing a portion of the hard nail itself from each of the involved toenails using a  sterile nail nipper and sent to [facility] lab. Patient tolerated the biopsy procedure well without discomfort or need for anesthesia.  -Patient to return in 4 weeks for follow up evaluation and discussion of fungal culture results or sooner if symptoms worsen.  Landis Martins, DPM

## 2018-02-18 ENCOUNTER — Telehealth: Payer: Self-pay | Admitting: Sports Medicine

## 2018-02-18 NOTE — Telephone Encounter (Signed)
Left message for pt to call to schedule an appt to see [facility] for orthotics.

## 2018-02-19 NOTE — Telephone Encounter (Signed)
Pt scheduled for 4.16.19

## 2018-02-22 NOTE — Assessment & Plan Note (Signed)
Stage IIIa right breast cancer T2 N2 M0 diagnosed in June 2005 in [place] treated with mastectomy followed by adjuvant chemotherapy. 7 on 9 lymph nodes were apparently involved., Did not get adjuvant radiation (Unknown reasons). Current treatment: Arimidex 1 mg daily started January 2006  Arimidex toxicities: Denies any hot flashes or myalgias. 1. Bone density revealed osteopenia: Patient is on weightbearing exercise program last bone density March 2018 showed a T score of -1.7 We discussed at length about duration of antiestrogen therapy. There is no data for risks or benefits beyond 10 years of antiestrogen therapy. However patient wishes to continue with the same treatment given the high risk to begin with.  Surveillance: Mammogram 01/12/2018: No evidence of malignancy, breast density category C Breast exam 02/23/2018: Right mastectomy and no palpable abnormalities in the left breast. Return to clinic once a year for follow-up

## 2018-02-23 ENCOUNTER — Inpatient Hospital Stay: Payer: Medicare Other | Attending: Adult Health | Admitting: Hematology and Oncology

## 2018-02-23 ENCOUNTER — Other Ambulatory Visit: Payer: Medicare Other

## 2018-02-23 ENCOUNTER — Telehealth: Payer: Self-pay | Admitting: Hematology and Oncology

## 2018-02-23 DIAGNOSIS — Z170 Estrogen receptor positive status [ER+]: Secondary | ICD-10-CM | POA: Diagnosis not present

## 2018-02-23 DIAGNOSIS — C50011 Malignant neoplasm of nipple and areola, right female breast: Secondary | ICD-10-CM

## 2018-02-23 DIAGNOSIS — M8580 Other specified disorders of bone density and structure, unspecified site: Secondary | ICD-10-CM | POA: Diagnosis not present

## 2018-02-23 DIAGNOSIS — Z79899 Other long term (current) drug therapy: Secondary | ICD-10-CM | POA: Diagnosis not present

## 2018-02-23 DIAGNOSIS — Z9011 Acquired absence of right breast and nipple: Secondary | ICD-10-CM | POA: Diagnosis not present

## 2018-02-23 DIAGNOSIS — Z9221 Personal history of antineoplastic chemotherapy: Secondary | ICD-10-CM

## 2018-02-23 DIAGNOSIS — Z79811 Long term (current) use of aromatase inhibitors: Secondary | ICD-10-CM

## 2018-02-23 DIAGNOSIS — Z7982 Long term (current) use of aspirin: Secondary | ICD-10-CM | POA: Diagnosis not present

## 2018-02-23 DIAGNOSIS — C50911 Malignant neoplasm of unspecified site of right female breast: Secondary | ICD-10-CM

## 2018-02-23 MED ORDER — ANASTROZOLE 1 MG PO TABS: 1.0000 mg | ORAL_TABLET | Freq: Every day | ORAL | 3 refills | Status: DC

## 2018-02-23 NOTE — Telephone Encounter (Signed)
Gave avs and calendar ° °

## 2018-02-23 NOTE — Progress Notes (Signed)
Patient Care Team: Leeroy Cha, MD as PCP - General (Internal Medicine) Nicholas Lose, MD as Consulting Physician (Hematology and Oncology)  DIAGNOSIS:  Encounter Diagnoses  Name Primary?  . 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, unspecified estrogen receptor status ([place])   . Malignant neoplasm of right female breast, unspecified estrogen receptor status, unspecified site of breast ([place])     SUMMARY OF ONCOLOGIC HISTORY:   Breast cancer, right breast ([place])   01/10/2004 Initial Diagnosis    Patient found right breast cancer on breast self exam after negative mammogram same year, diagnosis March 2005 in [place]      04/26/2004 Surgery    Right mastectomy: T2 N2a M0 ER/PR positive HER-2 negative stage IIIa; 9 lymph nodes were involved (did not get radiation)      05/18/2004 - 09/21/2004 Chemotherapy    NSABP B 28 clinical trial with dose dense Adriamycin and Cytoxan followed by Taxol       11/12/2004 -  Anti-estrogen oral therapy    Arimidex 1 mg daily       CHIEF COMPLIANT: Follow-up on anastrozole therapy  INTERVAL HISTORY: Barbara Rangel is a 72 year old with above-mentioned history of right breast cancer treated with mastectomy and adjuvant chemotherapy.  She is currently on Arimidex therapy since 2006.  She has been on this for the past 13 years.  She is 30 problems tolerating the treatment.  She denies any lumps or nodules in the breast.  She does not wish to stop antiestrogen therapy even though there is no data for extension extended and duration of hormone therapy.  She is thinking of taking it for 1 more year and then stopping it next year.  She is also been on Fosamax  REVIEW OF SYSTEMS:   Constitutional: Denies fevers, chills or abnormal weight loss Eyes: Denies blurriness of vision Ears, nose, mouth, throat, and face: Denies mucositis or sore throat Respiratory: Denies cough, dyspnea or  wheezes Cardiovascular: Denies palpitation, chest discomfort Gastrointestinal:  Denies nausea, heartburn or change in bowel habits Skin: Denies abnormal skin rashes Lymphatics: Denies new lymphadenopathy or easy bruising Neurological:Denies numbness, tingling or new weaknesses Behavioral/Psych: Mood is stable, no new changes  Extremities: No lower extremity edema Breast:  denies any pain or lumps or nodules in either breasts All other systems were reviewed with the patient and are negative.  I have reviewed the past medical history, past surgical history, social history and family history with the patient and they are unchanged from previous note.  ALLERGIES:  is allergic to nitrous oxide; avelox [moxifloxacin hcl in nacl]; cephalexin; other; quinolones; tramadol; trimethoprim; and citalopram hydrobromide.  MEDICATIONS:  Current Outpatient Medications  Medication Sig Dispense Refill  . acetaminophen (TYLENOL) 500 MG tablet Take 1,000 mg by mouth at bedtime.    Marland Kitchen amLODipine (NORVASC) 5 MG tablet Take 5 mg by mouth at bedtime.     Marland Kitchen anastrozole (ARIMIDEX) 1 MG tablet Take 1 tablet (1 mg total) by mouth daily. 90 tablet 3  . aspirin EC 81 MG tablet Take 81 mg by mouth every morning.     Marland Kitchen atorvastatin (LIPITOR) 20 MG tablet Take 1 tablet by mouth every morning.    Marland Kitchen azelastine (ASTELIN) 0.1 % nasal spray   0  . calcium citrate-vitamin D (CITRACAL+D) 315-200 MG-UNIT per tablet Take 2 tablets by mouth 2 (two) times daily.     . Cranberry Extract 250 MG TABS Take 1 capsule by mouth at bedtime.     Marland Kitchen  docusate sodium (COLACE) 100 MG capsule Take 100 mg by mouth 2 (two) times daily.      Marland Kitchen latanoprost (XALATAN) 0.005 % ophthalmic solution Place 1 drop into both eyes at bedtime.      Marland Kitchen levocetirizine (XYZAL) 5 MG tablet Take 5 mg by mouth daily.    Marland Kitchen LORazepam (ATIVAN) 0.5 MG tablet Take 0.5-1 mg by mouth 3 (three) times daily as needed. Reported on 03/14/2016  0  . Magnesium 250 MG TABS Take by  mouth daily.    . Multiple Vitamins-Minerals (MULTIVITAMINS THER. W/MINERALS) TABS Take 1 tablet by mouth every morning.     Marland Kitchen omeprazole (PRILOSEC) 40 MG capsule Take 40 mg by mouth every morning.     . triamcinolone (NASACORT ALLERGY 24HR) 55 MCG/ACT AERO nasal inhaler Place 2 sprays into the nose as needed.      No current facility-administered medications for this visit.     PHYSICAL EXAMINATION: ECOG PERFORMANCE STATUS: 1 - Symptomatic but completely ambulatory  Vitals:   02/23/18 1004  BP: 126/60  Pulse: 76  Resp: 17  Temp: 98 F (36.7 C)  SpO2: 100%   Filed Weights   02/23/18 1004  Weight: 133 lb 6.4 oz (60.5 kg)    GENERAL:alert, no distress and comfortable SKIN: skin color, texture, turgor are normal, no rashes or significant lesions EYES: normal, Conjunctiva are pink and non-injected, sclera clear OROPHARYNX:no exudate, no erythema and lips, buccal mucosa, and tongue normal  NECK: supple, thyroid normal size, non-tender, without nodularity LYMPH:  no palpable lymphadenopathy in the cervical, axillary or inguinal LUNGS: clear to auscultation and percussion with normal breathing effort HEART: regular rate & rhythm and no murmurs and no lower extremity edema ABDOMEN:abdomen soft, non-tender and normal bowel sounds MUSCULOSKELETAL:no cyanosis of digits and no clubbing  NEURO: alert & oriented x 3 with fluent speech, no focal motor/sensory deficits EXTREMITIES: No lower extremity edema BREAST: No palpable masses or nodules in either right or left breasts. No palpable axillary supraclavicular or infraclavicular adenopathy no breast tenderness or nipple discharge. (exam performed in the presence of a chaperone)  LABORATORY DATA:  I have reviewed the data as listed CMP Latest Ref Rng & Units 02/24/2017 08/26/2016 03/01/2016  Glucose 70 - 140 mg/dl 82 95 96  BUN 7.0 - 26.0 mg/dL 14.4 15.1 7  Creatinine 0.6 - 1.1 mg/dL 0.8 0.8 0.71  Sodium 136 - 145 mEq/L 143 141 143   Potassium 3.5 - 5.1 mEq/L 4.0 4.1 3.8  Chloride 101 - 111 mmol/L - - 105  CO2 22 - 29 mEq/L '28 26 27  '$ Calcium 8.4 - 10.4 mg/dL 10.4 10.5(H) 10.1  Total Protein 6.4 - 8.3 g/dL 7.0 7.9 -  Total Bilirubin 0.20 - 1.20 mg/dL 0.60 0.63 -  Alkaline Phos 40 - 150 U/L 60 55 -  AST 5 - 34 U/L 27 27 -  ALT 0 - 55 U/L 26 26 -    Lab Results  Component Value Date   WBC 5.4 02/24/2017   HGB 14.3 02/24/2017   HCT 42.2 02/24/2017   MCV 95.7 02/24/2017   PLT 190 02/24/2017   NEUTROABS 3.0 02/24/2017    ASSESSMENT & PLAN:  Breast cancer, right breast Stage IIIa right breast cancer T2 N2 M0 diagnosed in June 2005 in [place] treated with mastectomy followed by adjuvant chemotherapy. 7 on 9 lymph nodes were apparently involved., Did not get adjuvant radiation (Unknown reasons). Current treatment: Arimidex 1 mg daily started January 2006  Arimidex toxicities: Denies  any hot flashes or myalgias. 1. Bone density revealed osteopenia: Patient is on weightbearing exercise program last bone density March 2019 showed a T score of -1.4.   I discussed with her about stopping Fosamax especially because of data suggesting that long-term bisphosphonate therapy can actually increase the risk of fractures.    We discussed at length about duration of antiestrogen therapy. There is no data for risks or benefits beyond 10 years of antiestrogen therapy.  Patient wishes to take it for 1 more year and then stop it.    Surveillance: Mammogram 01/12/2018: No evidence of malignancy, breast density category C Breast exam 02/23/2018: Right mastectomy and no palpable abnormalities in the left breast. Return to clinic once a year for follow-up  No orders of the defined types were placed in this encounter.  The patient has a good understanding of the overall plan. she agrees with it. she will call with any problems that may develop before the next visit here.   Harriette Ohara, MD 02/23/18

## 2018-02-24 ENCOUNTER — Ambulatory Visit: Payer: Medicare Other | Admitting: Orthotics

## 2018-02-24 DIAGNOSIS — M722 Plantar fascial fibromatosis: Secondary | ICD-10-CM

## 2018-02-24 NOTE — Progress Notes (Signed)
Refurbish with following modifications: Add scaphoid pad 1/16, p-cell, off load 5th met head.

## 2018-03-17 ENCOUNTER — Ambulatory Visit (INDEPENDENT_AMBULATORY_CARE_PROVIDER_SITE_OTHER): Payer: Medicare Other | Admitting: Sports Medicine

## 2018-03-17 ENCOUNTER — Encounter: Payer: Self-pay | Admitting: Sports Medicine

## 2018-03-17 DIAGNOSIS — B351 Tinea unguium: Secondary | ICD-10-CM | POA: Diagnosis not present

## 2018-03-17 NOTE — Progress Notes (Signed)
Subjective: Barbara Rangel is a 72 y.o. female patient seen today in office for fungal culture results. Patient has no other pedal complaints at this time.   Patient Active Problem List   Diagnosis Date Noted  . Osteopenia determined by x-ray 08/28/2016  . History of right breast cancer 08/28/2016  . Malignant neoplasm of right female breast ([place]) 01/23/2016  . History of chemotherapy 01/23/2016  . High risk medication use 07/29/2015  . Screening mammogram for high-risk patient 07/29/2015  . Estrogen deficiency 07/29/2015  . 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/24/2015  . Dense breast tissue 01/24/2015  . Hx of adenomatous colonic polyps 01/24/2015  . Plantar fasciitis, bilateral 01/24/2015  . Breast cancer, right breast ([place]) 07/15/2013  . Syncope 09/15/2012  . Hypokalemia 09/15/2012  . Chronic back pain   . Chronic urinary tract infection   . Hypertension   . Arthritis   . Synovial cyst of lumbar facet joint 11/01/2011    Current Outpatient Medications on File Prior to Visit  Medication Sig Dispense Refill  . acetaminophen (TYLENOL) 500 MG tablet Take 1,000 mg by mouth at bedtime.    Marland Kitchen amLODipine (NORVASC) 5 MG tablet Take 5 mg by mouth at bedtime.     Marland Kitchen anastrozole (ARIMIDEX) 1 MG tablet Take 1 tablet (1 mg total) by mouth daily. 90 tablet 3  . aspirin EC 81 MG tablet Take 81 mg by mouth every morning.     Marland Kitchen atorvastatin (LIPITOR) 20 MG tablet Take 1 tablet by mouth every morning.    Marland Kitchen azelastine (ASTELIN) 0.1 % nasal spray   0  . calcium citrate-vitamin D (CITRACAL+D) 315-200 MG-UNIT per tablet Take 2 tablets by mouth 2 (two) times daily.     . Cranberry Extract 250 MG TABS Take 1 capsule by mouth at bedtime.     . docusate sodium (COLACE) 100 MG capsule Take 100 mg by mouth 2 (two) times daily.      Marland Kitchen latanoprost (XALATAN) 0.005 % ophthalmic solution Place 1 drop into both eyes at bedtime.      Marland Kitchen levocetirizine (XYZAL) 5 MG tablet Take 5 mg by mouth daily.    Marland Kitchen  LORazepam (ATIVAN) 0.5 MG tablet Take 0.5-1 mg by mouth 3 (three) times daily as needed. Reported on 03/14/2016  0  . Magnesium 250 MG TABS Take by mouth daily.    . Multiple Vitamins-Minerals (MULTIVITAMINS THER. W/MINERALS) TABS Take 1 tablet by mouth every morning.     Marland Kitchen omeprazole (PRILOSEC) 40 MG capsule Take 40 mg by mouth every morning.     . triamcinolone (NASACORT ALLERGY 24HR) 55 MCG/ACT AERO nasal inhaler Place 2 sprays into the nose as needed.      No current facility-administered medications on file prior to visit.     Allergies  Allergen Reactions  . Nitrous Oxide Other (See Comments)    Pt passed out and had confusion. Took a few hours to wake her up.  . Avelox [Moxifloxacin Hcl In Nacl] Other (See Comments)    Severe headache   . Cephalexin Other (See Comments)    Caused headache  . Other Other (See Comments)    Anesthesia, needs antiemetic med  . Quinolones Other (See Comments)    Headaches?  . Tramadol Nausea Only and Other (See Comments)    Nausea and Dizziness, too.  . Trimethoprim Other (See Comments)    Rapid heartbeat.  . Citalopram Hydrobromide Other (See Comments)    Pt can't remember what the side effect  was.    Objective: Physical Exam  General: Well developed, nourished, no acute distress, awake, alert and oriented x 3  Vascular: Dorsalis pedis artery 2/4 bilateral, Posterior tibial artery 1/4 bilateral, skin temperature warm to warm proximal to distal bilateral lower extremities, + varicosities, pedal hair present bilateral.  Neurological: Gross sensation present via light touch bilateral.   Dermatological: Skin is warm, dry, and supple bilateral, Nails 1-10 are tender, short thick, and discolored with mild subungal debris, no webspace macerations present bilateral, no open lesions present bilateral, + callus/hyperkeratotic tissue present sub met 5 on right. No signs of infection bilateral.  Musculoskeletal: Bunion and Planus foot type/boney  deformities noted bilateral. No pain to plantar fascia bilateral. Mild pain to right sub met 5. Muscular strength within normal limits without painon range of motion. No pain with calf compression bilateral.   Fungal culture Negative supportive of trauma vs previous treated fungus   Assessment and Plan:  Problem List Items Addressed This Visit    None    Visit Diagnoses    Nail fungus    -  Primary      -Examined patient -Discussed treatment options for painful dystrophic nails -Recommend tea tree oil and vinegar soaks in the presence of negative culture -Recommend good supportive shoes daily and urea cream or revitaderm for callus on right  -Advised good hygiene habits -Patient to return PRN or sooner if symptoms worsen.  Landis Martins, DPM

## 2018-03-18 DIAGNOSIS — M542 Cervicalgia: Secondary | ICD-10-CM | POA: Diagnosis not present

## 2018-03-18 DIAGNOSIS — M5136 Other intervertebral disc degeneration, lumbar region: Secondary | ICD-10-CM | POA: Diagnosis not present

## 2018-03-23 ENCOUNTER — Ambulatory Visit: Payer: Medicare Other | Admitting: Orthotics

## 2018-03-23 DIAGNOSIS — M79676 Pain in unspecified toe(s): Secondary | ICD-10-CM

## 2018-03-23 DIAGNOSIS — M722 Plantar fascial fibromatosis: Secondary | ICD-10-CM

## 2018-03-23 NOTE — Progress Notes (Signed)
Patient p/u refurbished f/o with additons: (5th met head cutout); she seemed pleased.

## 2018-03-30 DIAGNOSIS — M542 Cervicalgia: Secondary | ICD-10-CM | POA: Diagnosis not present

## 2018-03-30 DIAGNOSIS — M545 Low back pain: Secondary | ICD-10-CM | POA: Diagnosis not present

## 2018-04-01 DIAGNOSIS — M545 Low back pain: Secondary | ICD-10-CM | POA: Diagnosis not present

## 2018-04-01 DIAGNOSIS — M542 Cervicalgia: Secondary | ICD-10-CM | POA: Diagnosis not present

## 2018-04-08 DIAGNOSIS — M542 Cervicalgia: Secondary | ICD-10-CM | POA: Diagnosis not present

## 2018-04-08 DIAGNOSIS — M545 Low back pain: Secondary | ICD-10-CM | POA: Diagnosis not present

## 2018-04-10 DIAGNOSIS — M545 Low back pain: Secondary | ICD-10-CM | POA: Diagnosis not present

## 2018-04-10 DIAGNOSIS — M542 Cervicalgia: Secondary | ICD-10-CM | POA: Diagnosis not present

## 2018-04-15 DIAGNOSIS — M542 Cervicalgia: Secondary | ICD-10-CM | POA: Diagnosis not present

## 2018-04-15 DIAGNOSIS — M545 Low back pain: Secondary | ICD-10-CM | POA: Diagnosis not present

## 2018-04-17 DIAGNOSIS — M545 Low back pain: Secondary | ICD-10-CM | POA: Diagnosis not present

## 2018-04-17 DIAGNOSIS — M542 Cervicalgia: Secondary | ICD-10-CM | POA: Diagnosis not present

## 2018-04-22 DIAGNOSIS — M545 Low back pain: Secondary | ICD-10-CM | POA: Diagnosis not present

## 2018-04-22 DIAGNOSIS — M542 Cervicalgia: Secondary | ICD-10-CM | POA: Diagnosis not present

## 2018-04-24 DIAGNOSIS — M545 Low back pain: Secondary | ICD-10-CM | POA: Diagnosis not present

## 2018-04-24 DIAGNOSIS — M542 Cervicalgia: Secondary | ICD-10-CM | POA: Diagnosis not present

## 2018-04-29 DIAGNOSIS — M545 Low back pain: Secondary | ICD-10-CM | POA: Diagnosis not present

## 2018-04-29 DIAGNOSIS — M542 Cervicalgia: Secondary | ICD-10-CM | POA: Diagnosis not present

## 2018-05-01 DIAGNOSIS — M545 Low back pain: Secondary | ICD-10-CM | POA: Diagnosis not present

## 2018-05-01 DIAGNOSIS — M542 Cervicalgia: Secondary | ICD-10-CM | POA: Diagnosis not present

## 2018-05-04 DIAGNOSIS — H401131 Primary open-angle glaucoma, bilateral, mild stage: Secondary | ICD-10-CM | POA: Diagnosis not present

## 2018-05-25 ENCOUNTER — Other Ambulatory Visit: Payer: Self-pay

## 2018-05-25 DIAGNOSIS — I6523 Occlusion and stenosis of bilateral carotid arteries: Secondary | ICD-10-CM

## 2018-07-06 ENCOUNTER — Encounter: Payer: Self-pay | Admitting: Family

## 2018-07-06 ENCOUNTER — Ambulatory Visit (INDEPENDENT_AMBULATORY_CARE_PROVIDER_SITE_OTHER): Payer: Medicare Other | Admitting: Family

## 2018-07-06 ENCOUNTER — Ambulatory Visit (HOSPITAL_COMMUNITY)
Admission: RE | Admit: 2018-07-06 | Discharge: 2018-07-06 | Disposition: A | Discharge status: Home / Self Care | Payer: Medicare Other | Source: Ambulatory Visit | Attending: Family | Admitting: Family

## 2018-07-06 VITALS — BP 138/78 | HR 72 | Temp 97.3°F | Resp 18 | Ht 65.0 in | Wt 132.0 lb

## 2018-07-06 DIAGNOSIS — I6523 Occlusion and stenosis of bilateral carotid arteries: Secondary | ICD-10-CM | POA: Diagnosis not present

## 2018-07-06 DIAGNOSIS — I781 Nevus, non-neoplastic: Secondary | ICD-10-CM | POA: Diagnosis not present

## 2018-07-06 NOTE — Progress Notes (Signed)
Chief Complaint: Follow up Extracranial Carotid Artery Stenosis   History of Present Illness  MARIETTA SIKKEMA is a 72 y.o. female who had a  carotid duplex and an outlying center suggesting high-grade left carotid stenosis. We repeated her duplex that time and did not see this level of stenosis and recommended follow-up only.   She is here today for repeat carotid duplex. She specifically denies any symptoms of amaurosis fugax, aphasia or transient ischemic attack or stroke. She does have a occasional headaches. Denies any new cardiac difficulty.  Dr. Donnetta Hutching last evaluated pt on 06-24-17. At that time he explained that that she apparently did have an over estimation of her degree of left carotid stenosis at an outlying lab. She was to continue her usual activities. She was to return in a year for repeat ultrasound. She was advised to present immediately to the emergency room should she develop any neurologic deficits.  She has a hx of a right mastectomy with lymph node dissection.   She denies claudication type symptoms with walking.   Diabetic: no Tobacco use: non-smoker  Pt meds include: Statin : yes ASA: yes Other anticoagulants/antiplatelets: no   Past Medical History:  Diagnosis Date  . Arthritis    degenerative arthritis  . Cancer ([place])    hx breast cancer-right  . Chronic back pain    right radicular leg pain  . Chronic urinary tract infection    takes Macrodantin and Cranberry daily  . Constipation    r/t pain meds and takes Dulcolax nightly  . FHx: colonic polyps    hx of and mother had colon cancer  . Glaucoma    uses Xalantan nightly  . Hypertension    takes Amlodipine daily  . Other and unspecified general anesthetics causing adverse effect in therapeutic use    hard to wake up  . PONV (postoperative nausea and vomiting)     Social History Social History   Tobacco Use  . Smoking status: Never Smoker  . Smokeless tobacco: Never Used  Substance Use  Topics  . Alcohol use: No  . Drug use: No    Family History Family History  Problem Relation Age of Onset  . Colon cancer Mother   . Breast cancer Cousin        on dads side  . Breast cancer Cousin        on moms side  . Anesthesia problems Neg Hx   . Hypotension Neg Hx   . Malignant hyperthermia Neg Hx   . Pseudochol deficiency Neg Hx     Surgical History Past Surgical History:  Procedure Laterality Date  . ANKLE SURGERY  2011   left ankle with screws and plates  . BREAST LUMPECTOMY     x2  . cataract surgery  2005   right eye  . COLONOSCOPY    . COLONOSCOPY WITH PROPOFOL N/A 10/11/2014   Procedure: COLONOSCOPY WITH PROPOFOL;  Surgeon: Garlan Fair, MD;  Location: [facility];  Service: Endoscopy;  Laterality: N/A;  . EYE SURGERY  2004   right eye-detached retina  . left breast biopsy  2009  . LUMBAR LAMINECTOMY/DECOMPRESSION MICRODISCECTOMY  10/31/2011   Procedure: LUMBAR LAMINECTOMY/DECOMPRESSION MICRODISCECTOMY;  Surgeon: Dahlia Bailiff;  Location: [facility];  Service: Orthopedics;  Laterality: Right;  L4-5 Decompression with Right Facet Decompression   . MASTECTOMY  2005   right  d/t breast cancer;no sticks to right arm LYMPH NODES REMOVED.  Marland Kitchen RETINAL DETACHMENT SURGERY  2004  .  right breast biopsy  2005  . TUBAL LIGATION  1980    Allergies  Allergen Reactions  . Nitrous Oxide Other (See Comments)    Pt passed out and had confusion. Took a few hours to wake her up.  . Avelox [Moxifloxacin Hcl In Nacl] Other (See Comments)    Severe headache   . Cephalexin Other (See Comments)    Caused headache  . Other Other (See Comments)    Anesthesia, needs antiemetic med  . Quinolones Other (See Comments)    Headaches?  . Tramadol Nausea Only and Other (See Comments)    Nausea and Dizziness, too.  . Trimethoprim Other (See Comments)    Rapid heartbeat.  . Citalopram Hydrobromide Other (See Comments)    Pt can't remember what the side effect was.    Current  Outpatient Medications  Medication Sig Dispense Refill  . acetaminophen (TYLENOL) 500 MG tablet Take 1,000 mg by mouth at bedtime.    Marland Kitchen amLODipine (NORVASC) 5 MG tablet Take 5 mg by mouth at bedtime.     Marland Kitchen anastrozole (ARIMIDEX) 1 MG tablet Take 1 tablet (1 mg total) by mouth daily. 90 tablet 3  . aspirin EC 81 MG tablet Take 81 mg by mouth every morning.     Marland Kitchen atorvastatin (LIPITOR) 20 MG tablet Take 1 tablet by mouth every morning.    Marland Kitchen azelastine (ASTELIN) 0.1 % nasal spray   0  . calcium citrate-vitamin D (CITRACAL+D) 315-200 MG-UNIT per tablet Take 2 tablets by mouth 2 (two) times daily.     . Cranberry Extract 250 MG TABS Take 1 capsule by mouth at bedtime.     . docusate sodium (COLACE) 100 MG capsule Take 100 mg by mouth 2 (two) times daily.      Marland Kitchen latanoprost (XALATAN) 0.005 % ophthalmic solution Place 1 drop into both eyes at bedtime.      Marland Kitchen levocetirizine (XYZAL) 5 MG tablet Take 5 mg by mouth daily.    . Magnesium 250 MG TABS Take by mouth daily.    . Multiple Vitamins-Minerals (MULTIVITAMINS THER. W/MINERALS) TABS Take 1 tablet by mouth every morning.     Marland Kitchen omeprazole (PRILOSEC) 40 MG capsule Take 40 mg by mouth every morning.     . triamcinolone (NASACORT ALLERGY 24HR) 55 MCG/ACT AERO nasal inhaler Place 2 sprays into the nose as needed.     Marland Kitchen LORazepam (ATIVAN) 0.5 MG tablet Take 0.5-1 mg by mouth 3 (three) times daily as needed. Reported on 03/14/2016  0   No current facility-administered medications for this visit.     Review of Systems : See HPI for pertinent positives and negatives.  Physical Examination  Vitals:   07/06/18 1159  BP: 138/78  Pulse: 72  Resp: 18  Temp: (!) 97.3 F (36.3 C)  TempSrc: Oral  SpO2: 99%  Weight: 132 lb (59.9 kg)  Height: 5\' 5"  (1.651 m)   Body mass index is 21.97 kg/m.  General: WDWN slim female in NAD GAIT: normal Eyes: PERRLA HENT: No gross abnormalities.  Pulmonary:  Respirations are non-labored, good air movement in all  fields, CTAB, no rales, rhonchi, or wheezing. Cardiac: regular rhythm, no detected murmur.  VASCULAR EXAM Carotid Bruits Right Left   Negative Negative     Abdominal aortic pulse is not palpable. Radial pulses are 2+ palpable and equal.  LE Pulses Right Left       POPLITEAL  not palpable   not palpable       POSTERIOR TIBIAL  not palpable   not palpable        DORSALIS PEDIS      ANTERIOR TIBIAL 1+ palpable  1+ palpable     Gastrointestinal: soft, nontender, BS WNL, no r/g, no palpable masses. Musculoskeletal: no muscle atrophy/wasting. M/S 5/5 throughout, extremities without ischemic changes. Skin: No rashes, no ulcers, no cellulitis.  Several superficial spider veins noted both legs. Hemosiderin staining of both lower legs in a gaiter fashion.   Neurologic:  A&O X 3; appropriate affect, sensation is normal; speech is normal, CN 2-12 intact, pain and light touch intact in extremities, motor exam as listed above. Psychiatric: Normal thought content, mood appropriate to clinical situation.    Assessment: AGUSTINA WITZKE is a 72 y.o. female who has no history of stroke or TIA.  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   Fortunately she does not have DM, has never used tobacco, is slender, exercises most days of the week. She takes a daily statin and 81 mg ASA.    DATA Carotid Duplex (07-06-18): Right ICA: 40-59% stenosis Left ICA: 60-79% stenosis, high bifurcation  Bilateral vertebral artery flow is antegrade.  Bilateral subclavian artery waveforms are normal.  Increased stenosis of the left ICA compared to the exam on 06-24-17.     Plan: Follow-up in 6 months with Carotid Duplex scan.   I discussed in depth with the  patient the nature of atherosclerosis, and emphasized the importance of maximal medical management including strict control of blood pressure, blood glucose, and lipid levels, obtaining regular exercise, and continued cessation of smoking.  The patient is aware that without maximal medical management the underlying atherosclerotic disease process will progress, limiting the benefit of any interventions. The patient was given information about stroke prevention and what symptoms should prompt the patient to seek immediate medical care. Thank you for allowing [place] to participate in this patient's care.  Clemon Chambers, RN, MSN, FNP-C [facility] Office: [phone]  Clinic Physician: Idalia Needle  07/06/18 12:05 PM

## 2018-07-06 NOTE — Patient Instructions (Addendum)
Stroke Prevention Some health problems and behaviors may make it more likely for you to have a stroke. Below are ways to lessen your risk of having a stroke.  Be active for at least 30 minutes on most or all days.  Do not smoke. Try not to be around others who smoke.  Do not drink too much alcohol. ? Do not have more than 2 drinks a day if you are a man. ? Do not have more than 1 drink a day if you are a woman and are not pregnant.  Eat healthy foods, such as fruits and vegetables. If you were put on a specific diet, follow the diet as told.  Keep your cholesterol levels under control through diet and medicines. Look for foods that are low in saturated fat, trans fat, cholesterol, and are high in fiber.  If you have diabetes, follow all diet plans and take your medicine as told.  Ask your doctor if you need treatment to lower your blood pressure. If you have high blood pressure (hypertension), follow all diet plans and take your medicine as told by your doctor.  If you are 15-52 years old, have your blood pressure checked every 3-5 years. If you are age 46 or older, have your blood pressure checked every year.  Keep a healthy weight. Eat foods that are low in calories, salt, saturated fat, trans fat, and cholesterol.  Do not take drugs.  Avoid birth control pills, if this applies. Talk to your doctor about the risks of taking birth control pills.  Talk to your doctor if you have sleep problems (sleep apnea).  Take all medicine as told by your doctor. ? You may be told to take aspirin or blood thinner medicine. Take this medicine as told by your doctor. ? Understand your medicine instructions.  Make sure any other conditions you have are being taken care of.  Get help right away if:  You suddenly lose feeling (you feel numb) or have weakness in your face, arm, or leg.  Your face or eyelid hangs down to one side.  You suddenly feel confused.  You have trouble talking  (aphasia) or understanding what people are saying.  You suddenly have trouble seeing in one or both eyes.  You suddenly have trouble walking.  You are dizzy.  You lose your balance or your movements are clumsy (uncoordinated).  You suddenly have a very bad headache and you do not know the cause.  You have new chest pain.  Your heart feels like it is fluttering or skipping a beat (irregular heartbeat). Do not wait to see if the symptoms above go away. Get help right away. Call your local emergency services (911 in [place] Do not drive yourself to the hospital. This information is not intended to replace advice given to you by your health care provider. Make sure you discuss any questions you have with your health care provider. Document Released: 04/28/2012 Document Revised: 04/04/2016 Document Reviewed: 04/30/2013 Elsevier Interactive Patient Education  2018 [employer].    To measure for knee high compression hose: Measure the length of calf (from the crease of the knee to the bottom of the heel), largest circumference of calf, and ankle circumference first thing in the morning before your legs have a chance to swell.  Take these 3 measurements with you to obtain 20-30 mm mercury graduated knee high compression hose.  Put the stockings on in the morning, remove at bedtime.

## 2018-07-24 DIAGNOSIS — Z23 Encounter for immunization: Secondary | ICD-10-CM | POA: Diagnosis not present

## 2018-08-19 DIAGNOSIS — I10 Essential (primary) hypertension: Secondary | ICD-10-CM | POA: Diagnosis not present

## 2018-08-19 DIAGNOSIS — J301 Allergic rhinitis due to pollen: Secondary | ICD-10-CM | POA: Diagnosis not present

## 2018-08-19 DIAGNOSIS — E785 Hyperlipidemia, unspecified: Secondary | ICD-10-CM | POA: Diagnosis not present

## 2018-08-26 DIAGNOSIS — H401213 Low-tension glaucoma, right eye, severe stage: Secondary | ICD-10-CM | POA: Diagnosis not present

## 2018-09-15 ENCOUNTER — Other Ambulatory Visit: Payer: Self-pay | Admitting: Internal Medicine

## 2018-09-15 ENCOUNTER — Ambulatory Visit
Admission: RE | Admit: 2018-09-15 | Discharge: 2018-09-15 | Disposition: A | Discharge status: Home / Self Care | Payer: Medicare Other | Source: Ambulatory Visit | Attending: Internal Medicine | Admitting: Internal Medicine

## 2018-09-15 DIAGNOSIS — L659 Nonscarring hair loss, unspecified: Secondary | ICD-10-CM | POA: Diagnosis not present

## 2018-09-15 DIAGNOSIS — M25562 Pain in left knee: Secondary | ICD-10-CM

## 2018-10-02 DIAGNOSIS — Z124 Encounter for screening for malignant neoplasm of cervix: Secondary | ICD-10-CM | POA: Diagnosis not present

## 2018-10-02 DIAGNOSIS — Z01419 Encounter for gynecological examination (general) (routine) without abnormal findings: Secondary | ICD-10-CM | POA: Diagnosis not present

## 2018-10-03 DIAGNOSIS — H9201 Otalgia, right ear: Secondary | ICD-10-CM | POA: Diagnosis not present

## 2018-10-13 ENCOUNTER — Ambulatory Visit (INDEPENDENT_AMBULATORY_CARE_PROVIDER_SITE_OTHER): Payer: Medicare Other | Admitting: Sports Medicine

## 2018-10-13 ENCOUNTER — Encounter: Payer: Self-pay | Admitting: Sports Medicine

## 2018-10-13 DIAGNOSIS — M79675 Pain in left toe(s): Secondary | ICD-10-CM | POA: Diagnosis not present

## 2018-10-13 DIAGNOSIS — B351 Tinea unguium: Secondary | ICD-10-CM

## 2018-10-13 DIAGNOSIS — M79674 Pain in right toe(s): Secondary | ICD-10-CM | POA: Diagnosis not present

## 2018-10-13 DIAGNOSIS — L84 Corns and callosities: Secondary | ICD-10-CM | POA: Diagnosis not present

## 2018-10-13 NOTE — Patient Instructions (Signed)
Vinegar soaks 1 cup of white distilled vinegar to 8 cups of warm water.  Soak 20 mins. May repeat soak two times per week.  If there is thickness to nails may file nails after soaks or after bath/shower with nail file and apply tea tree oil. Apply oil daily to nails after filing for the best result.  

## 2018-10-13 NOTE — Progress Notes (Signed)
Subjective: Barbara Rangel is a 72 y.o. female patient seen today in office for continued concern for thickened nails discolored toenails and also admits to pain on the callus area on the bottom of her right foot.  Patient is desiring treatment for these changes however does admit that she has not tried tea tree oil or vinegar soaks because she forgot after last visit.  Patient reports that she uses insoles in her shoes but has not tried any additional padding or cushioning to help with the callus on the bottom of her right foot.  Patient reports that she uses a pumice stone occasionally but reports that she tends to get a lot of buildup coming back over the area.  Patient denies any changes in medication or health history since last visit.  Patient Active Problem List   Diagnosis Date Noted  . Osteopenia determined by x-ray 08/28/2016  . History of right breast cancer 08/28/2016  . Malignant neoplasm of right female breast ([place]) 01/23/2016  . History of chemotherapy 01/23/2016  . High risk medication use 07/29/2015  . Screening mammogram for high-risk patient 07/29/2015  . Estrogen deficiency 07/29/2015  . 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/24/2015  . Dense breast tissue 01/24/2015  . Hx of adenomatous colonic polyps 01/24/2015  . Plantar fasciitis, bilateral 01/24/2015  . Breast cancer, right breast ([place] ) 07/15/2013  . Syncope 09/15/2012  . Hypokalemia 09/15/2012  . Chronic back pain   . Chronic urinary tract infection   . Hypertension   . Arthritis   . Synovial cyst of lumbar facet joint 11/01/2011    Current Outpatient Medications on File Prior to Visit  Medication Sig Dispense Refill  . acetaminophen (TYLENOL) 500 MG tablet Take 1,000 mg by mouth at bedtime.    Marland Kitchen amLODipine (NORVASC) 5 MG tablet Take 5 mg by mouth at bedtime.     Marland Kitchen anastrozole (ARIMIDEX) 1 MG tablet Take 1 tablet (1 mg total) by mouth daily. 90 tablet 3  . aspirin EC 81 MG tablet Take 81 mg by mouth every  morning.     Marland Kitchen atorvastatin (LIPITOR) 20 MG tablet Take 1 tablet by mouth every morning.    Marland Kitchen azelastine (ASTELIN) 0.1 % nasal spray   0  . calcium citrate-vitamin D (CITRACAL+D) 315-200 MG-UNIT per tablet Take 2 tablets by mouth 2 (two) times daily.     . Cranberry Extract 250 MG TABS Take 1 capsule by mouth at bedtime.     . docusate sodium (COLACE) 100 MG capsule Take 100 mg by mouth 2 (two) times daily.      Marland Kitchen latanoprost (XALATAN) 0.005 % ophthalmic solution Place 1 drop into both eyes at bedtime.      Marland Kitchen levocetirizine (XYZAL) 5 MG tablet Take 5 mg by mouth daily.    Marland Kitchen LORazepam (ATIVAN) 0.5 MG tablet Take 0.5-1 mg by mouth 3 (three) times daily as needed. Reported on 03/14/2016  0  . Magnesium 250 MG TABS Take by mouth daily.    . Multiple Vitamins-Minerals (MULTIVITAMINS THER. W/MINERALS) TABS Take 1 tablet by mouth every morning.     Marland Kitchen omeprazole (PRILOSEC) 40 MG capsule Take 40 mg by mouth every morning.     . triamcinolone (NASACORT ALLERGY 24HR) 55 MCG/ACT AERO nasal inhaler Place 2 sprays into the nose as needed.      No current facility-administered medications on file prior to visit.     Allergies  Allergen Reactions  . Nitrous Oxide Other (See Comments)  Pt passed out and had confusion. Took a few hours to wake her up.  . Avelox [Moxifloxacin Hcl In Nacl] Other (See Comments)    Severe headache   . Cephalexin Other (See Comments)    Caused headache  . Other Other (See Comments)    Anesthesia, needs antiemetic med  . Quinolones Other (See Comments)    Headaches?  . Tramadol Nausea Only and Other (See Comments)    Nausea and Dizziness, too.  . Trimethoprim Other (See Comments)    Rapid heartbeat.  . Citalopram Hydrobromide Other (See Comments)    Pt can't remember what the side effect was.    Objective: Physical Exam  General: Well developed, nourished, no acute distress, awake, alert and oriented x 3  Vascular: Dorsalis pedis artery 2/4 bilateral, Posterior  tibial artery 1/4 bilateral, skin temperature warm to warm proximal to distal bilateral lower extremities, + varicosities, pedal hair present bilateral.  Neurological: Gross sensation present via light touch bilateral.   Dermatological: Skin is warm, dry, and supple bilateral, Nails 1-10 are tender, mildly elongated, thick, and discolored with mild subungal debris, no webspace macerations present bilateral, no open lesions present bilateral, + callus/hyperkeratotic tissue present sub met 5 on right. No signs of infection bilateral.  Musculoskeletal: Bunion and Planus foot type/boney deformities noted bilateral. No pain to plantar fascia bilateral. Mild pain to right sub met 5. Muscular strength within normal limits without painon range of motion. No pain with calf compression bilateral.   Assessment and Plan:  Problem List Items Addressed This Visit    None    Visit Diagnoses    Pre-ulcerative corn or callous    -  Primary   Pain due to onychomycosis of toenails of both feet         -Examined patient -Discussed treatment options for painful dystrophic nails and for callus on right foot -Mechanically debrided callus using a sterile chisel blade removing the hyperkeratotic epidermal layer to patient's comfort without incident and applied offloading padding and salioncaine sub-met 5 on right -Mechanically debrided nails x10 using a sterile nail nipper and smooth with bur and dremel without incident -Recommend patient to start tea tree oil and vinegar soaks in the presence of negative culture -Recommend good supportive shoes daily and urea cream or revitaderm for callus on right or use of pumice stone and to avoid sharp cutting tools and area of callus on right foot -Patient to return in 3 months for routine foot care or sooner if symptoms worsen.  Landis Martins, DPM

## 2018-11-13 ENCOUNTER — Other Ambulatory Visit: Payer: Self-pay | Admitting: *Deleted

## 2018-11-13 DIAGNOSIS — I6523 Occlusion and stenosis of bilateral carotid arteries: Secondary | ICD-10-CM

## 2018-11-23 ENCOUNTER — Telehealth: Payer: Self-pay

## 2018-11-23 DIAGNOSIS — C50011 Malignant neoplasm of nipple and areola, right female breast: Secondary | ICD-10-CM

## 2018-11-23 DIAGNOSIS — M8580 Other specified disorders of bone density and structure, unspecified site: Secondary | ICD-10-CM

## 2018-11-23 NOTE — Telephone Encounter (Signed)
Pt called to ensure mammogram and bone density scan orders are placed prior to follow up with physician.  Orders placed.  Patient will call [facility] to schedule mammogram and DEXA scan.  No further needs at this time.

## 2018-12-01 ENCOUNTER — Telehealth: Payer: Self-pay | Admitting: Sports Medicine

## 2018-12-01 NOTE — Telephone Encounter (Signed)
I need a summary of my appointments that was scheduled in 2018 and 2019. Please call me back at [phone].

## 2018-12-02 ENCOUNTER — Telehealth: Payer: Self-pay | Admitting: Hematology and Oncology

## 2018-12-02 NOTE — Telephone Encounter (Signed)
Printed medical records for patient to pick up, Release [id number]

## 2018-12-14 NOTE — Progress Notes (Signed)
HISTORY AND PHYSICAL     CC:  follow up. Requesting Provider:  Leeroy Cha,*  HPI: This is a 73 y.o. female here for follow up for carotid artery stenosis.  Pt was last seen 07/06/18 by NP.  She was originally seen due to a carotid duplex at another center with high grade left carotid artery stenosis.  The duplex was repeated and there was not a high grade stenosis on the repeat duplex.  The right was 40-59% and left was 60-79%.   She was seen back in August and she remained asx.    She returns today for follow up.  She denies any facial droop, speech difficulties, amaurosis fugax or hemiparesis.  She states that she does have some fuzziness over her eyes and with a few blinks, this clears.  She states she gets some muscle cramps in her legs at night and they resolve with mustard.    She states she has a place on her left ear that gets flaky and will somewhat come and go.  She states she has some discolored areas of her lower legs with mild swelling.  She states she does not have aching of her legs or heaviness of her legs.    The pt is on a statin for cholesterol management.  The pt is not diabetic.   The pt is on CCB for hypertension.   Tobacco hx:  Never smoked The pt is on a daily aspirin.  Pt meds includes: Statin:  Yes.   Beta Blocker:  No. Aspirin:  Yes.   ACEI:  No. ARB:  No. CCB use:  Yes Other Antiplatelet/Anticoagulant:  No   Past Medical History:  Diagnosis Date  . Arthritis    degenerative arthritis  . Cancer ([place])    hx breast cancer-right  . Chronic back pain    right radicular leg pain  . Chronic urinary tract infection    takes Macrodantin and Cranberry daily  . Constipation    r/t pain meds and takes Dulcolax nightly  . FHx: colonic polyps    hx of and mother had colon cancer  . Glaucoma    uses Xalantan nightly  . Hypertension    takes Amlodipine daily  . Other and unspecified general anesthetics causing adverse effect in therapeutic use      hard to wake up  . PONV (postoperative nausea and vomiting)     Past Surgical History:  Procedure Laterality Date  . ANKLE SURGERY  2011   left ankle with screws and plates  . BREAST LUMPECTOMY     x2  . cataract surgery  2005   right eye  . COLONOSCOPY    . COLONOSCOPY WITH PROPOFOL N/A 10/11/2014   Procedure: COLONOSCOPY WITH PROPOFOL;  Surgeon: Garlan Fair, MD;  Location: [facility];  Service: Endoscopy;  Laterality: N/A;  . EYE SURGERY  2004   right eye-detached retina  . left breast biopsy  2009  . LUMBAR LAMINECTOMY/DECOMPRESSION MICRODISCECTOMY  10/31/2011   Procedure: LUMBAR LAMINECTOMY/DECOMPRESSION MICRODISCECTOMY;  Surgeon: Dahlia Bailiff;  Location: [facility];  Service: Orthopedics;  Laterality: Right;  L4-5 Decompression with Right Facet Decompression   . MASTECTOMY  2005   right  d/t breast cancer;no sticks to right arm LYMPH NODES REMOVED.  Marland Kitchen RETINAL DETACHMENT SURGERY  2004  . right breast biopsy  2005  . TUBAL LIGATION  1980    Allergies  Allergen Reactions  . Nitrous Oxide Other (See Comments)    Pt passed out  and had confusion. Took a few hours to wake her up.  . Avelox [Moxifloxacin Hcl In Nacl] Other (See Comments)    Severe headache   . Cephalexin Other (See Comments)    Caused headache  . Other Other (See Comments)    Anesthesia, needs antiemetic med  . Quinolones Other (See Comments)    Headaches?  . Tramadol Nausea Only and Other (See Comments)    Nausea and Dizziness, too.  . Trimethoprim Other (See Comments)    Rapid heartbeat.  . Citalopram Hydrobromide Other (See Comments)    Pt can't remember what the side effect was.    Current Outpatient Medications  Medication Sig Dispense Refill  . acetaminophen (TYLENOL) 500 MG tablet Take 1,000 mg by mouth at bedtime.    Marland Kitchen amLODipine (NORVASC) 5 MG tablet Take 5 mg by mouth at bedtime.     Marland Kitchen anastrozole (ARIMIDEX) 1 MG tablet Take 1 tablet (1 mg total) by mouth daily. 90 tablet 3  .  aspirin EC 81 MG tablet Take 81 mg by mouth every morning.     Marland Kitchen atorvastatin (LIPITOR) 20 MG tablet Take 1 tablet by mouth every morning.    Marland Kitchen azelastine (ASTELIN) 0.1 % nasal spray   0  . calcium citrate-vitamin D (CITRACAL+D) 315-200 MG-UNIT per tablet Take 2 tablets by mouth 2 (two) times daily.     . Cranberry Extract 250 MG TABS Take 1 capsule by mouth at bedtime.     . docusate sodium (COLACE) 100 MG capsule Take 100 mg by mouth 2 (two) times daily.      Marland Kitchen latanoprost (XALATAN) 0.005 % ophthalmic solution Place 1 drop into both eyes at bedtime.      Marland Kitchen levocetirizine (XYZAL) 5 MG tablet Take 5 mg by mouth daily.    Marland Kitchen LORazepam (ATIVAN) 0.5 MG tablet Take 0.5-1 mg by mouth 3 (three) times daily as needed. Reported on 03/14/2016  0  . Magnesium 250 MG TABS Take by mouth daily.    . Multiple Vitamins-Minerals (MULTIVITAMINS THER. W/MINERALS) TABS Take 1 tablet by mouth every morning.     Marland Kitchen omeprazole (PRILOSEC) 40 MG capsule Take 40 mg by mouth every morning.     . triamcinolone (NASACORT ALLERGY 24HR) 55 MCG/ACT AERO nasal inhaler Place 2 sprays into the nose as needed.      No current facility-administered medications for this visit.     Family History  Problem Relation Age of Onset  . Colon cancer Mother   . Breast cancer Cousin        on dads side  . Breast cancer Cousin        on moms side  . Anesthesia problems Neg Hx   . Hypotension Neg Hx   . Malignant hyperthermia Neg Hx   . Pseudochol deficiency Neg Hx     Social History   Socioeconomic History  . Marital status: Married    Spouse name: Doren Custard  . Number of children: 4  . Years of education: BS  . Highest education level: Not on file  Occupational History  . Occupation: Retired  Scientific laboratory technician  . Financial resource strain: Not on file  . Food insecurity:    Worry: Not on file    Inability: Not on file  . Transportation needs:    Medical: Not on file    Non-medical: Not on file  Tobacco Use  . Smoking status:  Never Smoker  . Smokeless tobacco: Never Used  Substance and Sexual Activity  .  Alcohol use: No  . Drug use: No  . Sexual activity: Yes  Lifestyle  . Physical activity:    Days per week: Not on file    Minutes per session: Not on file  . Stress: Not on file  Relationships  . Social connections:    Talks on phone: Not on file    Gets together: Not on file    Attends religious service: Not on file    Active member of club or organization: Not on file    Attends meetings of clubs or organizations: Not on file    Relationship status: Not on file  . Intimate partner violence:    Fear of current or ex partner: Not on file    Emotionally abused: Not on file    Physically abused: Not on file    Forced sexual activity: Not on file  Other Topics Concern  . Not on file  Social History Narrative   Pt lives at home with spouse.   Caffeine Use: none     REVIEW OF SYSTEMS:   [X]  denotes positive finding, [ ]  denotes negative finding Cardiac  Comments:  Chest pain or chest pressure:    Shortness of breath upon exertion:    Short of breath when lying flat:    Irregular heart rhythm:        Vascular    Pain in calf, thigh, or hip brought on by ambulation: x See HPI  Pain in feet at night that wakes you up from your sleep:  x   Blood clot in your veins:    Leg swelling:  x       Pulmonary    Oxygen at home:    Productive cough:     Wheezing:         Neurologic    Sudden weakness in arms or legs:     Sudden numbness in arms or legs:     Sudden onset of difficulty speaking or slurred speech:    Temporary loss of vision in one eye:     Problems with dizziness:         Gastrointestinal    Blood in stool:     Vomited blood:         Genitourinary    Burning when urinating:     Blood in urine:        Psychiatric    Major depression:         Hematologic    Bleeding problems:    Problems with blood clotting too easily:        Skin    Rashes or ulcers:         Constitutional    Fever or chills:      PHYSICAL EXAMINATION:  Today's Vitals   12/15/18 1131  BP: (!) 142/77  Pulse: 73  Resp: 18  SpO2: 99%  Weight: 132 lb (59.9 kg)  Height: 5\' 5"  (1.651 m)   Body mass index is 21.97 kg/m.   General:  WDWN in NAD; vital signs documented above Gait: Not observed HENT: WNL, normocephalic Pulmonary: normal non-labored breathing , without Rales, rhonchi,  wheezing Cardiac: regular HR, without  Murmurs, rubs or gallops; without carotid bruits Abdomen: soft, NT, aorta is palpable Skin: without rashes; spider veins present BLE left>right; small shiny area on auricle of left ear with a darkened area around it. Vascular Exam/Pulses:  Right Left  Radial 2+ (normal) 2+ (normal)  Ulnar 1+ (weak) 1+ (weak)  Popliteal Unable  to palpate  Unable to palpate   DP 2+ (normal) 2+ (normal)  PT Unable to palpate  Unable to palpate    Extremities: without ischemic changes, without Gangrene , without cellulitis; without open wounds; mild edema BLE with hemosiderin deposits.  Musculoskeletal: no muscle wasting or atrophy  Neurologic: A&O X 3 Psychiatric:  The pt has Normal affect.   Non-Invasive Vascular Imaging:   Carotid Duplex on 12/15/2018: Right:  40-59% stenosis Left:  60-79% stenosis Vertebrals:  Bilateral vertebral arteries demonstrate antegrade flow. Subclavians: Normal flow hemodynamics were seen in bilateral subclavian arteries.  Previous Carotid duplex on 07/06/18: Right: 40-59% stenosis Left:   60-79% stenosis Non-hemodynamically significant plaque noted in the CCA. Multiple               focal stenoses noted in the mid segment of the internal carotid               artery which is suggestive of fibromuscular dysplasia. Vertebrals:  Bilateral vertebral arteries demonstrate antegrade flow. Subclavians: Normal flow hemodynamics were seen in bilateral subclavian arteries.   ASSESSMENT/PLAN:: 73 y.o. female here for follow up carotid  artery stenosis.   -pt's carotid duplex essentially unchanged.  She remains asymptomatic.  Will have her follow up in 6 months with carotid duplex.  Discussed s/s of stroke and she knows to proceed to ER should these develop.   -pt is thin and her aorta is palpable.  Dr. Donnetta Hutching also examined pt and feels it is not aneurysmal.  She does not have palpable popliteal pulses.  When she comes back in 6 months, will have the aorta medicare screening u/s.   -she does have a shiny area on her left ear concerning for basal cell carcinoma.  Will refer to dermatologist for evaluation.  -continue statin/asa   Leontine Locket, PA-C [facility] [phone]  Clinic MD:  Early

## 2018-12-15 ENCOUNTER — Encounter: Payer: Self-pay | Admitting: Family

## 2018-12-15 ENCOUNTER — Other Ambulatory Visit: Payer: Self-pay

## 2018-12-15 ENCOUNTER — Ambulatory Visit (HOSPITAL_COMMUNITY)
Admission: RE | Admit: 2018-12-15 | Discharge: 2018-12-15 | Disposition: A | Discharge status: Home / Self Care | Payer: Medicare Other | Source: Ambulatory Visit | Attending: Family | Admitting: Family

## 2018-12-15 ENCOUNTER — Ambulatory Visit (INDEPENDENT_AMBULATORY_CARE_PROVIDER_SITE_OTHER): Payer: Medicare Other | Admitting: Physician Assistant

## 2018-12-15 VITALS — BP 142/77 | HR 73 | Resp 18 | Ht 65.0 in | Wt 132.0 lb

## 2018-12-15 DIAGNOSIS — I6523 Occlusion and stenosis of bilateral carotid arteries: Secondary | ICD-10-CM

## 2018-12-23 DIAGNOSIS — H401131 Primary open-angle glaucoma, bilateral, mild stage: Secondary | ICD-10-CM | POA: Diagnosis not present

## 2019-01-08 DIAGNOSIS — J301 Allergic rhinitis due to pollen: Secondary | ICD-10-CM | POA: Diagnosis not present

## 2019-01-08 DIAGNOSIS — H61102 Unspecified noninfective disorders of pinna, left ear: Secondary | ICD-10-CM | POA: Diagnosis not present

## 2019-01-12 ENCOUNTER — Ambulatory Visit: Payer: Medicare Other | Admitting: Sports Medicine

## 2019-01-14 DIAGNOSIS — I868 Varicose veins of other specified sites: Secondary | ICD-10-CM | POA: Diagnosis not present

## 2019-01-14 DIAGNOSIS — L570 Actinic keratosis: Secondary | ICD-10-CM | POA: Diagnosis not present

## 2019-01-14 DIAGNOSIS — D485 Neoplasm of uncertain behavior of skin: Secondary | ICD-10-CM | POA: Diagnosis not present

## 2019-01-14 DIAGNOSIS — R234 Changes in skin texture: Secondary | ICD-10-CM | POA: Diagnosis not present

## 2019-01-19 ENCOUNTER — Encounter: Payer: Self-pay | Admitting: Sports Medicine

## 2019-01-19 ENCOUNTER — Ambulatory Visit (INDEPENDENT_AMBULATORY_CARE_PROVIDER_SITE_OTHER): Payer: Medicare Other | Admitting: Sports Medicine

## 2019-01-19 DIAGNOSIS — M79671 Pain in right foot: Secondary | ICD-10-CM

## 2019-01-19 DIAGNOSIS — Q828 Other specified congenital malformations of skin: Secondary | ICD-10-CM

## 2019-01-19 DIAGNOSIS — L989 Disorder of the skin and subcutaneous tissue, unspecified: Secondary | ICD-10-CM

## 2019-01-19 NOTE — Progress Notes (Signed)
Subjective: Barbara Rangel is a 73 y.o. female patient who presents to office for evaluation of right foot pain secondary to callus skin. Patient complains of pain at the lesion present sub-met 5 on right. Patient reports that callus trimmed helped a little however has pretty intense sharp pain at the plantar 1 on the bottom of her right foot. Patient denies any other pedal complaints.   Patient Active Problem List   Diagnosis Date Noted  . Neck pain 03/30/2018  . Osteopenia determined by x-ray 08/28/2016  . History of right breast cancer 08/28/2016  . Malignant neoplasm of right female breast ([place]) 01/23/2016  . History of chemotherapy 01/23/2016  . High risk medication use 07/29/2015  . Screening mammogram for high-risk patient 07/29/2015  . Estrogen deficiency 07/29/2015  . 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/24/2015  . Dense breast tissue 01/24/2015  . Hx of adenomatous colonic polyps 01/24/2015  . Plantar fasciitis, bilateral 01/24/2015  . Breast cancer, right breast ([place]) 07/15/2013  . Syncope 09/15/2012  . Hypokalemia 09/15/2012  . Chronic back pain   . Chronic urinary tract infection   . Hypertension   . Arthritis   . Synovial cyst of lumbar facet joint 11/01/2011    Current Outpatient Medications on File Prior to Visit  Medication Sig Dispense Refill  . acetaminophen (TYLENOL) 500 MG tablet Take 1,000 mg by mouth at bedtime.    Marland Kitchen amLODipine (NORVASC) 5 MG tablet Take 5 mg by mouth at bedtime.     Marland Kitchen anastrozole (ARIMIDEX) 1 MG tablet Take 1 tablet (1 mg total) by mouth daily. 90 tablet 3  . aspirin EC 81 MG tablet Take 81 mg by mouth every morning.     Marland Kitchen atorvastatin (LIPITOR) 20 MG tablet Take 1 tablet by mouth every morning.    Marland Kitchen azelastine (ASTELIN) 0.1 % nasal spray   0  . calcium citrate-vitamin D (CITRACAL+D) 315-200 MG-UNIT per tablet Take 2 tablets by mouth 2 (two) times daily.     . Cranberry Extract 250 MG TABS Take 1 capsule by mouth at bedtime.     .  docusate sodium (COLACE) 100 MG capsule Take 100 mg by mouth 2 (two) times daily.      . Famotidine (PEPCID PO) Take by mouth.    . latanoprost (XALATAN) 0.005 % ophthalmic solution Place 1 drop into both eyes at bedtime.      Marland Kitchen levocetirizine (XYZAL) 5 MG tablet Take 5 mg by mouth daily.    Marland Kitchen LORazepam (ATIVAN) 0.5 MG tablet Take 0.5-1 mg by mouth 3 (three) times daily as needed. Reported on 03/14/2016  0  . Magnesium 250 MG TABS Take by mouth daily.    . Multiple Vitamins-Minerals (MULTIVITAMINS THER. W/MINERALS) TABS Take 1 tablet by mouth every morning.     . mupirocin ointment (BACTROBAN) 2 % APP EXT AA TID FOR 5 DAYS    . omeprazole (PRILOSEC) 40 MG capsule Take 40 mg by mouth every morning.     . triamcinolone (NASACORT ALLERGY 24HR) 55 MCG/ACT AERO nasal inhaler Place 2 sprays into the nose as needed.      No current facility-administered medications on file prior to visit.     Allergies  Allergen Reactions  . Nitrous Oxide Other (See Comments)    Pt passed out and had confusion. Took a few hours to wake her up.  . Avelox [Moxifloxacin Hcl In Nacl] Other (See Comments)    Severe headache   . Cephalexin Other (See Comments)  Caused headache  . Other Other (See Comments)    Anesthesia, needs antiemetic med  . Quinolones Other (See Comments)    Headaches?  . Tramadol Nausea Only and Other (See Comments)    Nausea and Dizziness, too.  . Trimethoprim Other (See Comments)    Rapid heartbeat.  . Citalopram Hydrobromide Other (See Comments)    Pt can't remember what the side effect was.    Objective:  General: Alert and oriented x3 in no acute distress  Dermatology: Keratotic lesion present sub-met 5 on right with skin lines transversing the lesion, pain is present with direct pressure to the lesion with a central nucleated core noted, no webspace macerations, no ecchymosis bilateral, all nails x 10 are well manicured.  Vascular: Dorsalis Pedis and Posterior Tibial pedal  pulses 1/4, Capillary Fill Time 3 seconds, + pedal hair growth bilateral, no edema bilateral lower extremities, Temperature gradient within normal limits.  Neurology: Johney Maine sensation intact via light touch bilateral.  Musculoskeletal: Mild tenderness with palpation at the keratotic lesion site on Right, fat pad atrophy bunion and hammertoe deformity pes planus foot type with pain to right sub-met 5 at area of nucleation as noted above.  Muscular strength 5/5 in all groups without pain or limitation on range of motion.   Assessment and Plan: Problem List Items Addressed This Visit    None    Visit Diagnoses    Porokeratosis    -  Primary   Benign skin lesion       Right foot pain          -Complete examination performed -Discussed treatment options -ABN signed -Parred keratoic lesion using a chisel blade x1; treated the area withSalinocaine covered with offloading padding -Encouraged daily skin emollients -Encouraged use of pumice stone -Advised good supportive shoes and inserts -Patient to return to office in 3 to 4 months or as needed or sooner if condition worsens.  Landis Martins, DPM

## 2019-01-25 ENCOUNTER — Ambulatory Visit
Admission: RE | Admit: 2019-01-25 | Discharge: 2019-01-25 | Disposition: A | Discharge status: Home / Self Care | Payer: Medicare Other | Source: Ambulatory Visit | Attending: Hematology and Oncology | Admitting: Hematology and Oncology

## 2019-01-25 ENCOUNTER — Other Ambulatory Visit: Payer: Self-pay

## 2019-01-25 DIAGNOSIS — Z1231 Encounter for screening mammogram for malignant neoplasm of breast: Secondary | ICD-10-CM | POA: Diagnosis not present

## 2019-01-25 DIAGNOSIS — M8589 Other specified disorders of bone density and structure, multiple sites: Secondary | ICD-10-CM | POA: Diagnosis not present

## 2019-01-25 DIAGNOSIS — M8580 Other specified disorders of bone density and structure, unspecified site: Secondary | ICD-10-CM

## 2019-01-25 DIAGNOSIS — C50011 Malignant neoplasm of nipple and areola, right female breast: Secondary | ICD-10-CM

## 2019-01-25 DIAGNOSIS — Z780 Asymptomatic menopausal state: Secondary | ICD-10-CM | POA: Diagnosis not present

## 2019-02-10 DIAGNOSIS — J301 Allergic rhinitis due to pollen: Secondary | ICD-10-CM | POA: Diagnosis not present

## 2019-02-10 DIAGNOSIS — I10 Essential (primary) hypertension: Secondary | ICD-10-CM | POA: Diagnosis not present

## 2019-02-10 DIAGNOSIS — I779 Disorder of arteries and arterioles, unspecified: Secondary | ICD-10-CM | POA: Diagnosis not present

## 2019-02-10 DIAGNOSIS — N3281 Overactive bladder: Secondary | ICD-10-CM | POA: Diagnosis not present

## 2019-02-10 DIAGNOSIS — Z0000 Encounter for general adult medical examination without abnormal findings: Secondary | ICD-10-CM | POA: Diagnosis not present

## 2019-02-10 DIAGNOSIS — E785 Hyperlipidemia, unspecified: Secondary | ICD-10-CM | POA: Diagnosis not present

## 2019-02-10 DIAGNOSIS — L989 Disorder of the skin and subcutaneous tissue, unspecified: Secondary | ICD-10-CM | POA: Diagnosis not present

## 2019-02-11 DIAGNOSIS — E785 Hyperlipidemia, unspecified: Secondary | ICD-10-CM | POA: Diagnosis not present

## 2019-02-11 DIAGNOSIS — J301 Allergic rhinitis due to pollen: Secondary | ICD-10-CM | POA: Diagnosis not present

## 2019-02-15 DIAGNOSIS — L570 Actinic keratosis: Secondary | ICD-10-CM | POA: Diagnosis not present

## 2019-02-15 DIAGNOSIS — L905 Scar conditions and fibrosis of skin: Secondary | ICD-10-CM | POA: Diagnosis not present

## 2019-02-16 NOTE — Assessment & Plan Note (Signed)
Stage IIIa right breast cancer T2 N2 M0 diagnosed in June 2005 in [place] treated with mastectomy followed by adjuvant chemotherapy. 7 on 9 lymph nodes were apparently involved., Did not get adjuvant radiation(Unknown reasons). Current treatment: Arimidex 1 mg daily started January 2006  Arimidex toxicities: Denies any hot flashes or myalgias. 1.Bone density revealed osteopenia: Patient is on weightbearing exercise program last bone density March 2019 showed a T score of -1.4.   I discussed with her about stopping Fosamax especially because of data suggesting that long-term bisphosphonate therapy can actually increase the risk of fractures.    We discussed at length about duration of antiestrogen therapy. There is no data for risks or benefits beyond 10 years of antiestrogen therapy.  Patient wishes to take it for 1 more year and then stop it.    Surveillance: Mammogram 01/26/2019: No evidence of malignancy, breast density category C Breast exam 02/23/2018: Right mastectomy and no palpable abnormalities in the left breast. Return to clinic once a year for follow-up

## 2019-02-17 DIAGNOSIS — M792 Neuralgia and neuritis, unspecified: Secondary | ICD-10-CM | POA: Diagnosis not present

## 2019-02-22 ENCOUNTER — Telehealth: Payer: Self-pay | Admitting: Hematology and Oncology

## 2019-02-22 ENCOUNTER — Other Ambulatory Visit: Payer: Self-pay | Admitting: Hematology and Oncology

## 2019-02-22 DIAGNOSIS — C50911 Malignant neoplasm of unspecified site of right female breast: Secondary | ICD-10-CM

## 2019-02-22 NOTE — Telephone Encounter (Signed)
Called patient regarding upcoming Webex appointment, left patient a voicemail and emailed patient Webex information.

## 2019-02-24 ENCOUNTER — Ambulatory Visit: Payer: Medicare Other | Admitting: Hematology and Oncology

## 2019-02-25 NOTE — Progress Notes (Signed)
HEMATOLOGY-ONCOLOGY [place] VISIT PROGRESS NOTE  I connected with Barbara Rangel on 02/26/2019 at 11:15 AM EDT by Webex video conference and verified that I am speaking with the correct person using two identifiers.  I discussed the limitations, risks, security and privacy concerns of performing an evaluation and management service by Webex and the availability of in person appointments.  I also discussed with the patient that there may be a patient responsible charge related to this service. The patient expressed understanding and agreed to proceed.  Patient's Location: Home Physician Location: Clinic  CHIEF COMPLIANT: Follow-up on anastrozole therapy  INTERVAL HISTORY: Barbara Rangel is a 73 y.o. female with above-mentioned history of right breast cancer treated with mastectomy and adjuvant chemotherapy.  She is currently on Arimidex therapy since 2006. I last saw her a year ago. Her most recent mammogram on 01/26/19 showed no evidence of malignancy in the left breast. She presents today over Webex for annual evaluation.   We are discussing the role of continued extended adjuvant therapy and the risks and benefits of that approach.    Breast cancer, right breast ([place])   01/10/2004 Initial Diagnosis    Patient found right breast cancer on breast self exam after negative mammogram same year, diagnosis March 2005 in [place]    04/26/2004 Surgery    Right mastectomy: T2 N2a M0 ER/PR positive HER-2 negative stage IIIa; 9 lymph nodes were involved (did not get radiation)    05/18/2004 - 09/21/2004 Chemotherapy    NSABP B 28 clinical trial with dose dense Adriamycin and Cytoxan followed by Taxol     11/12/2004 -  Anti-estrogen oral therapy    Arimidex 1 mg daily    REVIEW OF SYSTEMS:   Constitutional: Denies fevers, chills or abnormal weight loss Eyes: Denies blurriness of vision Ears, nose, mouth, throat, and face: Denies mucositis or sore throat Respiratory: Denies cough, dyspnea  or wheezes Cardiovascular: Denies palpitation, chest discomfort Gastrointestinal:  Denies nausea, heartburn or change in bowel habits Skin: Denies abnormal skin rashes Lymphatics: Denies new lymphadenopathy or easy bruising Neurological:Denies numbness, tingling or new weaknesses Behavioral/Psych: Mood is stable, no new changes  Extremities: No lower extremity edema Breast: denies any pain or lumps or nodules in either breasts All other systems were reviewed with the patient and are negative.  Observations/Objective:   CMP Latest Ref Rng & Units 02/24/2017 08/26/2016 03/01/2016  Glucose 70 - 140 mg/dl 82 95 96  BUN 7.0 - 26.0 mg/dL 14.4 15.1 7  Creatinine 0.6 - 1.1 mg/dL 0.8 0.8 0.71  Sodium 136 - 145 mEq/L 143 141 143  Potassium 3.5 - 5.1 mEq/L 4.0 4.1 3.8  Chloride 101 - 111 mmol/L - - 105  CO2 22 - 29 mEq/L _0 Calcium 8.4 - 10.4 mg/dL 10.4 10.5(H) 10.1  Total Protein 6.4 - 8.3 g/dL 7.0 7.9 -  Total Bilirubin 0.20 - 1.20 mg/dL 0.60 0.63 -  Alkaline Phos 40 - 150 U/L 60 55 -  AST 5 - 34 U/L 27 27 -  ALT 0 - 55 U/L 26 26 -    Lab Results  Component Value Date   WBC 5.4 02/24/2017   HGB 14.3 02/24/2017   HCT 42.2 02/24/2017   MCV 95.7 02/24/2017   PLT 190 02/24/2017   NEUTROABS 3.0 02/24/2017      Assessment Plan:  Breast cancer, right breast Stage IIIa right breast cancer T2 N2 M0 diagnosed in June 2005 in [place] treated with mastectomy followed by adjuvant  chemotherapy. 7 on 9 lymph nodes were apparently involved., Did not get adjuvant radiation(Unknown reasons). Current treatment: Arimidex 1 mg daily started January 2006  Arimidex toxicities: Denies any hot flashes or myalgias. 1.Bone density revealed osteopenia: Patient is on weightbearing exercise program last bone density March 2020 showed a T score of -1.5 (was -1.4).   I discussed with her about stopping Fosamax especially because of data suggesting that long-term bisphosphonate therapy can actually  increase the risk of fractures.    We discussed at length about duration of antiestrogen therapy. There is no data for risks or benefits beyond 10 years of antiestrogen therapy.    Patient finally agreed to stop the antiestrogen therapy.  We discussed the risks and benefits of doing that.  Surveillance: Mammogram 01/26/2019: No evidence of malignancy, breast density category C Breast exam 02/23/2018: Right mastectomy and no palpable abnormalities in the left breast. Return to clinic once a year for follow-up  I discussed the assessment and treatment plan with the patient. The patient was provided an opportunity to ask questions and all were answered. The patient agreed with the plan and demonstrated an understanding of the instructions. The patient was advised to call back or seek an in-person evaluation if the symptoms worsen or if the condition fails to improve as anticipated.   I provided 15 minutes of face-to-face Web Ex time during this encounter.    Rulon Eisenmenger, MD 02/26/2019    I, Molly Dorshimer, am acting as scribe for Nicholas Lose, MD.  I have reviewed the above documentation for accuracy and completeness, and I agree with the above.

## 2019-02-26 ENCOUNTER — Inpatient Hospital Stay: Payer: Medicare Other | Attending: Hematology and Oncology | Admitting: Hematology and Oncology

## 2019-02-26 DIAGNOSIS — I6523 Occlusion and stenosis of bilateral carotid arteries: Secondary | ICD-10-CM | POA: Diagnosis not present

## 2019-02-26 DIAGNOSIS — C50011 Malignant neoplasm of nipple and areola, right female breast: Secondary | ICD-10-CM | POA: Diagnosis not present

## 2019-04-12 DIAGNOSIS — Z20828 Contact with and (suspected) exposure to other viral communicable diseases: Secondary | ICD-10-CM | POA: Diagnosis not present

## 2019-04-16 DIAGNOSIS — D225 Melanocytic nevi of trunk: Secondary | ICD-10-CM | POA: Diagnosis not present

## 2019-04-16 DIAGNOSIS — D2271 Melanocytic nevi of right lower limb, including hip: Secondary | ICD-10-CM | POA: Diagnosis not present

## 2019-04-16 DIAGNOSIS — L650 Telogen effluvium: Secondary | ICD-10-CM | POA: Diagnosis not present

## 2019-04-16 DIAGNOSIS — D1801 Hemangioma of skin and subcutaneous tissue: Secondary | ICD-10-CM | POA: Diagnosis not present

## 2019-04-16 DIAGNOSIS — L570 Actinic keratosis: Secondary | ICD-10-CM | POA: Diagnosis not present

## 2019-04-16 DIAGNOSIS — L821 Other seborrheic keratosis: Secondary | ICD-10-CM | POA: Diagnosis not present

## 2019-04-16 DIAGNOSIS — L814 Other melanin hyperpigmentation: Secondary | ICD-10-CM | POA: Diagnosis not present

## 2019-04-16 DIAGNOSIS — L817 Pigmented purpuric dermatosis: Secondary | ICD-10-CM | POA: Diagnosis not present

## 2019-04-21 DIAGNOSIS — H401131 Primary open-angle glaucoma, bilateral, mild stage: Secondary | ICD-10-CM | POA: Diagnosis not present

## 2019-04-22 DIAGNOSIS — D485 Neoplasm of uncertain behavior of skin: Secondary | ICD-10-CM | POA: Diagnosis not present

## 2019-04-22 DIAGNOSIS — L814 Other melanin hyperpigmentation: Secondary | ICD-10-CM | POA: Diagnosis not present

## 2019-04-27 ENCOUNTER — Encounter: Payer: Self-pay | Admitting: Sports Medicine

## 2019-04-27 ENCOUNTER — Other Ambulatory Visit: Payer: Self-pay

## 2019-04-27 ENCOUNTER — Ambulatory Visit (INDEPENDENT_AMBULATORY_CARE_PROVIDER_SITE_OTHER): Payer: Medicare Other | Admitting: Sports Medicine

## 2019-04-27 VITALS — Temp 96.4°F

## 2019-04-27 DIAGNOSIS — Q828 Other specified congenital malformations of skin: Secondary | ICD-10-CM | POA: Diagnosis not present

## 2019-04-27 DIAGNOSIS — M79671 Pain in right foot: Secondary | ICD-10-CM

## 2019-04-27 DIAGNOSIS — L989 Disorder of the skin and subcutaneous tissue, unspecified: Secondary | ICD-10-CM

## 2019-04-27 NOTE — Progress Notes (Signed)
Subjective: Barbara Rangel is a 73 y.o. female patient who presents to office for evaluation of right foot pain secondary to callus skin. Patient complains of pain at the lesion present sub-met 5 on right. Patient reports that it is time to have it trimmed again. Pads and trimming help. Patient denies any other pedal complaints.   Patient Active Problem List   Diagnosis Date Noted  . Neck pain 03/30/2018  . Osteopenia determined by x-ray 08/28/2016  . History of right breast cancer 08/28/2016  . Malignant neoplasm of right female breast ([place]) 01/23/2016  . History of chemotherapy 01/23/2016  . High risk medication use 07/29/2015  . Screening mammogram for high-risk patient 07/29/2015  . Estrogen deficiency 07/29/2015  . 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/24/2015  . Dense breast tissue 01/24/2015  . Hx of adenomatous colonic polyps 01/24/2015  . Plantar fasciitis, bilateral 01/24/2015  . Breast cancer, right breast ([place]) 07/15/2013  . Syncope 09/15/2012  . Hypokalemia 09/15/2012  . Chronic back pain   . Chronic urinary tract infection   . Hypertension   . Arthritis   . Synovial cyst of lumbar facet joint 11/01/2011    Current Outpatient Medications on File Prior to Visit  Medication Sig Dispense Refill  . acetaminophen (TYLENOL) 500 MG tablet Take 1,000 mg by mouth at bedtime.    Marland Kitchen amLODipine (NORVASC) 5 MG tablet Take 5 mg by mouth at bedtime.     Marland Kitchen aspirin EC 81 MG tablet Take 81 mg by mouth every morning.     Marland Kitchen atorvastatin (LIPITOR) 20 MG tablet Take 1 tablet by mouth every morning.    Marland Kitchen azelastine (ASTELIN) 0.1 % nasal spray   0  . calcium citrate-vitamin D (CITRACAL+D) 315-200 MG-UNIT per tablet Take 2 tablets by mouth 2 (two) times daily.     . Cranberry Extract 250 MG TABS Take 1 capsule by mouth at bedtime.     . docusate sodium (COLACE) 100 MG capsule Take 100 mg by mouth 2 (two) times daily.      . Famotidine (PEPCID PO) Take by mouth.    . latanoprost (XALATAN)  0.005 % ophthalmic solution Place 1 drop into both eyes at bedtime.      Marland Kitchen levocetirizine (XYZAL) 5 MG tablet Take 5 mg by mouth daily.    . Multiple Vitamins-Minerals (MULTIVITAMINS THER. W/MINERALS) TABS Take 1 tablet by mouth every morning.     Marland Kitchen omeprazole (PRILOSEC) 40 MG capsule Take 40 mg by mouth every morning.     . triamcinolone (NASACORT ALLERGY 24HR) 55 MCG/ACT AERO nasal inhaler Place 2 sprays into the nose as needed.      No current facility-administered medications on file prior to visit.     Allergies  Allergen Reactions  . Nitrous Oxide Other (See Comments)    Pt passed out and had confusion. Took a few hours to wake her up.  . Avelox [Moxifloxacin Hcl In Nacl] Other (See Comments)    Severe headache   . Cephalexin Other (See Comments)    Caused headache  . Other Other (See Comments)    Anesthesia, needs antiemetic med  . Quinolones Other (See Comments)    Headaches?  . Tramadol Nausea Only and Other (See Comments)    Nausea and Dizziness, too.  . Trimethoprim Other (See Comments)    Rapid heartbeat.  . Citalopram Hydrobromide Other (See Comments)    Pt can't remember what the side effect was.    Objective:  General: Alert and  oriented x3 in no acute distress  Dermatology: Keratotic lesion present sub-met 5 on right with skin lines transversing the lesion, pain is present with direct pressure to the lesion with a central nucleated core noted, no webspace macerations, no ecchymosis bilateral, all nails x 10 are well manicured.  Vascular: Dorsalis Pedis and Posterior Tibial pedal pulses 1/4, Capillary Fill Time 3 seconds, + pedal hair growth bilateral, no edema bilateral lower extremities, Temperature gradient within normal limits.  Neurology: Johney Maine sensation intact via light touch bilateral.  Musculoskeletal: Mild tenderness with palpation at the keratotic lesion site on Right, fat pad atrophy bunion and hammertoe deformity pes planus foot type with pain to  right sub-met 5 at area of nucleation as noted above.  Muscular strength 5/5 in all groups without pain or limitation on range of motion.   Assessment and Plan: Problem List Items Addressed This Visit    None    Visit Diagnoses    Porokeratosis    -  Primary   Benign skin lesion       Right foot pain          -Complete examination performed -Discussed treatment options -ABN on file -Parred keratoic lesion using a chisel blade x1; treated the area withSalinocaine covered with offloading padding, dispensed additional padding this visit -Encouraged daily skin emollients -Encouraged use of pumice stone -Advised good supportive shoes and inserts like before -Patient to return to office in 3 to 4 months or as needed or sooner if condition worsens.  Landis Martins, DPM

## 2019-06-17 ENCOUNTER — Other Ambulatory Visit (HOSPITAL_COMMUNITY): Payer: Self-pay | Admitting: Surgery

## 2019-06-17 ENCOUNTER — Other Ambulatory Visit: Payer: Self-pay

## 2019-06-17 DIAGNOSIS — Z136 Encounter for screening for cardiovascular disorders: Secondary | ICD-10-CM

## 2019-06-17 DIAGNOSIS — I6523 Occlusion and stenosis of bilateral carotid arteries: Secondary | ICD-10-CM

## 2019-06-17 DIAGNOSIS — I10 Essential (primary) hypertension: Secondary | ICD-10-CM

## 2019-06-18 ENCOUNTER — Telehealth (HOSPITAL_COMMUNITY): Payer: Self-pay | Admitting: Rehabilitation

## 2019-06-18 NOTE — Telephone Encounter (Signed)

## 2019-06-21 ENCOUNTER — Other Ambulatory Visit: Payer: Self-pay

## 2019-06-21 ENCOUNTER — Ambulatory Visit (INDEPENDENT_AMBULATORY_CARE_PROVIDER_SITE_OTHER)
Admission: RE | Admit: 2019-06-21 | Discharge: 2019-06-21 | Disposition: A | Discharge status: Home / Self Care | Payer: Medicare Other | Source: Ambulatory Visit | Attending: Family | Admitting: Family

## 2019-06-21 ENCOUNTER — Ambulatory Visit (INDEPENDENT_AMBULATORY_CARE_PROVIDER_SITE_OTHER): Payer: Medicare Other | Admitting: Family

## 2019-06-21 ENCOUNTER — Ambulatory Visit (HOSPITAL_COMMUNITY)
Admission: RE | Admit: 2019-06-21 | Discharge: 2019-06-21 | Disposition: A | Discharge status: Home / Self Care | Payer: Medicare Other | Source: Ambulatory Visit | Attending: Family | Admitting: Family

## 2019-06-21 ENCOUNTER — Encounter: Payer: Self-pay | Admitting: Family

## 2019-06-21 VITALS — BP 137/75 | HR 73 | Temp 95.0°F | Resp 16 | Ht 65.0 in | Wt 131.0 lb

## 2019-06-21 DIAGNOSIS — I6523 Occlusion and stenosis of bilateral carotid arteries: Secondary | ICD-10-CM

## 2019-06-21 DIAGNOSIS — I781 Nevus, non-neoplastic: Secondary | ICD-10-CM | POA: Diagnosis not present

## 2019-06-21 DIAGNOSIS — Z136 Encounter for screening for cardiovascular disorders: Secondary | ICD-10-CM

## 2019-06-21 NOTE — Progress Notes (Signed)
Chief Complaint: Follow up Extracranial Carotid Artery Stenosis   History of Present Illness  Barbara Rangel is a 73 y.o. female who had a carotid duplex at an outlying center suggesting high-grade left carotid stenosis. We repeated her duplex that time and did not see this level of stenosis and recommended follow-up only.   She is here today for repeat carotid duplex. She specifically denies any symptoms of amaurosis fugax, aphasia or transient ischemic attack or stroke. She does have a occasional headaches. Denies any new cardiac difficulty.  Dr. Donnetta Hutching last evaluated pt on 06-24-17. At that time he explained that that she apparently did have an over estimation of her degree of left carotid stenosis at an outlying lab. She was to continue her usual activities. She was to return in a year for repeat ultrasound. She was advised to present immediately to the emergency room should she develop any neurologic deficits.  She has a hx of a right mastectomy with lymph node dissection.   She denies claudication type symptoms with walking.   Diabetic: no Tobacco use: non-smoker  Pt meds include: Statin : yes ASA: yes Other anticoagulants/antiplatelets: no   Past Medical History:  Diagnosis Date  . Arthritis    degenerative arthritis  . Cancer ([place])    hx breast cancer-right  . Chronic back pain    right radicular leg pain  . Chronic urinary tract infection    takes Macrodantin and Cranberry daily  . Constipation    r/t pain meds and takes Dulcolax nightly  . FHx: colonic polyps    hx of and mother had colon cancer  . Glaucoma    uses Xalantan nightly  . Hypertension    takes Amlodipine daily  . Other and unspecified general anesthetics causing adverse effect in therapeutic use    hard to wake up  . PONV (postoperative nausea and vomiting)     Social History Social History   Tobacco Use  . Smoking status: Never Smoker  . Smokeless tobacco: Never Used   Substance Use Topics  . Alcohol use: No  . Drug use: No    Family History Family History  Problem Relation Age of Onset  . Colon cancer Mother   . Breast cancer Cousin        on dads side  . Breast cancer Cousin        on moms side  . Anesthesia problems Neg Hx   . Hypotension Neg Hx   . Malignant hyperthermia Neg Hx   . Pseudochol deficiency Neg Hx     Surgical History Past Surgical History:  Procedure Laterality Date  . ANKLE SURGERY  2011   left ankle with screws and plates  . BREAST LUMPECTOMY     x2  . cataract surgery  2005   right eye  . COLONOSCOPY    . COLONOSCOPY WITH PROPOFOL N/A 10/11/2014   Procedure: COLONOSCOPY WITH PROPOFOL;  Surgeon: Garlan Fair, MD;  Location: [facility];  Service: Endoscopy;  Laterality: N/A;  . EYE SURGERY  2004   right eye-detached retina  . left breast biopsy  2009  . LUMBAR LAMINECTOMY/DECOMPRESSION MICRODISCECTOMY  10/31/2011   Procedure: LUMBAR LAMINECTOMY/DECOMPRESSION MICRODISCECTOMY;  Surgeon: Dahlia Bailiff;  Location: [facility];  Service: Orthopedics;  Laterality: Right;  L4-5 Decompression with Right Facet Decompression   . MASTECTOMY  2005   right  d/t breast cancer;no sticks to right arm LYMPH NODES REMOVED.  Marland Kitchen RETINAL DETACHMENT SURGERY  2004  .  right breast biopsy  2005  . TUBAL LIGATION  1980    Allergies  Allergen Reactions  . Nitrous Oxide Other (See Comments)    Pt passed out and had confusion. Took a few hours to wake her up.  . Avelox [Moxifloxacin Hcl In Nacl] Other (See Comments)    Severe headache   . Cephalexin Other (See Comments)    Caused headache  . Other Other (See Comments)    Anesthesia, needs antiemetic med  . Quinolones Other (See Comments)    Headaches?  . Tramadol Nausea Only and Other (See Comments)    Nausea and Dizziness, too.  . Trimethoprim Other (See Comments)    Rapid heartbeat.  . Citalopram Hydrobromide Other (See Comments)    Pt can't remember what the side effect was.     Current Outpatient Medications  Medication Sig Dispense Refill  . acetaminophen (TYLENOL) 500 MG tablet Take 1,000 mg by mouth at bedtime.    Marland Kitchen amLODipine (NORVASC) 5 MG tablet Take 5 mg by mouth at bedtime.     Marland Kitchen aspirin EC 81 MG tablet Take 81 mg by mouth every morning.     Marland Kitchen atorvastatin (LIPITOR) 20 MG tablet Take 1 tablet by mouth every morning.    Marland Kitchen azelastine (ASTELIN) 0.1 % nasal spray   0  . calcium citrate-vitamin D (CITRACAL+D) 315-200 MG-UNIT per tablet Take 2 tablets by mouth 2 (two) times daily.     . Cranberry Extract 250 MG TABS Take 1 capsule by mouth at bedtime.     . Famotidine (PEPCID PO) Take by mouth.    . famotidine (PEPCID) 20 MG tablet TK 1 T PO QD HS PRN    . latanoprost (XALATAN) 0.005 % ophthalmic solution Place 1 drop into both eyes at bedtime.      Marland Kitchen levocetirizine (XYZAL) 5 MG tablet Take 5 mg by mouth daily.    . Multiple Vitamins-Minerals (MULTIVITAMINS THER. W/MINERALS) TABS Take 1 tablet by mouth every morning.     Marland Kitchen omeprazole (PRILOSEC) 40 MG capsule Take 20 mg by mouth every morning.     . triamcinolone (NASACORT ALLERGY 24HR) 55 MCG/ACT AERO nasal inhaler Place 2 sprays into the nose as needed.     . docusate sodium (COLACE) 100 MG capsule Take 100 mg by mouth 2 (two) times daily.       No current facility-administered medications for this visit.     Review of Systems : See HPI for pertinent positives and negatives.  Physical Examination  Vitals:   06/21/19 0944  BP: 137/75  Pulse: 73  Resp: 16  Temp: (!) 95 F (35 C)  TempSrc: Temporal  SpO2: 100%  Weight: 131 lb (59.4 kg)  Height: 5\' 5"  (1.651 m)   Body mass index is 21.8 kg/m.  General: WDWN slender female in NAD GAIT: normal Eyes: PERRLA HENT: No gross abnormalities.  Pulmonary:  Respirations are non-labored, good air movement in all fields, CTAB, no rales, rhonchi, or wheezing. Cardiac: Regular rhythm, no detected murmur.  VASCULAR EXAM Carotid Bruits Right Left    Negative Negative     Abdominal aortic pulse is not palpable. Radial pulses are 2+ palpable and equal.  LE Pulses Right Left       POPLITEAL  not palpable   not palpable       POSTERIOR TIBIAL  not palpable   not palpable        DORSALIS PEDIS      ANTERIOR TIBIAL not palpable  not palpable     Gastrointestinal: soft, nontender, BS WNL, no r/g, no palpable masses. Musculoskeletal: no muscle atrophy/wasting. M/S 5/5 throughout, extremities without ischemic changes. Skin: No rashes, no ulcers, no cellulitis. Several superficial spider veins noted both legs. Hemosiderin staining of both lower legs in a gaiter fashion.   Neurologic:  A&O X 3; appropriate affect, sensation is normal; speech is normal, CN 2-12 intact, pain and light touch intact in extremities, motor exam as listed above. Psychiatric: Normal thought content, mood appropriate to clinical situation.    DATA  Carotid Duplex (06-21-19): Right Carotid: Velocities in the right ICA are consistent with a 40-59%                stenosis. Non-hemodynamically significant plaque <50% noted in                the CCA. The ECA appears <50% stenosed. Elevated velocities may                be due to tortuosity. Left Carotid: Velocities in the left ICA are consistent with a 40-59% stenosis.               Non-hemodynamically significant plaque noted in the CCA. The ECA               appears <50% stenosed. Unable to duplicate elevated velocities               noted on prior exam. Elevated velocities may be due to tortuosity. Vertebrals:  Bilateral vertebral arteries demonstrate antegrade flow. Subclavians: Normal flow hemodynamics were seen in bilateral subclavian arteries. Compared to the exam on 12-15-18: Stable stenosis in the right ICA at 40-59%, and less stenosis in the left ICA, today at 40-59%.    Abdominal  Aorta Findings (06-21-19): +-----------+-------+----------+----------+---------+--------+--------+ Location   AP (cm)Trans (cm)PSV (cm/s)Waveform ThrombusComments +-----------+-------+----------+----------+---------+--------+--------+ Supraceliac2.82   2.94      61        triphasic                 +-----------+-------+----------+----------+---------+--------+--------+ Proximal   2.06   2.01      90        triphasic                 +-----------+-------+----------+----------+---------+--------+--------+ Mid        1.74   1.96      98        triphasic                 +-----------+-------+----------+----------+---------+--------+--------+ Distal     1.59   1.96      99        triphasic                 +-----------+-------+----------+----------+---------+--------+--------+ RT CIA Prox1.1    0.9       143       triphasic                 +-----------+-------+----------+----------+---------+--------+--------+ LT CIA Prox0.8    1.0       148       triphasic                 +-----------+-------+----------+----------+---------+--------+--------+  Summary: Abdominal Aorta: No evidence of an abdominal aortic aneurysm was visualized. The largest aortic measurement is 2.9 cm.   Assessment: Barbara Rangel is a 73 y.o. female who has no history of stroke or TIA.   Carotid duplex today shows 40-59% bilateral ICA stenosis. Compared to the exam on 12-15-18, stable in the right ICA, and less stenosis in the left ICA.   Medicare screening of abdominal arteries shows normal diameters and all triphasic waveforms, no follow up duplex necessary.    Fortunately she does not have DM, has never used tobacco, is slender, exercises most days of the week. She takes a daily statin and 81 mg ASA.    Plan: Follow-up in 6 months with Carotid Duplex scan.   I discussed in depth with the patient the nature of atherosclerosis, and emphasized the importance of  maximal medical management including strict control of blood pressure, blood glucose, and lipid levels, obtaining regular exercise, and continued cessation of smoking.  The patient is aware that without maximal medical management the underlying atherosclerotic disease process will progress, limiting the benefit of any interventions. The patient was given information about stroke prevention and what symptoms should prompt the patient to seek immediate medical care. Thank you for allowing [place] to participate in this patient's care.  Clemon Chambers, RN, MSN, FNP-C [facility] Office: [facility] Physician: Trula Slade  06/21/19 10:09 AM

## 2019-06-21 NOTE — Patient Instructions (Signed)

## 2019-07-18 DIAGNOSIS — M1990 Unspecified osteoarthritis, unspecified site: Secondary | ICD-10-CM | POA: Diagnosis not present

## 2019-07-18 DIAGNOSIS — R0789 Other chest pain: Secondary | ICD-10-CM | POA: Diagnosis not present

## 2019-07-18 DIAGNOSIS — F419 Anxiety disorder, unspecified: Secondary | ICD-10-CM | POA: Diagnosis not present

## 2019-07-22 DIAGNOSIS — L570 Actinic keratosis: Secondary | ICD-10-CM | POA: Diagnosis not present

## 2019-07-22 DIAGNOSIS — L658 Other specified nonscarring hair loss: Secondary | ICD-10-CM | POA: Diagnosis not present

## 2019-07-27 ENCOUNTER — Encounter: Payer: Self-pay | Admitting: Sports Medicine

## 2019-07-27 ENCOUNTER — Other Ambulatory Visit: Payer: Self-pay

## 2019-07-27 ENCOUNTER — Ambulatory Visit (INDEPENDENT_AMBULATORY_CARE_PROVIDER_SITE_OTHER): Payer: Medicare Other | Admitting: Sports Medicine

## 2019-07-27 DIAGNOSIS — M79671 Pain in right foot: Secondary | ICD-10-CM | POA: Diagnosis not present

## 2019-07-27 DIAGNOSIS — L989 Disorder of the skin and subcutaneous tissue, unspecified: Secondary | ICD-10-CM

## 2019-07-27 DIAGNOSIS — Q828 Other specified congenital malformations of skin: Secondary | ICD-10-CM

## 2019-07-27 NOTE — Progress Notes (Signed)
Subjective: Barbara Rangel is a 73 y.o. female patient who returns to office for  Follow up evaluation of right foot pain secondary to callus skin. Patient complains of pain at the lesion present sub-met 5 on right. Patient reports that it is time to have it trimmed again. Pads and trimming, pumice stone, and soaking helps. Patient also reports that the pad helps. Patient denies any other pedal complaints.   Patient Active Problem List   Diagnosis Date Noted  . Neck pain 03/30/2018  . Osteopenia determined by x-ray 08/28/2016  . History of right breast cancer 08/28/2016  . Malignant neoplasm of right female breast ([place]) 01/23/2016  . History of chemotherapy 01/23/2016  . High risk medication use 07/29/2015  . Screening mammogram for high-risk patient 07/29/2015  . Estrogen deficiency 07/29/2015  . 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/24/2015  . Dense breast tissue 01/24/2015  . Hx of adenomatous colonic polyps 01/24/2015  . Plantar fasciitis, bilateral 01/24/2015  . Breast cancer, right breast ([place]) 07/15/2013  . Syncope 09/15/2012  . Hypokalemia 09/15/2012  . Chronic back pain   . Chronic urinary tract infection   . Hypertension   . Arthritis   . Synovial cyst of lumbar facet joint 11/01/2011    Current Outpatient Medications on File Prior to Visit  Medication Sig Dispense Refill  . acetaminophen (TYLENOL) 500 MG tablet Take 1,000 mg by mouth at bedtime.    Marland Kitchen amLODipine (NORVASC) 5 MG tablet Take 5 mg by mouth at bedtime.     Marland Kitchen aspirin EC 81 MG tablet Take 81 mg by mouth every morning.     Marland Kitchen atorvastatin (LIPITOR) 20 MG tablet Take 1 tablet by mouth every morning.    Marland Kitchen azelastine (ASTELIN) 0.1 % nasal spray   0  . calcium citrate-vitamin D (CITRACAL+D) 315-200 MG-UNIT per tablet Take 2 tablets by mouth 2 (two) times daily.     . Cranberry Extract 250 MG TABS Take 1 capsule by mouth at bedtime.     . docusate sodium (COLACE) 100 MG capsule Take 100 mg by mouth 2 (two) times  daily.      . famotidine (PEPCID) 20 MG tablet TK 1 T PO QD HS PRN    . latanoprost (XALATAN) 0.005 % ophthalmic solution Place 1 drop into both eyes at bedtime.      Marland Kitchen levocetirizine (XYZAL) 5 MG tablet Take 5 mg by mouth daily.    Marland Kitchen LORazepam (ATIVAN) 0.5 MG tablet TAKE 1/2 TO 1 TABLET BY MOUTH TWICE DAILY AS NEEDED ONLY WHEN NEEDED    . Multiple Vitamins-Minerals (MULTIVITAMINS THER. W/MINERALS) TABS Take 1 tablet by mouth every morning.     Marland Kitchen omeprazole (PRILOSEC) 40 MG capsule Take 40 mg by mouth every other day.     . triamcinolone (NASACORT ALLERGY 24HR) 55 MCG/ACT AERO nasal inhaler Place 2 sprays into the nose as needed.      No current facility-administered medications on file prior to visit.     Allergies  Allergen Reactions  . Nitrous Oxide Other (See Comments)    Pt passed out and had confusion. Took a few hours to wake her up.  . Avelox [Moxifloxacin Hcl In Nacl] Other (See Comments)    Severe headache   . Cephalexin Other (See Comments)    Caused headache  . Other Other (See Comments)    Anesthesia, needs antiemetic med  . Quinolones Other (See Comments)    Headaches?  . Tramadol Nausea Only and Other (See Comments)  Nausea and Dizziness, too.  . Trimethoprim Other (See Comments)    Rapid heartbeat.  . Citalopram Hydrobromide Other (See Comments)    Pt can't remember what the side effect was.    Objective:  General: Alert and oriented x3 in no acute distress  Dermatology: Keratotic lesion present sub-met 5 on right with skin lines transversing the lesion, pain is present with direct pressure to the lesion with a central nucleated core noted, no webspace macerations, no ecchymosis bilateral, all nails x 10 are well manicured.  Vascular: Dorsalis Pedis and Posterior Tibial pedal pulses 1/4, Capillary Fill Time 3 seconds, + pedal hair growth bilateral, no edema bilateral lower extremities, Temperature gradient within normal limits.  Neurology: Johney Maine sensation  intact via light touch bilateral.  Musculoskeletal: Mild tenderness with palpation at the keratotic lesion site on Right, fat pad atrophy bunion and hammertoe deformity pes planus foot type with pain to right sub-met 5 at area of nucleation as noted above.  Muscular strength 5/5 in all groups without pain or limitation on range of motion.   Assessment and Plan: Problem List Items Addressed This Visit    None    Visit Diagnoses    Porokeratosis    -  Primary   Benign skin lesion       Right foot pain         -Complete examination performed -Re-Discussed treatment options -Parred keratoic lesion using a chisel blade x1; treated the area withSalinocaine covered with offloading padding, dispensed additional padding again this visit -Encouraged daily skin emollients -Encouraged use of pumice stone -Advised good supportive shoes and inserts like before -Patient to return to office in 3 to 4 months or as needed or sooner if condition worsens.  Landis Martins, DPM

## 2019-08-16 ENCOUNTER — Ambulatory Visit (INDEPENDENT_AMBULATORY_CARE_PROVIDER_SITE_OTHER): Payer: Medicare Other | Admitting: Neurology

## 2019-08-16 ENCOUNTER — Telehealth: Payer: Self-pay | Admitting: Neurology

## 2019-08-16 ENCOUNTER — Encounter: Payer: Self-pay | Admitting: Neurology

## 2019-08-16 ENCOUNTER — Other Ambulatory Visit: Payer: Self-pay

## 2019-08-16 VITALS — BP 136/78 | HR 83 | Ht 65.0 in | Wt 132.0 lb

## 2019-08-16 DIAGNOSIS — R2689 Other abnormalities of gait and mobility: Secondary | ICD-10-CM

## 2019-08-16 DIAGNOSIS — I6523 Occlusion and stenosis of bilateral carotid arteries: Secondary | ICD-10-CM

## 2019-08-16 DIAGNOSIS — Z853 Personal history of malignant neoplasm of breast: Secondary | ICD-10-CM

## 2019-08-16 DIAGNOSIS — Z9181 History of falling: Secondary | ICD-10-CM | POA: Diagnosis not present

## 2019-08-16 DIAGNOSIS — R413 Other amnesia: Secondary | ICD-10-CM

## 2019-08-16 DIAGNOSIS — R419 Unspecified symptoms and signs involving cognitive functions and awareness: Secondary | ICD-10-CM | POA: Diagnosis not present

## 2019-08-16 NOTE — Patient Instructions (Signed)
Your neurological exam is stable and non-focal. Your memory scores are fairly stable as well. We will proceed with testing for comparison with prior test results.  I will order a brain MRI with and without contrast.  We will do a blood work today to ensure that your kidney function is okay to be able to proceed with brain MRI with and without contrast.  I would also like to make a referral to neuropsychology for more in-depth memory testing which you have had before some years ago.  I will make a referral to neuropsychology. For now, we will keep you posted as to your test results.  For your problems with the balance, I suggest we make a referral to physical therapy for evaluation and potential treatment.  We will make a follow-up appointment if needed.  Please continue to stay well-hydrated, well rested, pursue good nutrition.
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
## 2019-08-16 NOTE — Telephone Encounter (Signed)
Medicare/bcbs supp order sent to GI. No auth they will reach out to the patient to schedule.  

## 2019-08-17 ENCOUNTER — Encounter: Payer: Self-pay | Admitting: Psychology

## 2019-08-17 LAB — COMPREHENSIVE METABOLIC PANEL
ALT: 28 IU/L (ref 0–32)
AST: 34 IU/L (ref 0–40)
Albumin/Globulin Ratio: 1.6 (ref 1.2–2.2)
Albumin: 4.4 g/dL (ref 3.7–4.7)
Alkaline Phosphatase: 79 IU/L (ref 39–117)
BUN/Creatinine Ratio: 15 (ref 12–28)
BUN: 12 mg/dL (ref 8–27)
Bilirubin Total: 0.5 mg/dL (ref 0.0–1.2)
CO2: 26 mmol/L (ref 20–29)
Calcium: 10.5 mg/dL — ABNORMAL HIGH (ref 8.7–10.3)
Chloride: 103 mmol/L (ref 96–106)
Creatinine, Ser: 0.78 mg/dL (ref 0.57–1.00)
GFR calc Af Amer: 88 mL/min/{1.73_m2} (ref >59)
GFR calc non Af Amer: 76 mL/min/{1.73_m2} (ref >59)
Globulin, Total: 2.8 g/dL (ref 1.5–4.5)
Glucose: 86 mg/dL (ref 65–99)
Potassium: 4.2 mmol/L (ref 3.5–5.2)
Sodium: 142 mmol/L (ref 134–144)
Total Protein: 7.2 g/dL (ref 6.0–8.5)

## 2019-08-18 DIAGNOSIS — Z23 Encounter for immunization: Secondary | ICD-10-CM | POA: Diagnosis not present

## 2019-08-31 ENCOUNTER — Emergency Department (HOSPITAL_COMMUNITY): Payer: Medicare Other

## 2019-08-31 ENCOUNTER — Encounter (HOSPITAL_COMMUNITY): Payer: Self-pay

## 2019-08-31 ENCOUNTER — Emergency Department (HOSPITAL_COMMUNITY)
Admission: EM | Admit: 2019-08-31 | Discharge: 2019-09-01 | Disposition: A | Discharge status: Home / Self Care | Payer: Medicare Other | Attending: Emergency Medicine | Admitting: Emergency Medicine

## 2019-08-31 ENCOUNTER — Other Ambulatory Visit: Payer: Self-pay

## 2019-08-31 DIAGNOSIS — R519 Headache, unspecified: Secondary | ICD-10-CM | POA: Diagnosis not present

## 2019-08-31 DIAGNOSIS — Z7982 Long term (current) use of aspirin: Secondary | ICD-10-CM | POA: Insufficient documentation

## 2019-08-31 DIAGNOSIS — Z79899 Other long term (current) drug therapy: Secondary | ICD-10-CM | POA: Diagnosis not present

## 2019-08-31 DIAGNOSIS — I10 Essential (primary) hypertension: Secondary | ICD-10-CM | POA: Insufficient documentation

## 2019-08-31 DIAGNOSIS — Z853 Personal history of malignant neoplasm of breast: Secondary | ICD-10-CM | POA: Insufficient documentation

## 2019-08-31 LAB — BASIC METABOLIC PANEL
Anion gap: 12 (ref 5–15)
BUN: 11 mg/dL (ref 8–23)
CO2: 24 mmol/L (ref 22–32)
Calcium: 9.9 mg/dL (ref 8.9–10.3)
Chloride: 102 mmol/L (ref 98–111)
Creatinine, Ser: 0.76 mg/dL (ref 0.44–1.00)
GFR calc Af Amer: >60 mL/min (ref >60)
GFR calc non Af Amer: >60 mL/min (ref >60)
Glucose, Bld: 112 mg/dL — ABNORMAL HIGH (ref 70–99)
Potassium: 3.7 mmol/L (ref 3.5–5.1)
Sodium: 138 mmol/L (ref 135–145)

## 2019-08-31 LAB — CBC
HCT: 46.3 % — ABNORMAL HIGH (ref 36.0–46.0)
Hemoglobin: 15.4 g/dL — ABNORMAL HIGH (ref 12.0–15.0)
MCH: 32.7 pg (ref 26.0–34.0)
MCHC: 33.3 g/dL (ref 30.0–36.0)
MCV: 98.3 fL (ref 80.0–100.0)
Platelets: 220 10*3/uL (ref 150–400)
RBC: 4.71 MIL/uL (ref 3.87–5.11)
RDW: 14 % (ref 11.5–15.5)
WBC: 5.8 10*3/uL (ref 4.0–10.5)
nRBC: 0 % (ref 0.0–0.2)

## 2019-08-31 MED ORDER — SODIUM CHLORIDE 0.9% FLUSH: 3.0000 mL | Freq: Once | INTRAVENOUS | Status: DC

## 2019-08-31 NOTE — ED Notes (Signed)
No response for triage

## 2019-08-31 NOTE — ED Triage Notes (Signed)
Pt reports severe migraine for the past few days. Denies n/v. Pt a.o

## 2019-09-01 ENCOUNTER — Telehealth: Payer: Self-pay | Admitting: Neurology

## 2019-09-01 NOTE — Telephone Encounter (Signed)
The head CT was negative for any acute findings thankfully.  I would still recommend pursuing the brain MRI as it is more detailed and lets [place] compare with the last MRI. Please call her back.

## 2019-09-01 NOTE — ED Provider Notes (Signed)
[facility] EMERGENCY DEPARTMENT Provider Note   CSN: [id number] Arrival date & time: 08/31/19  2142     History   Chief Complaint Chief Complaint  Patient presents with  . Migraine    HPI Barbara Rangel is a 73 y.o. female.     Patient is a 73 year old female with past medical history of arthritis, breast cancer, hypertension.  She presents today for evaluation of headache.  Patient reports intermittent pain to the right posterior aspect of her head that has been occurring intermittently for the past several weeks. She denies any nausea or vomiting.  She denies any visual disturbances.  The history is provided by the patient.  Migraine This is a new problem. Episode onset: 3 weeks ago. Episode frequency: Intermittently. The problem has been gradually worsening. Associated symptoms include headaches. Pertinent negatives include no chest pain. Nothing aggravates the symptoms. Nothing relieves the symptoms. She has tried acetaminophen for the symptoms. The treatment provided mild relief.    Past Medical History:  Diagnosis Date  . Arthritis    degenerative arthritis  . Cancer ([place])    hx breast cancer-right  . Chronic back pain    right radicular leg pain  . Chronic urinary tract infection    takes Macrodantin and Cranberry daily  . Constipation    r/t pain meds and takes Dulcolax nightly  . FHx: colonic polyps    hx of and mother had colon cancer  . Glaucoma    uses Xalantan nightly  . Hypertension    takes Amlodipine daily  . Other and unspecified general anesthetics causing adverse effect in therapeutic use    hard to wake up  . PONV (postoperative nausea and vomiting)     Patient Active Problem List   Diagnosis Date Noted  . Neck pain 03/30/2018  . Osteopenia determined by x-ray 08/28/2016  . History of right breast cancer 08/28/2016  . Malignant neoplasm of right female breast ([place]) 01/23/2016  . History of chemotherapy 01/23/2016  .  High risk medication use 07/29/2015  . Screening mammogram for high-risk patient 07/29/2015  . Estrogen deficiency 07/29/2015  . 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/24/2015  . Dense breast tissue 01/24/2015  . Hx of adenomatous colonic polyps 01/24/2015  . Plantar fasciitis, bilateral 01/24/2015  . Breast cancer, right breast ([place]) 07/15/2013  . Syncope 09/15/2012  . Hypokalemia 09/15/2012  . Chronic back pain   . Chronic urinary tract infection   . Hypertension   . Arthritis   . Synovial cyst of lumbar facet joint 11/01/2011    Past Surgical History:  Procedure Laterality Date  . ANKLE SURGERY  2011   left ankle with screws and plates  . BREAST LUMPECTOMY     x2  . cataract surgery  2005   right eye  . COLONOSCOPY    . COLONOSCOPY WITH PROPOFOL N/A 10/11/2014   Procedure: COLONOSCOPY WITH PROPOFOL;  Surgeon: Garlan Fair, MD;  Location: [facility];  Service: Endoscopy;  Laterality: N/A;  . EYE SURGERY  2004   right eye-detached retina  . left breast biopsy  2009  . LUMBAR LAMINECTOMY/DECOMPRESSION MICRODISCECTOMY  10/31/2011   Procedure: LUMBAR LAMINECTOMY/DECOMPRESSION MICRODISCECTOMY;  Surgeon: Dahlia Bailiff;  Location: [facility];  Service: Orthopedics;  Laterality: Right;  L4-5 Decompression with Right Facet Decompression   . MASTECTOMY  2005   right  d/t breast cancer;no sticks to right arm LYMPH NODES REMOVED.  Marland Kitchen RETINAL DETACHMENT SURGERY  2004  . right breast  biopsy  2005  . TUBAL LIGATION  1980     OB History   No obstetric history on file.      Home Medications    Prior to Admission medications   Medication Sig Start Date End Date Taking? Authorizing Provider  acetaminophen (TYLENOL) 500 MG tablet Take 1,000 mg by mouth at bedtime.    [provider]  amLODipine (NORVASC) 5 MG tablet Take 5 mg by mouth at bedtime.     [provider]  aspirin EC 81 MG tablet Take 81 mg by mouth every morning.     [provider]   atorvastatin (LIPITOR) 20 MG tablet Take 1 tablet by mouth every morning. 04/21/13   [provider]  azelastine (ASTELIN) 0.1 % nasal spray  10/16/17   [provider]  calcium citrate-vitamin D (CITRACAL+D) 315-200 MG-UNIT per tablet Take 2 tablets by mouth 2 (two) times daily.     [provider]  Cranberry Extract 250 MG TABS Take 1 capsule by mouth at bedtime.     [provider]  docusate sodium (COLACE) 100 MG capsule Take 100 mg by mouth 2 (two) times daily.      [provider]  famotidine (PEPCID) 20 MG tablet TK 1 T PO QD HS PRN 05/26/19   [provider]  latanoprost (XALATAN) 0.005 % ophthalmic solution Place 1 drop into both eyes at bedtime.      [provider]  levocetirizine (XYZAL) 5 MG tablet Take 5 mg by mouth daily.    [provider]  LORazepam (ATIVAN) 0.5 MG tablet TAKE 1/2 TO 1 TABLET BY MOUTH TWICE DAILY AS NEEDED ONLY WHEN NEEDED 07/18/19   [provider]  Multiple Vitamins-Minerals (MULTIVITAMINS THER. W/MINERALS) TABS Take 1 tablet by mouth every morning.     [provider]  omeprazole (PRILOSEC) 40 MG capsule Take 40 mg by mouth every other day.     [provider]  triamcinolone (NASACORT ALLERGY 24HR) 55 MCG/ACT AERO nasal inhaler Place 2 sprays into the nose as needed.     [provider]    Family History Family History  Problem Relation Age of Onset  . Colon cancer Mother   . Breast cancer Cousin        on dads side  . Breast cancer Cousin        on moms side  . Anesthesia problems Neg Hx   . Hypotension Neg Hx   . Malignant hyperthermia Neg Hx   . Pseudochol deficiency Neg Hx     Social History Social History   Tobacco Use  . Smoking status: Never Smoker  . Smokeless tobacco: Never Used  Substance Use Topics  . Alcohol use: No  . Drug use: No     Allergies   Nitrous oxide, Avelox [moxifloxacin hcl in nacl], Cephalexin, Other,  Quinolones, Tramadol, Trimethoprim, and Citalopram hydrobromide   Review of Systems Review of Systems  Cardiovascular: Negative for chest pain.  Neurological: Positive for headaches.  All other systems reviewed and are negative.    Physical Exam Updated Vital Signs BP 120/62   Pulse 72   Temp 98.2 F (36.8 C)   Resp 18   SpO2 100%   Physical Exam Vitals signs and nursing note reviewed.  Constitutional:      General: She is not in acute distress.    Appearance: She is well-developed. She is not diaphoretic.  HENT:     Head: Normocephalic and atraumatic.  Eyes:     Extraocular Movements: Extraocular movements intact.     Pupils: Pupils are equal, round, and reactive to light.  Neck:     Musculoskeletal: Normal range of motion and neck supple.  Cardiovascular:     Rate and Rhythm: Normal rate and regular rhythm.     Heart sounds: No murmur. No friction rub. No gallop.   Pulmonary:     Effort: Pulmonary effort is normal. No respiratory distress.     Breath sounds: Normal breath sounds. No wheezing.  Abdominal:     General: Bowel sounds are normal. There is no distension.     Palpations: Abdomen is soft.     Tenderness: There is no abdominal tenderness.  Musculoskeletal: Normal range of motion.  Skin:    General: Skin is warm and dry.  Neurological:     General: No focal deficit present.     Mental Status: She is alert and oriented to person, place, and time.     Cranial Nerves: No cranial nerve deficit.     Sensory: No sensory deficit.     Motor: No weakness.     Coordination: Coordination normal.      ED Treatments / Results  Labs (all labs ordered are listed, but only abnormal results are displayed) Labs Reviewed  BASIC METABOLIC PANEL - Abnormal; Notable for the following components:      Result Value   Glucose, Bld 112 (*)    All other components within normal limits  CBC - Abnormal; Notable for the following components:   Hemoglobin 15.4 (*)    HCT  46.3 (*)    All other components within normal limits    EKG None  Radiology Ct Head Wo Contrast  Result Date: 08/31/2019 CLINICAL DATA:  Headache, acute EXAM: CT HEAD WITHOUT CONTRAST TECHNIQUE: Contiguous axial images were obtained from the base of the skull through the vertex without intravenous contrast. COMPARISON:  February 14, 2016 FINDINGS: Brain: No evidence of acute territorial infarction, hemorrhage, hydrocephalus,extra-axial collection or mass lesion/mass effect. There is mild dilatation the ventricles and sulci consistent with age-related atrophy. Low-attenuation changes in the deep white matter consistent with small vessel ischemia. Vascular: No hyperdense vessel or unexpected calcification. Skull: The skull is intact. No fracture or focal lesion identified. Sinuses/Orbits: The visualized paranasal sinuses and mastoid air cells are clear. The orbits and globes intact. Other: None IMPRESSION: No acute intracranial abnormality. Findings consistent with mild age related atrophy and chronic small vessel ischemia Electronically Signed   By: Prudencio Pair M.D.   On: 08/31/2019 22:53    Procedures Procedures (including critical care time)  Medications Ordered in ED Medications  sodium chloride flush (NS) 0.9 % injection 3 mL (has no administration in time range)     Initial Impression / Assessment and Plan / ED Course  I have reviewed the triage vital signs and the nursing notes.  Pertinent labs & imaging results that were available during my care of the patient were reviewed by me and considered in my medical decision making (see chart for details).  Patient presenting with complaints of headaches that have been occurring intermittently for the past 3 weeks.  Patient's neurologic exam is nonfocal and head CT is negative.  I see no indication for LP or further work-up.  Patient will be treated with rotating Tylenol and Motrin and follow-up as needed if not improving.  Final Clinical  Impressions(s) / ED Diagnoses   Final diagnoses:  None    ED Discharge  Orders    None       Veryl Speak, MD 09/01/19 [phone]

## 2019-09-01 NOTE — Discharge Instructions (Addendum)
Continue Tylenol 1000 mg rotated with ibuprofen 600 mg every 4 hours as needed for pain.  Return to the emergency department if you develop worsening headache, visual disturbances, or other new and concerning symptoms.

## 2019-09-01 NOTE — Telephone Encounter (Signed)
I called pt and discussed this with her. She is agreeable to pursuing the MRI. Pt verbalized understanding of recommendations. Pt had no questions at this time but was encouraged to call back if questions arise.

## 2019-09-01 NOTE — ED Notes (Signed)
Patient verbalized understanding of dc instructions, vss, ambulatory with nad.   

## 2019-09-01 NOTE — Telephone Encounter (Signed)
Pt called wanting to know if she still has to get the MRI done since they did a CT scan on her yesterday when she went to the ER. She also wants to know if the results can be seen already. Please advise.

## 2019-09-01 NOTE — ED Notes (Signed)
ED Provider at bedside. 

## 2019-09-02 ENCOUNTER — Other Ambulatory Visit: Payer: Medicare Other

## 2019-09-06 ENCOUNTER — Ambulatory Visit: Payer: Medicare Other | Attending: Neurology | Admitting: Physical Therapy

## 2019-09-06 ENCOUNTER — Other Ambulatory Visit: Payer: Self-pay

## 2019-09-06 DIAGNOSIS — R2681 Unsteadiness on feet: Secondary | ICD-10-CM | POA: Diagnosis not present

## 2019-09-06 DIAGNOSIS — Z9181 History of falling: Secondary | ICD-10-CM | POA: Insufficient documentation

## 2019-09-06 NOTE — Therapy (Signed)
[facility] [address] [place], [place], [address] Phone: [phone]   Fax:  [phone]  Physical Therapy Evaluation  Patient Details  Name: Barbara Rangel MRN: [id number] Date of Birth: Jun 08, 1946 Referring Provider (PT): Star Age, MD   Encounter Date: 09/06/2019  PT End of Session - 09/06/19 2207    Visit Number  1    Number of Visits  6    Date for PT Re-Evaluation  11/05/19    Authorization Type  Medicare    PT Start Time  1315    PT Stop Time  1400    PT Time Calculation (min)  45 min    Equipment Utilized During Treatment  Gait belt    Activity Tolerance  Patient tolerated treatment well    Behavior During Therapy  [facility] for tasks assessed/performed       Past Medical History:  Diagnosis Date  . Arthritis    degenerative arthritis  . Cancer ([place])    hx breast cancer-right  . Chronic back pain    right radicular leg pain  . Chronic urinary tract infection    takes Macrodantin and Cranberry daily  . Constipation    r/t pain meds and takes Dulcolax nightly  . FHx: colonic polyps    hx of and mother had colon cancer  . Glaucoma    uses Xalantan nightly  . Hypertension    takes Amlodipine daily  . Other and unspecified general anesthetics causing adverse effect in therapeutic use    hard to wake up  . PONV (postoperative nausea and vomiting)     Past Surgical History:  Procedure Laterality Date  . ANKLE SURGERY  2011   left ankle with screws and plates  . BREAST LUMPECTOMY     x2  . cataract surgery  2005   right eye  . COLONOSCOPY    . COLONOSCOPY WITH PROPOFOL N/A 10/11/2014   Procedure: COLONOSCOPY WITH PROPOFOL;  Surgeon: Garlan Fair, MD;  Location: [facility];  Service: Endoscopy;  Laterality: N/A;  . EYE SURGERY  2004   right eye-detached retina  . left breast biopsy  2009  . LUMBAR LAMINECTOMY/DECOMPRESSION MICRODISCECTOMY  10/31/2011   Procedure: LUMBAR  LAMINECTOMY/DECOMPRESSION MICRODISCECTOMY;  Surgeon: Dahlia Bailiff;  Location: [facility];  Service: Orthopedics;  Laterality: Right;  L4-5 Decompression with Right Facet Decompression   . MASTECTOMY  2005   right  d/t breast cancer;no sticks to right arm LYMPH NODES REMOVED.  Marland Kitchen RETINAL DETACHMENT SURGERY  2004  . right breast biopsy  2005  . TUBAL LIGATION  1980    There were no vitals filed for this visit.   Subjective Assessment - 09/06/19 1318    Subjective  Has fallen twice in the past 11 months - was just walking forwards and fell forward. Was able to get up on her own. Was on the sidewalk. Last fall was approximately at the end of September and hurt her knee. Reports she sometimes feels lightheaded. Walks with her husband every morning at [facility], and that is when the last fall happened. Notices she will drag her feet at times and will tend to widen her BOS when she is walking.Does not report that her legs feel weak. Went to the ED on 10/20 due to complaints of headaches that have been occurring intermittently for the past 3 weeks. CT was negative for any acute abnormality. Not having a headache right now.    Pertinent History  arthritis,  breast cancer, hypertension, hx of lumbar laminectomy at [facility] L4-L5 in 2012    Diagnostic tests  has MRI scheduled 09/19/19    Patient Stated Goals  wants to learn how to prevent falls from happening.    Currently in Pain?  No/denies         [facility] PT Assessment - 09/06/19 1328      Assessment   Medical Diagnosis  history of falls, balance impairment    Referring Provider (PT)  Star Age, MD    Onset Date/Surgical Date  09/05/18   approx a year ago   Prior Therapy  prior physical therapy at [facility] location at       Precautions   Precautions  [place]   Has the patient fallen in the past 6 months  Yes    How many times?  1    Has the patient had a decrease in activity level because of a fear of falling?   No    Is  the patient reluctant to leave their home because of a fear of falling?   No      Home Environment   Living Environment  Private residence    Living Arrangements  Spouse/significant other    Type of [place] to enter    Entrance Stairs-Number of Steps  6    Entrance Stairs-Rails  Right    Home Layout  One level    [place] - single point      Prior Function   Level of Independence  Independent    Leisure  used to do water aerobics everyday, likes traveling       Sensation   Light Touch  Appears Intact      Coordination   Gross Motor Movements are Fluid and Coordinated  Yes    Fine Motor Movements are Fluid and Coordinated  Yes    Finger Nose Finger Test  [facility]    Heel Shin Test  WFL      ROM / Strength   AROM / PROM / Strength  Strength      Strength   Strength Assessment Site  Hip;Knee;Ankle    Right/Left Hip  Right;Left    Right Hip Flexion  5/5    Left Hip Flexion  5/5    Right/Left Knee  Right;Left    Right Knee Flexion  5/5    Right Knee Extension  5/5    Left Knee Flexion  5/5    Left Knee Extension  5/5    Right/Left Ankle  Right;Left    Right Ankle Dorsiflexion  5/5    Left Ankle Dorsiflexion  5/5    Left Ankle Plantar Flexion  5/5      Ambulation/Gait   Ambulation/Gait  Yes    Ambulation/Gait Assistance  6: Modified independent (Device/Increase time);5: Supervision    Ambulation/Gait Assistance Details  No episodes of pt's R foot getting stuck during gait today.    Ambulation Distance (Feet)  250 Feet    Assistive device  None    Gait Pattern  Within Functional Limits    Ambulation Surface  Level;Indoor;Unlevel;Outdoor    Gait velocity  8.91 seconds = 3.68 ft/sec    Gait Comments  reports sometimes she feels like her right foot gets stuck when she is walking       High Level Balance   High Level Balance Comments  mCTSIB:  condition 3: 30 seconds, condition 4: 30 seconds with increased postural sway. SLS: able to hold  it approx. 5 seconds bilaterally      Functional Gait  Assessment   Gait assessed   Yes    Gait Level Surface  Walks 20 ft in less than 5.5 sec, no assistive devices, good speed, no evidence for imbalance, normal gait pattern, deviates no more than 6 in outside of the 12 in walkway width.    Change in Gait Speed  Able to smoothly change walking speed without loss of balance or gait deviation. Deviate no more than 6 in outside of the 12 in walkway width.    Gait with Horizontal Head Turns  Performs head turns smoothly with no change in gait. Deviates no more than 6 in outside 12 in walkway width    Gait with Vertical Head Turns  Performs head turns with no change in gait. Deviates no more than 6 in outside 12 in walkway width.    Gait and Pivot Turn  Pivot turns safely within 3 sec and stops quickly with no loss of balance.    Step Over Obstacle  Is able to step over one shoe box (4.5 in total height) without changing gait speed. No evidence of imbalance.    Gait with Narrow Base of Support  Ambulates less than 4 steps heel to toe or cannot perform without assistance.    Gait with Eyes Closed  Walks 20 ft, slow speed, abnormal gait pattern, evidence for imbalance, deviates 10-15 in outside 12 in walkway width. Requires more than 9 sec to ambulate 20 ft.   13.5 seonds   Ambulating Backwards  Walks 20 ft, slow speed, abnormal gait pattern, evidence for imbalance, deviates 10-15 in outside 12 in walkway width.   29.06 seconds   Steps  Alternating feet, must use rail.    Total Score  21    FGA comment:  21/30                 Objective measurements completed on examination: See above findings.              PT Education - 09/06/19 2207    Education Details  clinical findings, POC    Person(s) Educated  Patient    Methods  Explanation    Comprehension  Verbalized understanding       PT Short Term Goals - 09/07/19 0936      PT SHORT TERM GOAL #1   Title  Patient will be  independent with initial HEP in order to build upon functional gains made in therapy. ALL STGS DUE 09/28/19    Time  3    Period  Weeks    Status  New    Target Date  09/28/19      PT SHORT TERM GOAL #2   Title  Patient will improve FGA score to at least a 24/30 in order to demo decreased fall risk.    Baseline  21/30    Time  3    Period  Weeks    Status  New       PT Short Term Goals - 09/07/19 0936      PT SHORT TERM GOAL #1   Title  Patient will be independent with initial HEP in order to build upon functional gains made in therapy. ALL STGS DUE 09/28/19    Time  3    Period  Weeks    Status  New  Target Date  09/28/19      PT SHORT TERM GOAL #2   Title  Patient will improve FGA score to at least a 24/30 in order to demo decreased fall risk.    Baseline  21/30    Time  3    Period  Weeks    Status  New       PT Long Term Goals - 09/07/19 [id number]      PT LONG TERM GOAL #1   Title  Patient will be independent with final HEP in order to build upon functional gains made in therapy. ALL LTGS DUE 10/19/19    Time  6    Period  Weeks    Status  New    Target Date  10/19/19      PT LONG TERM GOAL #2   Title  Patient will improve FGA score to at least a 27/30 in order to demo decreased fall risk.    Baseline  21/30    Time  6    Period  Weeks    Status  New      PT LONG TERM GOAL #3   Title  Patient will ambulate 1,000 feet outdoors on unlevel surfaces such as pavement and grass with mod I while scanning environment with no LOB in order to improve community mobility.    Time  6    Period  Weeks    Status  New      PT LONG TERM GOAL #4   Title  Patient will improve SLS time on BLE to at least 10 seconds to improve static standing balance for gait and ADLs.    Baseline  5 seconds B    Time  6    Period  Weeks    Status  New             Plan - 09/06/19 2208    Clinical Impression Statement  Patient is a 73 year old female referred to Neuro OPPT for  evaluation with primary concern of a recent fall in the past month and balance impairments. Pt's PMH is significant for: arthritis, breast cancer, hypertension.    The following deficits were present during the exam: impaired balance with eyes closed, SLS, tandem stance, increased postural sway on CTSIB condition 4, and decreased gait speed with backwards walking.  Pt's FGA scores indicate pt is at a moderate risk for falls.  Pt would benefit from skilled PT to address these impairments and functional limitations to maximize functional mobility independence.    Personal Factors and Comorbidities  Time since onset of injury/illness/exacerbation;Comorbidity 3+;Past/Current Experience    Comorbidities  arthritis, breast cancer, hypertension.    Examination-Activity Limitations  Locomotion Level    Examination-Participation Restrictions  Community Activity    Stability/Clinical Decision Making  Stable/Uncomplicated    Clinical Decision Making  Low    Rehab Potential  Good    PT Frequency  1x / week    PT Duration  6 weeks    PT Treatment/Interventions  ADLs/Self Care Home Management;Aquatic Therapy;Therapeutic exercise;Therapeutic activities;Stair training;Gait training;Balance training;Neuromuscular re-education;Patient/family education    PT Next Visit Plan  Initial HEP for balance - corner balance for vestibular system, SLS, and tandem.       Patient will benefit from skilled therapeutic intervention in order to improve the following deficits and impairments:  Decreased balance, Difficulty walking  Visit Diagnosis: History of falling  Unsteadiness on feet     Problem List Patient Active Problem List  Diagnosis Date Noted  . Neck pain 03/30/2018  . Osteopenia determined by x-ray 08/28/2016  . History of right breast cancer 08/28/2016  . Malignant neoplasm of right female breast ([place]) 01/23/2016  . History of chemotherapy 01/23/2016  . High risk medication use 07/29/2015  . Screening  mammogram for high-risk patient 07/29/2015  . Estrogen deficiency 07/29/2015  . 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/24/2015  . Dense breast tissue 01/24/2015  . Hx of adenomatous colonic polyps 01/24/2015  . Plantar fasciitis, bilateral 01/24/2015  . Breast cancer, right breast ([place]) 07/15/2013  . Syncope 09/15/2012  . Hypokalemia 09/15/2012  . Chronic back pain   . Chronic urinary tract infection   . Hypertension   . Arthritis   . Synovial cyst of lumbar facet joint 11/01/2011    Arliss Journey, PT, DPT  09/07/2019, 9:40 AM  [facility] [address] [place], [place], [address] Phone: [phone]   Fax:  [phone]  Name: GRACIANA NOTHDURFT MRN: [id number] Date of Birth: 04-28-1946

## 2019-09-10 ENCOUNTER — Ambulatory Visit
Admission: RE | Admit: 2019-09-10 | Discharge: 2019-09-10 | Disposition: A | Discharge status: Home / Self Care | Payer: Medicare Other | Source: Ambulatory Visit | Attending: Neurology | Admitting: Neurology

## 2019-09-10 ENCOUNTER — Other Ambulatory Visit: Payer: Self-pay

## 2019-09-10 DIAGNOSIS — R413 Other amnesia: Secondary | ICD-10-CM

## 2019-09-10 MED ORDER — GADOBENATE DIMEGLUMINE 529 MG/ML IV SOLN
12.0000 mL | Freq: Once | INTRAVENOUS | Status: AC | PRN
  Administered 2019-09-10: 12 mL via INTRAVENOUS

## 2019-09-13 ENCOUNTER — Encounter: Payer: Medicare Other | Admitting: Psychology

## 2019-09-13 ENCOUNTER — Encounter: Payer: Self-pay | Admitting: Physical Therapy

## 2019-09-13 ENCOUNTER — Other Ambulatory Visit: Payer: Self-pay

## 2019-09-13 ENCOUNTER — Ambulatory Visit: Payer: Medicare Other | Attending: Neurology | Admitting: Physical Therapy

## 2019-09-13 DIAGNOSIS — Z9181 History of falling: Secondary | ICD-10-CM

## 2019-09-13 DIAGNOSIS — R2681 Unsteadiness on feet: Secondary | ICD-10-CM

## 2019-09-13 NOTE — Therapy (Signed)
[facility] [address] [place], [place], [address] Phone: [phone]   Fax:  [phone]  Physical Therapy Treatment  Patient Details  Name: Barbara Rangel MRN: [id number] Date of Birth: 10-21-1946 Referring Provider (PT): Star Age, MD   Encounter Date: 09/13/2019  PT End of Session - 09/13/19 1946    Visit Number  2    Number of Visits  6    Date for PT Re-Evaluation  11/05/19    Authorization Type  Medicare    PT Start Time  1400    PT Stop Time  1445    PT Time Calculation (min)  45 min    Equipment Utilized During Treatment  Gait belt    Activity Tolerance  Patient tolerated treatment well    Behavior During Therapy  [facility] for tasks assessed/performed       Past Medical History:  Diagnosis Date  . Arthritis    degenerative arthritis  . Cancer ([place])    hx breast cancer-right  . Chronic back pain    right radicular leg pain  . Chronic urinary tract infection    takes Macrodantin and Cranberry daily  . Constipation    r/t pain meds and takes Dulcolax nightly  . FHx: colonic polyps    hx of and mother had colon cancer  . Glaucoma    uses Xalantan nightly  . Hypertension    takes Amlodipine daily  . Other and unspecified general anesthetics causing adverse effect in therapeutic use    hard to wake up  . PONV (postoperative nausea and vomiting)     Past Surgical History:  Procedure Laterality Date  . ANKLE SURGERY  2011   left ankle with screws and plates  . BREAST LUMPECTOMY     x2  . cataract surgery  2005   right eye  . COLONOSCOPY    . COLONOSCOPY WITH PROPOFOL N/A 10/11/2014   Procedure: COLONOSCOPY WITH PROPOFOL;  Surgeon: Garlan Fair, MD;  Location: [facility];  Service: Endoscopy;  Laterality: N/A;  . EYE SURGERY  2004   right eye-detached retina  . left breast biopsy  2009  . LUMBAR LAMINECTOMY/DECOMPRESSION MICRODISCECTOMY  10/31/2011   Procedure: LUMBAR  LAMINECTOMY/DECOMPRESSION MICRODISCECTOMY;  Surgeon: Dahlia Bailiff;  Location: [facility];  Service: Orthopedics;  Laterality: Right;  L4-5 Decompression with Right Facet Decompression   . MASTECTOMY  2005   right  d/t breast cancer;no sticks to right arm LYMPH NODES REMOVED.  Marland Kitchen RETINAL DETACHMENT SURGERY  2004  . right breast biopsy  2005  . TUBAL LIGATION  1980    There were no vitals filed for this visit.  Subjective Assessment - 09/13/19 1403    Subjective  Went to get her MRI of her brain done last Friday - awaiting to hear results back from MD. No falls.    Pertinent History  arthritis, breast cancer, hypertension, hx of lumbar laminectomy at R L4-L5 in 2012    Diagnostic tests  has MRI scheduled 09/19/19    Patient Stated Goals  wants to learn how to prevent falls from happening.    Currently in Pain?  No/denies                 Access Code: [email or web address]  URL: [email or web address]  Date: 09/13/2019  Prepared by: Janann August   Initiated HEP for balance:   Exercises Tandem Walking with Counter Support - 3 sets - 2x daily - 7x weekly Heel Walking - 3  sets - 2x daily - 7x weekly Toe Walking - 3 sets - 2x daily - 7x weekly Single Leg Stance with Support - 3 sets - 2x daily - 7x weekly Romberg Stance Eyes Closed on Foam Pad - 10 reps - 2 sets - 2x daily - 7x weekly - on 2 pillows, 2 x 10 head nods, 2 x 10 head turns  Standing Romberg to 1/2 Tandem Stance - 3 sets - 20 hold - 2x daily - 7x weekly - on 2 pillows, eyes closed.       [facility] PT Treatment/Exercise - 09/13/19 1951      High Level Balance   High Level Balance Comments  On blue foam balance beam: tandem walking down and back 3 reps with intermittent UE support, on blue floor mat folded over: 1 x 10 reps SLS taps onto 8" step, followed by multiple reps SLS taps to 3 colorful bubbles on floor with intermittent UE support, pt tends to perform quickly rather than with control.               PT Education - 09/13/19 1941    Education Details  initial HEP for balance    Person(s) Educated  Patient    Methods  Explanation;Demonstration;Handout    Comprehension  Verbalized understanding;Returned demonstration       PT Short Term Goals - 09/07/19 0936      PT SHORT TERM GOAL #1   Title  Patient will be independent with initial HEP in order to build upon functional gains made in therapy. ALL STGS DUE 09/28/19    Time  3    Period  Weeks    Status  New    Target Date  09/28/19      PT SHORT TERM GOAL #2   Title  Patient will improve FGA score to at least a 24/30 in order to demo decreased fall risk.    Baseline  21/30    Time  3    Period  Weeks    Status  New        PT Long Term Goals - 09/07/19 [id number]      PT LONG TERM GOAL #1   Title  Patient will be independent with final HEP in order to build upon functional gains made in therapy. ALL LTGS DUE 10/19/19    Time  6    Period  Weeks    Status  New    Target Date  10/19/19      PT LONG TERM GOAL #2   Title  Patient will improve FGA score to at least a 27/30 in order to demo decreased fall risk.    Baseline  21/30    Time  6    Period  Weeks    Status  New      PT LONG TERM GOAL #3   Title  Patient will ambulate 1,000 feet outdoors on unlevel surfaces such as pavement and grass with mod I while scanning environment with no LOB in order to improve community mobility.    Time  6    Period  Weeks    Status  New      PT LONG TERM GOAL #4   Title  Patient will improve SLS time on BLE to at least 10 seconds to improve static standing balance for gait and ADLs.    Baseline  5 seconds B    Time  6    Period  Weeks  Status  New            Plan - 09/13/19 1948    Clinical Impression Statement  Today's skilled session focused on initiating HEP for balance at countertop and in corner. Pt demonstrates increased difficulty performing SLS on R, unable to hold >5 seconds without single fingertip  support at countertop. Will continue to progress towards LTGs.    Personal Factors and Comorbidities  Time since onset of injury/illness/exacerbation;Comorbidity 3+;Past/Current Experience    Comorbidities  arthritis, breast cancer, hypertension.    Examination-Activity Limitations  Locomotion Level    Examination-Participation Restrictions  Community Activity    Stability/Clinical Decision Making  Stable/Uncomplicated    Rehab Potential  Good    PT Frequency  1x / week    PT Duration  6 weeks    PT Treatment/Interventions  ADLs/Self Care Home Management;Aquatic Therapy;Therapeutic exercise;Therapeutic activities;Stair training;Gait training;Balance training;Neuromuscular re-education;Patient/family education    PT Next Visit Plan  high level balance, especially SLS on R.    PT Home Exercise Plan  [id number]       Patient will benefit from skilled therapeutic intervention in order to improve the following deficits and impairments:  Decreased balance, Difficulty walking  Visit Diagnosis: History of falling  Unsteadiness on feet     Problem List Patient Active Problem List   Diagnosis Date Noted  . Neck pain 03/30/2018  . Osteopenia determined by x-ray 08/28/2016  . History of right breast cancer 08/28/2016  . Malignant neoplasm of right female breast ([place]) 01/23/2016  . History of chemotherapy 01/23/2016  . High risk medication use 07/29/2015  . Screening mammogram for high-risk patient 07/29/2015  . Estrogen deficiency 07/29/2015  . 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/24/2015  . Dense breast tissue 01/24/2015  . Hx of adenomatous colonic polyps 01/24/2015  . Plantar fasciitis, bilateral 01/24/2015  . Breast cancer, right breast ([place]) 07/15/2013  . Syncope 09/15/2012  . Hypokalemia 09/15/2012  . Chronic back pain   . Chronic urinary tract infection   . Hypertension   . Arthritis   . Synovial cyst of lumbar facet joint 11/01/2011    Barbara Rangel, PT, DPT   09/13/2019, 7:51 PM  [facility] [address] [place], [place], [address] Phone: [phone]   Fax:  [phone]  Name: Barbara Rangel MRN: [id number] Date of Birth: Mar 27, 1946

## 2019-09-13 NOTE — Progress Notes (Signed)
Please call patient regarding the recent brain MRI: The brain scan showed a normal structure of the brain and mild volume loss which we call atrophy. This is reportedly slightly progressed since 2017 which is expected. There were changes in the deeper structures of the brain, which we call white matter changes or microvascular changes. These were reported as mild in Her case and also mildly progressed since 2017. These are tiny white spots, that occur with time and are seen in a variety of conditions, including with normal aging, chronic hypertension, chronic headaches, especially migraine HAs, chronic diabetes, chronic hyperlipidemia. These are not strokes and no mass or lesion or contrast enhancement was seen which is reassuring. Again, there were no acute findings, such as a stroke, or mass or blood products. No further action is required on this test at this time, other than re-enforcing the importance of good blood pressure control, good cholesterol control, good blood sugar control, and weight management. Please remind patient to keep any upcoming appointments or tests and to call us with any interim questions, concerns, problems or updates.  Proceed with neuropsychology appointment with Dr. Hazle Coca this week.  Thanks,  Star Age, MD, PhD

## 2019-09-13 NOTE — Patient Instructions (Signed)
Access Code: [email or web address]  URL: [email or web address]  Date: 09/13/2019  Prepared by: Janann August   Exercises Tandem Walking with Counter Support - 3 sets - 2x daily - 7x weekly Heel Walking - 3 sets - 2x daily - 7x weekly Toe Walking - 3 sets - 2x daily - 7x weekly Single Leg Stance with Support - 3 sets - 2x daily - 7x weekly Romberg Stance Eyes Closed on Foam Pad - 10 reps - 2 sets - 2x daily - 7x weekly Standing Romberg to 1/2 Tandem Stance - 3 sets - 20 hold - 2x daily - 7x weekly

## 2019-09-15 ENCOUNTER — Ambulatory Visit: Payer: Medicare Other | Admitting: Psychology

## 2019-09-15 ENCOUNTER — Ambulatory Visit (INDEPENDENT_AMBULATORY_CARE_PROVIDER_SITE_OTHER): Payer: Medicare Other | Admitting: Psychology

## 2019-09-15 ENCOUNTER — Other Ambulatory Visit: Payer: Self-pay

## 2019-09-15 ENCOUNTER — Telehealth: Payer: Self-pay

## 2019-09-15 ENCOUNTER — Encounter: Payer: Self-pay | Admitting: Psychology

## 2019-09-15 DIAGNOSIS — Z9221 Personal history of antineoplastic chemotherapy: Secondary | ICD-10-CM | POA: Diagnosis not present

## 2019-09-15 DIAGNOSIS — R419 Unspecified symptoms and signs involving cognitive functions and awareness: Secondary | ICD-10-CM

## 2019-09-15 DIAGNOSIS — R4189 Other symptoms and signs involving cognitive functions and awareness: Secondary | ICD-10-CM | POA: Diagnosis not present

## 2019-09-15 NOTE — Telephone Encounter (Signed)
-----   Message from Star Age, MD sent at 09/13/2019  6:43 PM EST ----- Please call patient regarding the recent brain MRI: The brain scan showed a normal structure of the brain and mild volume loss which we call atrophy. This is reportedly slightly progressed since 2017 which is expected. There were changes in the deeper structures of the brain, which we call white matter changes or microvascular changes. These were reported as mild in Her case and also mildly progressed since 2017. These are tiny white spots, that occur with time and are seen in a variety of conditions, including with normal aging, chronic hypertension, chronic headaches, especially migraine HAs, chronic diabetes, chronic hyperlipidemia. These are not strokes and no mass or lesion or contrast enhancement was seen which is reassuring. Again, there were no acute findings, such as a stroke, or mass or blood products. No further action is required on this test at this time, other than re-enforcing the importance of good blood pressure control, good cholesterol control, good blood sugar control, and weight management. Please remind patient to keep any upcoming appointments or tests and to call us with any interim questions, concerns, problems or updates.  Proceed with neuropsychology appointment with Dr. Hazle Coca this week.  Thanks,  Star Age, MD, PhD

## 2019-09-15 NOTE — Telephone Encounter (Signed)
Pt returning call please call back °

## 2019-09-15 NOTE — Progress Notes (Signed)
   Neuropsychology Note   Barbara Rangel completed 128 minutes of neuropsychological testing with technician, Milana Kidney, B.S., under the supervision of Dr. Christia Reading, Ph.D., licensed neuropsychologist. The patient did not appear overtly distressed by the testing session, per behavioral observation or via self-report to the technician. Rest breaks were offered.    In considering the patient's current level of functioning, level of presumed impairment, nature of symptoms, emotional and behavioral responses during the interview, level of literacy, and observed level of motivation/effort, a battery of tests was selected and communicated to the psychometrician.   Communication between the psychologist and technician was ongoing throughout the testing session and changes were made as deemed necessary based on patient performance on testing, technician observations and additional pertinent factors such as those listed above.   Barbara Rangel will return within approximately two weeks for an interactive feedback session with Dr. Melvyn Novas at which time his test performances, clinical impressions, and treatment recommendations will be reviewed in detail. The patient understands she can contact our office should she require our assistance before this time.   Full report to follow.  128 minutes were spent face-to-face with patient administering standardized tests and 15 minutes were spent scoring (technician). [CPT [id number], [id number]

## 2019-09-15 NOTE — Progress Notes (Signed)
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 interpretation and report writing, billed as one unit 96132 and two units 96133.

## 2019-09-15 NOTE — Telephone Encounter (Signed)
I called pt to discuss her MRI results. No answer, left a message asking him to call me back.

## 2019-09-15 NOTE — Telephone Encounter (Signed)
I called pt and discussed her MRI results and recommendations. Pt verbalized understanding of results and recommendations. Pt had no questions at this time but was encouraged to call back if questions arise.  

## 2019-09-16 ENCOUNTER — Encounter: Payer: Self-pay | Admitting: Psychology

## 2019-09-19 ENCOUNTER — Other Ambulatory Visit: Payer: Medicare Other

## 2019-09-20 ENCOUNTER — Encounter: Payer: Self-pay | Admitting: Physical Therapy

## 2019-09-20 ENCOUNTER — Other Ambulatory Visit: Payer: Self-pay

## 2019-09-20 ENCOUNTER — Ambulatory Visit: Payer: Medicare Other | Admitting: Physical Therapy

## 2019-09-20 DIAGNOSIS — Z9181 History of falling: Secondary | ICD-10-CM | POA: Diagnosis not present

## 2019-09-20 DIAGNOSIS — R2681 Unsteadiness on feet: Secondary | ICD-10-CM | POA: Diagnosis not present

## 2019-09-20 NOTE — Therapy (Signed)
[facility] [address] [place], [place], [address] Phone: [phone]   Fax:  [phone]  Physical Therapy Treatment  Patient Details  Name: Barbara Rangel MRN: [id number] Date of Birth: 1946/05/31 Referring Provider (PT): Star Age, MD   Encounter Date: 09/20/2019  PT End of Session - 09/20/19 2047    Visit Number  3    Number of Visits  6    Date for PT Re-Evaluation  11/05/19    Authorization Type  Medicare    PT Start Time  1232    PT Stop Time  1314    PT Time Calculation (min)  42 min    Equipment Utilized During Treatment  Gait belt    Activity Tolerance  Patient tolerated treatment well    Behavior During Therapy  [facility] for tasks assessed/performed       Past Medical History:  Diagnosis Date  . Arthritis    degenerative arthritis  . Breast cancer, right breast ([place]) 04/2004   T2N1 diagnosed June 2005, ER PR + and Her 2 negative, post mastectomy with axillary node dissection (possibly 9 or 11 nodes removed, with possibly 7 or 9 involved) then treatment on NSABP B28 with dose dense adriamycin cytoxan followed by taxol, all of this Rx  in [place]. Adjuvant arimidex begun Jan 2006. No radiation.   . Chronic back pain    right radicular leg pain  . Chronic urinary tract infection    takes Macrodantin and Cranberry daily  . Constipation    r/t pain meds and takes Dulcolax nightly  . Glaucoma    uses Xalantan nightly  . Hypertension    takes Amlodipine daily  . Hypokalemia 09/15/2012  . 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/24/2015  . Other and unspecified general anesthetics causing adverse effect in therapeutic use    hard to wake up  . PONV (postoperative nausea and vomiting)   . Syncope 09/15/2012    Past Surgical History:  Procedure Laterality Date  . ANKLE SURGERY  2011   left ankle with screws and plates  . BREAST LUMPECTOMY     x2  . cataract surgery  2005   right eye  .  COLONOSCOPY    . COLONOSCOPY WITH PROPOFOL N/A 10/11/2014   Procedure: COLONOSCOPY WITH PROPOFOL;  Surgeon: Garlan Fair, MD;  Location: [facility];  Service: Endoscopy;  Laterality: N/A;  . EYE SURGERY  2004   right eye-detached retina  . left breast biopsy  2009  . LUMBAR LAMINECTOMY/DECOMPRESSION MICRODISCECTOMY  10/31/2011   Procedure: LUMBAR LAMINECTOMY/DECOMPRESSION MICRODISCECTOMY;  Surgeon: Dahlia Bailiff;  Location: [facility];  Service: Orthopedics;  Laterality: Right;  L4-5 Decompression with Right Facet Decompression   . MASTECTOMY  2005   right  d/t breast cancer;no sticks to right arm LYMPH NODES REMOVED.  Marland Kitchen RETINAL DETACHMENT SURGERY  2004  . right breast biopsy  2005  . TUBAL LIGATION  1980    There were no vitals filed for this visit.  Subjective Assessment - 09/20/19 1235    Subjective  Has been doing her exercises since last week - some are easier than others, especially any on her right side. She recently got her MRI results and no acute findings. No falls.    Pertinent History  arthritis, breast cancer, hypertension, hx of lumbar laminectomy at R L4-L5 in 2012    Diagnostic tests  MRI from 10/30 of brain - no acute findings.    Patient Stated Goals  wants to learn how to prevent falls from happening.    Currently in Pain?  No/denies                           NMR: On blue foam in // bars:  -feet together eyes closed 3 x 30 seconds, narrower BOS eyes closed 3 x 5 reps vertical head nods, 3 x 5 reps horizontal turns -SLS taps, massed practice B, tapping 2 colorful bubbles on floor progressing to 2 bubbles and 1 higher cone On rockerboard:  -1 x 15 reps A/P weight shifts w/ intermittent UE support  -4 x 20 seconds feet apart eyes closed on rockerboard with static balance -1 x 10 reps M/L weight shifting no UE support -holding rocker board steady multi-directional reaching outside of BOS to tap cone x10 reps   -Lateral stepping over 4 hurdles  with supervision, down and back 2 reps -Forward alternating stepping over 4 hurdles, down and back 2 reps, pt with increased difficulty with SLS on R       PT Short Term Goals - 09/07/19 0936      PT SHORT TERM GOAL #1   Title  Patient will be independent with initial HEP in order to build upon functional gains made in therapy. ALL STGS DUE 09/28/19    Time  3    Period  Weeks    Status  New    Target Date  09/28/19      PT SHORT TERM GOAL #2   Title  Patient will improve FGA score to at least a 24/30 in order to demo decreased fall risk.    Baseline  21/30    Time  3    Period  Weeks    Status  New        PT Long Term Goals - 09/07/19 [id number]      PT LONG TERM GOAL #1   Title  Patient will be independent with final HEP in order to build upon functional gains made in therapy. ALL LTGS DUE 10/19/19    Time  6    Period  Weeks    Status  New    Target Date  10/19/19      PT LONG TERM GOAL #2   Title  Patient will improve FGA score to at least a 27/30 in order to demo decreased fall risk.    Baseline  21/30    Time  6    Period  Weeks    Status  New      PT LONG TERM GOAL #3   Title  Patient will ambulate 1,000 feet outdoors on unlevel surfaces such as pavement and grass with mod I while scanning environment with no LOB in order to improve community mobility.    Time  6    Period  Weeks    Status  New      PT LONG TERM GOAL #4   Title  Patient will improve SLS time on BLE to at least 10 seconds to improve static standing balance for gait and ADLs.    Baseline  5 seconds B    Time  6    Period  Weeks    Status  New            Plan - 09/20/19 2048    Clinical Impression Statement  Focus of today's skilled session was balance reactions on compliant surfaces and SLS. Pt continues to have difficulties  with SLS on RLE. Unable to perform feet together eyes closed on foam for 30 seconds, indicating decreased vestibular input for balance. Will continue to progress  towards LTGs.    Personal Factors and Comorbidities  Time since onset of injury/illness/exacerbation;Comorbidity 3+;Past/Current Experience    Comorbidities  arthritis, breast cancer, hypertension.    Examination-Activity Limitations  Locomotion Level    Examination-Participation Restrictions  Community Activity    Stability/Clinical Decision Making  Stable/Uncomplicated    Rehab Potential  Good    PT Frequency  1x / week    PT Duration  6 weeks    PT Treatment/Interventions  ADLs/Self Care Home Management;Aquatic Therapy;Therapeutic exercise;Therapeutic activities;Stair training;Gait training;Balance training;Neuromuscular re-education;Patient/family education    PT Next Visit Plan  high level balance, especially SLS on R. vestibular system for balance - eyes closed    PT Home Exercise Plan  [email or web address]       Patient will benefit from skilled therapeutic intervention in order to improve the following deficits and impairments:  Decreased balance, Difficulty walking  Visit Diagnosis: History of falling  Unsteadiness on feet     Problem List Patient Active Problem List   Diagnosis Date Noted  . Neck pain 03/30/2018  . Osteopenia determined by x-ray 08/28/2016  . History of right breast cancer 08/28/2016  . Malignant neoplasm of right female breast ([place]) 01/23/2016  . History of chemotherapy 01/23/2016  . Estrogen deficiency 07/29/2015  . 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/24/2015  . Dense breast tissue 01/24/2015  . Hx of adenomatous colonic polyps 01/24/2015  . Plantar fasciitis, bilateral 01/24/2015  . Breast cancer, right breast ([place]) 07/15/2013  . Syncope 09/15/2012  . Hypokalemia 09/15/2012  . Chronic back pain   . Chronic urinary tract infection   . Hypertension   . Arthritis   . Synovial cyst of lumbar facet joint 11/01/2011    Arliss Journey, PT, DPT  09/20/2019, 8:54 PM  [facility] [address] [place], [place], [address] Phone: [phone]   Fax:  [phone]  Name: Barbara Rangel MRN: [id number] Date of Birth: 05/10/1946

## 2019-09-28 ENCOUNTER — Other Ambulatory Visit: Payer: Self-pay

## 2019-09-28 ENCOUNTER — Ambulatory Visit (INDEPENDENT_AMBULATORY_CARE_PROVIDER_SITE_OTHER): Payer: Medicare Other | Admitting: Psychology

## 2019-09-28 ENCOUNTER — Encounter: Payer: Self-pay | Admitting: Psychology

## 2019-09-28 DIAGNOSIS — Z9221 Personal history of antineoplastic chemotherapy: Secondary | ICD-10-CM

## 2019-09-28 DIAGNOSIS — R4189 Other symptoms and signs involving cognitive functions and awareness: Secondary | ICD-10-CM

## 2019-09-28 NOTE — Progress Notes (Signed)
   Neuropsychology Feedback Session [name]. [facility] Department of Neurology  Reason for Referral:   Barbara Rangel a 73 y.o. African-American female referred by Star Age, M.D.,to characterize hercurrent cognitive functioning and assist with diagnostic clarity and treatment planning in the context of subjective cognitive dysfunction and a history of breast cancer and chemotherapy treatment.  Feedback:   [name] completed a comprehensive neuropsychological evaluation on 09/15/2019. Please refer to that encounter for the full report. Briefly, results suggested neuropsychological functioning within normal limits. An isolated weakness was observed across phonemic fluency, consistent with what had been previously exhibited across her 2 prior neuropsychological evaluations in 2014 and 2017. Overall, results are quite stable with these previous evaluations and do not suggest evidence for cognitive decline.  [name] was unaccompanied. Content of the current session focused on the results of her evaluation, and the potential effects of prior chemotherapy treatment on cognitive functioning. [name] was given the opportunity to ask questions and her questions were answered. she was also encouraged to reach out should additional questions arise. A copy of her report was provided at the conclusion of the visit.      A total of 25 minutes were spent with [name] during the current feedback session.

## 2019-09-29 ENCOUNTER — Ambulatory Visit: Payer: Medicare Other | Admitting: Physical Therapy

## 2019-10-04 ENCOUNTER — Ambulatory Visit: Payer: Medicare Other | Admitting: Physical Therapy

## 2019-10-11 ENCOUNTER — Ambulatory Visit: Payer: Medicare Other | Admitting: Physical Therapy

## 2019-10-11 ENCOUNTER — Other Ambulatory Visit: Payer: Self-pay

## 2019-10-11 ENCOUNTER — Encounter: Payer: Self-pay | Admitting: Physical Therapy

## 2019-10-11 DIAGNOSIS — R2681 Unsteadiness on feet: Secondary | ICD-10-CM | POA: Diagnosis not present

## 2019-10-11 DIAGNOSIS — Z9181 History of falling: Secondary | ICD-10-CM

## 2019-10-11 NOTE — Therapy (Signed)
[facility] [address] [place], [place], [address] Phone: [phone]   Fax:  [phone]  Physical Therapy Treatment  Patient Details  Name: Barbara Rangel MRN: [id number] Date of Birth: 1946-02-17 Referring Provider (PT): Star Age, MD   Encounter Date: 10/11/2019  PT End of Session - 10/11/19 1625    Visit Number  4    Number of Visits  6    Date for PT Re-Evaluation  11/05/19    Authorization Type  Medicare    PT Start Time  1315    PT Stop Time  1401    PT Time Calculation (min)  46 min    Equipment Utilized During Treatment  Gait belt    Activity Tolerance  Patient tolerated treatment well    Behavior During Therapy  [facility] for tasks assessed/performed       Past Medical History:  Diagnosis Date  . Arthritis    degenerative arthritis  . Breast cancer, right breast ([place]) 04/2004   T2N1 diagnosed June 2005, ER PR + and Her 2 negative, post mastectomy with axillary node dissection (possibly 9 or 11 nodes removed, with possibly 7 or 9 involved) then treatment on NSABP B28 with dose dense adriamycin cytoxan followed by taxol, all of this Rx  in [place]. Adjuvant arimidex begun Jan 2006. No radiation.   . Chronic back pain    right radicular leg pain  . Chronic urinary tract infection    takes Macrodantin and Cranberry daily  . Constipation    r/t pain meds and takes Dulcolax nightly  . Glaucoma    uses Xalantan nightly  . Hypertension    takes Amlodipine daily  . Hypokalemia 09/15/2012  . 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/24/2015  . Other and unspecified general anesthetics causing adverse effect in therapeutic use    hard to wake up  . PONV (postoperative nausea and vomiting)   . Syncope 09/15/2012    Past Surgical History:  Procedure Laterality Date  . ANKLE SURGERY  2011   left ankle with screws and plates  . BREAST LUMPECTOMY     x2  . cataract surgery  2005   right eye  .  COLONOSCOPY    . COLONOSCOPY WITH PROPOFOL N/A 10/11/2014   Procedure: COLONOSCOPY WITH PROPOFOL;  Surgeon: Garlan Fair, MD;  Location: [facility];  Service: Endoscopy;  Laterality: N/A;  . EYE SURGERY  2004   right eye-detached retina  . left breast biopsy  2009  . LUMBAR LAMINECTOMY/DECOMPRESSION MICRODISCECTOMY  10/31/2011   Procedure: LUMBAR LAMINECTOMY/DECOMPRESSION MICRODISCECTOMY;  Surgeon: Dahlia Bailiff;  Location: [facility];  Service: Orthopedics;  Laterality: Right;  L4-5 Decompression with Right Facet Decompression   . MASTECTOMY  2005   right  d/t breast cancer;no sticks to right arm LYMPH NODES REMOVED.  Marland Kitchen RETINAL DETACHMENT SURGERY  2004  . right breast biopsy  2005  . TUBAL LIGATION  1980    There were no vitals filed for this visit.  Subjective Assessment - 10/11/19 1317    Subjective  No falls. Still having difficulty with tandem stance. Is noticing some improvement while standing on the pillows.    Pertinent History  arthritis, breast cancer, hypertension, hx of lumbar laminectomy at R L4-L5 in 2012    Diagnostic tests  MRI from 10/30 of brain - no acute findings.    Patient Stated Goals  wants to learn how to prevent falls from happening.    Currently in Pain?  No/denies         [facility] PT Assessment - 10/11/19 1319      Functional Gait  Assessment   Gait assessed   Yes    Gait Level Surface  Walks 20 ft in less than 5.5 sec, no assistive devices, good speed, no evidence for imbalance, normal gait pattern, deviates no more than 6 in outside of the 12 in walkway width.    Change in Gait Speed  Able to smoothly change walking speed without loss of balance or gait deviation. Deviate no more than 6 in outside of the 12 in walkway width.    Gait with Horizontal Head Turns  Performs head turns smoothly with no change in gait. Deviates no more than 6 in outside 12 in walkway width    Gait with Vertical Head Turns  Performs head turns with no change in gait. Deviates no  more than 6 in outside 12 in walkway width.    Gait and Pivot Turn  Pivot turns safely within 3 sec and stops quickly with no loss of balance.    Step Over Obstacle  Is able to step over 2 stacked shoe boxes taped together (9 in total height) without changing gait speed. No evidence of imbalance.    Gait with Narrow Base of Support  Is able to ambulate for 10 steps heel to toe with no staggering.    Gait with Eyes Closed  Walks 20 ft, slow speed, abnormal gait pattern, evidence for imbalance, deviates 10-15 in outside 12 in walkway width. Requires more than 9 sec to ambulate 20 ft.   12.34 seconds   Ambulating Backwards  Walks 20 ft, uses assistive device, slower speed, mild gait deviations, deviates 6-10 in outside 12 in walkway width.   19.37 seconds   Steps  Alternating feet, no rail.    Total Score  27    FGA comment:  27/30                   [facility] Adult PT Treatment/Exercise - 10/11/19 1628      Transfers   Comments  1 x 5 reps sit <> stand with eyes closed on single pillow, 2 x 5 reps sit <> stands on 2 pillows with eyes closed - min guard for balance      Neuro Re-ed    Neuro Re-ed Details   Next to countertop on blue and red mats: backwards walking down and back 2 reps, 3 reps with added cognitive challenge (asking pt how to cook her favorite recipes, what she ate for thanksgiving) 2 reps down and back eyes closed           Access Code: [email or web address]  URL: [email or web address]  Date: 10/11/2019  Prepared by: Janann August   Upgraded pt's HEP:   Exercises Tandem Walking with Counter Support - 3 sets - 2x daily - 7x weekly - to include backwards tandem walking  Heel Walking - 3 sets - 2x daily - 7x weekly Toe Walking - 3 sets - 2x daily - 7x weekly Single Leg Stance with Support - 3 sets - 2x daily - 7x weekly Romberg Stance Eyes Closed on Foam Pad - 10 reps - 2 sets - 2x daily - 7x weekly -eyes closed on 2 pillows 1 x 10 reps head nods and turns, 2  x 5 reps diagonal head movements  Standing Romberg to 3/4 Tandem Stance - 3 reps - 20 sets - 2x daily - 7x weekly -on 2  pillows with eyes closed, holding for 20 seconds     PT Education - 10/11/19 1624    Education Details  progress towards LTGs, new additions to HEP    Person(s) Educated  Patient    Methods  Explanation;Demonstration;Handout    Comprehension  Verbalized understanding;Returned demonstration       PT Short Term Goals - 10/11/19 1632      PT SHORT TERM GOAL #1   Title  Patient will be independent with initial HEP in order to build upon functional gains made in therapy. ALL STGS DUE 09/28/19    Time  3    Period  Weeks    Status  Achieved    Target Date  09/28/19      PT SHORT TERM GOAL #2   Title  Patient will improve FGA score to at least a 24/30 in order to demo decreased fall risk.    Baseline  27/30 on 10/11/19    Time  3    Period  Weeks    Status  Achieved        PT Long Term Goals - 10/11/19 1632      PT LONG TERM GOAL #1   Title  Patient will be independent with final HEP in order to build upon functional gains made in therapy. ALL LTGS DUE 10/19/19    Time  6    Period  Weeks    Status  New      PT LONG TERM GOAL #2   Title  Patient will improve FGA score to at least a 27/30 in order to demo decreased fall risk.    Baseline  27/30 on 10/11/19    Time  6    Period  Weeks    Status  Achieved      PT LONG TERM GOAL #3   Title  Patient will ambulate 1,000 feet outdoors on unlevel surfaces such as pavement and grass with mod I while scanning environment with no LOB in order to improve community mobility.    Time  6    Period  Weeks    Status  New      PT LONG TERM GOAL #4   Title  Patient will improve SLS time on BLE to at least 10 seconds to improve static standing balance for gait and ADLs.    Baseline  5 seconds B    Time  6    Period  Weeks    Status  New            Plan - 10/11/19 1630    Clinical Impression Statement  Pt  scored a 27/30 today on the FGA - pt has met her STG and LTG in regards to this balance assessment and this score puts pt at a lower risk for falls (previously 21/30). Pt continues to have difficulty with gait with eyes closed and backwards walking. Pt with increased difficulty today with gait on compliant surfaces and when adding a cognitive task. Anticipate D/C after next visit. Will continue to progress towards LTGs.    Personal Factors and Comorbidities  Time since onset of injury/illness/exacerbation;Comorbidity 3+;Past/Current Experience    Comorbidities  arthritis, breast cancer, hypertension.    Examination-Activity Limitations  Locomotion Level    Examination-Participation Restrictions  Community Activity    Stability/Clinical Decision Making  Stable/Uncomplicated    Rehab Potential  Good    PT Frequency  1x / week    PT Duration  6 weeks  PT Treatment/Interventions  ADLs/Self Care Home Management;Aquatic Therapy;Therapeutic exercise;Therapeutic activities;Stair training;Gait training;Balance training;Neuromuscular re-education;Patient/family education    PT Next Visit Plan  SLS on R, backwards walking, eyes closed balance. check remaining LTGs.    PT Home Exercise Plan  [email or web address]    Consulted and Agree with Plan of Care  Patient       Patient will benefit from skilled therapeutic intervention in order to improve the following deficits and impairments:  Decreased balance, Difficulty walking  Visit Diagnosis: History of falling  Unsteadiness on feet     Problem List Patient Active Problem List   Diagnosis Date Noted  . Neck pain 03/30/2018  . Osteopenia determined by x-ray 08/28/2016  . History of right breast cancer 08/28/2016  . Malignant neoplasm of right female breast ([place]) 01/23/2016  . History of chemotherapy 01/23/2016  . Estrogen deficiency 07/29/2015  . 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/24/2015  . Dense breast tissue 01/24/2015  . Hx of adenomatous colonic  polyps 01/24/2015  . Plantar fasciitis, bilateral 01/24/2015  . Breast cancer, right breast ([place]) 07/15/2013  . Syncope 09/15/2012  . Hypokalemia 09/15/2012  . Chronic back pain   . Chronic urinary tract infection   . Hypertension   . Arthritis   . Synovial cyst of lumbar facet joint 11/01/2011    Arliss Journey, PT, DPT  10/11/2019, 4:33 PM  [facility] [address] [place], [place], [address] Phone: [phone]   Fax:  [phone]  Name: Barbara Rangel MRN: [id number] Date of Birth: October 29, 1946

## 2019-10-11 NOTE — Patient Instructions (Signed)
Access Code: [email or web address]  URL: [email or web address]  Date: 10/11/2019  Prepared by: Janann August   Exercises Tandem Walking with Counter Support - 3 sets - 2x daily - 7x weekly Heel Walking - 3 sets - 2x daily - 7x weekly Toe Walking - 3 sets - 2x daily - 7x weekly Single Leg Stance with Support - 3 sets - 2x daily - 7x weekly Romberg Stance Eyes Closed on Foam Pad - 10 reps - 2 sets - 2x daily - 7x weekly Standing Romberg to 3/4 Tandem Stance - 3 reps - 20 sets - 2x daily - 7x weekly

## 2019-10-18 ENCOUNTER — Ambulatory Visit: Payer: Medicare Other | Admitting: Physical Therapy

## 2019-10-21 DIAGNOSIS — Z961 Presence of intraocular lens: Secondary | ICD-10-CM | POA: Diagnosis not present

## 2019-10-21 DIAGNOSIS — H401131 Primary open-angle glaucoma, bilateral, mild stage: Secondary | ICD-10-CM | POA: Diagnosis not present

## 2019-10-21 DIAGNOSIS — H2512 Age-related nuclear cataract, left eye: Secondary | ICD-10-CM | POA: Diagnosis not present

## 2019-10-22 ENCOUNTER — Encounter: Payer: Self-pay | Admitting: Physical Therapy

## 2019-10-22 ENCOUNTER — Ambulatory Visit: Payer: Medicare Other | Attending: Neurology | Admitting: Physical Therapy

## 2019-10-22 ENCOUNTER — Other Ambulatory Visit: Payer: Self-pay

## 2019-10-22 DIAGNOSIS — R2681 Unsteadiness on feet: Secondary | ICD-10-CM | POA: Diagnosis present

## 2019-10-22 DIAGNOSIS — Z9181 History of falling: Secondary | ICD-10-CM | POA: Insufficient documentation

## 2019-10-22 NOTE — Therapy (Signed)
[facility] [address] [place], [place], [address] Phone: [phone]   Fax:  [phone]  Physical Therapy Treatment/ Discharge Summary  Patient Details  Name: Barbara Rangel MRN: [id number] Date of Birth: 01/07/1946 Referring Provider (PT): Star Age, MD   Encounter Date: 10/22/2019  PT End of Session - 10/22/19 1009    Visit Number  5    Number of Visits  6    Date for PT Re-Evaluation  11/05/19    Authorization Type  Medicare    PT Start Time  0933    PT Stop Time  1004    PT Time Calculation (min)  31 min    Equipment Utilized During Treatment  Gait belt    Activity Tolerance  Patient tolerated treatment well    Behavior During Therapy  [facility] for tasks assessed/performed       Past Medical History:  Diagnosis Date  . Arthritis    degenerative arthritis  . Breast cancer, right breast ([place]) 04/2004   T2N1 diagnosed June 2005, ER PR + and Her 2 negative, post mastectomy with axillary node dissection (possibly 9 or 11 nodes removed, with possibly 7 or 9 involved) then treatment on NSABP B28 with dose dense adriamycin cytoxan followed by taxol, all of this Rx  in [place]. Adjuvant arimidex begun Jan 2006. No radiation.   . Chronic back pain    right radicular leg pain  . Chronic urinary tract infection    takes Macrodantin and Cranberry daily  . Constipation    r/t pain meds and takes Dulcolax nightly  . Glaucoma    uses Xalantan nightly  . Hypertension    takes Amlodipine daily  . Hypokalemia 09/15/2012  . 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/24/2015  . Other and unspecified general anesthetics causing adverse effect in therapeutic use    hard to wake up  . PONV (postoperative nausea and vomiting)   . Syncope 09/15/2012    Past Surgical History:  Procedure Laterality Date  . ANKLE SURGERY  2011   left ankle with screws and plates  . BREAST LUMPECTOMY     x2  . cataract surgery  2005    right eye  . COLONOSCOPY    . COLONOSCOPY WITH PROPOFOL N/A 10/11/2014   Procedure: COLONOSCOPY WITH PROPOFOL;  Surgeon: Garlan Fair, MD;  Location: [facility];  Service: Endoscopy;  Laterality: N/A;  . EYE SURGERY  2004   right eye-detached retina  . left breast biopsy  2009  . LUMBAR LAMINECTOMY/DECOMPRESSION MICRODISCECTOMY  10/31/2011   Procedure: LUMBAR LAMINECTOMY/DECOMPRESSION MICRODISCECTOMY;  Surgeon: Dahlia Bailiff;  Location: [facility];  Service: Orthopedics;  Laterality: Right;  L4-5 Decompression with Right Facet Decompression   . MASTECTOMY  2005   right  d/t breast cancer;no sticks to right arm LYMPH NODES REMOVED.  Marland Kitchen RETINAL DETACHMENT SURGERY  2004  . right breast biopsy  2005  . TUBAL LIGATION  1980    There were no vitals filed for this visit.  Subjective Assessment - 10/22/19 0935    Subjective  No falls. No new complaints. Exercises are going well at home.    Pertinent History  arthritis, breast cancer, hypertension, hx of lumbar laminectomy at R L4-L5 in 2012    Diagnostic tests  MRI from 10/30 of brain - no acute findings.    Patient Stated Goals  wants to learn how to prevent falls from happening.    Currently in Pain?  No/denies  [facility] PT Assessment - 10/22/19 0001      Ambulation/Gait   Ambulation/Gait  Yes    Ambulation/Gait Assistance  7: Independent      Functional Gait  Assessment   Gait assessed   Yes    Gait Level Surface  Walks 20 ft in less than 5.5 sec, no assistive devices, good speed, no evidence for imbalance, normal gait pattern, deviates no more than 6 in outside of the 12 in walkway width.    Change in Gait Speed  Able to smoothly change walking speed without loss of balance or gait deviation. Deviate no more than 6 in outside of the 12 in walkway width.    Gait with Horizontal Head Turns  Performs head turns smoothly with no change in gait. Deviates no more than 6 in outside 12 in walkway width    Gait with Vertical Head Turns   Performs head turns with no change in gait. Deviates no more than 6 in outside 12 in walkway width.    Gait and Pivot Turn  Pivot turns safely within 3 sec and stops quickly with no loss of balance.    Step Over Obstacle  Is able to step over 2 stacked shoe boxes taped together (9 in total height) without changing gait speed. No evidence of imbalance.    Gait with Narrow Base of Support  Is able to ambulate for 10 steps heel to toe with no staggering.    Gait with Eyes Closed  Walks 20 ft, uses assistive device, slower speed, mild gait deviations, deviates 6-10 in outside 12 in walkway width. Ambulates 20 ft in less than 9 sec but greater than 7 sec.    Ambulating Backwards  Walks 20 ft, uses assistive device, slower speed, mild gait deviations, deviates 6-10 in outside 12 in walkway width.    Steps  Alternating feet, no rail.    Total Score  28    FGA comment:  28/30                   [facility] Adult PT Treatment/Exercise - 10/22/19 0001      Ambulation/Gait   Ambulation/Gait Assistance Details  ambulating outdoors on paved surfaces and grass - asking pt to scan environment and speed up gait speed with no LOB     Ambulation Distance (Feet)  1000 Feet    Assistive device  None    Gait Pattern  Within Functional Limits    Ambulation Surface  Unlevel;Outdoor;Paved;Grass           Access Code: [email or web address]  URL: [email or web address]  Date: 10/22/2019  Prepared by: Janann August   Exercises Tandem Walking with Counter Support - 3 sets - 2x daily - 7x weekly Heel Walking - 3 sets - 2x daily - 7x weekly Toe Walking - 3 sets - 2x daily - 7x weekly Single Leg Stance with Support - 3 sets - 2x daily - 7x weekly Romberg Stance Eyes Closed on Foam Pad - 10 reps - 2 sets - 2x daily - 7x weekly Standing Romberg to 3/4 Tandem Stance - 3 reps - 20 sets - 2x daily - 7x weekly Sit to Stand - 10 reps - 2 sets - 1x daily - 7x weekly    PT Education - 10/22/19 1008     Education Details  final HEP, LTGs progress    Person(s) Educated  Patient    Methods  Explanation;Demonstration;Handout    Comprehension  Verbalized understanding;Returned demonstration  PT Short Term Goals - 10/11/19 1632      PT SHORT TERM GOAL #1   Title  Patient will be independent with initial HEP in order to build upon functional gains made in therapy. ALL STGS DUE 09/28/19    Time  3    Period  Weeks    Status  Achieved    Target Date  09/28/19      PT SHORT TERM GOAL #2   Title  Patient will improve FGA score to at least a 24/30 in order to demo decreased fall risk.    Baseline  27/30 on 10/11/19    Time  3    Period  Weeks    Status  Achieved        PT Long Term Goals - 10/22/19 9833      PT LONG TERM GOAL #1   Title  Patient will be independent with final HEP in order to build upon functional gains made in therapy. ALL LTGS DUE 10/19/19    Time  6    Period  Weeks    Status  New      PT LONG TERM GOAL #2   Title  Patient will improve FGA score to at least a 27/30 in order to demo decreased fall risk.    Baseline  28/30 on 10/22/19    Time  6    Period  Weeks    Status  Achieved      PT LONG TERM GOAL #3   Title  Patient will ambulate 1,000 feet outdoors on unlevel surfaces such as pavement and grass with mod I while scanning environment with no LOB in order to improve community mobility.    Baseline  on 10/22/19    Time  6    Period  Weeks    Status  Achieved      PT LONG TERM GOAL #4   Title  Patient will improve SLS time on BLE to at least 10 seconds to improve static standing balance for gait and ADLs.    Baseline  13 seconds on L, 5 seconds on R    Time  6    Period  Weeks    Status  Partially Met            Plan - 10/22/19 1014    Clinical Impression Statement  Focus of today's session was assessing pt's LTGs for D/C. Pt improved her FGA score to a 28/30 (was a 21/30) at eval - decreasing pt's risk for falls. Pt ambulated 1,000 ft  outdoors on paved/grass surface while scanning environment independently. Pt partially met LTG #4 - pt only able to hold R SLS for 5 seconds. Pt met all other LTGs. Verbally reviewed and pt demonstrated standing balance HEP. Pt is pleased with her progress - pt will be discharged from PT at this time.    Personal Factors and Comorbidities  Time since onset of injury/illness/exacerbation;Comorbidity 3+;Past/Current Experience    Comorbidities  arthritis, breast cancer, hypertension.    Examination-Activity Limitations  Locomotion Level    Examination-Participation Restrictions  Community Activity    Stability/Clinical Decision Making  Stable/Uncomplicated    Rehab Potential  Good    PT Frequency  1x / week    PT Duration  6 weeks    PT Treatment/Interventions  ADLs/Self Care Home Management;Aquatic Therapy;Therapeutic exercise;Therapeutic activities;Stair training;Gait training;Balance training;Neuromuscular re-education;Patient/family education    PT Next Visit Plan  D/C    PT Home Exercise Plan  [facility]  Consulted and Agree with Plan of Care  Patient       Patient will benefit from skilled therapeutic intervention in order to improve the following deficits and impairments:  Decreased balance, Difficulty walking  Visit Diagnosis: History of falling  Unsteadiness on feet    PHYSICAL THERAPY DISCHARGE SUMMARY  Visits from Start of Care: 5  Current functional level related to goals / functional outcomes: See LTGs.   Remaining deficits: Gait with eyes closed.    Education / Equipment: HEP  Plan: Patient agrees to discharge.  Patient goals were met. Patient is being discharged due to meeting the stated rehab goals.  ?????       Problem List Patient Active Problem List   Diagnosis Date Noted  . Neck pain 03/30/2018  . Osteopenia determined by x-ray 08/28/2016  . History of right breast cancer 08/28/2016  . Malignant neoplasm of right female breast ([place]) 01/23/2016  .  History of chemotherapy 01/23/2016  . Estrogen deficiency 07/29/2015  . 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/24/2015  . Dense breast tissue 01/24/2015  . Hx of adenomatous colonic polyps 01/24/2015  . Plantar fasciitis, bilateral 01/24/2015  . Breast cancer, right breast ([place]) 07/15/2013  . Syncope 09/15/2012  . Hypokalemia 09/15/2012  . Chronic back pain   . Chronic urinary tract infection   . Hypertension   . Arthritis   . Synovial cyst of lumbar facet joint 11/01/2011    Arliss Journey, PT, DPT  10/22/2019, 10:15 AM  [facility] [address] [place], [place], [address] Phone: [phone]   Fax:  [phone]  Name: RAMLA HASE MRN: [id number] Date of Birth: [birth year]

## 2019-10-22 NOTE — Patient Instructions (Signed)
Access Code: [email or web address]  URL: [email or web address]  Date: 10/22/2019  Prepared by: Janann August   Exercises Tandem Walking with Counter Support - 3 sets - 2x daily - 7x weekly Heel Walking - 3 sets - 2x daily - 7x weekly Toe Walking - 3 sets - 2x daily - 7x weekly Single Leg Stance with Support - 3 sets - 2x daily - 7x weekly Romberg Stance Eyes Closed on Foam Pad - 10 reps - 2 sets - 2x daily - 7x weekly Standing Romberg to 3/4 Tandem Stance - 3 reps - 20 sets - 2x daily - 7x weekly Sit to Stand - 10 reps - 2 sets - 1x daily - 7x weekly

## 2019-10-26 ENCOUNTER — Other Ambulatory Visit: Payer: Self-pay

## 2019-10-26 ENCOUNTER — Encounter: Payer: Self-pay | Admitting: Sports Medicine

## 2019-10-26 ENCOUNTER — Ambulatory Visit (INDEPENDENT_AMBULATORY_CARE_PROVIDER_SITE_OTHER): Payer: Medicare Other | Admitting: Sports Medicine

## 2019-10-26 DIAGNOSIS — M79671 Pain in right foot: Secondary | ICD-10-CM

## 2019-10-26 DIAGNOSIS — Q828 Other specified congenital malformations of skin: Secondary | ICD-10-CM

## 2019-10-26 DIAGNOSIS — L989 Disorder of the skin and subcutaneous tissue, unspecified: Secondary | ICD-10-CM

## 2019-10-26 NOTE — Progress Notes (Signed)
Subjective: Barbara Rangel is a 73 y.o. female patient who returns to office for  Follow up evaluation of right foot pain secondary to callus skin. Patient complains of pain at the lesion present sub-met 5 on right. Patient reports that the previous trim helped for 2-2.5 months. No new issues. Patient denies any other pedal complaints.   Patient Active Problem List   Diagnosis Date Noted  . Neck pain 03/30/2018  . Osteopenia determined by x-ray 08/28/2016  . History of right breast cancer 08/28/2016  . Malignant neoplasm of right female breast ([place]) 01/23/2016  . History of chemotherapy 01/23/2016  . Estrogen deficiency 07/29/2015  . 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/24/2015  . Dense breast tissue 01/24/2015  . Hx of adenomatous colonic polyps 01/24/2015  . Plantar fasciitis, bilateral 01/24/2015  . Breast cancer, right breast ([place]) 07/15/2013  . Syncope 09/15/2012  . Hypokalemia 09/15/2012  . Chronic back pain   . Chronic urinary tract infection   . Hypertension   . Arthritis   . Synovial cyst of lumbar facet joint 11/01/2011    Current Outpatient Medications on File Prior to Visit  Medication Sig Dispense Refill  . acetaminophen (TYLENOL) 500 MG tablet Take 1,000 mg by mouth at bedtime.    Marland Kitchen amLODipine (NORVASC) 5 MG tablet Take 5 mg by mouth at bedtime.     Marland Kitchen aspirin EC 81 MG tablet Take 81 mg by mouth every morning.     Marland Kitchen atorvastatin (LIPITOR) 20 MG tablet Take 1 tablet by mouth every morning.    Marland Kitchen azelastine (ASTELIN) 0.1 % nasal spray   0  . calcium citrate-vitamin D (CITRACAL+D) 315-200 MG-UNIT per tablet Take 2 tablets by mouth 2 (two) times daily.     . Cranberry Extract 250 MG TABS Take 1 capsule by mouth at bedtime.     . docusate sodium (COLACE) 100 MG capsule Take 100 mg by mouth 2 (two) times daily.      . famotidine (PEPCID) 20 MG tablet TK 1 T PO QD HS PRN    . latanoprost (XALATAN) 0.005 % ophthalmic solution Place 1 drop into both eyes at bedtime.      Marland Kitchen  levocetirizine (XYZAL) 5 MG tablet Take 5 mg by mouth daily.    Marland Kitchen LORazepam (ATIVAN) 0.5 MG tablet TAKE 1/2 TO 1 TABLET BY MOUTH TWICE DAILY AS NEEDED ONLY WHEN NEEDED    . Multiple Vitamins-Minerals (MULTIVITAMINS THER. W/MINERALS) TABS Take 1 tablet by mouth every morning.     Marland Kitchen omeprazole (PRILOSEC) 40 MG capsule Take 40 mg by mouth every other day.     . triamcinolone (NASACORT ALLERGY 24HR) 55 MCG/ACT AERO nasal inhaler Place 2 sprays into the nose as needed.      No current facility-administered medications on file prior to visit.    Allergies  Allergen Reactions  . Nitrous Oxide Other (See Comments)    Pt passed out and had confusion. Took a few hours to wake her up.  . Avelox [Moxifloxacin Hcl In Nacl] Other (See Comments)    Severe headache   . Cephalexin Other (See Comments)    Caused headache  . Other Other (See Comments)    Anesthesia, needs antiemetic med  . Quinolones Other (See Comments)    Headaches?  . Tramadol Nausea Only and Other (See Comments)    Nausea and Dizziness, too.  . Trimethoprim Other (See Comments)    Rapid heartbeat.  . Citalopram Hydrobromide Other (See Comments)    Pt  can't remember what the side effect was.    Objective:  General: Alert and oriented x3 in no acute distress  Dermatology: Keratotic lesion present sub-met 5 on right with skin lines transversing the lesion, pain is present with direct pressure to the lesion with a central nucleated core noted, no webspace macerations, no ecchymosis bilateral, all nails x 10 are well manicured.  Vascular: Dorsalis Pedis and Posterior Tibial pedal pulses 1/4, Capillary Fill Time 3 seconds, + pedal hair growth bilateral, no edema bilateral lower extremities, Temperature gradient within normal limits.  Neurology: Johney Maine sensation intact via light touch bilateral.  Musculoskeletal: Mild tenderness with palpation at the keratotic lesion site on Right, fat pad atrophy bunion and hammertoe deformity pes  planus foot type with pain to right sub-met 5 at area of nucleation as noted above.  Muscular strength 5/5 in all groups without pain or limitation on range of motion.   Assessment and Plan: Problem List Items Addressed This Visit    None    Visit Diagnoses    Porokeratosis    -  Primary   Benign skin lesion       Right foot pain         -Complete examination performed -Re-Discussed treatment options -Parred keratoic lesion using a chisel blade x1; treated the area withSalinocaine covered with offloading padding, dispensed additional padding again this visit -Encouraged patient to consider surgery met head rsx if continues to be bothersome  -Encouraged daily skin emollients -Encouraged use of pumice stone -Advised good supportive shoes and inserts like before -Patient to return to office in 3 to 4 months or as needed or sooner if condition worsens.  Landis Martins, DPM

## 2019-12-14 ENCOUNTER — Other Ambulatory Visit: Payer: Self-pay | Admitting: *Deleted

## 2019-12-14 DIAGNOSIS — C50011 Malignant neoplasm of nipple and areola, right female breast: Secondary | ICD-10-CM

## 2019-12-14 DIAGNOSIS — Z0000 Encounter for general adult medical examination without abnormal findings: Secondary | ICD-10-CM

## 2019-12-14 DIAGNOSIS — Z1231 Encounter for screening mammogram for malignant neoplasm of breast: Secondary | ICD-10-CM

## 2019-12-14 DIAGNOSIS — Z79811 Long term (current) use of aromatase inhibitors: Secondary | ICD-10-CM

## 2019-12-14 DIAGNOSIS — C50911 Malignant neoplasm of unspecified site of right female breast: Secondary | ICD-10-CM

## 2019-12-14 DIAGNOSIS — M8580 Other specified disorders of bone density and structure, unspecified site: Secondary | ICD-10-CM

## 2020-01-04 ENCOUNTER — Other Ambulatory Visit: Payer: Self-pay

## 2020-01-04 ENCOUNTER — Ambulatory Visit: Payer: Medicare Other

## 2020-01-04 ENCOUNTER — Ambulatory Visit (HOSPITAL_COMMUNITY)
Admission: RE | Admit: 2020-01-04 | Discharge: 2020-01-04 | Disposition: A | Discharge status: Home / Self Care | Payer: Medicare Other | Source: Ambulatory Visit | Attending: Surgery | Admitting: Surgery

## 2020-01-04 DIAGNOSIS — I6523 Occlusion and stenosis of bilateral carotid arteries: Secondary | ICD-10-CM

## 2020-01-05 ENCOUNTER — Ambulatory Visit (INDEPENDENT_AMBULATORY_CARE_PROVIDER_SITE_OTHER): Payer: Medicare Other | Admitting: Physician Assistant

## 2020-01-05 DIAGNOSIS — I6523 Occlusion and stenosis of bilateral carotid arteries: Secondary | ICD-10-CM

## 2020-01-05 DIAGNOSIS — I6529 Occlusion and stenosis of unspecified carotid artery: Secondary | ICD-10-CM | POA: Insufficient documentation

## 2020-01-05 NOTE — Progress Notes (Signed)
Virtual Visit via Telephone Note   I connected with Barbara Rangel on 01/05/2020 using the [email or web address] by telephone and verified that I was speaking with the correct person using two identifiers. Patient was located at home. I am located at the [facility] office.   The limitations of evaluation and management by telemedicine and the availability of in person appointments have been previously discussed with the patient and are documented in the patients chart. The patient expressed understanding and consented to proceed.  PCP: Leeroy Cha, MD  Chief Complaint: carotid artery stenosis  History of Present Illness: Barbara Rangel is a 74 y.o. female who is contacted by telephone for a visit to go over carotid duplex.  She denies any history of CVA or TIA.  She denies any strokelike symptoms since last office visit including slurring speech, changes in vision, or one-sided weakness.  She continues to take her aspirin and statin daily.  She denies tobacco use.  Past Medical History:  Diagnosis Date  . Arthritis    degenerative arthritis  . Breast cancer, right breast ([place]) 04/2004   T2N1 diagnosed June 2005, ER PR + and Her 2 negative, post mastectomy with axillary node dissection (possibly 9 or 11 nodes removed, with possibly 7 or 9 involved) then treatment on NSABP B28 with dose dense adriamycin cytoxan followed by taxol, all of this Rx  in [place]. Adjuvant arimidex begun Jan 2006. No radiation.   . Chronic back pain    right radicular leg pain  . Chronic urinary tract infection    takes Macrodantin and Cranberry daily  . Constipation    r/t pain meds and takes Dulcolax nightly  . Glaucoma    uses Xalantan nightly  . Hypertension    takes Amlodipine daily  . Hypokalemia 09/15/2012  . 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/24/2015  . Other and unspecified general anesthetics causing adverse effect in therapeutic use    hard to wake up  . PONV (postoperative nausea and  vomiting)   . Syncope 09/15/2012    Past Surgical History:  Procedure Laterality Date  . ANKLE SURGERY  2011   left ankle with screws and plates  . BREAST LUMPECTOMY     x2  . cataract surgery  2005   right eye  . COLONOSCOPY    . COLONOSCOPY WITH PROPOFOL N/A 10/11/2014   Procedure: COLONOSCOPY WITH PROPOFOL;  Surgeon: Garlan Fair, MD;  [facility];  Service: Endoscopy;  Laterality: N/A;  . EYE SURGERY  2004   right eye-detached retina  . left breast biopsy  2009  . LUMBAR LAMINECTOMY/DECOMPRESSION MICRODISCECTOMY  10/31/2011   Procedure: LUMBAR LAMINECTOMY/DECOMPRESSION MICRODISCECTOMY;  Surgeon: Dahlia Bailiff;  Location: [facility];  Service: Orthopedics;  Laterality: Right;  L4-5 Decompression with Right Facet Decompression   . MASTECTOMY  2005   right  d/t breast cancer;no sticks to right arm LYMPH NODES REMOVED.  Marland Kitchen RETINAL DETACHMENT SURGERY  2004  . right breast biopsy  2005  . TUBAL LIGATION  1980    Current Meds  Medication Sig  . acetaminophen (TYLENOL) 500 MG tablet Take 1,000 mg by mouth at bedtime.  Marland Kitchen amLODipine (NORVASC) 5 MG tablet Take 5 mg by mouth at bedtime.   Marland Kitchen aspirin EC 81 MG tablet Take 81 mg by mouth every morning.   Marland Kitchen atorvastatin (LIPITOR) 20 MG tablet Take 1 tablet by mouth every morning.  Marland Kitchen azelastine (ASTELIN) 0.1 % nasal spray   . calcium citrate-vitamin D (  CITRACAL+D) 315-200 MG-UNIT per tablet Take 2 tablets by mouth 2 (two) times daily.   . Cranberry Extract 250 MG TABS Take 1 capsule by mouth at bedtime.   . docusate sodium (COLACE) 100 MG capsule Take 100 mg by mouth 2 (two) times daily.    . famotidine (PEPCID) 20 MG tablet TK 1 T PO QD HS PRN  . latanoprost (XALATAN) 0.005 % ophthalmic solution Place 1 drop into both eyes at bedtime.    Marland Kitchen levocetirizine (XYZAL) 5 MG tablet Take 5 mg by mouth daily.  Marland Kitchen LORazepam (ATIVAN) 0.5 MG tablet TAKE 1/2 TO 1 TABLET BY MOUTH TWICE DAILY AS NEEDED ONLY WHEN NEEDED  . Multiple  Vitamins-Minerals (MULTIVITAMINS THER. W/MINERALS) TABS Take 1 tablet by mouth every morning.   Marland Kitchen omeprazole (PRILOSEC) 40 MG capsule Take 40 mg by mouth every other day.   . triamcinolone (NASACORT ALLERGY 24HR) 55 MCG/ACT AERO nasal inhaler Place 2 sprays into the nose as needed.     12 system ROS was negative unless otherwise noted in HPI   Observations/Objective: BP 123/64  Carotid duplex demonstrates 40 to 59% stenosis of internal carotid artery bilaterally  Assessment and Plan: Carotid duplex demonstrates 40 to 59% ICA stenosis bilaterally; this is unchanged over the last year Going forward we can follow carotid stenosis on an annual basis as long as findings remain stable Continue aspirin and statin daily   Follow Up Instructions:   Follow up in 1 year(s)   I discussed the assessment and treatment plan with the patient. The patient was provided an opportunity to ask questions and all were answered. The patient agreed with the plan and demonstrated an understanding of the instructions.   The patient was advised to call back or seek an in-person evaluation if the symptoms worsen or if the condition fails to improve as anticipated.  I spent 10 minutes with the patient via telephone encounter.   Signed, Dagoberto Ligas [facility] Office: [phone]  01/05/2020, 11:03 AM

## 2020-01-07 ENCOUNTER — Other Ambulatory Visit: Payer: Self-pay | Admitting: *Deleted

## 2020-01-07 DIAGNOSIS — I6523 Occlusion and stenosis of bilateral carotid arteries: Secondary | ICD-10-CM

## 2020-01-19 ENCOUNTER — Other Ambulatory Visit: Payer: Medicare Other

## 2020-01-24 ENCOUNTER — Other Ambulatory Visit: Payer: Medicare Other

## 2020-01-25 ENCOUNTER — Ambulatory Visit (INDEPENDENT_AMBULATORY_CARE_PROVIDER_SITE_OTHER): Payer: Medicare Other | Admitting: Sports Medicine

## 2020-01-25 ENCOUNTER — Other Ambulatory Visit: Payer: Self-pay

## 2020-01-25 ENCOUNTER — Encounter: Payer: Self-pay | Admitting: Sports Medicine

## 2020-01-25 VITALS — Temp 97.3°F

## 2020-01-25 DIAGNOSIS — Q828 Other specified congenital malformations of skin: Secondary | ICD-10-CM

## 2020-01-25 DIAGNOSIS — M79671 Pain in right foot: Secondary | ICD-10-CM

## 2020-01-25 DIAGNOSIS — L989 Disorder of the skin and subcutaneous tissue, unspecified: Secondary | ICD-10-CM | POA: Diagnosis not present

## 2020-01-25 NOTE — Progress Notes (Addendum)
Subjective: Barbara Rangel is a 74 y.o. female patient who returns to office for  Follow up evaluation of right foot pain secondary to callus skin. Patient reports that callus feels pretty good this time not too painful and not much buildup since last visit.  No new issues. Patient denies any other pedal complaints.   Patient Active Problem List   Diagnosis Date Noted  . Carotid artery stenosis 01/05/2020  . Neck pain 03/30/2018  . Osteopenia determined by x-ray 08/28/2016  . History of right breast cancer 08/28/2016  . Malignant neoplasm of right female breast ([place]) 01/23/2016  . History of chemotherapy 01/23/2016  . Estrogen deficiency 07/29/2015  . 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/24/2015  . Dense breast tissue 01/24/2015  . Hx of adenomatous colonic polyps 01/24/2015  . Plantar fasciitis, bilateral 01/24/2015  . Breast cancer, right breast ([place]) 07/15/2013  . Syncope 09/15/2012  . Hypokalemia 09/15/2012  . Chronic back pain   . Chronic urinary tract infection   . Hypertension   . Arthritis   . Synovial cyst of lumbar facet joint 11/01/2011    Current Outpatient Medications on File Prior to Visit  Medication Sig Dispense Refill  . acetaminophen (TYLENOL) 500 MG tablet Take 1,000 mg by mouth at bedtime.    Marland Kitchen amLODipine (NORVASC) 5 MG tablet Take 5 mg by mouth at bedtime.     Marland Kitchen aspirin EC 81 MG tablet Take 81 mg by mouth every morning.     Marland Kitchen atorvastatin (LIPITOR) 20 MG tablet Take 1 tablet by mouth every morning.    Marland Kitchen azelastine (ASTELIN) 0.1 % nasal spray   0  . calcium citrate-vitamin D (CITRACAL+D) 315-200 MG-UNIT per tablet Take 2 tablets by mouth 2 (two) times daily.     . Cranberry Extract 250 MG TABS Take 1 capsule by mouth at bedtime.     . docusate sodium (COLACE) 100 MG capsule Take 100 mg by mouth 2 (two) times daily.      . famotidine (PEPCID) 20 MG tablet TK 1 T PO QD HS PRN    . latanoprost (XALATAN) 0.005 % ophthalmic solution Place 1 drop into both eyes  at bedtime.      Marland Kitchen levocetirizine (XYZAL) 5 MG tablet Take 5 mg by mouth daily.    Marland Kitchen LORazepam (ATIVAN) 0.5 MG tablet TAKE 1/2 TO 1 TABLET BY MOUTH TWICE DAILY AS NEEDED ONLY WHEN NEEDED    . Multiple Vitamins-Minerals (MULTIVITAMINS THER. W/MINERALS) TABS Take 1 tablet by mouth every morning.     Marland Kitchen omeprazole (PRILOSEC) 40 MG capsule Take 40 mg by mouth every other day.     . triamcinolone (NASACORT ALLERGY 24HR) 55 MCG/ACT AERO nasal inhaler Place 2 sprays into the nose as needed.      No current facility-administered medications on file prior to visit.    Allergies  Allergen Reactions  . Nitrous Oxide Other (See Comments)    Pt passed out and had confusion. Took a few hours to wake her up.  . Avelox [Moxifloxacin Hcl In Nacl] Other (See Comments)    Severe headache   . Cephalexin Other (See Comments)    Caused headache  . Other Other (See Comments)    Anesthesia, needs antiemetic med  . Quinolones Other (See Comments)    Headaches?  . Tramadol Nausea Only and Other (See Comments)    Nausea and Dizziness, too.  . Trimethoprim Other (See Comments)    Rapid heartbeat.  . Citalopram Hydrobromide Other (See Comments)  Pt can't remember what the side effect was.    Objective:  General: Alert and oriented x3 in no acute distress  Dermatology: Keratotic lesion present sub-met 5 on right with skin lines transversing the lesion, pain is present with direct pressure to the lesion with a central nucleated core noted, no webspace macerations, no ecchymosis bilateral, all nails x 10 are well manicured.  Vascular: Dorsalis Pedis and Posterior Tibial pedal pulses 1/4, Capillary Fill Time 3 seconds, + pedal hair growth bilateral, no edema bilateral lower extremities, Temperature gradient within normal limits.  Neurology: Johney Maine sensation intact via light touch bilateral.  Musculoskeletal: Mild tenderness with palpation at the keratotic lesion site on Right, fat pad atrophy bunion and  hammertoe deformity pes planus foot type with pain to right sub-met 5 at area of nucleation as noted above.  Muscular strength 5/5 in all groups without pain or limitation on range of motion.   Assessment and Plan: Problem List Items Addressed This Visit    None    Visit Diagnoses    Porokeratosis    -  Primary   Benign skin lesion       Right foot pain         -Complete examination performed -Discussed treatment options -At no additional charge/courtesy, Parred keratoic lesion using a chisel blade x1; treated the area withSalinocaine covered with Band-Aid -Dispensed offloading padding to use as instructed -Encouraged daily skin emollients -Encouraged use of pumice stone -Advised good supportive shoes and inserts like before -Patient to return to office in 3 to 4 months or as needed or sooner if condition worsens.  Landis Martins, DPM

## 2020-01-26 ENCOUNTER — Ambulatory Visit
Admission: RE | Admit: 2020-01-26 | Discharge: 2020-01-26 | Disposition: A | Discharge status: Home / Self Care | Payer: Medicare Other | Source: Ambulatory Visit | Attending: Hematology and Oncology | Admitting: Hematology and Oncology

## 2020-01-26 DIAGNOSIS — Z1231 Encounter for screening mammogram for malignant neoplasm of breast: Secondary | ICD-10-CM | POA: Diagnosis not present

## 2020-01-26 DIAGNOSIS — Z0000 Encounter for general adult medical examination without abnormal findings: Secondary | ICD-10-CM

## 2020-02-15 DIAGNOSIS — E785 Hyperlipidemia, unspecified: Secondary | ICD-10-CM | POA: Diagnosis not present

## 2020-02-15 DIAGNOSIS — Z0000 Encounter for general adult medical examination without abnormal findings: Secondary | ICD-10-CM | POA: Diagnosis not present

## 2020-02-15 DIAGNOSIS — Z23 Encounter for immunization: Secondary | ICD-10-CM | POA: Diagnosis not present

## 2020-02-15 DIAGNOSIS — K219 Gastro-esophageal reflux disease without esophagitis: Secondary | ICD-10-CM | POA: Diagnosis not present

## 2020-02-15 DIAGNOSIS — I10 Essential (primary) hypertension: Secondary | ICD-10-CM | POA: Diagnosis not present

## 2020-02-15 DIAGNOSIS — H6123 Impacted cerumen, bilateral: Secondary | ICD-10-CM | POA: Diagnosis not present

## 2020-02-15 DIAGNOSIS — R2681 Unsteadiness on feet: Secondary | ICD-10-CM | POA: Diagnosis not present

## 2020-02-15 DIAGNOSIS — C50911 Malignant neoplasm of unspecified site of right female breast: Secondary | ICD-10-CM | POA: Diagnosis not present

## 2020-02-15 DIAGNOSIS — Z1389 Encounter for screening for other disorder: Secondary | ICD-10-CM | POA: Diagnosis not present

## 2020-02-15 DIAGNOSIS — Z86010 Personal history of colonic polyps: Secondary | ICD-10-CM | POA: Diagnosis not present

## 2020-02-15 NOTE — Addendum Note (Signed)
Addended by: Landis Martins T on: 02/15/2020 03:00 PM   Modules accepted: Level of Service

## 2020-02-17 ENCOUNTER — Other Ambulatory Visit: Payer: Self-pay

## 2020-02-17 ENCOUNTER — Ambulatory Visit
Admission: RE | Admit: 2020-02-17 | Discharge: 2020-02-17 | Disposition: A | Discharge status: Home / Self Care | Payer: Medicare Other | Source: Ambulatory Visit | Attending: Hematology and Oncology | Admitting: Hematology and Oncology

## 2020-02-17 DIAGNOSIS — M8589 Other specified disorders of bone density and structure, multiple sites: Secondary | ICD-10-CM | POA: Diagnosis not present

## 2020-02-17 DIAGNOSIS — Z79811 Long term (current) use of aromatase inhibitors: Secondary | ICD-10-CM

## 2020-02-17 DIAGNOSIS — Z780 Asymptomatic menopausal state: Secondary | ICD-10-CM | POA: Diagnosis not present

## 2020-02-17 DIAGNOSIS — M8580 Other specified disorders of bone density and structure, unspecified site: Secondary | ICD-10-CM

## 2020-02-17 DIAGNOSIS — C50011 Malignant neoplasm of nipple and areola, right female breast: Secondary | ICD-10-CM

## 2020-02-17 DIAGNOSIS — C50911 Malignant neoplasm of unspecified site of right female breast: Secondary | ICD-10-CM

## 2020-02-22 DIAGNOSIS — Z961 Presence of intraocular lens: Secondary | ICD-10-CM | POA: Diagnosis not present

## 2020-02-22 DIAGNOSIS — H2512 Age-related nuclear cataract, left eye: Secondary | ICD-10-CM | POA: Diagnosis not present

## 2020-02-22 DIAGNOSIS — H6123 Impacted cerumen, bilateral: Secondary | ICD-10-CM | POA: Diagnosis not present

## 2020-02-22 DIAGNOSIS — H401131 Primary open-angle glaucoma, bilateral, mild stage: Secondary | ICD-10-CM | POA: Diagnosis not present

## 2020-02-28 NOTE — Progress Notes (Signed)
Patient Care Team: Leeroy Cha, MD as PCP - General (Internal Medicine) Nicholas Lose, MD as Consulting Physician (Hematology and Oncology)  DIAGNOSIS:    ICD-10-CM   1. 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, unspecified estrogen receptor status ([place])  C50.011     SUMMARY OF ONCOLOGIC HISTORY: Oncology History  Breast cancer, right breast ([place])  01/10/2004 Initial Diagnosis   Patient found right breast cancer on breast self exam after negative mammogram same year, diagnosis March 2005 in [place]   04/26/2004 Surgery   Right mastectomy: T2 N2a M0 ER/PR positive HER-2 negative stage IIIa; 9 lymph nodes were involved (did not get radiation)   05/18/2004 - 09/21/2004 Chemotherapy   NSABP B 28 clinical trial with dose dense Adriamycin and Cytoxan followed by Taxol    11/12/2004 - 02/26/2019 Anti-estrogen oral therapy   Arimidex 1 mg daily     CHIEF COMPLIANT: Follow-up of right breast cancer  INTERVAL HISTORY: Barbara Rangel is a 74 y.o. with above-mentioned history of right breast cancer treated with mastectomy, adjuvant chemotherapy, and completed anastrozole. Mammogram on 01/26/20 showed no evidence of malignancy in the left breast. Bone density scan on 02/17/20 showed osteopenia with a T-score of -1.5 at the forearm radius. She presents to the clinic today for annual follow-up.  She denies any lumps or nodules or any concerns in the breast.  ALLERGIES:  is allergic to nitrous oxide; avelox [moxifloxacin hcl in nacl]; cephalexin; other; quinolones; tramadol; trimethoprim; and citalopram hydrobromide.  MEDICATIONS:  Current Outpatient Medications  Medication Sig Dispense Refill  . acetaminophen (TYLENOL) 500 MG tablet Take 1,000 mg by mouth at bedtime.    Marland Kitchen amLODipine (NORVASC) 5 MG tablet Take 5 mg by mouth at bedtime.     Marland Kitchen aspirin EC 81 MG tablet Take 81 mg by mouth every morning.     Marland Kitchen atorvastatin (LIPITOR) 20 MG tablet  Take 1 tablet by mouth every morning.    Marland Kitchen azelastine (ASTELIN) 0.1 % nasal spray   0  . calcium citrate-vitamin D (CITRACAL+D) 315-200 MG-UNIT per tablet Take 2 tablets by mouth 2 (two) times daily.     . Cranberry Extract 250 MG TABS Take 1 capsule by mouth at bedtime.     . famotidine (PEPCID) 20 MG tablet TK 1 T PO QD HS PRN    . latanoprost (XALATAN) 0.005 % ophthalmic solution Place 1 drop into both eyes at bedtime.      Marland Kitchen levocetirizine (XYZAL) 5 MG tablet Take 5 mg by mouth daily.    Marland Kitchen LORazepam (ATIVAN) 0.5 MG tablet TAKE 1/2 TO 1 TABLET BY MOUTH TWICE DAILY AS NEEDED ONLY WHEN NEEDED    . Multiple Vitamins-Minerals (MULTIVITAMINS THER. W/MINERALS) TABS Take 1 tablet by mouth every morning.     Marland Kitchen omeprazole (PRILOSEC) 40 MG capsule Take 40 mg by mouth every other day.     . triamcinolone (NASACORT ALLERGY 24HR) 55 MCG/ACT AERO nasal inhaler Place 2 sprays into the nose as needed.     . dorzolamide-timolol (COSOPT) 22.3-6.8 MG/ML ophthalmic solution 1 drop 2 (two) times daily.     No current facility-administered medications for this visit.    PHYSICAL EXAMINATION: ECOG PERFORMANCE STATUS: 0 - Asymptomatic  Vitals:   02/29/20 1111  BP: 130/64  Pulse: 72  Resp: 18  Temp: 97.8 F (36.6 C)  SpO2: 99%   Filed Weights   02/29/20 1111  Weight: 131 lb 8 oz (59.6 kg)  BREAST: Right mastectomy scar is intact without any lumps or nodules.  Left breast no palpable lumps or nodules in the breast or axilla.  (exam performed in the presence of a chaperone)  LABORATORY DATA:  I have reviewed the data as listed CMP Latest Ref Rng & Units 08/31/2019 08/16/2019 02/24/2017  Glucose 70 - 99 mg/dL 112(H) 86 82  BUN 8 - 23 mg/dL 11 12 14.4  Creatinine 0.44 - 1.00 mg/dL 0.76 0.78 0.8  Sodium 135 - 145 mmol/L 138 142 143  Potassium 3.5 - 5.1 mmol/L 3.7 4.2 4.0  Chloride 98 - 111 mmol/L 102 103 -  CO2 22 - 32 mmol/L '24 26 28  '$ Calcium 8.9 - 10.3 mg/dL 9.9 10.5(H) 10.4  Total Protein 6.0 -  8.5 g/dL - 7.2 7.0  Total Bilirubin 0.0 - 1.2 mg/dL - 0.5 0.60  Alkaline Phos 39 - 117 IU/L - 79 60  AST 0 - 40 IU/L - 34 27  ALT 0 - 32 IU/L - 28 26    Lab Results  Component Value Date   WBC 5.8 08/31/2019   HGB 15.4 (H) 08/31/2019   HCT 46.3 (H) 08/31/2019   MCV 98.3 08/31/2019   PLT 220 08/31/2019   NEUTROABS 3.0 02/24/2017    ASSESSMENT & PLAN:  Breast cancer, right breast Stage IIIa right breast cancer T2 N2 M0 diagnosed in June 2005 in [place] treated with mastectomy followed by adjuvant chemotherapy. 7 on 9 lymph nodes were apparently involved., Did not get adjuvant radiation(Unknown reasons). Arimidex 1 mg daily started January 2006 stopped 02/26/2019  Osteopenia: Patient is on weightbearing exercise program.  Bone density 02/17/2020: T score -1.5: Stable  Breast cancer surveillance: 1.  Breast exam 02/29/2020: Benign, right mastectomy 2.  Mammogram 01/26/2020: Left breast: No evidence of malignancy breast density category C  Return to clinic in 1 year for follow-up    No orders of the defined types were placed in this encounter.  The patient has a good understanding of the overall plan. she agrees with it. she will call with any problems that may develop before the next visit here.  Total time spent: 20 mins including face to face time and time spent for planning, charting and coordination of care  Nicholas Lose, MD 02/29/2020  I, Cloyde Reams Dorshimer, am acting as scribe for Dr. Nicholas Lose.  I have reviewed the above documentation for accuracy and completeness, and I agree with the above.

## 2020-02-29 ENCOUNTER — Other Ambulatory Visit: Payer: Self-pay

## 2020-02-29 ENCOUNTER — Encounter: Payer: Self-pay | Admitting: Hematology and Oncology

## 2020-02-29 ENCOUNTER — Other Ambulatory Visit: Payer: Medicare Other

## 2020-02-29 ENCOUNTER — Inpatient Hospital Stay: Payer: Medicare Other | Attending: Hematology and Oncology | Admitting: Hematology and Oncology

## 2020-02-29 DIAGNOSIS — Z9011 Acquired absence of right breast and nipple: Secondary | ICD-10-CM | POA: Diagnosis not present

## 2020-02-29 DIAGNOSIS — Z170 Estrogen receptor positive status [ER+]: Secondary | ICD-10-CM | POA: Insufficient documentation

## 2020-02-29 DIAGNOSIS — C50011 Malignant neoplasm of nipple and areola, right female breast: Secondary | ICD-10-CM | POA: Diagnosis not present

## 2020-02-29 DIAGNOSIS — M8580 Other specified disorders of bone density and structure, unspecified site: Secondary | ICD-10-CM | POA: Insufficient documentation

## 2020-02-29 DIAGNOSIS — Z79811 Long term (current) use of aromatase inhibitors: Secondary | ICD-10-CM | POA: Insufficient documentation

## 2020-02-29 DIAGNOSIS — Z7982 Long term (current) use of aspirin: Secondary | ICD-10-CM | POA: Diagnosis not present

## 2020-02-29 DIAGNOSIS — I6523 Occlusion and stenosis of bilateral carotid arteries: Secondary | ICD-10-CM

## 2020-02-29 DIAGNOSIS — Z79899 Other long term (current) drug therapy: Secondary | ICD-10-CM | POA: Insufficient documentation

## 2020-02-29 NOTE — Assessment & Plan Note (Signed)
Stage IIIa right breast cancer T2 N2 M0 diagnosed in June 2005 in [place] treated with mastectomy followed by adjuvant chemotherapy. 7 on 9 lymph nodes were apparently involved., Did not get adjuvant radiation(Unknown reasons). Arimidex 1 mg daily started January 2006 stopped 02/26/2019  Osteopenia: Patient is on weightbearing exercise program.  Bone density 02/17/2020: T score -1.5: Stable  Breast cancer surveillance: 1.  Breast exam 02/29/2020: Benign, right mastectomy 2.  Mammogram 01/26/2020: Left breast: No evidence of malignancy breast density category C  Return to clinic in 1 year for follow-up

## 2020-03-02 ENCOUNTER — Other Ambulatory Visit: Payer: Medicare Other

## 2020-03-03 ENCOUNTER — Telehealth: Payer: Self-pay | Admitting: Hematology and Oncology

## 2020-03-03 NOTE — Telephone Encounter (Signed)
Scheduled per 04/20 los, patient has been called and notified.

## 2020-03-16 DIAGNOSIS — R12 Heartburn: Secondary | ICD-10-CM | POA: Diagnosis not present

## 2020-03-16 DIAGNOSIS — K219 Gastro-esophageal reflux disease without esophagitis: Secondary | ICD-10-CM | POA: Diagnosis not present

## 2020-03-22 DIAGNOSIS — Z1159 Encounter for screening for other viral diseases: Secondary | ICD-10-CM | POA: Diagnosis not present

## 2020-03-23 DIAGNOSIS — J301 Allergic rhinitis due to pollen: Secondary | ICD-10-CM | POA: Diagnosis not present

## 2020-03-23 DIAGNOSIS — J329 Chronic sinusitis, unspecified: Secondary | ICD-10-CM | POA: Diagnosis not present

## 2020-03-27 DIAGNOSIS — K317 Polyp of stomach and duodenum: Secondary | ICD-10-CM | POA: Diagnosis not present

## 2020-03-27 DIAGNOSIS — K219 Gastro-esophageal reflux disease without esophagitis: Secondary | ICD-10-CM | POA: Diagnosis not present

## 2020-03-27 DIAGNOSIS — K2930 Chronic superficial gastritis without bleeding: Secondary | ICD-10-CM | POA: Diagnosis not present

## 2020-03-27 DIAGNOSIS — R12 Heartburn: Secondary | ICD-10-CM | POA: Diagnosis not present

## 2020-03-29 ENCOUNTER — Ambulatory Visit (INDEPENDENT_AMBULATORY_CARE_PROVIDER_SITE_OTHER): Payer: Medicare Other | Admitting: Otolaryngology

## 2020-03-30 DIAGNOSIS — K2930 Chronic superficial gastritis without bleeding: Secondary | ICD-10-CM | POA: Diagnosis not present

## 2020-04-14 DIAGNOSIS — E785 Hyperlipidemia, unspecified: Secondary | ICD-10-CM | POA: Diagnosis not present

## 2020-04-14 DIAGNOSIS — M1990 Unspecified osteoarthritis, unspecified site: Secondary | ICD-10-CM | POA: Diagnosis not present

## 2020-04-14 DIAGNOSIS — I10 Essential (primary) hypertension: Secondary | ICD-10-CM | POA: Diagnosis not present

## 2020-04-14 DIAGNOSIS — M150 Primary generalized (osteo)arthritis: Secondary | ICD-10-CM | POA: Diagnosis not present

## 2020-04-14 DIAGNOSIS — C50911 Malignant neoplasm of unspecified site of right female breast: Secondary | ICD-10-CM | POA: Diagnosis not present

## 2020-05-02 ENCOUNTER — Encounter: Payer: Self-pay | Admitting: Sports Medicine

## 2020-05-02 ENCOUNTER — Other Ambulatory Visit: Payer: Self-pay

## 2020-05-02 ENCOUNTER — Ambulatory Visit (INDEPENDENT_AMBULATORY_CARE_PROVIDER_SITE_OTHER): Payer: Medicare Other | Admitting: Sports Medicine

## 2020-05-02 VITALS — Temp 96.6°F

## 2020-05-02 DIAGNOSIS — M79671 Pain in right foot: Secondary | ICD-10-CM | POA: Diagnosis not present

## 2020-05-02 DIAGNOSIS — I6523 Occlusion and stenosis of bilateral carotid arteries: Secondary | ICD-10-CM | POA: Diagnosis not present

## 2020-05-02 DIAGNOSIS — Q828 Other specified congenital malformations of skin: Secondary | ICD-10-CM

## 2020-05-02 DIAGNOSIS — L989 Disorder of the skin and subcutaneous tissue, unspecified: Secondary | ICD-10-CM

## 2020-05-02 NOTE — Progress Notes (Signed)
Subjective: Barbara Rangel is a 74 y.o. female patient who returns to office for  Follow up evaluation of right greater than left foot pain secondary to callus skin. Patient reports that callus feels pretty good with a little soreness that she noticed on last week but otherwise has been doing much better.  No new issues or changes with medication or health history since last encounter. Patient denies any other pedal complaints.   Patient Active Problem List   Diagnosis Date Noted  . Carotid artery stenosis 01/05/2020  . Neck pain 03/30/2018  . Osteopenia determined by x-ray 08/28/2016  . History of right breast cancer 08/28/2016  . Malignant neoplasm of right female breast ([facility]) 01/23/2016  . History of chemotherapy 01/23/2016  . Estrogen deficiency 07/29/2015  . 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/24/2015  . Dense breast tissue 01/24/2015  . Hx of adenomatous colonic polyps 01/24/2015  . Plantar fasciitis, bilateral 01/24/2015  . Breast cancer, right breast ([facility]) 07/15/2013  . Syncope 09/15/2012  . Hypokalemia 09/15/2012  . Chronic back pain   . Chronic urinary tract infection   . Hypertension   . Arthritis   . Synovial cyst of lumbar facet joint 11/01/2011    Current Outpatient Medications on File Prior to Visit  Medication Sig Dispense Refill  . acetaminophen (TYLENOL) 500 MG tablet Take 1,000 mg by mouth at bedtime.    Marland Kitchen amLODipine (NORVASC) 5 MG tablet Take 5 mg by mouth at bedtime.     Marland Kitchen aspirin EC 81 MG tablet Take 81 mg by mouth every morning.     Marland Kitchen atorvastatin (LIPITOR) 20 MG tablet Take 1 tablet by mouth every morning.    Marland Kitchen azelastine (ASTELIN) 0.1 % nasal spray   0  . calcium citrate-vitamin D (CITRACAL+D) 315-200 MG-UNIT per tablet Take 2 tablets by mouth 2 (two) times daily.     . Cranberry Extract 250 MG TABS Take 1 capsule by mouth at bedtime.     . dorzolamide-timolol (COSOPT) 22.3-6.8 MG/ML ophthalmic solution 1 drop 2 (two) times daily.    . famotidine  (PEPCID) 20 MG tablet TK 1 T PO QD HS PRN    . latanoprost (XALATAN) 0.005 % ophthalmic solution Place 1 drop into both eyes at bedtime.      Marland Kitchen levocetirizine (XYZAL) 5 MG tablet Take 5 mg by mouth daily.    Marland Kitchen LORazepam (ATIVAN) 0.5 MG tablet TAKE 1/2 TO 1 TABLET BY MOUTH TWICE DAILY AS NEEDED ONLY WHEN NEEDED    . Multiple Vitamins-Minerals (MULTIVITAMINS THER. W/MINERALS) TABS Take 1 tablet by mouth every morning.     Marland Kitchen omeprazole (PRILOSEC) 40 MG capsule Take 40 mg by mouth every other day.     . triamcinolone (NASACORT ALLERGY 24HR) 55 MCG/ACT AERO nasal inhaler Place 2 sprays into the nose as needed.      No current facility-administered medications on file prior to visit.    Allergies  Allergen Reactions  . Nitrous Oxide Other (See Comments)    Pt passed out and had confusion. Took a few hours to wake her up.  . Avelox [Moxifloxacin Hcl In Nacl] Other (See Comments)    Severe headache   . Cephalexin Other (See Comments)    Caused headache  . Other Other (See Comments)    Anesthesia, needs antiemetic med  . Quinolones Other (See Comments)    Headaches?  . Tramadol Nausea Only and Other (See Comments)    Nausea and Dizziness, too.  . Trimethoprim Other (See  Comments)    Rapid heartbeat.  . Citalopram Hydrobromide Other (See Comments)    Pt can't remember what the side effect was.    Objective:  General: Alert and oriented x3 in no acute distress  Dermatology: Keratotic lesion present sub-met 5 on right and bilateral hallux with skin lines transversing the lesion, pain is present with direct pressure to the lesion with a central nucleated core noted, no webspace macerations, no ecchymosis bilateral, all nails x 10 are well manicured.  Vascular: Dorsalis Pedis and Posterior Tibial pedal pulses 1/4, Capillary Fill Time 3 seconds, + pedal hair growth bilateral, no edema bilateral lower extremities, Temperature gradient within normal limits.  Neurology: Johney Maine sensation intact  via light touch bilateral.  Musculoskeletal: Mild tenderness with palpation at the keratotic lesion site on Right greater than left, fat pad atrophy bunion and hammertoe deformity pes planus foot type with pain to right sub-met 5 at area of nucleation as noted above.  Muscular strength 5/5 in all groups without pain or limitation on range of motion.   Assessment and Plan: Problem List Items Addressed This Visit    None    Visit Diagnoses    Porokeratosis    -  Primary   Benign skin lesion       Right foot pain         -Complete examination performed -Discussed treatment options -At no additional charge/courtesy, Parred keratoic lesion using a chisel blade x1; treated the area withSalinocaine covered with Band-Aid -Dispensed offloading padding to use as instructed -Encouraged daily skin emollients; gave sample of foot miracle cream to use as instructed -Encouraged use of pumice stone for any additional buildup -Advised good supportive shoes and inserts like before -Patient to return to office in 3  months or as needed or sooner if condition worsens.  Landis Martins, DPM

## 2020-05-31 ENCOUNTER — Telehealth: Payer: Self-pay | Admitting: Hematology and Oncology

## 2020-05-31 DIAGNOSIS — R350 Frequency of micturition: Secondary | ICD-10-CM | POA: Diagnosis not present

## 2020-05-31 DIAGNOSIS — B373 Candidiasis of vulva and vagina: Secondary | ICD-10-CM | POA: Diagnosis not present

## 2020-05-31 NOTE — Telephone Encounter (Signed)
Rescheduled appointment per 7/1 message. Left message on patient's voicemail with updated appointment date and time.

## 2020-06-05 DIAGNOSIS — M1990 Unspecified osteoarthritis, unspecified site: Secondary | ICD-10-CM | POA: Diagnosis not present

## 2020-06-05 DIAGNOSIS — M150 Primary generalized (osteo)arthritis: Secondary | ICD-10-CM | POA: Diagnosis not present

## 2020-06-05 DIAGNOSIS — E785 Hyperlipidemia, unspecified: Secondary | ICD-10-CM | POA: Diagnosis not present

## 2020-06-05 DIAGNOSIS — I10 Essential (primary) hypertension: Secondary | ICD-10-CM | POA: Diagnosis not present

## 2020-06-05 DIAGNOSIS — C50911 Malignant neoplasm of unspecified site of right female breast: Secondary | ICD-10-CM | POA: Diagnosis not present

## 2020-06-08 DIAGNOSIS — L814 Other melanin hyperpigmentation: Secondary | ICD-10-CM | POA: Diagnosis not present

## 2020-06-08 DIAGNOSIS — D225 Melanocytic nevi of trunk: Secondary | ICD-10-CM | POA: Diagnosis not present

## 2020-06-08 DIAGNOSIS — L700 Acne vulgaris: Secondary | ICD-10-CM | POA: Diagnosis not present

## 2020-06-08 DIAGNOSIS — D485 Neoplasm of uncertain behavior of skin: Secondary | ICD-10-CM | POA: Diagnosis not present

## 2020-06-08 DIAGNOSIS — D2271 Melanocytic nevi of right lower limb, including hip: Secondary | ICD-10-CM | POA: Diagnosis not present

## 2020-06-08 DIAGNOSIS — D1801 Hemangioma of skin and subcutaneous tissue: Secondary | ICD-10-CM | POA: Diagnosis not present

## 2020-06-08 DIAGNOSIS — L905 Scar conditions and fibrosis of skin: Secondary | ICD-10-CM | POA: Diagnosis not present

## 2020-06-08 DIAGNOSIS — L821 Other seborrheic keratosis: Secondary | ICD-10-CM | POA: Diagnosis not present

## 2020-06-27 DIAGNOSIS — H401131 Primary open-angle glaucoma, bilateral, mild stage: Secondary | ICD-10-CM | POA: Diagnosis not present

## 2020-06-27 DIAGNOSIS — Z961 Presence of intraocular lens: Secondary | ICD-10-CM | POA: Diagnosis not present

## 2020-06-27 DIAGNOSIS — H2513 Age-related nuclear cataract, bilateral: Secondary | ICD-10-CM | POA: Diagnosis not present

## 2020-06-27 DIAGNOSIS — H5211 Myopia, right eye: Secondary | ICD-10-CM | POA: Diagnosis not present

## 2020-06-28 ENCOUNTER — Ambulatory Visit: Payer: Medicare Other | Attending: Obstetrics | Admitting: Physical Therapy

## 2020-06-28 ENCOUNTER — Other Ambulatory Visit: Payer: Self-pay

## 2020-06-28 ENCOUNTER — Encounter: Payer: Self-pay | Admitting: Physical Therapy

## 2020-06-28 DIAGNOSIS — M6281 Muscle weakness (generalized): Secondary | ICD-10-CM | POA: Diagnosis not present

## 2020-06-28 DIAGNOSIS — R252 Cramp and spasm: Secondary | ICD-10-CM | POA: Insufficient documentation

## 2020-06-28 NOTE — Patient Instructions (Addendum)
Moisturizers . They are used in the vagina to hydrate the mucous membrane that make up the vaginal canal. . Designed to keep a more normal acid balance (ph) . Once placed in the vagina, it will last between two to three days.  . Use 2-3 times per week at bedtime  . Ingredients to avoid is glycerin and fragrance, can increase chance of infection . Should not be used just before sex due to causing irritation . Most are gels administered either in a tampon-shaped applicator or as a vaginal suppository. They are non-hormonal.   Types of Moisturizers  . Vitamin E vaginal suppositories- Whole foods, Amazon . Moist Again . Coconut oil- can break down condoms . Julva- (Do no use if on Tamoxifen) amazon . Yes moisturizer- amazon . NeuEve Silk , NeuEve Silver for menopausal or over 65 (if have severe vaginal atrophy or cancer treatments use NeuEve Silk for  1 month than move to [employer])- [employer], MapleFlower.dk . Olive and Bee intimate cream- www.oliveandbee.com.au . Mae vaginal [place] . Aloe .    Creams to use externally on the Vulva area  Albertson's (good for for cancer patients that had radiation to the area)- [place] or [employer].FlyingBasics.com.br  V-magic cream - amazon  Julva-amazon  Vital "V Wild Yam salve ( help moisturize and help with thinning vulvar area, does have [place] by Irwin Brakeman labial moisturizer ([place],   Coconut or olive oil  aloe   Things to avoid in the vaginal area . Do not use things to irritate the vulvar area . No lotions just specialized creams for the vulva area- Neogyn, V-magic, No soaps; can use Aveeno or Calendula cleanser if needed. Must be gentle . No deodorants . No douches . Good to sleep without underwear to let the vaginal area to air out . No scrubbing: spread the lips to let warm water rinse over labias and pat dry  [facility] [address], [place], [place] [address] Phone # [phone] Fax [phone]  Access Code: [email or web address] URL: [email or web address] Date: 06/28/2020 Prepared by: Jari Favre  Exercises Supine Diaphragmatic Breathing - 2 x daily - 7 x weekly - 1 sets - 10 reps

## 2020-06-28 NOTE — Therapy (Signed)
[facility] [address], [place], [place], [address] Phone: [phone]   Fax:  [phone]  Physical Therapy Evaluation  Patient Details  Name: Barbara Rangel MRN: [id number] Date of Birth: 1946/04/29 Referring Provider (PT): Aloha Gell, MD   Encounter Date: 06/28/2020   PT End of Session - 06/28/20 1502    Visit Number 1    Date for PT Re-Evaluation 09/20/20    Authorization Type medicare    PT Start Time 6314    PT Stop Time 9702    PT Time Calculation (min) 51 min    Activity Tolerance Patient tolerated treatment well    Behavior During Therapy [facility] for tasks assessed/performed           Past Medical History:  Diagnosis Date   Arthritis    degenerative arthritis   Breast cancer, right breast ([place]) 04/2004   T2N1 diagnosed June 2005, ER PR + and Her 2 negative, post mastectomy with axillary node dissection (possibly 9 or 11 nodes removed, with possibly 7 or 9 involved) then treatment on NSABP B28 with dose dense adriamycin cytoxan followed by taxol, all of this Rx  in [place]. Adjuvant arimidex begun Jan 2006. No radiation.    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   Other and unspecified general anesthetics causing adverse effect in therapeutic use    hard to wake up   PONV (postoperative nausea and vomiting)    Syncope 09/15/2012    Past Surgical History:  Procedure Laterality Date   ANKLE SURGERY  2011   left ankle with screws and plates   BREAST LUMPECTOMY     x2   cataract surgery  2005   right eye   COLONOSCOPY     COLONOSCOPY WITH PROPOFOL N/A 10/11/2014   Procedure:  COLONOSCOPY WITH PROPOFOL;  Surgeon: Garlan Fair, MD;  Location: [facility];  Service: Endoscopy;  Laterality: N/A;   EYE SURGERY  2004   right eye-detached retina   left breast biopsy  2009   LUMBAR LAMINECTOMY/DECOMPRESSION MICRODISCECTOMY  10/31/2011   Procedure: LUMBAR LAMINECTOMY/DECOMPRESSION MICRODISCECTOMY;  Surgeon: Dahlia Bailiff;  Location: [place];  Service: Orthopedics;  Laterality: Right;  L4-5 Decompression with Right Facet Decompression    MASTECTOMY  2005   right  d/t breast cancer;no sticks to right arm LYMPH NODES REMOVED.   RETINAL DETACHMENT SURGERY  2004   right breast biopsy  2005   TUBAL LIGATION  1980    There were no vitals filed for this visit.    Subjective Assessment - 06/28/20 1405    Subjective Pt had breast cancer years ago and was finished with treatment in 2005.  Pt was on estrogen lowering medicine but has been off for about a year now.  Pt has had pain and dryness and back pain.    Limitations Sitting    How long can you sit comfortably? 15-30 minutes    Patient Stated Goals reduce vaginal dryness, reduce pain with intercourse, reduce back pain    Currently in Pain? Yes    Pain Score 5    gets up to where cannot  sit; uses douhnut pillow   Pain Location Coccyx    Pain Orientation Mid    Pain Descriptors / Indicators Sore    Pain Type Chronic pain    Pain Radiating Towards low back    Pain Onset More than a month ago    Pain Frequency Intermittent    Aggravating Factors  making the bed    Pain Relieving Factors doughnut pillow, back exercises on the floor    Multiple Pain Sites No              [facility] PT Assessment - 06/28/20 0001      Assessment   Medical Diagnosis R10.2 (ICD-10-CM) - Pelvic pain    Referring Provider (PT) Aloha Gell, MD    Onset Date/Surgical Date --   2005   Prior Therapy Yes many years ago      Precautions   Precautions None      Restrictions   Weight Bearing Restrictions No      Balance Screen    Has the patient fallen in the past 6 months No      [place] residence    Living Arrangements Spouse/significant other      Prior Function   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   Overall Cognitive Status Within Functional Limits for tasks assessed      Posture/Postural Control   Posture/Postural Control Postural limitations    Postural Limitations Posterior pelvic tilt;Rounded Shoulders      ROM / Strength   AROM / PROM / Strength PROM;Strength      PROM   Overall PROM Comments hip flexion 80%; hip IR 50%      Strength   Overall Strength Comments hip abduction 4/5 bil      Flexibility   Soft Tissue Assessment /Muscle Length yes    Hamstrings WFL      Ambulation/Gait   Gait Pattern Within Functional Limits                      Objective measurements completed on examination: See above findings.     Pelvic Floor Special Questions - 06/28/20 0001    Prior Pelvic/Prostate Exam Yes    Prior Pregnancies Yes    Number of Pregnancies 4    Number of Vaginal Deliveries 4    Episiotomy Performed Yes    Currently Sexually Active Yes    Is this Painful Yes    Marinoff Scale pain prevents any attempts at intercourse    Urinary Leakage No    Urinary urgency Yes    Urinary frequency I go a lot; 1/hr to 30 minutes; nocturia sometimes 1x    Fluid intake 80-90 oz    Caffeine beverages no    Skin Integrity Irritaion present at    Skin Integrity Irritation Present at dryness and red indroitus    Perineal Body/Introitus  Descended    Pelvic Floor Internal Exam pt identity confirmed for internal soft tissue assessment    Exam Type Vaginal    Strength weak squeeze, no lift    Strength # of reps 1    Strength # of seconds 5    Tone stiff muscle tissue            [facility] Adult PT Treatment/Exercise - 06/28/20 0001      Self-Care   Self-Care Other Self-Care Comments  Other Self-Care Comments  intial HEP self stretch and moisture and breathing                  PT Education - 06/28/20 1435    Education Details Access Code: [email or web address] and moistures    Person(s) Educated Patient    Methods Explanation;Demonstration;Handout    Comprehension Verbalized understanding;Returned demonstration            PT Short Term Goals - 06/28/20 1513      PT SHORT TERM GOAL #1   Title ind with initial HEP    Time 4    Period Weeks    Status New    Target Date 07/26/20      PT SHORT TERM GOAL #2   Title Pt will be able to work on HEP on her own for 2-3 weeks to ensure she is ind    Time 4    Period Weeks    Status New    Target Date 07/26/20             PT Long Term Goals - 06/28/20 1503      PT LONG TERM GOAL #1   Title Pt will be ind with final HEP    Time 12    Period Weeks    Status New    Target Date 09/20/20      PT LONG TERM GOAL #2   Title Pt will be ind with dilators for stretching tight muscles to reduce pain and be able to tolerate intercourse with her husband    Time 12    Period Weeks    Status New    Target Date 09/20/20      PT LONG TERM GOAL #3   Title Pt will report reduced frequency of urination to 1-2 hours    Time 12    Period Weeks    Status New    Target Date 09/20/20      PT LONG TERM GOAL #4   Title Marinoff scale 2/3    Time 12    Period Weeks    Status New    Target Date 09/20/20                  Plan - 06/28/20 1514    Clinical Impression Statement Pt presents to clinic due to vaginal pain, dysparuenia and low back/coccyx pain.  Pt has tension in hips and lumbar as mentioned.  Pt has vaginal dryness and pelvic floor weakness.  Due to reduced estrogen the soft tissue has been comprimised and will benefit from stretches, exercises, and self care techniques to restore function as mentioned.  Pt will benefit from skilled PT to address impairments and work towards functional goals    Personal  Factors and Comorbidities Age;Comorbidity 2;Time since onset of injury/illness/exacerbation    Comorbidities 4 vaginal deliveries with episiotomy; breast cancer estrogen+;    Examination-Activity Limitations Sit    Examination-Participation Restrictions Interpersonal Relationship    Stability/Clinical Decision Making Stable/Uncomplicated    Clinical Decision Making Low    Rehab Potential Excellent    PT Frequency 1x / week    PT Duration 12 weeks   space out after several visits to check in   PT Treatment/Interventions ADLs/Self Care Home Management;Biofeedback;Moist Heat;Electrical Stimulation;Cryotherapy;Therapeutic activities;Therapeutic exercise;Neuromuscular re-education;Patient/family education;Manual techniques;Taping;Passive range of motion;Dry needling    PT Next Visit Plan dilators, stretching and breathing into pelvic floor, internal STM    PT Home Exercise Plan moisturizers and Access Code: [email or web address]  Consulted and Agree with Plan of Care Patient           Patient will benefit from skilled therapeutic intervention in order to improve the following deficits and impairments:  Pain, Postural dysfunction, Increased fascial restricitons, Decreased strength, Decreased coordination, Increased muscle spasms, Decreased skin integrity, Decreased range of motion  Visit Diagnosis: Cramp and spasm  Muscle weakness (generalized)     Problem List Patient Active Problem List   Diagnosis Date Noted   Carotid artery stenosis 01/05/2020   Neck pain 03/30/2018   Osteopenia determined by x-ray 08/28/2016   History of right breast cancer 08/28/2016   Malignant neoplasm of right female breast ([place]) 01/23/2016   History of chemotherapy 01/23/2016   Estrogen deficiency 07/29/2015   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/24/2015   Dense breast tissue 01/24/2015   Hx of adenomatous colonic polyps 01/24/2015   Plantar fasciitis, bilateral 01/24/2015   Breast cancer, right breast  ([place]) 07/15/2013   Syncope 09/15/2012   Hypokalemia 09/15/2012   Chronic back pain    Chronic urinary tract infection    Hypertension    Arthritis    Synovial cyst of lumbar facet joint 11/01/2011    Camillo Flaming Chyna Kneece, PT 06/28/2020, 5:58 PM  [facility] [address] [place], [place], [address] Phone: [phone]   Fax:  [phone]  Name: CHRISTYANN MANOLIS MRN: [id number] Date of Birth: 01/08/1946

## 2020-07-04 ENCOUNTER — Ambulatory Visit: Payer: Medicare Other | Admitting: Physical Therapy

## 2020-07-04 ENCOUNTER — Encounter: Payer: Self-pay | Admitting: Physical Therapy

## 2020-07-04 ENCOUNTER — Other Ambulatory Visit: Payer: Self-pay

## 2020-07-04 DIAGNOSIS — M6281 Muscle weakness (generalized): Secondary | ICD-10-CM | POA: Diagnosis not present

## 2020-07-04 DIAGNOSIS — R252 Cramp and spasm: Secondary | ICD-10-CM

## 2020-07-04 NOTE — Patient Instructions (Signed)
PROTOCOL FOR VAGINAL DILATORS   1. Wash dilator with soap and water prior to insertion.    2. Lay on your back reclined. Knees are to be up and apart while on your bed or in the bathtub with warm water.   3. Lubricate the end of the dilator with a water-soluble lubricant.  4. Separate the labia.   5. Tense the pelvic floor muscles than relax; while relaxing, slide lubricated dilator( round side of dilator) into the vagina.  Insert toward the direction of your spine. Dilator should feel snug and no pain more than 3/10.   6. Tense muscles again while holding the dilator so it does not get pushed out; relax and slide it in a little further.   7. Try blowing out as if filling a balloon; this may relax the muscles and allow penetration.  Repeat blowing out to insert dilator further.  8. Keep dilator in for 10 minutes if tolerate, with the pelvic floor muscles relaxed to further stretch the canal.   9. Never force the dilator into the canal. 10. Once the dilator is comfortable start to move in and out, side to side, move your hips in different directions  11. 3-4 times per week 12. Progression of dilator.  a. When you are able to place dilator into vaginal canal and feel no pain or able to move without difficulty you are ready for the next size.  b. Before you go to the next size start with the original size for 2 minutes then use the next size up for 5 minutes. c. When the next size up is easy to use, do not have to start with the smaller size.

## 2020-07-04 NOTE — Therapy (Signed)
[facility] [address], [place], [place], [address] Phone: [phone]   Fax:  [phone]  Physical Therapy Treatment  Patient Details  Name: Barbara Rangel MRN: [id number] Date of Birth: 1946/06/12 Referring Provider (PT): Aloha Gell, MD   Encounter Date: 07/04/2020   PT End of Session - 07/04/20 1359    Visit Number 2    Date for PT Re-Evaluation 09/20/20    Authorization Type medicare    PT Start Time 1231    PT Stop Time 1318    PT Time Calculation (min) 47 min    Activity Tolerance Patient tolerated treatment well    Behavior During Therapy [facility] for tasks assessed/performed           Past Medical History:  Diagnosis Date   Arthritis    degenerative arthritis   Breast cancer, right breast ([place]) 04/2004   T2N1 diagnosed June 2005, ER PR + and Her 2 negative, post mastectomy with axillary node dissection (possibly 9 or 11 nodes removed, with possibly 7 or 9 involved) then treatment on NSABP B28 with dose dense adriamycin cytoxan followed by taxol, all of this Rx  in [place]. Adjuvant arimidex begun Jan 2006. No radiation.    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   Other and unspecified general anesthetics causing adverse effect in therapeutic use    hard to wake up   PONV (postoperative nausea and vomiting)    Syncope 09/15/2012    Past Surgical History:  Procedure Laterality Date   ANKLE SURGERY  2011   left ankle with screws and plates   BREAST LUMPECTOMY     x2   cataract surgery  2005   right eye   COLONOSCOPY     COLONOSCOPY WITH PROPOFOL N/A 10/11/2014   Procedure:  COLONOSCOPY WITH PROPOFOL;  Surgeon: Garlan Fair, MD;  [facility];  Service: Endoscopy;  Laterality: N/A;   EYE SURGERY  2004   right eye-detached retina   left breast biopsy  2009   LUMBAR LAMINECTOMY/DECOMPRESSION MICRODISCECTOMY  10/31/2011   Procedure: LUMBAR LAMINECTOMY/DECOMPRESSION MICRODISCECTOMY;  Surgeon: Dahlia Bailiff;  Location: [place];  Service: Orthopedics;  Laterality: Right;  L4-5 Decompression with Right Facet Decompression    MASTECTOMY  2005   right  d/t breast cancer;no sticks to right arm LYMPH NODES REMOVED.   RETINAL DETACHMENT SURGERY  2004   right breast biopsy  2005   TUBAL LIGATION  1980    There were no vitals filed for this visit.   Subjective Assessment - 07/04/20 1359    Subjective No changes has been doing the breathing    Patient Stated Goals reduce vaginal dryness, reduce pain with intercourse, reduce back pain    Currently in Pain? No/denies                             [facility] Adult PT Treatment/Exercise - 07/04/20 0001      Self-Care   Self-Care Other Self-Care Comments    Other Self-Care Comments  dilators      Exercises  Exercises Other Exercises    Other Exercises  reviewed stretches      Manual Therapy   Manual Therapy Soft tissue mobilization;Internal Pelvic Floor    Manual therapy comments pt identity confirmed and internal soft tissue performed    Internal Pelvic Floor levators and bulbocavernosis; fascial release Lt>Rt                  PT Education - 07/04/20 1355    Education Details stretches and dilators    Person(s) Educated Patient    Methods Explanation;Demonstration;Tactile cues;Verbal cues;Handout    Comprehension Verbalized understanding;Returned demonstration            PT Short Term Goals - 06/28/20 1513      PT SHORT TERM GOAL #1   Title ind with initial HEP    Time 4    Period Weeks    Status New    Target Date 07/26/20      PT SHORT TERM GOAL #2    Title Pt will be able to work on HEP on her own for 2-3 weeks to ensure she is ind    Time 4    Period Weeks    Status New    Target Date 07/26/20             PT Long Term Goals - 06/28/20 1503      PT LONG TERM GOAL #1   Title Pt will be ind with final HEP    Time 12    Period Weeks    Status New    Target Date 09/20/20      PT LONG TERM GOAL #2   Title Pt will be ind with dilators for stretching tight muscles to reduce pain and be able to tolerate intercourse with her husband    Time 12    Period Weeks    Status New    Target Date 09/20/20      PT LONG TERM GOAL #3   Title Pt will report reduced frequency of urination to 1-2 hours    Time 12    Period Weeks    Status New    Target Date 09/20/20      PT LONG TERM GOAL #4   Title Marinoff scale 2/3    Time 12    Period Weeks    Status New    Target Date 09/20/20                 Plan - 07/04/20 1357    Clinical Impression Statement Pt reviewed breathing and stretches.  She responded well to [facility] and had increase soft tissue length.  Pt was given more stretches to maintain improved soft tissue.  Pt was given education how to buy and use dilators.  She is recommended to continue per POC.    PT Treatment/Interventions ADLs/Self Care Home Management;Biofeedback;Moist Heat;Electrical Stimulation;Cryotherapy;Therapeutic activities;Therapeutic exercise;Neuromuscular re-education;Patient/family education;Manual techniques;Taping;Passive range of motion;Dry needling    PT Next Visit Plan f/u on dilators, stretching and breathing into pelvic floor, internal STM    PT Home Exercise Plan moisturizers and Access Code: [email or web address]    Consulted and Agree with Plan of Care Patient           Patient will benefit from skilled therapeutic intervention in order to improve the following deficits and impairments:  Pain, Postural dysfunction, Increased fascial restricitons, Decreased strength, Decreased coordination, Increased muscle  spasms, Decreased skin integrity, Decreased range of motion  Visit Diagnosis: Cramp and spasm  Muscle  weakness (generalized)     Problem List Patient Active Problem List   Diagnosis Date Noted   Carotid artery stenosis 01/05/2020   Neck pain 03/30/2018   Osteopenia determined by x-ray 08/28/2016   History of right breast cancer 08/28/2016   Malignant neoplasm of right female breast ([place]) 01/23/2016   History of chemotherapy 01/23/2016   Estrogen deficiency 07/29/2015   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/24/2015   Dense breast tissue 01/24/2015   Hx of adenomatous colonic polyps 01/24/2015   Plantar fasciitis, bilateral 01/24/2015   Breast cancer, right breast ([place]) 07/15/2013   Syncope 09/15/2012   Hypokalemia 09/15/2012   Chronic back pain    Chronic urinary tract infection    Hypertension    Arthritis    Synovial cyst of lumbar facet joint 11/01/2011    Camillo Flaming Beronica [place], PT 07/04/2020, 2:00 PM  [facility] [address] [place], [place], [address] Phone: [phone]   Fax:  [phone]  Name: Barbara Rangel MRN: [id number] Date of Birth: Jul 17, 1946

## 2020-07-11 ENCOUNTER — Ambulatory Visit: Payer: Medicare Other | Admitting: Physical Therapy

## 2020-07-11 ENCOUNTER — Other Ambulatory Visit: Payer: Self-pay

## 2020-07-11 ENCOUNTER — Encounter: Payer: Self-pay | Admitting: Physical Therapy

## 2020-07-11 DIAGNOSIS — M6281 Muscle weakness (generalized): Secondary | ICD-10-CM | POA: Diagnosis not present

## 2020-07-11 DIAGNOSIS — R252 Cramp and spasm: Secondary | ICD-10-CM

## 2020-07-11 NOTE — Therapy (Signed)
[facility] [address], [place], [place], [address] Phone: [phone]   Fax:  [phone]  Physical Therapy Treatment  Patient Details  Name: Barbara Rangel MRN: [id number] Date of Birth: August 18, 1946 Referring Provider (PT): Aloha Gell, MD   Encounter Date: 07/11/2020   PT End of Session - 07/11/20 1151    Visit Number 3    Date for PT Re-Evaluation 09/20/20    Authorization Type medicare    PT Start Time 1148    PT Stop Time 1228    PT Time Calculation (min) 40 min    Activity Tolerance Patient tolerated treatment well    Behavior During Therapy [facility] for tasks assessed/performed           Past Medical History:  Diagnosis Date  . Arthritis    degenerative arthritis  . Breast cancer, right breast ([place]) 04/2004   T2N1 diagnosed June 2005, ER PR + and Her 2 negative, post mastectomy with axillary node dissection (possibly 9 or 11 nodes removed, with possibly 7 or 9 involved) then treatment on NSABP B28 with dose dense adriamycin cytoxan followed by taxol, all of this Rx  in [place]. Adjuvant arimidex begun Jan 2006. No radiation.   . Chronic back pain    right radicular leg pain  . Chronic urinary tract infection    takes Macrodantin and Cranberry daily  . Constipation    r/t pain meds and takes Dulcolax nightly  . Glaucoma    uses Xalantan nightly  . Hypertension    takes Amlodipine daily  . Hypokalemia 09/15/2012  . 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/24/2015  . Other and unspecified general anesthetics causing adverse effect in therapeutic use    hard to wake up  . PONV (postoperative nausea and vomiting)   . Syncope 09/15/2012    Past Surgical History:  Procedure Laterality Date  . ANKLE SURGERY  2011   left ankle with screws and plates  . BREAST LUMPECTOMY     x2  . cataract surgery  2005   right eye  . COLONOSCOPY    . COLONOSCOPY WITH PROPOFOL N/A 10/11/2014   Procedure:  COLONOSCOPY WITH PROPOFOL;  Surgeon: Garlan Fair, MD;  Location: [facility];  Service: Endoscopy;  Laterality: N/A;  . EYE SURGERY  2004   right eye-detached retina  . left breast biopsy  2009  . LUMBAR LAMINECTOMY/DECOMPRESSION MICRODISCECTOMY  10/31/2011   Procedure: LUMBAR LAMINECTOMY/DECOMPRESSION MICRODISCECTOMY;  Surgeon: Dahlia Bailiff;  Location: [facility];  Service: Orthopedics;  Laterality: Right;  L4-5 Decompression with Right Facet Decompression   . MASTECTOMY  2005   right  d/t breast cancer;no sticks to right arm LYMPH NODES REMOVED.  Marland Kitchen RETINAL DETACHMENT SURGERY  2004  . right breast biopsy  2005  . TUBAL LIGATION  1980    There were no vitals filed for this visit.   Subjective Assessment - 07/11/20 1237    Subjective I haven't used the dilators.  I have done the other stretches and using my thumb to stretch the pelvic floor.    Patient Stated Goals reduce vaginal dryness, reduce pain with intercourse, reduce back pain    Currently in Pain? No/denies                             [facility] Adult PT Treatment/Exercise - 07/11/20 0001      Self-Care   Other Self-Care Comments  dilators  Manual Therapy   Manual Therapy Soft tissue mobilization;Internal Pelvic Floor    Manual therapy comments pt identity confirmed and internal soft tissue performed    Internal Pelvic Floor levators, ischiocavernosis, and bulbocavernosis; fascial release Lt>Rt                    PT Short Term Goals - 07/11/20 1236      PT SHORT TERM GOAL #1   Title ind with initial HEP    Status Achieved      PT SHORT TERM GOAL #2   Title Pt will be able to work on HEP on her own for 2-3 weeks to ensure she is ind    Baseline has not used dilators yet    Status On-going             PT Long Term Goals - 07/11/20 1235      PT LONG TERM GOAL #1   Title Pt will be ind with final HEP    Status On-going      PT LONG TERM GOAL #2   Title Pt will be ind with  dilators for stretching tight muscles to reduce pain and be able to tolerate intercourse with her husband    Status On-going      PT LONG TERM GOAL #3   Title Pt will report reduced frequency of urination to 1-2 hours    Status On-going      PT LONG TERM GOAL #4   Title Marinoff scale 2/3    Status On-going                 Plan - 07/11/20 1237    Clinical Impression Statement Pt has dilators and has not yet used.  Reviewed extensively how to start using dilators.  Pt got good release with STM internally today especially bulbocav and ischiocav on Lt side.  She had more fascial restrictions as well on Lt side.  Pt got releas from thoracolumbar STM today as well.  Pt will benefit from skilled PT to continue working towards fucntional goals.    Comorbidities 4 vaginal deliveries with episiotomy; breast cancer estrogen+;    PT Treatment/Interventions ADLs/Self Care Home Management;Biofeedback;Moist Heat;Electrical Stimulation;Cryotherapy;Therapeutic activities;Therapeutic exercise;Neuromuscular re-education;Patient/family education;Manual techniques;Taping;Passive range of motion;Dry needling    PT Next Visit Plan f/u on dilators, stretching and breathing into pelvic floor, internal STM    PT Home Exercise Plan moisturizers and Access Code: [email or web address]    Consulted and Agree with Plan of Care Patient           Patient will benefit from skilled therapeutic intervention in order to improve the following deficits and impairments:  Pain, Postural dysfunction, Increased fascial restricitons, Decreased strength, Decreased coordination, Increased muscle spasms, Decreased skin integrity, Decreased range of motion  Visit Diagnosis: Cramp and spasm  Muscle weakness (generalized)     Problem List Patient Active Problem List   Diagnosis Date Noted  . Carotid artery stenosis 01/05/2020  . Neck pain 03/30/2018  . Osteopenia determined by x-ray 08/28/2016  . History of right breast cancer  08/28/2016  . Malignant neoplasm of right female breast ([place]) 01/23/2016  . History of chemotherapy 01/23/2016  . Estrogen deficiency 07/29/2015  . 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/24/2015  . Dense breast tissue 01/24/2015  . Hx of adenomatous colonic polyps 01/24/2015  . Plantar fasciitis, bilateral 01/24/2015  . Breast cancer, right breast ([place]) 07/15/2013  . Syncope 09/15/2012  . Hypokalemia 09/15/2012  . Chronic back pain   .  Chronic urinary tract infection   . Hypertension   . Arthritis   . Synovial cyst of lumbar facet joint 11/01/2011    Jule Ser, PT 07/11/2020, 12:51 PM  [facility] [address] [place], [place], [address] Phone: [phone]   Fax:  [phone]  Name: Barbara Rangel MRN: [id number] Date of Birth: Aug 17, 1946

## 2020-07-18 ENCOUNTER — Other Ambulatory Visit: Payer: Self-pay

## 2020-07-18 ENCOUNTER — Encounter: Payer: Self-pay | Admitting: Physical Therapy

## 2020-07-18 ENCOUNTER — Ambulatory Visit: Payer: Medicare Other | Attending: Obstetrics | Admitting: Physical Therapy

## 2020-07-18 DIAGNOSIS — R252 Cramp and spasm: Secondary | ICD-10-CM

## 2020-07-18 DIAGNOSIS — M6281 Muscle weakness (generalized): Secondary | ICD-10-CM | POA: Insufficient documentation

## 2020-07-18 NOTE — Therapy (Addendum)
[facility] [address] [place], [place], [address] Phone: [phone]   Fax:  [phone]  Physical Therapy Treatment  Patient Details  Name: Barbara Rangel MRN: [id number] Date of Birth: 1946-02-22 Referring Provider (PT): Noland Fordyce, MD   Encounter Date: 07/18/2020   PT End of Session - 07/18/20 1234    Visit Number 4    Date for PT Re-Evaluation 09/20/20    Authorization Type medicare    PT Start Time 1228    PT Stop Time 1308    PT Time Calculation (min) 40 min    Activity Tolerance Patient tolerated treatment well    Behavior During Therapy [facility] for tasks assessed/performed           Past Medical History:  Diagnosis Date  . Arthritis    degenerative arthritis  . Breast cancer, right breast ([facility]) 04/2004   T2N1 diagnosed June 2005, ER PR + and Her 2 negative, post mastectomy with axillary node dissection (possibly 9 or 11 nodes removed, with possibly 7 or 9 involved) then treatment on NSABP B28 with dose dense adriamycin cytoxan followed by taxol, all of this Rx  in [place]. Adjuvant arimidex begun Jan 2006. No radiation.   . Chronic back pain    right radicular leg pain  . Chronic urinary tract infection    takes Macrodantin and Cranberry daily  . Constipation    r/t pain meds and takes Dulcolax nightly  . Glaucoma    uses Xalantan nightly  . Hypertension    takes Amlodipine daily  . Hypokalemia 09/15/2012  . 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/24/2015  . Other and unspecified general anesthetics causing adverse effect in therapeutic use    hard to wake up  . PONV (postoperative nausea and vomiting)   . Syncope 09/15/2012    Past Surgical History:  Procedure Laterality Date  . ANKLE SURGERY  2011   left ankle with screws and plates  . BREAST LUMPECTOMY     x2  . cataract surgery  2005   right eye  . COLONOSCOPY    . COLONOSCOPY WITH PROPOFOL N/A 10/11/2014   Procedure:  COLONOSCOPY WITH PROPOFOL;  Surgeon: Charolett Bumpers, MD;  [facility]: [facility];  Service: Endoscopy;  Laterality: N/A;  . EYE SURGERY  2004   right eye-detached retina  . left breast biopsy  2009  . LUMBAR LAMINECTOMY/DECOMPRESSION MICRODISCECTOMY  10/31/2011   Procedure: LUMBAR LAMINECTOMY/DECOMPRESSION MICRODISCECTOMY;  Surgeon: Alvy Beal;  Location: [facility];  Service: Orthopedics;  Laterality: Right;  L4-5 Decompression with Right Facet Decompression   . MASTECTOMY  2005   right  d/t breast cancer;no sticks to right arm LYMPH NODES REMOVED.  Marland Kitchen RETINAL DETACHMENT SURGERY  2004  . right breast biopsy  2005  . TUBAL LIGATION  1980    There were no vitals filed for this visit.   Subjective Assessment - 07/18/20 1320    Subjective I have been using the dilators and made it to the second one.    Currently in Pain? No/denies                             [facility] Adult PT Treatment/Exercise - 07/18/20 0001      Self-Care   Other Self-Care Comments  more education on things to do and doing stretches and while using the dilators      Manual Therapy   Manual Therapy Soft  tissue mobilization    Manual therapy comments pt identity confirmed and internal soft tissue performed    Soft tissue mobilization lumbar erectors and MFR to lumbar; traction with bilt LE in supine    Internal Pelvic Floor levators, ischiocavernosis, and bulbocavernosis; fascial release Lt>Rt                    PT Short Term Goals - 07/11/20 1236      PT SHORT TERM GOAL #1   Title ind with initial HEP    Status Achieved      PT SHORT TERM GOAL #2   Title Pt will be able to work on HEP on her own for 2-3 weeks to ensure she is ind    Baseline has not used dilators yet    Status On-going             PT Long Term Goals - 07/11/20 1235      PT LONG TERM GOAL #1   Title Pt will be ind with final HEP    Status On-going      PT LONG TERM GOAL #2   Title Pt will be ind with  dilators for stretching tight muscles to reduce pain and be able to tolerate intercourse with her husband    Status On-going      PT LONG TERM GOAL #3   Title Pt will report reduced frequency of urination to 1-2 hours    Status On-going      PT LONG TERM GOAL #4   Title Marinoff scale 2/3    Status On-going                 Plan - 07/18/20 1314    Clinical Impression Statement Pt was doing well with HEP using dilators.  She tolerated internal STM and could tolerate very firm pressure throughout.  Pt did not tolerate STM or MFR on lumbar erectors as much but she is doing stretches to release low back that she tolerates more easily.  She is limited by chronic LBP.  Pt will wait to see how to she continues to progress with her HEP before scheduling her next visit at this time.    PT Treatment/Interventions ADLs/Self Care Home Management;Biofeedback;Moist Heat;Electrical Stimulation;Cryotherapy;Therapeutic activities;Therapeutic exercise;Neuromuscular re-education;Patient/family education;Manual techniques;Taping;Passive range of motion;Dry needling    PT Next Visit Plan f/u on dilators, stretching and breathing into pelvic floor, internal STM    PT Home Exercise Plan moisturizers and Access Code: [email or web address]    Consulted and Agree with Plan of Care Patient           Patient will benefit from skilled therapeutic intervention in order to improve the following deficits and impairments:  Pain, Postural dysfunction, Increased fascial restricitons, Decreased strength, Decreased coordination, Increased muscle spasms, Decreased skin integrity, Decreased range of motion  Visit Diagnosis: Cramp and spasm  Muscle weakness (generalized)     Problem List Patient Active Problem List   Diagnosis Date Noted  . Carotid artery stenosis 01/05/2020  . Neck pain 03/30/2018  . Osteopenia determined by x-ray 08/28/2016  . History of right breast cancer 08/28/2016  . Malignant neoplasm of right  female breast ([place]) 01/23/2016  . History of chemotherapy 01/23/2016  . Estrogen deficiency 07/29/2015  . 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/24/2015  . Dense breast tissue 01/24/2015  . Hx of adenomatous colonic polyps 01/24/2015  . Plantar fasciitis, bilateral 01/24/2015  . Breast cancer, right breast ([place]) 07/15/2013  . Syncope 09/15/2012  .  Hypokalemia 09/15/2012  . Chronic back pain   . Chronic urinary tract infection   . Hypertension   . Arthritis   . Synovial cyst of lumbar facet joint 11/01/2011    Jule Ser, PT 07/18/2020, 2:44 PM  [facility] [address], [place], [place], [address] Phone: [phone]   Fax:  [phone]  Name: Barbara Rangel MRN: [id number] Date of Birth: October 25, 1946  PHYSICAL THERAPY DISCHARGE SUMMARY  Visits from Start of Care: 4  Current functional level related to goals / functional outcomes: See above goals   Remaining deficits: See above   Education / Equipment: HEP Plan: Patient agrees to discharge.  Patient goals were partially met. Patient is being discharged due to not returning since the last visit.  ?????     [employer], PT 10/30/20 2:45 PM

## 2020-07-19 DIAGNOSIS — I10 Essential (primary) hypertension: Secondary | ICD-10-CM | POA: Diagnosis not present

## 2020-07-19 DIAGNOSIS — C50911 Malignant neoplasm of unspecified site of right female breast: Secondary | ICD-10-CM | POA: Diagnosis not present

## 2020-07-19 DIAGNOSIS — M1990 Unspecified osteoarthritis, unspecified site: Secondary | ICD-10-CM | POA: Diagnosis not present

## 2020-07-19 DIAGNOSIS — E785 Hyperlipidemia, unspecified: Secondary | ICD-10-CM | POA: Diagnosis not present

## 2020-07-19 DIAGNOSIS — M150 Primary generalized (osteo)arthritis: Secondary | ICD-10-CM | POA: Diagnosis not present

## 2020-07-19 DIAGNOSIS — K219 Gastro-esophageal reflux disease without esophagitis: Secondary | ICD-10-CM | POA: Diagnosis not present

## 2020-07-21 DIAGNOSIS — K219 Gastro-esophageal reflux disease without esophagitis: Secondary | ICD-10-CM | POA: Diagnosis not present

## 2020-07-21 DIAGNOSIS — Z23 Encounter for immunization: Secondary | ICD-10-CM | POA: Diagnosis not present

## 2020-07-21 DIAGNOSIS — J301 Allergic rhinitis due to pollen: Secondary | ICD-10-CM | POA: Diagnosis not present

## 2020-07-21 DIAGNOSIS — F419 Anxiety disorder, unspecified: Secondary | ICD-10-CM | POA: Diagnosis not present

## 2020-07-22 DIAGNOSIS — Z03818 Encounter for observation for suspected exposure to other biological agents ruled out: Secondary | ICD-10-CM | POA: Diagnosis not present

## 2020-08-03 ENCOUNTER — Ambulatory Visit (INDEPENDENT_AMBULATORY_CARE_PROVIDER_SITE_OTHER): Payer: Medicare Other | Admitting: Sports Medicine

## 2020-08-03 ENCOUNTER — Other Ambulatory Visit: Payer: Self-pay

## 2020-08-03 ENCOUNTER — Encounter: Payer: Self-pay | Admitting: Sports Medicine

## 2020-08-03 DIAGNOSIS — L989 Disorder of the skin and subcutaneous tissue, unspecified: Secondary | ICD-10-CM

## 2020-08-03 DIAGNOSIS — M79671 Pain in right foot: Secondary | ICD-10-CM | POA: Diagnosis not present

## 2020-08-03 DIAGNOSIS — I6523 Occlusion and stenosis of bilateral carotid arteries: Secondary | ICD-10-CM

## 2020-08-03 DIAGNOSIS — Q828 Other specified congenital malformations of skin: Secondary | ICD-10-CM

## 2020-08-03 NOTE — Progress Notes (Signed)
Subjective: Barbara Rangel is a 74 y.o. female patient who returns to office for  Follow up evaluation of right greater than left foot pain secondary to callus skin. Patient reports that callus needs trimming again but otherwise is doing fine.  Patient denies any other pedal complaints.   Patient Active Problem List   Diagnosis Date Noted  . Carotid artery stenosis 01/05/2020  . Neck pain 03/30/2018  . Osteopenia determined by x-ray 08/28/2016  . History of right breast cancer 08/28/2016  . Malignant neoplasm of right female breast ([place]) 01/23/2016  . History of chemotherapy 01/23/2016  . Estrogen deficiency 07/29/2015  . 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/24/2015  . Dense breast tissue 01/24/2015  . Hx of adenomatous colonic polyps 01/24/2015  . Plantar fasciitis, bilateral 01/24/2015  . Breast cancer, right breast ([place]) 07/15/2013  . Syncope 09/15/2012  . Hypokalemia 09/15/2012  . Chronic back pain   . Chronic urinary tract infection   . Hypertension   . Arthritis   . Synovial cyst of lumbar facet joint 11/01/2011    Current Outpatient Medications on File Prior to Visit  Medication Sig Dispense Refill  . acetaminophen (TYLENOL) 500 MG tablet Take 1,000 mg by mouth at bedtime.    Marland Kitchen amLODipine (NORVASC) 5 MG tablet Take 5 mg by mouth at bedtime.     Marland Kitchen amoxicillin (AMOXIL) 500 MG tablet Take 500 mg by mouth 3 (three) times daily.    Marland Kitchen aspirin EC 81 MG tablet Take 81 mg by mouth every morning.     Marland Kitchen atorvastatin (LIPITOR) 20 MG tablet Take 1 tablet by mouth every morning.    Marland Kitchen azelastine (ASTELIN) 0.1 % nasal spray   0  . calcium citrate-vitamin D (CITRACAL+D) 315-200 MG-UNIT per tablet Take 2 tablets by mouth 2 (two) times daily.     . Cranberry Extract 250 MG TABS Take 1 capsule by mouth at bedtime.     . dorzolamide-timolol (COSOPT) 22.3-6.8 MG/ML ophthalmic solution 1 drop 2 (two) times daily.    . famotidine (PEPCID) 20 MG tablet TK 1 T PO QD HS PRN    . latanoprost  (XALATAN) 0.005 % ophthalmic solution Place 1 drop into both eyes at bedtime.      Marland Kitchen levocetirizine (XYZAL) 5 MG tablet Take 5 mg by mouth daily.    Marland Kitchen LORazepam (ATIVAN) 0.5 MG tablet TAKE 1/2 TO 1 TABLET BY MOUTH TWICE DAILY AS NEEDED ONLY WHEN NEEDED    . Multiple Vitamins-Minerals (MULTIVITAMINS THER. W/MINERALS) TABS Take 1 tablet by mouth every morning.     . nitrofurantoin, macrocrystal-monohydrate, (MACROBID) 100 MG capsule Take 100 mg by mouth 2 (two) times daily.    Marland Kitchen omeprazole (PRILOSEC) 40 MG capsule Take 40 mg by mouth every other day.     Marland Kitchen SHINGRIX injection     . terconazole (TERAZOL 3) 0.8 % vaginal cream INSERT 1 APPLICATORFUL VAGINALLY AT BEDTIME FOR 3 NIGHTS    . triamcinolone (NASACORT ALLERGY 24HR) 55 MCG/ACT AERO nasal inhaler Place 2 sprays into the nose as needed.      No current facility-administered medications on file prior to visit.    Allergies  Allergen Reactions  . Nitrous Oxide Other (See Comments)    Pt passed out and had confusion. Took a few hours to wake her up.  . Avelox [Moxifloxacin Hcl In Nacl] Other (See Comments)    Severe headache   . Cephalexin Other (See Comments)    Caused headache  . Other Other (See  Comments)    Anesthesia, needs antiemetic med  . Quinolones Other (See Comments)    Headaches?  . Tramadol Nausea Only and Other (See Comments)    Nausea and Dizziness, too.  . Trimethoprim Other (See Comments)    Rapid heartbeat.  . Citalopram Hydrobromide Other (See Comments)    Pt can't remember what the side effect was.    Objective:  General: Alert and oriented x3 in no acute distress  Dermatology: Keratotic lesion present sub-met 5 on right and bilateral hallux with skin lines transversing the lesion, pain is present with direct pressure to the lesion with a central nucleated core noted, no webspace macerations, no ecchymosis bilateral, all nails x 10 are well manicured.  Vascular: Dorsalis Pedis and Posterior Tibial pedal  pulses 1/4, Capillary Fill Time 3 seconds, + pedal hair growth bilateral, no edema bilateral lower extremities, Temperature gradient within normal limits.  Neurology: Johney Maine sensation intact via light touch bilateral.  Musculoskeletal: Mild tenderness with palpation at the keratotic lesion site on Right greater than left, fat pad atrophy bunion and hammertoe deformity pes planus foot type with pain to right sub-met 5 at area of nucleation as noted above.  Muscular strength 5/5 in all groups without pain or limitation on range of motion.   Assessment and Plan: Problem List Items Addressed This Visit    None    Visit Diagnoses    Porokeratosis    -  Primary   Benign skin lesion       Right foot pain         -Complete examination performed -Re-Discussed treatment options -At no additional charge/courtesy, Parred keratoic lesion using a chisel blade x1 on left and x2 on right; treated the area withSalinocaine covered with Band-Aid -Continue with offloading padding -Encouraged daily skin emollients; gave sample of foot miracle cream to use as instructed -Encouraged use of pumice stone for any additional buildup like before -Advised good supportive shoes and inserts like before -Patient to return to office in 3  months or as needed or sooner if condition worsens.  Landis Martins, DPM

## 2020-08-16 DIAGNOSIS — I10 Essential (primary) hypertension: Secondary | ICD-10-CM | POA: Diagnosis not present

## 2020-08-16 DIAGNOSIS — F419 Anxiety disorder, unspecified: Secondary | ICD-10-CM | POA: Diagnosis not present

## 2020-08-16 DIAGNOSIS — J310 Chronic rhinitis: Secondary | ICD-10-CM | POA: Diagnosis not present

## 2020-08-16 DIAGNOSIS — Z23 Encounter for immunization: Secondary | ICD-10-CM | POA: Diagnosis not present

## 2020-09-04 ENCOUNTER — Ambulatory Visit: Payer: Medicare Other | Attending: Internal Medicine

## 2020-09-04 DIAGNOSIS — Z23 Encounter for immunization: Secondary | ICD-10-CM

## 2020-09-04 NOTE — Progress Notes (Signed)
   Covid-19 Vaccination Clinic  Name:  TRINETTE VERA    MRN: [id number] DOB: [birth year]  09/04/2020  Ms. Escamilla was observed post Covid-19 immunization for 15 minutes without incident. She was provided with Vaccine Information Sheet and instruction to access the V-Safe system.   Ms. Walthall was instructed to call 911 with any severe reactions post vaccine: Marland Kitchen Difficulty breathing  . Swelling of face and throat  . A fast heartbeat  . A bad rash all over body  . Dizziness and weakness

## 2020-09-06 DIAGNOSIS — E785 Hyperlipidemia, unspecified: Secondary | ICD-10-CM | POA: Diagnosis not present

## 2020-09-06 DIAGNOSIS — I10 Essential (primary) hypertension: Secondary | ICD-10-CM | POA: Diagnosis not present

## 2020-09-08 DIAGNOSIS — J301 Allergic rhinitis due to pollen: Secondary | ICD-10-CM | POA: Diagnosis not present

## 2020-09-08 DIAGNOSIS — G44209 Tension-type headache, unspecified, not intractable: Secondary | ICD-10-CM | POA: Diagnosis not present

## 2020-09-08 DIAGNOSIS — E785 Hyperlipidemia, unspecified: Secondary | ICD-10-CM | POA: Diagnosis not present

## 2020-09-08 DIAGNOSIS — R0982 Postnasal drip: Secondary | ICD-10-CM | POA: Diagnosis not present

## 2020-10-18 DIAGNOSIS — K219 Gastro-esophageal reflux disease without esophagitis: Secondary | ICD-10-CM | POA: Diagnosis not present

## 2020-10-18 DIAGNOSIS — M150 Primary generalized (osteo)arthritis: Secondary | ICD-10-CM | POA: Diagnosis not present

## 2020-10-18 DIAGNOSIS — M1990 Unspecified osteoarthritis, unspecified site: Secondary | ICD-10-CM | POA: Diagnosis not present

## 2020-10-18 DIAGNOSIS — I10 Essential (primary) hypertension: Secondary | ICD-10-CM | POA: Diagnosis not present

## 2020-10-18 DIAGNOSIS — E785 Hyperlipidemia, unspecified: Secondary | ICD-10-CM | POA: Diagnosis not present

## 2020-10-18 DIAGNOSIS — C50911 Malignant neoplasm of unspecified site of right female breast: Secondary | ICD-10-CM | POA: Diagnosis not present

## 2020-11-02 ENCOUNTER — Other Ambulatory Visit: Payer: Self-pay

## 2020-11-02 ENCOUNTER — Encounter: Payer: Self-pay | Admitting: Sports Medicine

## 2020-11-02 ENCOUNTER — Ambulatory Visit (INDEPENDENT_AMBULATORY_CARE_PROVIDER_SITE_OTHER): Payer: Medicare Other | Admitting: Sports Medicine

## 2020-11-02 DIAGNOSIS — I6523 Occlusion and stenosis of bilateral carotid arteries: Secondary | ICD-10-CM

## 2020-11-02 DIAGNOSIS — M79671 Pain in right foot: Secondary | ICD-10-CM | POA: Diagnosis not present

## 2020-11-02 DIAGNOSIS — B351 Tinea unguium: Secondary | ICD-10-CM

## 2020-11-02 DIAGNOSIS — L989 Disorder of the skin and subcutaneous tissue, unspecified: Secondary | ICD-10-CM | POA: Diagnosis not present

## 2020-11-02 DIAGNOSIS — Q828 Other specified congenital malformations of skin: Secondary | ICD-10-CM

## 2020-11-02 NOTE — Progress Notes (Signed)
Subjective: Barbara Rangel is a 74 y.o. female patient who returns to office for  Follow up evaluation of right greater than left foot pain secondary to callus skin. Patient reports that callus needs trimming again and nails are a little long but otherwise is doing fine.  Patient denies any other pedal complaints.   Patient Active Problem List   Diagnosis Date Noted  . Carotid artery stenosis 01/05/2020  . Neck pain 03/30/2018  . Osteopenia determined by x-ray 08/28/2016  . History of right breast cancer 08/28/2016  . Malignant neoplasm of right female breast ([place]) 01/23/2016  . History of chemotherapy 01/23/2016  . Estrogen deficiency 07/29/2015  . 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/24/2015  . Dense breast tissue 01/24/2015  . Hx of adenomatous colonic polyps 01/24/2015  . Plantar fasciitis, bilateral 01/24/2015  . Breast cancer, right breast ([place]) 07/15/2013  . Syncope 09/15/2012  . Hypokalemia 09/15/2012  . Chronic back pain   . Chronic urinary tract infection   . Hypertension   . Arthritis   . Synovial cyst of lumbar facet joint 11/01/2011    Current Outpatient Medications on File Prior to Visit  Medication Sig Dispense Refill  . acetaminophen (TYLENOL) 500 MG tablet Take 1,000 mg by mouth at bedtime.    Marland Kitchen amLODipine (NORVASC) 5 MG tablet Take 5 mg by mouth at bedtime.     Marland Kitchen amoxicillin (AMOXIL) 500 MG tablet Take 500 mg by mouth 3 (three) times daily.    Marland Kitchen aspirin EC 81 MG tablet Take 81 mg by mouth every morning.     Marland Kitchen atorvastatin (LIPITOR) 20 MG tablet Take 1 tablet by mouth every morning.    Marland Kitchen azelastine (ASTELIN) 0.1 % nasal spray   0  . calcium citrate-vitamin D (CITRACAL+D) 315-200 MG-UNIT per tablet Take 2 tablets by mouth 2 (two) times daily.     . Cranberry Extract 250 MG TABS Take 1 capsule by mouth at bedtime.     . dorzolamide-timolol (COSOPT) 22.3-6.8 MG/ML ophthalmic solution 1 drop 2 (two) times daily.    . famotidine (PEPCID) 20 MG tablet TK 1 T PO QD  HS PRN    . latanoprost (XALATAN) 0.005 % ophthalmic solution Place 1 drop into both eyes at bedtime.      Marland Kitchen levocetirizine (XYZAL) 5 MG tablet Take 5 mg by mouth daily.    Marland Kitchen LORazepam (ATIVAN) 0.5 MG tablet TAKE 1/2 TO 1 TABLET BY MOUTH TWICE DAILY AS NEEDED ONLY WHEN NEEDED    . Multiple Vitamins-Minerals (MULTIVITAMINS THER. W/MINERALS) TABS Take 1 tablet by mouth every morning.     . nitrofurantoin, macrocrystal-monohydrate, (MACROBID) 100 MG capsule Take 100 mg by mouth 2 (two) times daily.    Marland Kitchen omeprazole (PRILOSEC) 40 MG capsule Take 40 mg by mouth every other day.     Marland Kitchen SHINGRIX injection     . terconazole (TERAZOL 3) 0.8 % vaginal cream INSERT 1 APPLICATORFUL VAGINALLY AT BEDTIME FOR 3 NIGHTS    . triamcinolone (NASACORT ALLERGY 24HR) 55 MCG/ACT AERO nasal inhaler Place 2 sprays into the nose as needed.      No current facility-administered medications on file prior to visit.    Allergies  Allergen Reactions  . Nitrous Oxide Other (See Comments)    Pt passed out and had confusion. Took a few hours to wake her up.  . Avelox [Moxifloxacin Hcl In Nacl] Other (See Comments)    Severe headache   . Cephalexin Other (See Comments)    Caused  headache  . Other Other (See Comments)    Anesthesia, needs antiemetic med  . Quinolones Other (See Comments)    Headaches?  . Tramadol Nausea Only and Other (See Comments)    Nausea and Dizziness, too.  . Trimethoprim Other (See Comments)    Rapid heartbeat.  . Citalopram Hydrobromide Other (See Comments)    Pt can't remember what the side effect was.    Objective:  General: Alert and oriented x3 in no acute distress  Dermatology: Keratotic lesion present sub-met 5 on right and bilateral hallux with skin lines transversing the lesion, pain is present with direct pressure to the lesion with a central nucleated core noted, no webspace macerations, no ecchymosis bilateral, all nails x 10 are mildly elongated and thickened.   Vascular:  Dorsalis Pedis and Posterior Tibial pedal pulses 1/4, Capillary Fill Time 3 seconds, + pedal hair growth bilateral, no edema bilateral lower extremities, Temperature gradient within normal limits.  Neurology: Johney Maine sensation intact via light touch bilateral.  Musculoskeletal: Mild tenderness with palpation at the keratotic lesion site on Right greater than left, fat pad atrophy bunion and hammertoe deformity pes planus foot type with pain to right sub-met 5 at area of nucleation as noted above.  Muscular strength 5/5 in all groups without pain or limitation on range of motion.   Assessment and Plan: Problem List Items Addressed This Visit   None   Visit Diagnoses    Porokeratosis    -  Primary   Benign skin lesion       Right foot pain       Onychomycosis         -Complete examination performed -Re-Discussed treatment options -At no additional charge/courtesy, trimmed nails x 10 using a sterile nail nipper and Parred keratoic lesion using a chisel blade x1 on left and x2 on right; treated the area withSalinocaine covered with Band-Aid -Continue with offloading padding -Encouraged daily skin emollients like previous  -Advised good supportive shoes and inserts like before -Patient to return to office in 3 months or as needed or sooner if condition worsens.  Landis Martins, DPM

## 2020-11-07 DIAGNOSIS — Z961 Presence of intraocular lens: Secondary | ICD-10-CM | POA: Diagnosis not present

## 2020-11-07 DIAGNOSIS — Z9229 Personal history of other drug therapy: Secondary | ICD-10-CM | POA: Insufficient documentation

## 2020-11-07 DIAGNOSIS — I779 Disorder of arteries and arterioles, unspecified: Secondary | ICD-10-CM | POA: Insufficient documentation

## 2020-11-07 DIAGNOSIS — H401131 Primary open-angle glaucoma, bilateral, mild stage: Secondary | ICD-10-CM | POA: Diagnosis not present

## 2020-11-07 DIAGNOSIS — N952 Postmenopausal atrophic vaginitis: Secondary | ICD-10-CM | POA: Insufficient documentation

## 2020-11-07 DIAGNOSIS — H2512 Age-related nuclear cataract, left eye: Secondary | ICD-10-CM | POA: Diagnosis not present

## 2020-11-22 ENCOUNTER — Other Ambulatory Visit: Payer: Self-pay | Admitting: *Deleted

## 2020-11-22 DIAGNOSIS — C50011 Malignant neoplasm of nipple and areola, right female breast: Secondary | ICD-10-CM

## 2020-11-22 DIAGNOSIS — Z1231 Encounter for screening mammogram for malignant neoplasm of breast: Secondary | ICD-10-CM

## 2020-12-20 DIAGNOSIS — K219 Gastro-esophageal reflux disease without esophagitis: Secondary | ICD-10-CM | POA: Diagnosis not present

## 2020-12-20 DIAGNOSIS — I10 Essential (primary) hypertension: Secondary | ICD-10-CM | POA: Diagnosis not present

## 2020-12-20 DIAGNOSIS — M150 Primary generalized (osteo)arthritis: Secondary | ICD-10-CM | POA: Diagnosis not present

## 2020-12-20 DIAGNOSIS — E785 Hyperlipidemia, unspecified: Secondary | ICD-10-CM | POA: Diagnosis not present

## 2020-12-20 DIAGNOSIS — M1990 Unspecified osteoarthritis, unspecified site: Secondary | ICD-10-CM | POA: Diagnosis not present

## 2021-01-05 ENCOUNTER — Ambulatory Visit: Payer: Medicare Other | Admitting: Physician Assistant

## 2021-01-05 ENCOUNTER — Other Ambulatory Visit: Payer: Self-pay

## 2021-01-05 ENCOUNTER — Ambulatory Visit (HOSPITAL_COMMUNITY)
Admission: RE | Admit: 2021-01-05 | Discharge: 2021-01-05 | Disposition: A | Discharge status: Home / Self Care | Payer: Medicare Other | Source: Ambulatory Visit | Attending: Vascular Surgery | Admitting: Vascular Surgery

## 2021-01-05 VITALS — BP 129/72 | HR 77 | Temp 98.3°F | Resp 20 | Ht 65.0 in | Wt 127.8 lb

## 2021-01-05 DIAGNOSIS — I6523 Occlusion and stenosis of bilateral carotid arteries: Secondary | ICD-10-CM | POA: Diagnosis not present

## 2021-01-05 NOTE — Progress Notes (Signed)
Office Note     CC:  follow up Requesting Provider:  Leeroy Cha,*  HPI: Barbara Rangel is a 75 y.o. ([birth year]) female who presents for routine follow up of carotid artery disease. She has no history of TIA or stroke. She has been followed since 2018 for moderate Bilateral ICA stenosis. Initially her Left was more significant that the right. She has been asymptomatic  Today she says she is doing well overall. She has been having recent headaches. She is not entirely sure when they started but she describes then as very localized and at times intense but they move from being at the back of her head to her right side of head, to top of head and occasionally some pain goes down right side of her neck. She noticed that this was occurring when she would go for her daily walks. If she stopped walking they would subside. They had gone away for a while and she had resumed her walking without issues but more recently over past 3 months they returned. She since has not been able to walk. She reports last headache was 2 weeks ago. She was tried on Gabapentin and Muscle relaxer by her PCP but neither really helped and the muscle relaxer made her too sick to continue. She denies any visual changes or aura, light sensitivity, dizziness, nausea, vomiting, shortness of breath or chest pain associated with her headaches. She says she has a remote history of cluster headaches 30 years ago and she does not remember if these headaches were similar. She otherwise denies any amaurosis fugax, slurred speech, facial dropping weakness or numbness of her upper or lower extremities. She does not report any claudication symptoms or rest pain. She is compliant with her Statin and Aspirin.  The pt is on a statin for cholesterol management.  The pt is on a daily aspirin.   Other AC: none The pt is on CCB for hypertension.   The pt is not diabetic.  Tobacco hx: never smoker  Past Medical History:  Diagnosis Date   . Arthritis    degenerative arthritis  . Breast cancer, right breast ([place]) 04/2004   T2N1 diagnosed June 2005, ER PR + and Her 2 negative, post mastectomy with axillary node dissection (possibly 9 or 11 nodes removed, with possibly 7 or 9 involved) then treatment on NSABP B28 with dose dense adriamycin cytoxan followed by taxol, all of this Rx  in [place]. Adjuvant arimidex begun Jan 2006. No radiation.   . Chronic back pain    right radicular leg pain  . Chronic urinary tract infection    takes Macrodantin and Cranberry daily  . Constipation    r/t pain meds and takes Dulcolax nightly  . Glaucoma    uses Xalantan nightly  . Hypertension    takes Amlodipine daily  . Hypokalemia 09/15/2012  . 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/24/2015  . Other and unspecified general anesthetics causing adverse effect in therapeutic use    hard to wake up  . PONV (postoperative nausea and vomiting)   . Syncope 09/15/2012    Past Surgical History:  Procedure Laterality Date  . ANKLE SURGERY  2011   left ankle with screws and plates  . BREAST LUMPECTOMY     x2  . cataract surgery  2005   right eye  . COLONOSCOPY    . COLONOSCOPY WITH PROPOFOL N/A 10/11/2014   Procedure: COLONOSCOPY WITH PROPOFOL;  Surgeon: Garlan Fair, MD;  Location: [facility];  Service: Endoscopy;  Laterality: N/A;  . EYE SURGERY  2004   right eye-detached retina  . left breast biopsy  2009  . LUMBAR LAMINECTOMY/DECOMPRESSION MICRODISCECTOMY  10/31/2011   Procedure: LUMBAR LAMINECTOMY/DECOMPRESSION MICRODISCECTOMY;  Surgeon: Dahlia Bailiff;  Location: [facility];  Service: Orthopedics;  Laterality: Right;  L4-5 Decompression with Right Facet Decompression   . MASTECTOMY  2005   right  d/t breast cancer;no sticks to right arm LYMPH NODES REMOVED.  Marland Kitchen RETINAL DETACHMENT SURGERY  2004  . right breast biopsy  2005  . TUBAL LIGATION  1980    Social History   Socioeconomic History  . Marital status: Married    Spouse  name: Doren Custard  . Number of children: 4  . Years of education: BS  . Highest education level: Not on file  Occupational History  . Occupation: Retired  Tobacco Use  . Smoking status: Never Smoker  . Smokeless tobacco: Never Used  Vaping Use  . Vaping Use: Never used  Substance and Sexual Activity  . Alcohol use: No  . Drug use: No  . Sexual activity: Yes  Other Topics Concern  . Not on file  Social History Narrative   Pt lives at home with spouse.   Caffeine Use: none   Social Determinants of Radio broadcast assistant Strain: Not on file  Food Insecurity: Not on file  Transportation Needs: Not on file  Physical Activity: Not on file  Stress: Not on file  Social Connections: Not on file  Intimate Partner Violence: Not on file    Family History  Problem Relation Age of Onset  . Colon cancer Mother   . Breast cancer Cousin        on dads side  . Breast cancer Cousin        on moms side  . Anesthesia problems Neg Hx   . Hypotension Neg Hx   . Malignant hyperthermia Neg Hx   . Pseudochol deficiency Neg Hx     Current Outpatient Medications  Medication Sig Dispense Refill  . acetaminophen (TYLENOL) 500 MG tablet Take 1,000 mg by mouth at bedtime.    Marland Kitchen amLODipine (NORVASC) 5 MG tablet Take 5 mg by mouth at bedtime.     Marland Kitchen aspirin EC 81 MG tablet Take 81 mg by mouth every morning.     Marland Kitchen atorvastatin (LIPITOR) 20 MG tablet Take 1 tablet by mouth every morning.    Marland Kitchen azelastine (ASTELIN) 0.1 % nasal spray   0  . calcium citrate-vitamin D (CITRACAL+D) 315-200 MG-UNIT per tablet Take 2 tablets by mouth 2 (two) times daily.    . Cranberry Extract 250 MG TABS Take 1 capsule by mouth at bedtime.     . dorzolamide-timolol (COSOPT) 22.3-6.8 MG/ML ophthalmic solution 1 drop 2 (two) times daily.    . famotidine (PEPCID) 20 MG tablet TK 1 T PO QD HS PRN    . latanoprost (XALATAN) 0.005 % ophthalmic solution Place 1 drop into both eyes at bedtime.    Marland Kitchen levocetirizine (XYZAL) 5 MG  tablet Take 5 mg by mouth daily.    Marland Kitchen LORazepam (ATIVAN) 0.5 MG tablet TAKE 1/2 TO 1 TABLET BY MOUTH TWICE DAILY AS NEEDED ONLY WHEN NEEDED    . Multiple Vitamins-Minerals (MULTIVITAMINS THER. W/MINERALS) TABS Take 1 tablet by mouth every morning.    Marland Kitchen SHINGRIX injection      No current facility-administered medications for this visit.    Allergies  Allergen Reactions  . Nitrous Oxide Other (  See Comments)    Pt passed out and had confusion. Took a few hours to wake her up.  . Avelox [Moxifloxacin Hcl In Nacl] Other (See Comments)    Severe headache   . Cephalexin Other (See Comments)    Caused headache  . Other Other (See Comments)    Anesthesia, needs antiemetic med  . Quinolones Other (See Comments)    Headaches?  . Tramadol Nausea Only and Other (See Comments)    Nausea and Dizziness, too.  . Trimethoprim Other (See Comments)    Rapid heartbeat.  . Tizanidine Hcl Other (See Comments)  . Citalopram Hydrobromide Other (See Comments)    Pt can't remember what the side effect was.     REVIEW OF SYSTEMS:  [X]  denotes positive finding, [ ]  denotes negative finding Cardiac  Comments:  Chest pain or chest pressure:    Shortness of breath upon exertion:    Short of breath when lying flat:    Irregular heart rhythm:        Vascular    Pain in calf, thigh, or hip brought on by ambulation:    Pain in feet at night that wakes you up from your sleep:     Blood clot in your veins:    Leg swelling:  X Mild bilateral swelling, wears compression stockings from time to time      Pulmonary    Oxygen at home:    Productive cough:     Wheezing:         Neurologic    Sudden weakness in arms or legs:     Sudden numbness in arms or legs:     Sudden onset of difficulty speaking or slurred speech:    Temporary loss of vision in one eye:     Problems with dizziness:         Gastrointestinal    Blood in stool:     Vomited blood:         Genitourinary    Burning when urinating:      Blood in urine:        Psychiatric    Major depression:         Hematologic    Bleeding problems:    Problems with blood clotting too easily:        Skin    Rashes or ulcers:        Constitutional    Fever or chills:      PHYSICAL EXAMINATION:  Vitals:   01/05/21 1142  BP: 129/72  Pulse: 77  Resp: 20  Temp: 98.3 F (36.8 C)  TempSrc: Temporal  SpO2: 100%  Weight: 127 lb 12.8 oz (58 kg)  Height: 5\' 5"  (1.651 m)    General:  WDWN in NAD; vital signs documented above Gait: Normal HENT: WNL, normocephalic Pulmonary: normal non-labored breathing , without wheezing Cardiac: regular HR, without  Murmurs without carotid bruit Vascular Exam/Pulses: 2 + radial pulses, 2+ DP and PT pulses bilaterally. Chronic venous changes to bilateral lower extremities with hyperpigmentation , reticular veins and very minimal edema at ankles Musculoskeletal: no muscle wasting or atrophy  Neurologic: A&O X 3;  No focal weakness or paresthesias are detected Psychiatric:  The pt has Normal affect.   Non-Invasive Vascular Imaging:   Right Carotid: Velocities in the right ICA are consistent with a 1-39% stenosis.   Left Carotid: Velocities in the left ICA are consistent with a 40-59% stenosis.   Vertebrals: Bilateral vertebral arteries demonstrate antegrade flow.  Subclavians: Normal flow hemodynamics were seen in bilateral subclavian arteries.    ASSESSMENT/PLAN:: 75 y.o. female here for follow up for carotid artery disease. She is without any new symptoms. Her duplex today is essentially unchanged. Her right ICA shows 1-39% and left ICA shows 40-59%. - Recommend she follow up with PCP for further management of headaches as I do not feel that it is related to her carotid disease - She will continue her Aspirin and Statin - She will follow up in 1 year with repeat carotid duplex   Karoline Caldwell, PA-C [facility] [phone]  Clinic MD:  Dr. Donzetta Matters

## 2021-01-19 DIAGNOSIS — K219 Gastro-esophageal reflux disease without esophagitis: Secondary | ICD-10-CM | POA: Diagnosis not present

## 2021-01-19 DIAGNOSIS — I10 Essential (primary) hypertension: Secondary | ICD-10-CM | POA: Diagnosis not present

## 2021-01-19 DIAGNOSIS — M1990 Unspecified osteoarthritis, unspecified site: Secondary | ICD-10-CM | POA: Diagnosis not present

## 2021-01-19 DIAGNOSIS — M150 Primary generalized (osteo)arthritis: Secondary | ICD-10-CM | POA: Diagnosis not present

## 2021-01-19 DIAGNOSIS — E785 Hyperlipidemia, unspecified: Secondary | ICD-10-CM | POA: Diagnosis not present

## 2021-01-19 DIAGNOSIS — C50911 Malignant neoplasm of unspecified site of right female breast: Secondary | ICD-10-CM | POA: Diagnosis not present

## 2021-01-24 DIAGNOSIS — L03019 Cellulitis of unspecified finger: Secondary | ICD-10-CM | POA: Diagnosis not present

## 2021-01-26 ENCOUNTER — Other Ambulatory Visit: Payer: Self-pay

## 2021-01-26 ENCOUNTER — Ambulatory Visit
Admission: RE | Admit: 2021-01-26 | Discharge: 2021-01-26 | Disposition: A | Discharge status: Home / Self Care | Payer: Medicare Other | Source: Ambulatory Visit | Attending: Hematology and Oncology | Admitting: Hematology and Oncology

## 2021-01-26 DIAGNOSIS — Z1231 Encounter for screening mammogram for malignant neoplasm of breast: Secondary | ICD-10-CM | POA: Diagnosis not present

## 2021-01-26 DIAGNOSIS — C50011 Malignant neoplasm of nipple and areola, right female breast: Secondary | ICD-10-CM

## 2021-01-31 ENCOUNTER — Other Ambulatory Visit: Payer: Self-pay | Admitting: *Deleted

## 2021-01-31 MED ORDER — LATANOPROST 0.005 % OP SOLN: 1.0000 [drp] | Freq: Every day | OPHTHALMIC | 1 refills | Status: DC

## 2021-02-01 ENCOUNTER — Ambulatory Visit (INDEPENDENT_AMBULATORY_CARE_PROVIDER_SITE_OTHER): Payer: Medicare Other | Admitting: Sports Medicine

## 2021-02-01 ENCOUNTER — Other Ambulatory Visit: Payer: Self-pay

## 2021-02-01 ENCOUNTER — Encounter: Payer: Self-pay | Admitting: Sports Medicine

## 2021-02-01 DIAGNOSIS — M79675 Pain in left toe(s): Secondary | ICD-10-CM

## 2021-02-01 DIAGNOSIS — Q828 Other specified congenital malformations of skin: Secondary | ICD-10-CM | POA: Diagnosis not present

## 2021-02-01 DIAGNOSIS — M722 Plantar fascial fibromatosis: Secondary | ICD-10-CM

## 2021-02-01 DIAGNOSIS — L989 Disorder of the skin and subcutaneous tissue, unspecified: Secondary | ICD-10-CM | POA: Diagnosis not present

## 2021-02-01 MED ORDER — NEOMYCIN-POLYMYXIN-HC 3.5-10000-1 OT SOLN: OTIC | 0 refills | Status: DC

## 2021-02-01 NOTE — Patient Instructions (Signed)
Plantar fascial cushion/arch support left foot

## 2021-02-01 NOTE — Progress Notes (Signed)
Subjective: Barbara Rangel is a 75 y.o. female patient who returns to office for follow up evaluation of right greater than left foot pain secondary to callus skin. Patient reports that callus needs trimming again and reports a new problem with her left great toenail reports that she had an incident of pain and swelling around the nail and nail fold saw her PCP who put her on doxycycline which seemed to help.  Patient also reports that she has been having a little bit of pulling sensation in her arch and was doing some stretches for her plantar fascia and she thinks that either her shoes for the way she restriction could have caused her nail issue.  Patient Active Problem List   Diagnosis Date Noted  . Carotid artery stenosis 01/05/2020  . Neck pain 03/30/2018  . Osteopenia determined by x-ray 08/28/2016  . History of right breast cancer 08/28/2016  . Malignant neoplasm of right female breast ([place]) 01/23/2016  . History of chemotherapy 01/23/2016  . Estrogen deficiency 07/29/2015  . 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/24/2015  . Dense breast tissue 01/24/2015  . Hx of adenomatous colonic polyps 01/24/2015  . Plantar fasciitis, bilateral 01/24/2015  . Breast cancer, right breast ([place]) 07/15/2013  . Syncope 09/15/2012  . Hypokalemia 09/15/2012  . Chronic back pain   . Chronic urinary tract infection   . Hypertension   . Arthritis   . Synovial cyst of lumbar facet joint 11/01/2011    Current Outpatient Medications on File Prior to Visit  Medication Sig Dispense Refill  . acetaminophen (TYLENOL) 500 MG tablet Take 1,000 mg by mouth at bedtime.    Marland Kitchen amLODipine (NORVASC) 5 MG tablet Take 5 mg by mouth at bedtime.     Marland Kitchen aspirin EC 81 MG tablet Take 81 mg by mouth every morning.     Marland Kitchen atorvastatin (LIPITOR) 20 MG tablet Take 1 tablet by mouth every morning.    Marland Kitchen azelastine (ASTELIN) 0.1 % nasal spray   0  . calcium citrate-vitamin D (CITRACAL+D) 315-200 MG-UNIT per tablet Take 2 tablets  by mouth 2 (two) times daily.    . Cranberry Extract 250 MG TABS Take 1 capsule by mouth at bedtime.     . dorzolamide-timolol (COSOPT) 22.3-6.8 MG/ML ophthalmic solution 1 drop 2 (two) times daily.    . famotidine (PEPCID) 20 MG tablet TK 1 T PO QD HS PRN    . latanoprost (XALATAN) 0.005 % ophthalmic solution Place 1 drop into both eyes at bedtime. 2.5 mL 1  . levocetirizine (XYZAL) 5 MG tablet Take 5 mg by mouth daily.    Marland Kitchen LORazepam (ATIVAN) 0.5 MG tablet TAKE 1/2 TO 1 TABLET BY MOUTH TWICE DAILY AS NEEDED ONLY WHEN NEEDED    . Multiple Vitamins-Minerals (MULTIVITAMINS THER. W/MINERALS) TABS Take 1 tablet by mouth every morning.    Marland Kitchen SHINGRIX injection      No current facility-administered medications on file prior to visit.    Allergies  Allergen Reactions  . Nitrous Oxide Other (See Comments)    Pt passed out and had confusion. Took a few hours to wake her up.  . Avelox [Moxifloxacin Hcl In Nacl] Other (See Comments)    Severe headache   . Cephalexin Other (See Comments)    Caused headache  . Other Other (See Comments)    Anesthesia, needs antiemetic med  . Quinolones Other (See Comments)    Headaches?  . Tramadol Nausea Only and Other (See Comments)    Nausea  and Dizziness, too.  . Trimethoprim Other (See Comments)    Rapid heartbeat.  . Tizanidine Hcl Other (See Comments)  . Citalopram Hydrobromide Other (See Comments)    Pt can't remember what the side effect was.    Objective:  General: Alert and oriented x3 in no acute distress  Dermatology: Keratotic lesion present sub-met 5 on right and bilateral hallux with skin lines transversing the lesion, pain is present with direct pressure to the lesion with a central nucleated core noted, no webspace macerations, no ecchymosis bilateral, all nails x 10 are short and thickened consistent with onychomycosis however there is no lifting or life is noted at the left hallux toenail but there is a small bruise noted to the proximal  nail fold.  No significant redness warmth swelling or drainage to the area.  Vascular: Dorsalis Pedis and Posterior Tibial pedal pulses 1/4, Capillary Fill Time 3 seconds, + pedal hair growth bilateral, no edema bilateral lower extremities, Temperature gradient within normal limits.  Neurology: Johney Maine sensation intact via light touch bilateral.  Musculoskeletal: Mild tenderness with palpation at the keratotic lesion site on Right greater than left, fat pad atrophy bunion and hammertoe deformity pes planus foot type with pain to right sub-met 5 at area of nucleation as noted above.  History of plantar fasciitis with a subjective pulling sensation noted to the left arch.  Muscular strength 5/5 in all groups without pain or limitation on range of motion.   Assessment and Plan: Problem List Items Addressed This Visit   None   Visit Diagnoses    Porokeratosis    -  Primary   Benign skin lesion       Pain around toenail, left foot       Plantar fasciitis, left         -Complete examination performed -Re-Discussed treatment options for calluses -At no additional charge, parred keratoic lesion using a chisel blade x1 on left and x2 on right; treated the area withSalinocaine covered with Band-Aid -Continue with offloading padding -Encouraged daily skin emollients like previous  -Advised good supportive shoes and inserts like before -Recommend daily stretching to prevent flareup of plantar fasciitis and use of arch support cushion which she could find at [employer] -Prescribed Corticosporin solution for patient to use around left first toe nail and advised patient to monitor if worsens may start soaking with Epson salt and return to office sooner -Patient to return to office in 3 months or as needed or sooner if condition worsens.  Landis Martins, DPM

## 2021-02-09 DIAGNOSIS — K219 Gastro-esophageal reflux disease without esophagitis: Secondary | ICD-10-CM | POA: Diagnosis not present

## 2021-02-09 DIAGNOSIS — Z800 Family history of malignant neoplasm of digestive organs: Secondary | ICD-10-CM | POA: Diagnosis not present

## 2021-02-21 DIAGNOSIS — I10 Essential (primary) hypertension: Secondary | ICD-10-CM | POA: Diagnosis not present

## 2021-02-21 DIAGNOSIS — M150 Primary generalized (osteo)arthritis: Secondary | ICD-10-CM | POA: Diagnosis not present

## 2021-02-21 DIAGNOSIS — K219 Gastro-esophageal reflux disease without esophagitis: Secondary | ICD-10-CM | POA: Diagnosis not present

## 2021-02-21 DIAGNOSIS — B351 Tinea unguium: Secondary | ICD-10-CM | POA: Diagnosis not present

## 2021-02-21 DIAGNOSIS — Z0000 Encounter for general adult medical examination without abnormal findings: Secondary | ICD-10-CM | POA: Diagnosis not present

## 2021-02-21 DIAGNOSIS — E785 Hyperlipidemia, unspecified: Secondary | ICD-10-CM | POA: Diagnosis not present

## 2021-02-21 DIAGNOSIS — C50911 Malignant neoplasm of unspecified site of right female breast: Secondary | ICD-10-CM | POA: Diagnosis not present

## 2021-02-21 DIAGNOSIS — Z800 Family history of malignant neoplasm of digestive organs: Secondary | ICD-10-CM | POA: Diagnosis not present

## 2021-02-21 DIAGNOSIS — M21619 Bunion of unspecified foot: Secondary | ICD-10-CM | POA: Diagnosis not present

## 2021-02-28 ENCOUNTER — Ambulatory Visit: Payer: Medicare Other | Admitting: Hematology and Oncology

## 2021-03-06 NOTE — Assessment & Plan Note (Signed)
Stage IIIa right breast cancer T2 N2 M0 diagnosed in June 2005 in [place] treated with mastectomy followed by adjuvant chemotherapy. 7 on 9 lymph nodes were apparently involved., Did not get adjuvant radiation(Unknown reasons). Arimidex 1 mg daily started January 2006 stopped 02/26/2019  Osteopenia: Patient is on weightbearing exercise program.  Bone density 02/17/2020: T score -1.5: Stable  Breast cancer surveillance: 1.  Breast exam 02/29/2020: Benign, right mastectomy 2.  Mammogram 01/29/2021: Left breast: No evidence of malignancy breast density category C  Return to clinic in 1 year for follow-up

## 2021-03-06 NOTE — Progress Notes (Signed)
Patient Care Team: Lorenda Ishihara, MD as PCP - General (Internal Medicine) Serena Croissant, MD as Consulting Physician (Hematology and Oncology)  DIAGNOSIS:    ICD-10-CM   1. 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, unspecified estrogen receptor status (HCC)  C50.011     SUMMARY OF ONCOLOGIC HISTORY: Oncology History  Breast cancer, right breast (HCC)  01/10/2004 Initial Diagnosis   Patient found right breast cancer on breast self exam after negative mammogram same year, diagnosis March 2005 in [place]   04/26/2004 Surgery   Right mastectomy: T2 N2a M0 ER/PR positive HER-2 negative stage IIIa; 9 lymph nodes were involved (did not get radiation)   05/18/2004 - 09/21/2004 Chemotherapy   NSABP B 28 clinical trial with dose dense Adriamycin and Cytoxan followed by Taxol    11/12/2004 - 02/26/2019 Anti-estrogen oral therapy   Arimidex 1 mg daily     CHIEF COMPLIANT: Follow-up of right breast cancer  INTERVAL HISTORY: Barbara Rangel is a 75 y.o. with above-mentioned history of right breast cancer treated with mastectomy, adjuvant chemotherapy, and completed anastrozole.Mammogram on 01/26/21 showed no evidence of malignancy in the left breast. She presents to the clinic today for annual follow-up.    ALLERGIES:  is allergic to nitrous oxide, avelox [moxifloxacin hcl in nacl], cephalexin, other, quinolones, tramadol, trimethoprim, tizanidine hcl, and citalopram hydrobromide.  MEDICATIONS:  Current Outpatient Medications  Medication Sig Dispense Refill  . carbamide peroxide (DEBROX) 6.5 % OTIC solution Place 5 drops into both ears 2 (two) times daily. 15 mL 0  . guaiFENesin (MUCINEX) 600 MG 12 hr tablet Take 1 tablet (600 mg total) by mouth 2 (two) times daily as needed.    Marland Kitchen acetaminophen (TYLENOL) 500 MG tablet Take 1,000 mg by mouth at bedtime.    Marland Kitchen amLODipine (NORVASC) 5 MG tablet Take 5 mg by mouth at bedtime.     Marland Kitchen aspirin EC 81 MG  tablet Take 81 mg by mouth every morning.     Marland Kitchen atorvastatin (LIPITOR) 20 MG tablet Take 1 tablet by mouth every morning.    Marland Kitchen azelastine (ASTELIN) 0.1 % nasal spray   0  . calcium citrate-vitamin D (CITRACAL+D) 315-200 MG-UNIT per tablet Take 2 tablets by mouth 2 (two) times daily.    . Cranberry Extract 250 MG TABS Take 1 capsule by mouth at bedtime.     . famotidine (PEPCID) 20 MG tablet TK 1 T PO QD HS PRN    . latanoprost (XALATAN) 0.005 % ophthalmic solution Place 1 drop into both eyes at bedtime. 2.5 mL 1  . levocetirizine (XYZAL) 5 MG tablet Take 5 mg by mouth daily.    Marland Kitchen LORazepam (ATIVAN) 0.5 MG tablet TAKE 1/2 TO 1 TABLET BY MOUTH TWICE DAILY AS NEEDED ONLY WHEN NEEDED    . Multiple Vitamins-Minerals (MULTIVITAMINS THER. W/MINERALS) TABS Take 1 tablet by mouth every morning.    Marland Kitchen SHINGRIX injection      No current facility-administered medications for this visit.    PHYSICAL EXAMINATION: ECOG PERFORMANCE STATUS: 1 - Symptomatic but completely ambulatory  Vitals:   03/07/21 1131  BP: 136/72  Pulse: 80  Resp: 17  Temp: 97.6 F (36.4 C)  SpO2: 100%   Filed Weights   03/07/21 1131  Weight: 123 lb 12.8 oz (56.2 kg)    BREAST: No palpable masses or nodules in either right or left breasts. No palpable axillary supraclavicular or infraclavicular adenopathy no breast tenderness or nipple discharge. (exam performed in  the presence of a chaperone)  LABORATORY DATA:  I have reviewed the data as listed CMP Latest Ref Rng & Units 08/31/2019 08/16/2019 02/24/2017  Glucose 70 - 99 mg/dL 112(H) 86 82  BUN 8 - 23 mg/dL 11 12 14.4  Creatinine 0.44 - 1.00 mg/dL 0.76 0.78 0.8  Sodium 135 - 145 mmol/L 138 142 143  Potassium 3.5 - 5.1 mmol/L 3.7 4.2 4.0  Chloride 98 - 111 mmol/L 102 103 -  CO2 22 - 32 mmol/L $RemoveB'24 26 28  'DAYLrsPu$ Calcium 8.9 - 10.3 mg/dL 9.9 10.5(H) 10.4  Total Protein 6.0 - 8.5 g/dL - 7.2 7.0  Total Bilirubin 0.0 - 1.2 mg/dL - 0.5 0.60  Alkaline Phos 39 - 117 IU/L - 79 60  AST  0 - 40 IU/L - 34 27  ALT 0 - 32 IU/L - 28 26    Lab Results  Component Value Date   WBC 5.8 08/31/2019   HGB 15.4 (H) 08/31/2019   HCT 46.3 (H) 08/31/2019   MCV 98.3 08/31/2019   PLT 220 08/31/2019   NEUTROABS 3.0 02/24/2017    ASSESSMENT & PLAN:  Breast cancer, right breast Stage IIIa right breast cancer T2 N2 M0 diagnosed in June 2005 in [place] treated with mastectomy followed by adjuvant chemotherapy. 7 on 9 lymph nodes were apparently involved., Did not get adjuvant radiation(Unknown reasons). Arimidex 1 mg daily started January 2006 stopped 02/26/2019  Osteopenia: Patient is on weightbearing exercise program.  Bone density 02/17/2020: T score -1.5: Stable We will obtain a bone density test next year along with her mammogram.  Breast cancer surveillance: 1.  Breast exam 02/29/2020: Benign, right mastectomy 2.  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 Return to clinic in 1 year for follow-up    No orders of the defined types were placed in this encounter.  The patient has a good understanding of the overall plan. she agrees with it. she will call with any problems that may develop before the next visit here.  Total time spent: 20 mins including face to face time and time spent for planning, charting and coordination of care  Rulon Eisenmenger, MD, MPH 03/07/2021  I, Molly Dorshimer, am acting as scribe for Dr. Nicholas Lose.  I have reviewed the above documentation for accuracy and completeness, and I agree with the above.

## 2021-03-07 ENCOUNTER — Other Ambulatory Visit: Payer: Self-pay

## 2021-03-07 ENCOUNTER — Inpatient Hospital Stay: Payer: Medicare Other | Attending: Hematology and Oncology | Admitting: Hematology and Oncology

## 2021-03-07 VITALS — BP 136/72 | HR 80 | Temp 97.6°F | Resp 17 | Ht 65.0 in | Wt 123.8 lb

## 2021-03-07 DIAGNOSIS — Z853 Personal history of malignant neoplasm of breast: Secondary | ICD-10-CM | POA: Diagnosis not present

## 2021-03-07 DIAGNOSIS — C50011 Malignant neoplasm of nipple and areola, right female breast: Secondary | ICD-10-CM

## 2021-03-07 DIAGNOSIS — M8580 Other specified disorders of bone density and structure, unspecified site: Secondary | ICD-10-CM | POA: Insufficient documentation

## 2021-03-07 DIAGNOSIS — Z9221 Personal history of antineoplastic chemotherapy: Secondary | ICD-10-CM | POA: Insufficient documentation

## 2021-03-07 DIAGNOSIS — Z9011 Acquired absence of right breast and nipple: Secondary | ICD-10-CM | POA: Diagnosis not present

## 2021-03-07 DIAGNOSIS — Z923 Personal history of irradiation: Secondary | ICD-10-CM | POA: Insufficient documentation

## 2021-03-07 DIAGNOSIS — Z780 Asymptomatic menopausal state: Secondary | ICD-10-CM | POA: Diagnosis not present

## 2021-03-07 MED ORDER — DEBROX 6.5 % OT SOLN: 5.0000 [drp] | Freq: Two times a day (BID) | OTIC | 0 refills | Status: AC

## 2021-03-07 MED ORDER — GUAIFENESIN ER 600 MG PO TB12: 600.0000 mg | ORAL_TABLET | Freq: Two times a day (BID) | ORAL | Status: AC | PRN

## 2021-03-08 DIAGNOSIS — H401131 Primary open-angle glaucoma, bilateral, mild stage: Secondary | ICD-10-CM | POA: Diagnosis not present

## 2021-03-21 DIAGNOSIS — C50911 Malignant neoplasm of unspecified site of right female breast: Secondary | ICD-10-CM | POA: Diagnosis not present

## 2021-03-21 DIAGNOSIS — K219 Gastro-esophageal reflux disease without esophagitis: Secondary | ICD-10-CM | POA: Diagnosis not present

## 2021-03-21 DIAGNOSIS — E785 Hyperlipidemia, unspecified: Secondary | ICD-10-CM | POA: Diagnosis not present

## 2021-03-21 DIAGNOSIS — M1990 Unspecified osteoarthritis, unspecified site: Secondary | ICD-10-CM | POA: Diagnosis not present

## 2021-03-21 DIAGNOSIS — M150 Primary generalized (osteo)arthritis: Secondary | ICD-10-CM | POA: Diagnosis not present

## 2021-03-21 DIAGNOSIS — I10 Essential (primary) hypertension: Secondary | ICD-10-CM | POA: Diagnosis not present

## 2021-04-12 DIAGNOSIS — R350 Frequency of micturition: Secondary | ICD-10-CM | POA: Diagnosis not present

## 2021-04-17 DIAGNOSIS — N3281 Overactive bladder: Secondary | ICD-10-CM | POA: Diagnosis not present

## 2021-05-03 ENCOUNTER — Ambulatory Visit: Payer: Medicare Other | Admitting: Sports Medicine

## 2021-05-18 ENCOUNTER — Telehealth: Payer: Self-pay

## 2021-05-18 NOTE — Telephone Encounter (Signed)
Attempt made to contact Barbara Rangel is a 75 y.o. female re: New Patient appointment with Dr. Wannetta Sender. Pt was not available.  LM on the VM for the patient to call me back.

## 2021-05-21 DIAGNOSIS — R350 Frequency of micturition: Secondary | ICD-10-CM | POA: Diagnosis not present

## 2021-05-22 DIAGNOSIS — I10 Essential (primary) hypertension: Secondary | ICD-10-CM | POA: Diagnosis not present

## 2021-05-22 DIAGNOSIS — M1990 Unspecified osteoarthritis, unspecified site: Secondary | ICD-10-CM | POA: Diagnosis not present

## 2021-05-22 DIAGNOSIS — M150 Primary generalized (osteo)arthritis: Secondary | ICD-10-CM | POA: Diagnosis not present

## 2021-05-22 DIAGNOSIS — K219 Gastro-esophageal reflux disease without esophagitis: Secondary | ICD-10-CM | POA: Diagnosis not present

## 2021-05-22 DIAGNOSIS — E785 Hyperlipidemia, unspecified: Secondary | ICD-10-CM | POA: Diagnosis not present

## 2021-05-23 NOTE — Telephone Encounter (Signed)
2ND ATTEMPT  Attempt made to contact Barbara Rangel is a 75 y.o. female re: schedule a new patient appt with Dr. Wannetta Sender below.  Pt was not available.  LM on the VM for the patient to call me back.

## 2021-05-31 ENCOUNTER — Ambulatory Visit (INDEPENDENT_AMBULATORY_CARE_PROVIDER_SITE_OTHER): Payer: Medicare Other | Admitting: Sports Medicine

## 2021-05-31 ENCOUNTER — Other Ambulatory Visit: Payer: Self-pay

## 2021-05-31 ENCOUNTER — Encounter: Payer: Self-pay | Admitting: Sports Medicine

## 2021-05-31 DIAGNOSIS — L989 Disorder of the skin and subcutaneous tissue, unspecified: Secondary | ICD-10-CM

## 2021-05-31 DIAGNOSIS — B351 Tinea unguium: Secondary | ICD-10-CM

## 2021-05-31 DIAGNOSIS — M79609 Pain in unspecified limb: Secondary | ICD-10-CM

## 2021-05-31 DIAGNOSIS — M79671 Pain in right foot: Secondary | ICD-10-CM

## 2021-05-31 DIAGNOSIS — M79672 Pain in left foot: Secondary | ICD-10-CM

## 2021-05-31 DIAGNOSIS — Q828 Other specified congenital malformations of skin: Secondary | ICD-10-CM

## 2021-05-31 NOTE — Progress Notes (Signed)
Subjective: Barbara Rangel is a 75 y.o. female patient who returns to office for follow up evaluation of right greater than left foot pain secondary to callus skin and for nail trim. Patient reports that she missed last visit and had to reschedule due to her close contact with someone who contracted COVID.  Denies any current symptoms at this time.  Patient Active Problem List   Diagnosis Date Noted   Carotid artery stenosis 01/05/2020   Neck pain 03/30/2018   Osteopenia determined by x-ray 08/28/2016   History of right breast cancer 08/28/2016   Malignant neoplasm of right female breast (HCC) 01/23/2016   History of chemotherapy 01/23/2016   Estrogen deficiency 07/29/2015   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/24/2015   Dense breast tissue 01/24/2015   Hx of adenomatous colonic polyps 01/24/2015   Plantar fasciitis, bilateral 01/24/2015   Breast cancer, right breast (HCC) 07/15/2013   Syncope 09/15/2012   Hypokalemia 09/15/2012   Chronic back pain    Chronic urinary tract infection    Hypertension    Arthritis    Synovial cyst of lumbar facet joint 11/01/2011    Current Outpatient Medications on File Prior to Visit  Medication Sig Dispense Refill   acetaminophen (TYLENOL) 500 MG tablet Take 1,000 mg by mouth at bedtime.     amLODipine (NORVASC) 5 MG tablet Take 5 mg by mouth at bedtime.      aspirin EC 81 MG tablet Take 81 mg by mouth every morning.      atorvastatin (LIPITOR) 20 MG tablet Take 1 tablet by mouth every morning.     azelastine (ASTELIN) 0.1 % nasal spray   0   calcium citrate-vitamin D (CITRACAL+D) 315-200 MG-UNIT per tablet Take 2 tablets by mouth 2 (two) times daily.     carbamide peroxide (DEBROX) 6.5 % OTIC solution Place 5 drops into both ears 2 (two) times daily. 15 mL 0   Cranberry Extract 250 MG TABS Take 1 capsule by mouth at bedtime.      dorzolamide-timolol (COSOPT) 22.3-6.8 MG/ML ophthalmic solution SMARTSIG:In Eye(s)     doxycycline (VIBRA-TABS)  100 MG tablet Take 100 mg by mouth 2 (two) times daily.     escitalopram (LEXAPRO) 5 MG tablet Take 5 mg by mouth daily.     famotidine (PEPCID) 20 MG tablet TK 1 T PO QD HS PRN     guaiFENesin (MUCINEX) 600 MG 12 hr tablet Take 1 tablet (600 mg total) by mouth 2 (two) times daily as needed.     latanoprost (XALATAN) 0.005 % ophthalmic solution Place 1 drop into both eyes at bedtime. 2.5 mL 1   levocetirizine (XYZAL) 5 MG tablet Take 5 mg by mouth daily.     LORazepam (ATIVAN) 0.5 MG tablet TAKE 1/2 TO 1 TABLET BY MOUTH TWICE DAILY AS NEEDED ONLY WHEN NEEDED     Multiple Vitamins-Minerals (MULTIVITAMINS THER. W/MINERALS) TABS Take 1 tablet by mouth every morning.     SHINGRIX injection      tolterodine (DETROL LA) 2 MG 24 hr capsule Take by mouth.     No current facility-administered medications on file prior to visit.    Allergies  Allergen Reactions   Nitrous Oxide Other (See Comments)    Pt passed out and had confusion. Took a few hours to wake her up.   Avelox [Moxifloxacin Hcl In Nacl] Other (See Comments)    Severe headache    Cephalexin Other (See Comments)    Caused headache  Other Other (See Comments)    Anesthesia, needs antiemetic med   Quinolones Other (See Comments)    Headaches?   Tramadol Nausea Only and Other (See Comments)    Nausea and Dizziness, too.   Trimethoprim Other (See Comments)    Rapid heartbeat.   Tizanidine Hcl Other (See Comments)   Citalopram Hydrobromide Other (See Comments)    Pt can't remember what the side effect was.    Objective:  General: Alert and oriented x3 in no acute distress  Dermatology: Keratotic lesion present sub-met 5 on right and bilateral hallux with skin lines transversing the lesion, pain is present with direct pressure to the lesion with a central nucleated core noted, no webspace macerations, no ecchymosis bilateral, all nails x 10 are elongated and thickened consistent with onychomycosis there is lifting of the left  hallux nail with partial detachment and no underlying signs of infection once the loose no was removed.  Vascular: Dorsalis Pedis and Posterior Tibial pedal pulses 1/4, Capillary Fill Time 3 seconds, + pedal hair growth bilateral, no edema bilateral lower extremities, Temperature gradient within normal limits.  Neurology: Johney Maine sensation intact via light touch bilateral.  Musculoskeletal: Mild tenderness with palpation at the keratotic lesion site on Right greater than left, fat pad atrophy bunion and hammertoe deformity pes planus foot type with pain to right sub-met 5 at area of nucleation as noted above.  History of plantar fasciitis with a subjective pulling sensation noted to the left arch currently not bothersome at today's visit.  Muscular strength 5/5 in all groups without pain or limitation on range of motion.   Assessment and Plan: Problem List Items Addressed This Visit   None Visit Diagnoses     Porokeratosis    -  Primary   Benign skin lesion       Pain due to onychomycosis of nail       Right foot pain       Left foot pain          -Complete examination performed -Re-Discussed treatment options for calluses and nails -Mechanically debrided nails x10 using a sterile nail nipper without incident -At no additional charge, parred keratoic lesion using a chisel blade x1 on left and x2 on right; treated the area withSalinocaine covered with Band-Aid -Continue with offloading padding -Encouraged daily skin emollients like previous  -Advised good supportive shoes and inserts like before -Patient to return to office in 3 months or as needed or sooner if condition worsens.  Landis Martins, DPM

## 2021-06-08 DIAGNOSIS — D225 Melanocytic nevi of trunk: Secondary | ICD-10-CM | POA: Diagnosis not present

## 2021-06-08 DIAGNOSIS — L814 Other melanin hyperpigmentation: Secondary | ICD-10-CM | POA: Diagnosis not present

## 2021-06-08 DIAGNOSIS — L578 Other skin changes due to chronic exposure to nonionizing radiation: Secondary | ICD-10-CM | POA: Diagnosis not present

## 2021-06-08 DIAGNOSIS — D2271 Melanocytic nevi of right lower limb, including hip: Secondary | ICD-10-CM | POA: Diagnosis not present

## 2021-06-08 DIAGNOSIS — L821 Other seborrheic keratosis: Secondary | ICD-10-CM | POA: Diagnosis not present

## 2021-06-27 DIAGNOSIS — J301 Allergic rhinitis due to pollen: Secondary | ICD-10-CM | POA: Diagnosis not present

## 2021-06-27 DIAGNOSIS — J069 Acute upper respiratory infection, unspecified: Secondary | ICD-10-CM | POA: Diagnosis not present

## 2021-06-28 DIAGNOSIS — J069 Acute upper respiratory infection, unspecified: Secondary | ICD-10-CM | POA: Diagnosis not present

## 2021-07-06 DIAGNOSIS — R519 Headache, unspecified: Secondary | ICD-10-CM | POA: Diagnosis not present

## 2021-07-06 DIAGNOSIS — Z8709 Personal history of other diseases of the respiratory system: Secondary | ICD-10-CM | POA: Diagnosis not present

## 2021-07-06 DIAGNOSIS — R0982 Postnasal drip: Secondary | ICD-10-CM | POA: Diagnosis not present

## 2021-07-10 DIAGNOSIS — H401131 Primary open-angle glaucoma, bilateral, mild stage: Secondary | ICD-10-CM | POA: Diagnosis not present

## 2021-07-10 DIAGNOSIS — H524 Presbyopia: Secondary | ICD-10-CM | POA: Diagnosis not present

## 2021-07-11 DIAGNOSIS — H6123 Impacted cerumen, bilateral: Secondary | ICD-10-CM | POA: Diagnosis not present

## 2021-07-11 DIAGNOSIS — B370 Candidal stomatitis: Secondary | ICD-10-CM | POA: Diagnosis not present

## 2021-07-24 ENCOUNTER — Telehealth: Payer: Self-pay

## 2021-07-24 NOTE — Telephone Encounter (Signed)
Attempt made to contact LASHANN DAIRE is a 75 y.o. female re: New pt Pre appt call to collect history information.  -Allergy -Medication -Confirm pharmacy -OB history   Pt was not available. LM on the VM for the patient to call back

## 2021-07-25 ENCOUNTER — Other Ambulatory Visit: Payer: Self-pay

## 2021-07-25 ENCOUNTER — Encounter: Payer: Self-pay | Admitting: Obstetrics and Gynecology

## 2021-07-25 ENCOUNTER — Ambulatory Visit: Payer: Medicare Other | Admitting: Obstetrics and Gynecology

## 2021-07-25 VITALS — BP 153/79 | HR 84 | Ht 65.0 in | Wt 119.0 lb

## 2021-07-25 DIAGNOSIS — R350 Frequency of micturition: Secondary | ICD-10-CM | POA: Diagnosis not present

## 2021-07-25 DIAGNOSIS — R339 Retention of urine, unspecified: Secondary | ICD-10-CM

## 2021-07-25 LAB — POCT URINALYSIS DIPSTICK
Appearance: ABNORMAL
Bilirubin, UA: NEGATIVE
Blood, UA: NEGATIVE
Glucose, UA: NEGATIVE
Ketones, UA: NEGATIVE
Leukocytes, UA: NEGATIVE
Nitrite, UA: NEGATIVE
Protein, UA: NEGATIVE
Spec Grav, UA: 1.01 (ref 1.010–1.025)
Urobilinogen, UA: 0.2 E.U./dL
pH, UA: 7 (ref 5.0–8.0)

## 2021-07-25 NOTE — Progress Notes (Signed)
[facility] New Patient Evaluation and Consultation  Referring Provider: Leeroy Cha PCP: Leeroy Cha, MD Date of Service: 07/25/2021  SUBJECTIVE Chief Complaint: New Patient (Initial Visit) Barbara Rangel is a 75 y.o. female here for a consult on OAB./PVR: 261)  History of Present Illness: Barbara Rangel is a 75 y.o. Black or African-American female seen in consultation at the request of Dr. Fara Olden for evaluation of overactive bladder.    Review of records from Dr Fara Olden significant for: Has ruled out UTI and still has urgency. Has been working on bladder retraining and timed voids.   Urinary Symptoms: Urine leakage is rare- happened 2-3 times with urgency Currently on Detrol LA- started 3 months ago. Helped waking up at night.  Has open angle glaucoma.   Day time voids 8- in the morning goes about 5 times/ every 30 min when she wakes up.  Nocturia: 1 times per night to void. Voiding dysfunction: she empties her bladder well.  does not use a catheter to empty bladder.  When urinating, she feels the need to urinate multiple times in a row Drinks: water only  UTIs:  0  UTI's in the last year.   Denies history of blood in urine and kidney or bladder stones  Pelvic Organ Prolapse Symptoms:                  She Denies a feeling of a bulge the vaginal area.   Bowel Symptom: Bowel movements: 1 time(s) per day Stool consistency: soft  Straining: no.  Splinting: no.  Incomplete evacuation: no.  She Denies accidental bowel leakage / fecal incontinence Bowel regimen: none  Sexual Function Sexually active: no.   Pelvic Pain Denies pelvic pain   Past Medical History:  Past Medical History:  Diagnosis Date   Arthritis    degenerative arthritis   Breast cancer, right breast ([place]) 04/2004   T2N1 diagnosed June 2005, ER PR + and Her 2 negative, post mastectomy with axillary node dissection (possibly 9 or 11 nodes removed,  with possibly 7 or 9 involved) then treatment on NSABP B28 with dose dense adriamycin cytoxan followed by taxol, all of this Rx  in [place]. Adjuvant arimidex begun Jan 2006. No radiation.    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   Other and unspecified general anesthetics causing adverse effect in therapeutic use    hard to wake up   PONV (postoperative nausea and vomiting)    Syncope 09/15/2012     Past Surgical History:   Past Surgical History:  Procedure Laterality Date   ANKLE SURGERY  2011   left ankle with screws and plates   BREAST LUMPECTOMY     x2   cataract surgery  2005   right eye   COLONOSCOPY     COLONOSCOPY WITH PROPOFOL N/A 10/11/2014   Procedure: COLONOSCOPY WITH PROPOFOL;  Surgeon: Garlan Fair, MD;  Location: [facility];  Service: Endoscopy;  Laterality: N/A;   EYE SURGERY  2004   right eye-detached retina   left breast biopsy  2009   LUMBAR LAMINECTOMY/DECOMPRESSION MICRODISCECTOMY  10/31/2011   Procedure: LUMBAR LAMINECTOMY/DECOMPRESSION MICRODISCECTOMY;  Surgeon: Dahlia Bailiff;  Location: [facility];  Service: Orthopedics;  Laterality: Right;  L4-5 Decompression with Right Facet Decompression    MASTECTOMY  2005   right  d/t breast cancer;no sticks to right arm LYMPH NODES REMOVED.   RETINAL DETACHMENT SURGERY  2004   right breast biopsy  2005   TUBAL LIGATION  1980     Past OB/GYN History: OB History  Gravida Para Term Preterm AB Living  '4 4 4     4  '$ SAB IAB Ectopic Multiple Live Births          4    # Outcome Date GA Lbr Len/2nd Weight Sex Delivery Anes PTL Lv  4 Term      Vag-Spont     3 Term      Vag-Spont     2 Term      Vag-Spont     1 Term       Vag-Spont      Menopausal: Yes, Denies vaginal bleeding since menopause   Medications: She has a current medication list which includes the following prescription(s): acetaminophen, amlodipine, aspirin ec, atorvastatin, azelastine, calcium citrate-vitamin d, debrox, cranberry extract, dorzolamide-timolol, doxycycline, escitalopram, famotidine, guaifenesin, latanoprost, levocetirizine, lorazepam, multivitamins ther. w/minerals, shingrix, and tolterodine.   Allergies: Patient is allergic to nitrous oxide, avelox [moxifloxacin hcl in nacl], cephalexin, other, quinolones, tramadol, trimethoprim, tizanidine hcl, and citalopram hydrobromide.   Social History:  Social History   Tobacco Use   Smoking status: Never   Smokeless tobacco: Never  Vaping Use   Vaping Use: Never used  Substance Use Topics   Alcohol use: No   Drug use: No    Relationship status: married She lives with husband.   She is not employed. Regular exercise: No History of abuse: No  Family History:   Family History  Problem Relation Age of Onset   Colon cancer Mother    Breast cancer Cousin        on dads side   Breast cancer Cousin        on moms side   Anesthesia problems Neg Hx    Hypotension Neg Hx    Malignant hyperthermia Neg Hx    Pseudochol deficiency Neg Hx      Review of Systems: Review of Systems  Constitutional:  Negative for fever, malaise/fatigue and weight loss.  Respiratory:  Negative for cough, shortness of breath and wheezing.   Cardiovascular:  Negative for chest pain, palpitations and leg swelling.  Gastrointestinal:  Negative for abdominal pain and blood in stool.  Genitourinary:  Negative for dysuria.  Musculoskeletal:  Negative for myalgias.  Skin:  Negative for rash.  Neurological:  Negative for dizziness and headaches.  Endo/Heme/Allergies:  Does not bruise/bleed easily.  Psychiatric/Behavioral:  Negative for depression. The patient is not nervous/anxious.      OBJECTIVE Physical Exam: Vitals:   07/25/21 0840  BP: (!) 153/79  Pulse: 84  Weight: 119 lb (54 kg)  Height: '5\' 5"'$  (1.651 m)    Physical Exam Constitutional:      General: She is not in acute distress. Pulmonary:     Effort: Pulmonary effort is normal.  Abdominal:     General: There is no distension.     Palpations: Abdomen is soft.     Tenderness: There is no abdominal tenderness. There is no rebound.  Musculoskeletal:        General: No swelling. Normal range of motion.  Skin:    General: Skin is warm and  dry.     Findings: No rash.  Neurological:     Mental Status: She is alert and oriented to person, place, and time.  Psychiatric:        Mood and Affect: Mood normal.        Behavior: Behavior normal.     GU / Detailed Urogynecologic Evaluation:  Pelvic Exam: Normal external female genitalia; Bartholin's and Skene's glands normal in appearance; urethral meatus normal in appearance, no urethral masses or discharge.   CST: negative  Speculum exam reveals normal vaginal mucosa with atrophy. Cervix normal appearance. Uterus normal single, nontender. Adnexa no mass, fullness, tenderness.     Pelvic floor strength I/V  Pelvic floor musculature: Right levator tender, Right obturator tender, Left levator non-tender, Left obturator tender  POP-Q:   POP-Q  -1.5                                            Aa   -1.5                                           Ba  -6                                              C   2                                            Gh  3                                            Pb  8                                            tvl   -2                                            Ap  -2                                            Bp  -8                                              D     Rectal Exam:  Normal external rectum  Post-Void Residual (PVR) by Bladder Scan: In order to evaluate bladder emptying, we discussed  obtaining a postvoid residual and she agreed to this procedure.  Procedure: The ultrasound unit was placed  on the patient's abdomen in the suprapubic region after the patient had voided. A PVR of 261 ml was obtained by bladder scan.  Straight catheterization was performed for residual urine and 439m of clear urine was obtained.   Laboratory Results: POC urine: negative   ASSESSMENT AND PLAN Ms. MFuriois a 75y.o. with:  1. Incomplete bladder emptying   2. Urinary frequency    - PVR 4511m- She should stop the Detrol LA as this may be contributing to incomplete emptying.  - We discussed there are several etiologies for incomplete emptying and will need urodynamic testing to better qualify the issue.  - Recommended self- catheterization for a few days in order to record voided volumes and post-void residuals. She will do this for 3 days then call the office Friday with the results. Self- catheterization teaching and supplies provided.   Return for urodynamic testing.    MiJaquita FoldsMD   Medical Decision Making:  - Reviewed/ ordered a clinical laboratory test - Reviewed/ ordered medicine test - Review and summation of prior records

## 2021-07-25 NOTE — Patient Instructions (Signed)
URODYNAMICS (UDS) TEST INFORMATION  IMPORTANT: Please try to arrive with a comfortably full bladder!  Complete a 3 day bladder diary prior to your appointment.  What is UDS? Urodynamics is a bladder test used to evaluate how your bladder and urethra (tube you urinate out of) work to help find out the cause of your bladder symptoms and evaluate your bladder function in order to make the best treatment plan for you.   What to expect? A nurse will perform the test and will be with you during the entire exam. First we will have to empty your bladder on a special toilet.  After you have emptied your bladder, very small catheters (plastic tubing) will be placed into your bladder and into your vagina (or rectum). These special small catheters measure pressure to help measure your bladder function.  Your bladder will be gently filled with water and you will be asked to cough and strain at several different points during the test.   You will then be asked to empty your bladder in the special toilet with the catheters in place. Most patients can urinate (pee) easily with the catheters in place since the catheters are so small. In total this procedure lasts about 45 minutes to 1 hour.  After your test is completed, you will return (or possibly be seen the same day) to review the results, talk about treatment options and make a plan moving forward.  

## 2021-07-28 ENCOUNTER — Telehealth: Payer: Self-pay | Admitting: Obstetrics and Gynecology

## 2021-07-28 NOTE — Telephone Encounter (Signed)
-----   Message from [name], MD sent at 07/25/2021 10:13 AM EDT ----- Regarding: self cath Patient calling friday with self cath volumes

## 2021-07-28 NOTE — Telephone Encounter (Signed)
Patient never called with updated self-catheter volumes. Please reach out to her Monday to see how much she is voiding and how much she is catheterizing. If her PVR cath volumes are consistently <265m then she can discontinue, otherwise she should continue to catheterize at least 5 times per day (AM, Breakfast, lunch, dinner and before bed).   MJaquita Folds MD

## 2021-07-30 DIAGNOSIS — B370 Candidal stomatitis: Secondary | ICD-10-CM | POA: Diagnosis not present

## 2021-07-30 DIAGNOSIS — R634 Abnormal weight loss: Secondary | ICD-10-CM | POA: Diagnosis not present

## 2021-07-30 DIAGNOSIS — E639 Nutritional deficiency, unspecified: Secondary | ICD-10-CM | POA: Diagnosis not present

## 2021-08-02 DIAGNOSIS — E785 Hyperlipidemia, unspecified: Secondary | ICD-10-CM | POA: Diagnosis not present

## 2021-08-03 DIAGNOSIS — C50911 Malignant neoplasm of unspecified site of right female breast: Secondary | ICD-10-CM | POA: Diagnosis not present

## 2021-08-03 DIAGNOSIS — K1321 Leukoplakia of oral mucosa, including tongue: Secondary | ICD-10-CM | POA: Diagnosis not present

## 2021-08-05 ENCOUNTER — Telehealth: Payer: Self-pay | Admitting: Obstetrics & Gynecology

## 2021-08-05 DIAGNOSIS — N3001 Acute cystitis with hematuria: Secondary | ICD-10-CM | POA: Diagnosis not present

## 2021-08-06 DIAGNOSIS — K1321 Leukoplakia of oral mucosa, including tongue: Secondary | ICD-10-CM | POA: Diagnosis not present

## 2021-08-06 NOTE — Telephone Encounter (Signed)
Phone call for Urinary Sxs.  Patient will go to an outpatient clinic to do a U/A and Urine Culture.

## 2021-08-14 DIAGNOSIS — E785 Hyperlipidemia, unspecified: Secondary | ICD-10-CM | POA: Diagnosis not present

## 2021-08-14 DIAGNOSIS — M1990 Unspecified osteoarthritis, unspecified site: Secondary | ICD-10-CM | POA: Diagnosis not present

## 2021-08-14 DIAGNOSIS — M150 Primary generalized (osteo)arthritis: Secondary | ICD-10-CM | POA: Diagnosis not present

## 2021-08-14 DIAGNOSIS — I10 Essential (primary) hypertension: Secondary | ICD-10-CM | POA: Diagnosis not present

## 2021-08-14 DIAGNOSIS — K219 Gastro-esophageal reflux disease without esophagitis: Secondary | ICD-10-CM | POA: Diagnosis not present

## 2021-08-16 DIAGNOSIS — K219 Gastro-esophageal reflux disease without esophagitis: Secondary | ICD-10-CM | POA: Diagnosis not present

## 2021-08-16 DIAGNOSIS — R194 Change in bowel habit: Secondary | ICD-10-CM | POA: Diagnosis not present

## 2021-08-16 DIAGNOSIS — B370 Candidal stomatitis: Secondary | ICD-10-CM | POA: Diagnosis not present

## 2021-08-18 NOTE — Progress Notes (Signed)
Patient Care Team: Leeroy Cha, MD as PCP - General (Internal Medicine) Nicholas Lose, MD as Consulting Physician (Hematology and Oncology)  DIAGNOSIS:    ICD-10-CM   1. 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, unspecified estrogen receptor status ([place])  C50.011       SUMMARY OF ONCOLOGIC HISTORY: Oncology History  Breast cancer, right breast ([place])  01/10/2004 Initial Diagnosis   Patient found right breast cancer on breast self exam after negative mammogram same year, diagnosis March 2005 in [place]   04/26/2004 Surgery   Right mastectomy: T2 N2a M0 ER/PR positive HER-2 negative stage IIIa; 9 lymph nodes were involved (did not get radiation)   05/18/2004 - 09/21/2004 Chemotherapy   NSABP B 28 clinical trial with dose dense Adriamycin and Cytoxan followed by Taxol    11/12/2004 - 02/26/2019 Anti-estrogen oral therapy   Arimidex 1 mg daily     CHIEF COMPLIANT: Palpable lump in the right chest wall  INTERVAL HISTORY: Barbara Rangel is a 75 y.o. with above-mentioned history of right breast cancer treated with mastectomy, adjuvant chemotherapy, and completed anastrozole. She presents to the clinic today because she felt a palpable lump in the right chest wall.  It was waxing and waning with some days it appears to be much more prominent than other days.  She has been extremely anxious about lots of things in her life and therefore she wanted to come and discuss this finding with [place].  She denies any pain or discomfort.  She has lost some weight recently and she attributes that to stress and anxiety.  ALLERGIES:  is allergic to nitrous oxide, avelox [moxifloxacin hcl in nacl], cephalexin, other, quinolones, tramadol, trimethoprim, tizanidine hcl, and citalopram hydrobromide.  MEDICATIONS:  Current Outpatient Medications  Medication Sig Dispense Refill   acetaminophen (TYLENOL) 500 MG tablet Take 1,000 mg by mouth at bedtime.      amLODipine (NORVASC) 5 MG tablet Take 5 mg by mouth at bedtime.      aspirin EC 81 MG tablet Take 81 mg by mouth every morning.      atorvastatin (LIPITOR) 20 MG tablet Take 1 tablet by mouth every morning.     azelastine (ASTELIN) 0.1 % nasal spray   0   calcium citrate-vitamin D (CITRACAL+D) 315-200 MG-UNIT per tablet Take 2 tablets by mouth 2 (two) times daily.     carbamide peroxide (DEBROX) 6.5 % OTIC solution Place 5 drops into both ears 2 (two) times daily. 15 mL 0   Cranberry Extract 250 MG TABS Take 1 capsule by mouth at bedtime.      dorzolamide-timolol (COSOPT) 22.3-6.8 MG/ML ophthalmic solution SMARTSIG:In Eye(s)     doxycycline (VIBRA-TABS) 100 MG tablet Take 100 mg by mouth 2 (two) times daily.     escitalopram (LEXAPRO) 5 MG tablet Take 5 mg by mouth daily.     famotidine (PEPCID) 20 MG tablet TK 1 T PO QD HS PRN     guaiFENesin (MUCINEX) 600 MG 12 hr tablet Take 1 tablet (600 mg total) by mouth 2 (two) times daily as needed.     latanoprost (XALATAN) 0.005 % ophthalmic solution Place 1 drop into both eyes at bedtime. 2.5 mL 1   levocetirizine (XYZAL) 5 MG tablet Take 5 mg by mouth daily.     LORazepam (ATIVAN) 0.5 MG tablet TAKE 1/2 TO 1 TABLET BY MOUTH TWICE DAILY AS NEEDED ONLY WHEN NEEDED     Multiple Vitamins-Minerals (MULTIVITAMINS THER. W/MINERALS) TABS  Take 1 tablet by mouth every morning.     SHINGRIX injection      tolterodine (DETROL LA) 2 MG 24 hr capsule Take 2 mg by mouth daily.     No current facility-administered medications for this visit.    PHYSICAL EXAMINATION: ECOG PERFORMANCE STATUS: 1 - Symptomatic but completely ambulatory  Vitals:   08/20/21 1504  BP: 135/71  Pulse: 99  Resp: 17  Temp: (!) 97.5 F (36.4 C)  SpO2: 99%   Filed Weights   08/20/21 1504  Weight: 117 lb 3.2 oz (53.2 kg)    BREAST: At the site of the palpable discomfort there is nothing but costochondral junction fullness.  There is no soft tissue swelling.     LABORATORY  DATA:  I have reviewed the data as listed CMP Latest Ref Rng & Units 08/31/2019 08/16/2019 02/24/2017  Glucose 70 - 99 mg/dL 112(H) 86 82  BUN 8 - 23 mg/dL 11 12 14.4  Creatinine 0.44 - 1.00 mg/dL 0.76 0.78 0.8  Sodium 135 - 145 mmol/L 138 142 143  Potassium 3.5 - 5.1 mmol/L 3.7 4.2 4.0  Chloride 98 - 111 mmol/L 102 103 -  CO2 22 - 32 mmol/L _0 Calcium 8.9 - 10.3 mg/dL 9.9 10.5(H) 10.4  Total Protein 6.0 - 8.5 g/dL - 7.2 7.0  Total Bilirubin 0.0 - 1.2 mg/dL - 0.5 0.60  Alkaline Phos 39 - 117 IU/L - 79 60  AST 0 - 40 IU/L - 34 27  ALT 0 - 32 IU/L - 28 26    Lab Results  Component Value Date   WBC 5.8 08/31/2019   HGB 15.4 (H) 08/31/2019   HCT 46.3 (H) 08/31/2019   MCV 98.3 08/31/2019   PLT 220 08/31/2019   NEUTROABS 3.0 02/24/2017    ASSESSMENT & PLAN:  Breast cancer, right breast ([place]) Stage IIIa right breast cancer T2 N2 M0 diagnosed in June 2005 in [place] treated with mastectomy followed by adjuvant chemotherapy. 7 on 9 lymph nodes were apparently involved., Did not get adjuvant radiation (Unknown reasons). Arimidex 1 mg daily started January 2006 stopped 02/26/2019   Osteopenia: Patient is on weightbearing exercise program.  Bone density 02/17/2020: T score -1.5: Stable We will obtain a bone density test next year along with her mammogram.   Breast cancer surveillance: 1.  Breast exam 08/20/2021: Right chest wall palpable swelling is related to costochondral junction and it is unlikely to be related to breast cancer.  However to be certain I would like to get a chest x-ray today. 2.  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 Return to clinic in April 2023 for follow-up    No orders of the defined types were placed in this encounter.  The patient has a good understanding of the overall plan. she agrees with it. she  will call with any problems that may develop before the next visit here.  Total time spent: 30 mins including face to face time and time spent for planning, charting and coordination of care  Rulon Eisenmenger, MD, MPH 08/20/2021  I, Thana Ates, am acting as scribe for Dr. Nicholas Lose.  I have reviewed the above documentation for accuracy and completeness, and I agree with the above.

## 2021-08-20 ENCOUNTER — Ambulatory Visit (HOSPITAL_COMMUNITY)
Admission: RE | Admit: 2021-08-20 | Discharge: 2021-08-20 | Disposition: A | Discharge status: Home / Self Care | Payer: Medicare Other | Source: Ambulatory Visit | Attending: Hematology and Oncology | Admitting: Hematology and Oncology

## 2021-08-20 ENCOUNTER — Other Ambulatory Visit: Payer: Self-pay

## 2021-08-20 ENCOUNTER — Inpatient Hospital Stay: Payer: Medicare Other | Attending: Hematology and Oncology | Admitting: Hematology and Oncology

## 2021-08-20 DIAGNOSIS — C50011 Malignant neoplasm of nipple and areola, right female breast: Secondary | ICD-10-CM

## 2021-08-20 DIAGNOSIS — R222 Localized swelling, mass and lump, trunk: Secondary | ICD-10-CM | POA: Diagnosis not present

## 2021-08-20 MED ORDER — OMEPRAZOLE 20 MG PO CPDR: 20.0000 mg | DELAYED_RELEASE_CAPSULE | Freq: Every day | ORAL | Status: AC

## 2021-08-20 MED ORDER — CETIRIZINE HCL 10 MG PO TABS: 10.0000 mg | ORAL_TABLET | Freq: Every day | ORAL | Status: DC

## 2021-08-20 NOTE — Assessment & Plan Note (Signed)
Stage IIIa right breast cancer T2 N2 M0 diagnosed in June 2005 in [place] treated with mastectomy followed by adjuvant chemotherapy. 7 on 9 lymph nodes were apparently involved., Did not get adjuvant radiation(Unknown reasons). Arimidex 1 mg daily started January 2006stopped 02/26/2019  Osteopenia: Patient is on weightbearing exercise program. Bone density 02/17/2020: T score -1.5: Stable We will obtain a bone density test next year along with her mammogram.  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 Return to clinic in 1 year for follow-up

## 2021-08-22 DIAGNOSIS — I10 Essential (primary) hypertension: Secondary | ICD-10-CM | POA: Diagnosis not present

## 2021-08-22 DIAGNOSIS — Z23 Encounter for immunization: Secondary | ICD-10-CM | POA: Diagnosis not present

## 2021-08-22 DIAGNOSIS — E785 Hyperlipidemia, unspecified: Secondary | ICD-10-CM | POA: Diagnosis not present

## 2021-08-23 DIAGNOSIS — J343 Hypertrophy of nasal turbinates: Secondary | ICD-10-CM | POA: Diagnosis not present

## 2021-08-23 DIAGNOSIS — J310 Chronic rhinitis: Secondary | ICD-10-CM | POA: Diagnosis not present

## 2021-08-23 DIAGNOSIS — J342 Deviated nasal septum: Secondary | ICD-10-CM | POA: Diagnosis not present

## 2021-08-23 DIAGNOSIS — R0982 Postnasal drip: Secondary | ICD-10-CM | POA: Diagnosis not present

## 2021-08-30 ENCOUNTER — Other Ambulatory Visit: Payer: Self-pay

## 2021-08-30 ENCOUNTER — Encounter: Payer: Self-pay | Admitting: Obstetrics and Gynecology

## 2021-08-30 ENCOUNTER — Ambulatory Visit (INDEPENDENT_AMBULATORY_CARE_PROVIDER_SITE_OTHER): Payer: Medicare Other | Admitting: Obstetrics and Gynecology

## 2021-08-30 VITALS — BP 142/83 | HR 94 | Wt 116.0 lb

## 2021-08-30 DIAGNOSIS — R339 Retention of urine, unspecified: Secondary | ICD-10-CM

## 2021-08-30 NOTE — Patient Instructions (Signed)

## 2021-08-30 NOTE — Progress Notes (Signed)
[facility] Return Visit  SUBJECTIVE  History of Present Illness: Barbara Rangel is a 75 y.o. female seen in follow-up for incomplete bladder emptying. She completed a bladder diary after last visit with catheterized volumes.   Also treated for a urinary tract infection by PCP with macrobid.   Has not been catheterizing since doing the bladder diaries. She feels that she urinates frequently in the morning unless she catheterizes.              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   Past Surgical History: She  has a past surgical history that includes Tubal ligation (1980); Eye surgery (2004); cataract surgery (2005); right breast biopsy (2005); left breast biopsy (2009); Ankle surgery (2011); Colonoscopy; Retinal detachment surgery (2004); Mastectomy (2005); Lumbar laminectomy/decompression microdiscectomy (10/31/2011); Colonoscopy with propofol (N/A, 10/11/2014); and Breast lumpectomy.   Medications: She has a current medication list which includes the following prescription(s): acetaminophen, amlodipine, aspirin ec, atorvastatin, calcium citrate-vitamin d, debrox, cetirizine, cranberry extract, dorzolamide-timolol, guaifenesin, latanoprost, multivitamins ther. w/minerals, omeprazole, and shingrix.   Allergies: Patient is allergic to nitrous oxide, avelox [moxifloxacin hcl in nacl], cephalexin, other, quinolones, tramadol, trimethoprim, tizanidine hcl, and citalopram hydrobromide.   Social History: Patient  reports that she has never smoked. She has never used smokeless tobacco.  She reports that she does not drink alcohol and does not use drugs.      OBJECTIVE     Physical Exam: Vitals:   08/30/21 1536  BP: (!) 142/83  Pulse: 94  Weight: 116 lb (52.6 kg)   Gen: No apparent distress, A&O x 3.  Detailed Urogynecologic Evaluation:  Deferred.    ASSESSMENT AND PLAN    Barbara Rangel is a 75 y.o. with:  1. Incomplete bladder emptying    - Catheterized volumes show that sometimes she empties her bladder and other times has 358ml (10oz) remaining. We discussed that long term incomplete emptying can result in damage to kidneys, so she should be catheterizing at least 3 times per day to ensure she empties throughout the day. Previously sent cath supplies.  - In order to better determine etiology, recommend urodynamic testing- will schedule. Does not have any significant prolapse that would contribute to incomplete emptying.  - We discussed that if emptying if do to detrusor underactivity then she may be a candidate for sacral neuromodulation. We reviewed SNM and how it works, and need to a trial phase. She will consider this option.   Return for urodynamics  [name], MD  Time spent: I spent 45 minutes dedicated to the care of this patient on the date of this encounter to include pre-visit review of records, face-to-face time with the patient discussing options and post visit documentation and ordering medication/ testing.

## 2021-09-03 DIAGNOSIS — R339 Retention of urine, unspecified: Secondary | ICD-10-CM | POA: Diagnosis not present

## 2021-09-06 ENCOUNTER — Ambulatory Visit: Payer: Medicare Other | Admitting: Sports Medicine

## 2021-09-06 ENCOUNTER — Encounter: Payer: Self-pay | Admitting: Sports Medicine

## 2021-09-06 ENCOUNTER — Other Ambulatory Visit: Payer: Self-pay

## 2021-09-06 DIAGNOSIS — M79609 Pain in unspecified limb: Secondary | ICD-10-CM

## 2021-09-06 DIAGNOSIS — B351 Tinea unguium: Secondary | ICD-10-CM

## 2021-09-06 DIAGNOSIS — I739 Peripheral vascular disease, unspecified: Secondary | ICD-10-CM | POA: Diagnosis not present

## 2021-09-06 DIAGNOSIS — L989 Disorder of the skin and subcutaneous tissue, unspecified: Secondary | ICD-10-CM

## 2021-09-06 DIAGNOSIS — Q828 Other specified congenital malformations of skin: Secondary | ICD-10-CM

## 2021-09-06 NOTE — Progress Notes (Signed)
Subjective: Barbara Rangel is a 75 y.o. female patient who returns to office for follow up evaluation of right greater than left foot pain secondary to callus skin and for nail trim. Patient reports that she is doing okay.  Denies any current time.  States that she noticed that she has lost some weight and she is trying to figure out why but otherwise denies any other concerns at this time.  Patient Active Problem List   Diagnosis Date Noted   Carotid artery stenosis 01/05/2020   Neck pain 03/30/2018   Osteopenia determined by x-ray 08/28/2016   History of right breast cancer 08/28/2016   Malignant neoplasm of right female breast ([place]) 01/23/2016   History of chemotherapy 01/23/2016   Estrogen deficiency 07/29/2015   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/24/2015   Dense breast tissue 01/24/2015   Hx of adenomatous colonic polyps 01/24/2015   Plantar fasciitis, bilateral 01/24/2015   Breast cancer, right breast ([place]) 07/15/2013   Syncope 09/15/2012   Hypokalemia 09/15/2012   Chronic back pain    Chronic urinary tract infection    Hypertension    Arthritis    Synovial cyst of lumbar facet joint 11/01/2011    Current Outpatient Medications on File Prior to Visit  Medication Sig Dispense Refill   acetaminophen (TYLENOL) 500 MG tablet Take 1,000 mg by mouth at bedtime.     amLODipine (NORVASC) 5 MG tablet Take 5 mg by mouth at bedtime.      aspirin EC 81 MG tablet Take 81 mg by mouth every morning.      atorvastatin (LIPITOR) 20 MG tablet Take 1 tablet by mouth every morning.     calcium citrate-vitamin D (CITRACAL+D) 315-200 MG-UNIT per tablet Take 2 tablets by mouth 2 (two) times daily.     carbamide peroxide (DEBROX) 6.5 % OTIC solution Place 5 drops into both ears 2 (two) times daily. 15 mL 0   cetirizine (ZYRTEC ALLERGY) 10 MG tablet Take 1 tablet (10 mg total) by mouth daily.     Cranberry Extract 250 MG TABS Take 1 capsule by mouth at bedtime.      dorzolamide-timolol  (COSOPT) 22.3-6.8 MG/ML ophthalmic solution SMARTSIG:In Eye(s)     guaiFENesin (MUCINEX) 600 MG 12 hr tablet Take 1 tablet (600 mg total) by mouth 2 (two) times daily as needed.     latanoprost (XALATAN) 0.005 % ophthalmic solution Place 1 drop into both eyes at bedtime. 2.5 mL 1   Multiple Vitamins-Minerals (MULTIVITAMINS THER. W/MINERALS) TABS Take 1 tablet by mouth every morning.     omeprazole (PRILOSEC) 20 MG capsule Take 1 capsule (20 mg total) by mouth daily.     SHINGRIX injection      No current facility-administered medications on file prior to visit.    Allergies  Allergen Reactions   Nitrous Oxide Other (See Comments)    Pt passed out and had confusion. Took a few hours to wake her up.   Avelox [Moxifloxacin Hcl In Nacl] Other (See Comments)    Severe headache    Cephalexin Other (See Comments)    Caused headache   Other Other (See Comments)    Anesthesia, needs antiemetic med   Quinolones Other (See Comments)    Headaches?   Tramadol Nausea Only and Other (See Comments)    Nausea and Dizziness, too.   Trimethoprim Other (See Comments)    Rapid heartbeat.   Tizanidine Hcl Other (See Comments)   Citalopram Hydrobromide Other (See Comments)  Pt can't remember what the side effect was.    Objective:  General: Alert and oriented x3 in no acute distress  Dermatology: Keratotic lesion present sub-met 5 on right and bilateral hallux with skin lines transversing the lesion, pain is present with direct pressure to the lesion with a central nucleated core noted, no webspace macerations, no ecchymosis bilateral, all nails x 10 are elongated and thickened consistent with onychomycosis there is lifting of the left hallux nail with partial detachment and no underlying signs of infection once the loose no was removed.  Vascular: Dorsalis Pedis and Posterior Tibial pedal pulses 1/4, Capillary Fill Time 3 seconds, + pedal hair growth bilateral, no edema bilateral lower extremities,  Temperature gradient within normal limits.  Neurology: Johney [place] sensation intact via light touch bilateral.  Musculoskeletal: Mild tenderness with palpation at the keratotic lesion site on Right greater than left, fat pad atrophy bunion and hammertoe deformity pes planus foot type with pain to right sub-met 5 at area of nucleation as noted above.  Muscle strength within normal limits.  Assessment and Plan: Problem List Items Addressed This Visit   None Visit Diagnoses     Porokeratosis    -  Primary   Benign skin lesion       Pain due to onychomycosis of nail       PVD (peripheral vascular disease) ([place])          -Complete examination performed -Re-Discussed treatment options for calluses and nails -Mechanically debrided nails x10 using a sterile nail nipper without incident -At no additional charge, parred keratoic lesion using a chisel blade x1 on left and x2 on right without incident -Continue with daily skin emollients like before -Continue with good supportive shoes daily for foot type -Patient to return to office in 3 months or as needed or sooner if condition worsens.  Landis Martins, DPM

## 2021-09-11 DIAGNOSIS — R339 Retention of urine, unspecified: Secondary | ICD-10-CM | POA: Diagnosis not present

## 2021-09-18 ENCOUNTER — Telehealth (INDEPENDENT_AMBULATORY_CARE_PROVIDER_SITE_OTHER): Payer: Medicare Other

## 2021-09-18 ENCOUNTER — Encounter: Payer: Self-pay | Admitting: Obstetrics and Gynecology

## 2021-09-18 ENCOUNTER — Ambulatory Visit (INDEPENDENT_AMBULATORY_CARE_PROVIDER_SITE_OTHER): Payer: Medicare Other

## 2021-09-18 ENCOUNTER — Other Ambulatory Visit: Payer: Self-pay

## 2021-09-18 ENCOUNTER — Telehealth: Payer: Self-pay | Admitting: Obstetrics and Gynecology

## 2021-09-18 DIAGNOSIS — N3000 Acute cystitis without hematuria: Secondary | ICD-10-CM

## 2021-09-18 DIAGNOSIS — R350 Frequency of micturition: Secondary | ICD-10-CM | POA: Diagnosis not present

## 2021-09-18 LAB — POCT URINALYSIS DIPSTICK
Appearance: ABNORMAL
Bilirubin, UA: NEGATIVE
Glucose, UA: NEGATIVE
Nitrite, UA: NEGATIVE
Protein, UA: NEGATIVE
Spec Grav, UA: 1.015 (ref 1.010–1.025)
Urobilinogen, UA: 0.2 E.U./dL
pH, UA: 7 (ref 5.0–8.0)

## 2021-09-18 MED ORDER — NITROFURANTOIN MONOHYD MACRO 100 MG PO CAPS: 100.0000 mg | ORAL_CAPSULE | Freq: Two times a day (BID) | ORAL | 0 refills | Status: AC

## 2021-09-18 NOTE — Telephone Encounter (Signed)
Barbara Rangel is a 75 y.o. female called in complains of burning, irritation and back pain. Pt is asking to leave a urine sample. Pt is going to come drop off a urine sample today at 1pm.

## 2021-09-18 NOTE — Telephone Encounter (Signed)
Called patient re urinalysis. She is having burning with urination that started yesterday. Now having more urgency and frequency and difficulty holding urine. Has been self-catheterizing 5 times a day. POC urine shows moderate blood, large leukocytes. Will treat with macrobid 100 BID x 5 days. Urine culture sent.   [name], MD

## 2021-09-18 NOTE — Progress Notes (Signed)
Barbara Rangel is a 75 y.o. female complains of UTI sx.  Pt dropped off and gave a urine sample.

## 2021-09-18 NOTE — Patient Instructions (Signed)
Pt will be contacted when the urine culture results are back. Pt will continue AZO for relief.

## 2021-09-26 ENCOUNTER — Telehealth: Payer: Self-pay | Admitting: Obstetrics and Gynecology

## 2021-09-26 ENCOUNTER — Other Ambulatory Visit: Payer: Self-pay | Admitting: Obstetrics and Gynecology

## 2021-09-26 ENCOUNTER — Other Ambulatory Visit: Payer: Self-pay

## 2021-09-26 ENCOUNTER — Encounter: Payer: Self-pay | Admitting: Obstetrics and Gynecology

## 2021-09-26 ENCOUNTER — Ambulatory Visit (INDEPENDENT_AMBULATORY_CARE_PROVIDER_SITE_OTHER): Payer: Medicare Other

## 2021-09-26 DIAGNOSIS — N3000 Acute cystitis without hematuria: Secondary | ICD-10-CM

## 2021-09-26 DIAGNOSIS — R350 Frequency of micturition: Secondary | ICD-10-CM

## 2021-09-26 LAB — URINE CULTURE

## 2021-09-26 LAB — POCT URINALYSIS DIPSTICK
Appearance: ABNORMAL
Bilirubin, UA: NEGATIVE
Glucose, UA: NEGATIVE
Ketones, UA: NEGATIVE
Nitrite, UA: NEGATIVE
Protein, UA: NEGATIVE
Spec Grav, UA: 1.01 (ref 1.010–1.025)
Urobilinogen, UA: 0.2 E.U./dL
pH, UA: 5.5 (ref 5.0–8.0)

## 2021-09-26 MED ORDER — CEPHALEXIN 250 MG PO CAPS: 250.0000 mg | ORAL_CAPSULE | Freq: Four times a day (QID) | ORAL | 0 refills | Status: AC

## 2021-09-26 NOTE — Telephone Encounter (Signed)
Called patient re: urine culture which showed two strains of bacteria. One was treated appropriately with nitrofurantoin. For the other strain, will send keflex x7 days. She has previously had an intolerance to keflex with a headache but she does not feel it was a strong reaction and wants to try it again. Rx sent to pharmacy.   [name], MD

## 2021-09-26 NOTE — Progress Notes (Signed)
Barbara Rangel is a 75 y.o. female here to drop off a urine sample. Pt complains of uti sx with cloudy urine.  Pt will be contacted when results are back Dr Wannetta Sender notified.

## 2021-09-27 NOTE — Patient Instructions (Signed)
Pt will be contacted when the culture comes back

## 2021-09-28 DIAGNOSIS — N390 Urinary tract infection, site not specified: Secondary | ICD-10-CM | POA: Diagnosis not present

## 2021-09-28 DIAGNOSIS — I10 Essential (primary) hypertension: Secondary | ICD-10-CM | POA: Diagnosis not present

## 2021-10-03 ENCOUNTER — Telehealth: Payer: Self-pay | Admitting: *Deleted

## 2021-10-03 NOTE — Telephone Encounter (Signed)
Called pt no answer not able to leave voice mail.

## 2021-10-05 LAB — URINE CULTURE

## 2021-10-05 MED ORDER — CIPROFLOXACIN HCL 500 MG PO TABS: 500.0000 mg | ORAL_TABLET | Freq: Two times a day (BID) | ORAL | 0 refills | Status: DC

## 2021-10-05 MED ORDER — CIPROFLOXACIN HCL 500 MG PO TABS: 500.0000 mg | ORAL_TABLET | Freq: Two times a day (BID) | ORAL | 0 refills | Status: AC

## 2021-10-05 NOTE — Addendum Note (Signed)
Addended by: [name] on: 10/05/2021 09:34 PM   Modules accepted: Orders

## 2021-10-05 NOTE — Progress Notes (Signed)
Pt called with new symptoms. Will treat based on last culture- ciprofloxacin 500 BID x 4 days. Has urodynamics scheduled next week so will treat through that day.   [name], MD

## 2021-10-08 DIAGNOSIS — R339 Retention of urine, unspecified: Secondary | ICD-10-CM | POA: Diagnosis not present

## 2021-10-09 ENCOUNTER — Ambulatory Visit (INDEPENDENT_AMBULATORY_CARE_PROVIDER_SITE_OTHER): Payer: Medicare Other | Admitting: Obstetrics and Gynecology

## 2021-10-09 ENCOUNTER — Other Ambulatory Visit: Payer: Self-pay

## 2021-10-09 VITALS — BP 158/78 | HR 83 | Ht 65.0 in | Wt 115.0 lb

## 2021-10-09 DIAGNOSIS — R339 Retention of urine, unspecified: Secondary | ICD-10-CM

## 2021-10-09 DIAGNOSIS — R350 Frequency of micturition: Secondary | ICD-10-CM | POA: Diagnosis not present

## 2021-10-09 LAB — POCT URINALYSIS DIPSTICK
Appearance: NORMAL
Bilirubin, UA: NEGATIVE
Glucose, UA: NEGATIVE
Ketones, UA: NEGATIVE
Nitrite, UA: NEGATIVE
Protein, UA: NEGATIVE
Spec Grav, UA: 1.015 (ref 1.010–1.025)
Urobilinogen, UA: 0.2 E.U./dL
pH, UA: 6 (ref 5.0–8.0)

## 2021-10-09 NOTE — Patient Instructions (Signed)

## 2021-10-11 DIAGNOSIS — I10 Essential (primary) hypertension: Secondary | ICD-10-CM | POA: Diagnosis not present

## 2021-10-11 DIAGNOSIS — B952 Enterococcus as the cause of diseases classified elsewhere: Secondary | ICD-10-CM | POA: Diagnosis not present

## 2021-10-11 DIAGNOSIS — N390 Urinary tract infection, site not specified: Secondary | ICD-10-CM | POA: Diagnosis not present

## 2021-10-11 NOTE — Progress Notes (Signed)
[facility] Urodynamics Procedure  Referring Physician: Leeroy Cha,* Date of Procedure: 10/09/2021  Barbara Rangel is a 75 y.o. female who presents for urodynamic evaluation. Indication(s) for study:  incomplete bladder emptying  Vital Signs: BP (!) 158/78   Pulse 83   Ht 5\' 5"  (1.651 m)   Wt 115 lb (52.2 kg)   BMI 19.14 kg/m   Laboratory Results: A catheterized urine specimen revealed:  POC urine: trace leukocytes  Voiding Diary: Performed previously- see prior notes  Procedure Timeout:  The correct patient was verified and the correct procedure was verified. The patient was in the correct position and safety precautions were reviewed based on at the patient's history.  Urodynamic Procedure A 61F dual lumen urodynamics catheter was placed under sterile conditions into the patient's bladder. A 61F catheter was placed into the vagina in order to measure abdominal pressure. EMG patches were placed in the appropriate position.  All connections were confirmed and calibrations/adjusted made. Saline was instilled into the bladder through the dual lumen catheters.  Cough/valsalva pressures were measured periodically during filling.  Patient was allowed to void.  The bladder was then emptied of its residual.  UROFLOW: Revealed a Qmax of 8 mL/sec.  She voided 105 mL and had a residual of 215 mL.  It was a normal pattern and represented normal habits though interpretation limited due to low voided volume.  CMG: This was performed with sterile water in the sitting position at a fill rate of 30 mL/min.    First sensation of fullness was 133 mLs,  First urge was 171 mLs,  Strong urge was 278 mLs and  Capacity was 420 mLs  Stress incontinence was not demonstrated Highest negative CLPP was 88 cmH20 at 304 ml. Highest negative VLPP was 61 cmH20at 304 ml.  Detrusor function was normal, with no phasic contractions seen.    Compliance:  normal. End fill  detrusor pressure was 4cmH20.  Calculated compliance was 175mL/cmH20  UPP: MUCP without barrier reduction was 66 cm of water.    MICTURITION STUDY: Voiding was performed without reduction in the sitting position. She was unable to void. Pdet reached 45cm water with a sustained contraction but no flow was present. She had a residual of 420 mL.  Abdominal straining was not present  EMG: This was performed with patches.  She had voluntary contractions, recruitment with fill was present and urethral sphincter was not relaxed with void.  The details of the procedure with the study tracings have been scanned into EPIC.   Urodynamic Impression:  1. Sensation was normal; capacity was normal 2. Stress Incontinence was not demonstrated. 3. Detrusor Overactivity was not demonstrated. 4. Emptying was dysfunctional with a elevated PVR, a sustained detrusor contraction present,  abdominal straining not present, dyssynergic urethral sphincter activity on EMG.  Plan: - The patient will follow up  to discuss the findings and treatment options.   [name], MD

## 2021-10-12 ENCOUNTER — Ambulatory Visit (INDEPENDENT_AMBULATORY_CARE_PROVIDER_SITE_OTHER): Payer: Medicare Other | Admitting: Obstetrics and Gynecology

## 2021-10-12 ENCOUNTER — Encounter: Payer: Self-pay | Admitting: Obstetrics and Gynecology

## 2021-10-12 ENCOUNTER — Other Ambulatory Visit: Payer: Self-pay

## 2021-10-12 VITALS — BP 128/77 | HR 77

## 2021-10-12 DIAGNOSIS — N390 Urinary tract infection, site not specified: Secondary | ICD-10-CM | POA: Diagnosis not present

## 2021-10-12 DIAGNOSIS — M62838 Other muscle spasm: Secondary | ICD-10-CM

## 2021-10-12 DIAGNOSIS — R339 Retention of urine, unspecified: Secondary | ICD-10-CM | POA: Diagnosis not present

## 2021-10-12 MED ORDER — NITROFURANTOIN MONOHYD MACRO 100 MG PO CAPS: 100.0000 mg | ORAL_CAPSULE | Freq: Every day | ORAL | 5 refills | Status: DC

## 2021-10-12 MED ORDER — DIAZEPAM 5 MG PO TABS: ORAL_TABLET | ORAL | 0 refills | Status: DC

## 2021-10-12 NOTE — Patient Instructions (Signed)
Try valium 5mg  in the vagina at night for two weeks. If it works well for helping your bladder to empty, then you can continue it. Otherwise, if you see no benefit, discontinue after 2 weeks.   Referral placed to physical therapy.   Have scheduled you for cystoscopy to look in the bladder to ensure no masses present.   Take antibiotics (nitrofurantoin) once daily for prevention of urinary tract infections for 6 months.   Continue to self- catheterize 3-5 times per day.

## 2021-10-12 NOTE — Progress Notes (Signed)
[facility] Return Visit  SUBJECTIVE  History of Present Illness: Barbara Rangel is a 75 y.o. female seen in follow-up after urodynamic testing.   Also has had two urinary tract infections in the last month. Just completed course of ciprofloxacin. Has been self- catheterizing at least 3 times per day. Has been recording PVR cath volumes and occasionally will get 6, 8 or 10 oz, but usually will have 5oz or less. She notices her anxiety may be contributing and causes urinary hesitancy. She is working with a Transport planner.   Urodynamic Impression:  1. Sensation was normal; capacity was normal 2. Stress Incontinence was not demonstrated. 3. Detrusor Overactivity was not demonstrated. 4. Emptying was dysfunctional with a elevated PVR, a sustained detrusor contraction present,  abdominal straining not present, dyssynergic urethral sphincter activity on EMG.  Urodynamics indicative of obstructive voiding pattern.   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   Past Surgical History: She  has a past surgical history that includes Tubal ligation (1980); Eye surgery (2004); cataract surgery (2005); right breast biopsy (2005); left breast biopsy (2009); Ankle surgery (2011); Colonoscopy; Retinal detachment surgery (2004); Mastectomy (2005); Lumbar laminectomy/decompression microdiscectomy (10/31/2011); Colonoscopy with propofol (N/A, 10/11/2014); and Breast lumpectomy.   Medications: She has a current medication list which includes the following prescription(s): acetaminophen, amlodipine, aspirin ec,  atorvastatin, calcium citrate-vitamin d, debrox, cetirizine, cranberry extract, diazepam, dorzolamide-timolol, guaifenesin, latanoprost, multivitamins ther. w/minerals, nitrofurantoin (macrocrystal-monohydrate), omeprazole, and shingrix.   Allergies: Patient is allergic to nitrous oxide, avelox [moxifloxacin hcl in nacl], cephalexin, other, quinolones, tramadol, trimethoprim, tizanidine hcl, and citalopram hydrobromide.   Social History: Patient  reports that she has never smoked. She has never used smokeless tobacco. She reports that she does not drink alcohol and does not use drugs.      OBJECTIVE     Physical Exam: Vitals:   10/12/21 1026  BP: 128/77  Pulse: 77   Gen: No apparent distress, A&O x 3.  Detailed Urogynecologic Evaluation:  Deferred. Prior exam showed: Pelvic floor musculature: Right levator tender, Right obturator tender, Left levator non-tender, Left obturator tender   POP-Q:    POP-Q   -1.5                                            Aa   -1.5                                           Ba   -6                                              C    2  Gh   3                                            Pb   8                                            tvl    -2                                            Ap   -2                                            Bp   -8                                              D         ASSESSMENT AND PLAN    [name] is a 75 y.o. with:  1. Recurrent UTI   2. Incomplete bladder emptying   3. Levator spasm    Recurrent uti - For treatment of recurrent urinary tract infections, we discussed management of recurrent UTIs including prophylaxis with a daily low dose antibiotic. Prescribed macrobid 100mg  daily x6 months  2. Incomplete emptying/ levator spasm - we reviewed that she showed an obstructive pattern on urodynamics as she had a good detrusor contraction but no flow. She  does not have any significant prolapse. She did show high tone pelvic floor muscles on exam, and this would be consistent as the retention seems to be intermittent and related to anxiety.  - Will prescribe vaginal valium as a muscle relaxant. Take 5mg  at night for two weeks. If no improvement in emptying, can discontinue. Pelvic floor physical therapy referral also placed.  - We also discussed the possibility of urethral blockage and will plan for cystoscopy to rule out any masses.   [name], MD  Time spent: I spent 25 minutes dedicated to the care of this patient on the date of this encounter to include pre-visit review of records, face-to-face time with the patient discussing treatment options and post visit documentation and ordering medication/ testing.

## 2021-10-25 ENCOUNTER — Encounter: Payer: Self-pay | Admitting: Physical Therapy

## 2021-10-25 ENCOUNTER — Encounter: Payer: Medicare Other | Attending: Internal Medicine | Admitting: Physical Therapy

## 2021-10-25 ENCOUNTER — Other Ambulatory Visit: Payer: Self-pay

## 2021-10-25 DIAGNOSIS — M6281 Muscle weakness (generalized): Secondary | ICD-10-CM | POA: Diagnosis not present

## 2021-10-25 DIAGNOSIS — R252 Cramp and spasm: Secondary | ICD-10-CM | POA: Insufficient documentation

## 2021-10-25 NOTE — Therapy (Signed)
[place] [facility] [address] [place], [place], [address] Phone: [phone]   Fax:  [phone]  Physical Therapy Evaluation  Patient Details  Name: Barbara Rangel MRN: [id number] Date of Birth: [birth year] No data recorded  Encounter Date: 10/25/2021   PT End of Session - 10/25/21 1702     Visit Number 1    Date for PT Re-Evaluation 01/17/22    Authorization Type [place] - Visit Number 1    Authorization - Number of Visits 10    PT Start Time 1600    PT Stop Time 3382    PT Time Calculation (min) 55 min    Activity Tolerance Patient tolerated treatment well    Behavior During Therapy [facility] for tasks assessed/performed             Past Medical History:  Diagnosis Date   Arthritis    degenerative arthritis   Breast cancer, right breast ([place]) 04/2004   T2N1 diagnosed June 2005, ER PR + and Her 2 negative, post mastectomy with axillary node dissection (possibly 9 or 11 nodes removed, with possibly 7 or 9 involved) then treatment on NSABP B28 with dose dense adriamycin cytoxan followed by taxol, all of this Rx  in [place]. Adjuvant arimidex begun Jan 2006. No radiation.    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   Other and unspecified general anesthetics causing adverse effect in therapeutic use    hard to wake up   PONV (postoperative nausea and vomiting)    Syncope 09/15/2012    Past Surgical History:  Procedure Laterality Date   ANKLE SURGERY  2011   left ankle with screws and plates   BREAST LUMPECTOMY     x2   cataract surgery  2005   right eye   COLONOSCOPY     COLONOSCOPY WITH  PROPOFOL N/A 10/11/2014   Procedure: COLONOSCOPY WITH PROPOFOL;  Surgeon: Garlan Fair, MD;  Location: [facility];  Service: Endoscopy;  Laterality: N/A;   EYE SURGERY  2004   right eye-detached retina   left breast biopsy  2009   LUMBAR LAMINECTOMY/DECOMPRESSION MICRODISCECTOMY  10/31/2011   Procedure: LUMBAR LAMINECTOMY/DECOMPRESSION MICRODISCECTOMY;  Surgeon: Dahlia Bailiff;  Location: [facility];  Service: Orthopedics;  Laterality: Right;  L4-5 Decompression with Right Facet Decompression    MASTECTOMY  2005   right  d/t breast cancer;no sticks to right arm LYMPH NODES REMOVED.   RETINAL DETACHMENT SURGERY  2004   right breast biopsy  2005   TUBAL LIGATION  1980    There were no vitals filed for this visit.    Subjective Assessment - 10/25/21 1610     Subjective I have an overactive bladder and urine retention. I do intermitent catherization 2-5x per day. I have had a few UTI's. During the night I would wake up every hour to urinate. I will have a cystoscopy next week.    Patient Stated Goals goal is to empty her bladder fully    Currently in Pain? No/denies    Multiple Pain Sites No  [facility] PT Assessment - 10/25/21 0001       Assessment   Medical Diagnosis N33.9 Incomplete bladder emptying    Prior Therapy yes in the past      Precautions   Precautions Other (comment)    Precaution Comments breast cancer      Restrictions   Weight Bearing Restrictions No      Balance Screen   Has the patient fallen in the past 6 months No    Has the patient had a decrease in activity level because of a fear of falling?  No    Is the patient reluctant to leave their home because of a fear of falling?  No      Home Ecologist residence      Prior Function   Level of Independence Independent    Leisure when pandemic came she reduced her exercise      Cognition   Overall Cognitive Status Within Functional Limits for tasks assessed       Posture/Postural Control   Posture/Postural Control No significant limitations      ROM / Strength   AROM / PROM / Strength AROM;PROM;Strength      AROM   Overall AROM Comments lumbar ROM is full      Strength   Right Hip Flexion 4/5    Right Hip Extension 3+/5    Right Hip ABduction 3+/5    Left Hip Flexion 4/5    Left Hip Extension 3+/5    Left Hip ABduction 4/5      Palpation   Palpation comment decreased mobility of the lower rib cage and reduction of rib angle                        Objective measurements completed on examination: See above findings.     Pelvic Floor Special Questions - 10/25/21 0001     Urinary Leakage No    Urinary frequency catherization 2-5 times per day; stream is narrow    Fecal incontinence No    Falling out feeling (prolapse) No    Skin Integrity Other    Skin Integrity other dryness    Scar Restricted;Episiotomy    Prolapse None    Pelvic Floor Internal Exam Patietn confirms identification and approves PT to assess pelvic floor and treatment    Exam Type Vaginal    Palpation tightness in the perineal body, along the sides of the urethra and baldder    Strength weak squeeze, no lift              [facility] Adult PT Treatment/Exercise - 10/25/21 0001       Self-Care   Self-Care Other Self-Care Comments    Other Self-Care Comments  education on vaginal moisturizers      Neuro Re-ed    Neuro Re-ed Details  diaphragmatic breathing to relax the pelvic floor                     PT Education - 10/25/21 1700     Education Details Access Code: [email or web address]; education on vaginal moisturizers    Person(s) Educated Patient    Methods Explanation;Handout    Comprehension Verbalized understanding              PT Short Term Goals - 10/25/21 1709       PT SHORT TERM GOAL #1   Title ind with initial HEP    Baseline --  Time 4    Period Weeks    Status New    Target Date 11/22/21      PT SHORT TERM  GOAL #2   Title education on vaginal moisturizers to reduce dryness    Baseline ----    Time 4    Period Weeks    Status New    Target Date 11/22/21      PT SHORT TERM GOAL #3   Title ---    Baseline ---               PT Long Term Goals - 10/25/21 1710       PT LONG TERM GOAL #1   Title Pt will be ind with final HEP for elongation of muscles    Baseline ---    Time 12    Period Weeks    Status New    Target Date 01/17/22      PT LONG TERM GOAL #2   Title abiltiy to expand her lower rib cage for diaphragmatic breathing to elongate the pelvic floor    Baseline --    Time 12    Period Weeks    Status New    Target Date 01/17/22      PT LONG TERM GOAL #3   Title ability to fully empty her bladder so she is able to stop catherizing herself    Baseline --    Time 12    Period Weeks    Status New    Target Date 01/17/22      PT LONG TERM GOAL #4   Title pelvic floor strength >/= 3/5 due to the abilty to fully relax her pelvic floor then fully contract    Baseline --    Time 12    Period Weeks    Status New    Target Date 01/17/22                    Plan - 10/25/21 1702     Clinical Impression Statement Patient is a 75 year old female with incomplete bladder emptying for several months. She has to catherize 2-5x per day. She reports her urine stream is thinner. Patient vaginal area is dry. Her pelvic floor strength is 2/5. She has fascial restrictions on the sides of the urethra, bladder and urethra sphincter. She has tightness in the perineal body and sides of the introitus. Patient has difficulty with perfromins diaphragmatic breathing to relax the pelvic floor. Her rib cage angle is less than 90 degrees. Bilateral hip abduction and extension strength is 3+/5. Patient will benefit from skilled therapy to improve elongation of pelvic floor muscles to fully empty her bladder.    Personal Factors and Comorbidities Comorbidity 3+    Comorbidities Right  breast cancer 04/2004; Mastectomy 2005; Lumbar laminectomy 10/2011; Glaucoma; Osteopenia    Examination-Activity Limitations Continence    Stability/Clinical Decision Making Stable/Uncomplicated    Clinical Decision Making Low    Rehab Potential Excellent    PT Frequency 1x / week    PT Duration 12 weeks    PT Treatment/Interventions Biofeedback;Therapeutic activities;Therapeutic exercise;Neuromuscular re-education;Patient/family education;Manual techniques;Dry needling;Taping    PT Next Visit Plan hip adductor stretch, happy baby, lower rib cage expansion, manual work on the perineal body and urethra and bladder; work on diaphragmatic breathing; dry needling to the hip adductors    PT Home Exercise Plan Access Code: [email or web address]    Consulted and Agree with Plan of Care Patient  Patient will benefit from skilled therapeutic intervention in order to improve the following deficits and impairments:  Increased fascial restricitons, Decreased strength, Decreased coordination  Visit Diagnosis: Cramp and spasm - Plan: PT plan of care cert/re-cert  Muscle weakness (generalized) - Plan: PT plan of care cert/re-cert     Problem List Patient Active Problem List   Diagnosis Date Noted   Carotid artery stenosis 01/05/2020   Neck pain 03/30/2018   Osteopenia determined by x-ray 08/28/2016   History of right breast cancer 08/28/2016   Malignant neoplasm of right female breast ([place]) 01/23/2016   History of chemotherapy 01/23/2016   Estrogen deficiency 07/29/2015   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/24/2015   Dense breast tissue 01/24/2015   Hx of adenomatous colonic polyps 01/24/2015   Plantar fasciitis, bilateral 01/24/2015   Breast cancer, right breast ([place]) 07/15/2013   Syncope 09/15/2012   Hypokalemia 09/15/2012   Chronic back pain    Chronic urinary tract infection    Hypertension    Arthritis    Synovial cyst of lumbar facet joint 11/01/2011    Earlie Counts,  PT 10/25/21 5:14 PM  [place] [facility] [address] [place], [place], [address] Phone: [phone]   Fax:  [phone]  Name: Barbara Rangel MRN: [id number] Date of Birth: 14-May-1946

## 2021-10-25 NOTE — Patient Instructions (Addendum)
Moisturizers They are used in the vagina to hydrate the mucous membrane that make up the vaginal canal. Designed to keep a more normal acid balance (ph) Once placed in the vagina, it will last between two to three days.  Use 2-3 times per week at bedtime  Ingredients to avoid is glycerin and fragrance, can increase chance of infection Should not be used just before sex due to causing irritation Most are gels administered either in a tampon-shaped applicator or as a vaginal suppository. They are non-hormonal.   Types of Moisturizers(internal use)  Vitamin E vaginal suppositories- Whole foods, Amazon Moist Again Coconut oil- can break down condoms Julva- (Do no use if on Tamoxifen) amazon Yes moisturizer- amazon NeuEve Silk , NeuEve Silver for menopausal or over 65 (if have severe vaginal atrophy or cancer treatments use NeuEve Silk for  1 month than move to [employer])- [employer], [place].com Olive and Bee intimate cream- www.oliveandbee.com.au Mae vaginal moisturizer- Amazon Aloe    Creams to use externally on the Vulva area Alber[employer]on's (good for for cancer patients that had radiation to the area)- [place] or [employer].FlyingBasics.com.br V-magic cream - amazon Julva-amazon Vital "V Wild Yam salve ( help moisturize and help with thinning vulvar area, does have [place] by Irwin Brakeman labial moisturizer (Amazon,  Coconut or olive oil aloe   Things to avoid in the vaginal area Do not use things to irritate the vulvar area No lotions just specialized creams for the vulva area- Neogyn, V-magic, No soaps; can use Aveeno or Calendula cleanser if needed. Must be gentle No deodorants No douches Good to sleep without underwear to let the vaginal area to air out No scrubbing: spread the lips to let warm water rinse over labias and pat dry   Access Code: [email or web address] URL: [email or web address] Date:  10/25/2021 Prepared by: Earlie Counts  Exercises Supine Diaphragmatic Breathing - 2 x daily - 7 x weekly - 1 sets - 10 reps   Earlie Counts, PT [facility] [address], [place], [place] [address] W: [phone] [email or web address]

## 2021-11-01 ENCOUNTER — Other Ambulatory Visit: Payer: Medicare Other | Admitting: Obstetrics and Gynecology

## 2021-11-01 ENCOUNTER — Encounter: Payer: Medicare Other | Admitting: Physical Therapy

## 2021-11-06 ENCOUNTER — Other Ambulatory Visit: Payer: Self-pay

## 2021-11-06 ENCOUNTER — Ambulatory Visit: Payer: Medicare Other | Admitting: Physical Therapy

## 2021-11-06 ENCOUNTER — Encounter: Payer: Self-pay | Admitting: Physical Therapy

## 2021-11-06 DIAGNOSIS — R252 Cramp and spasm: Secondary | ICD-10-CM

## 2021-11-06 DIAGNOSIS — M6281 Muscle weakness (generalized): Secondary | ICD-10-CM | POA: Diagnosis not present

## 2021-11-06 NOTE — Patient Instructions (Signed)
Access Code: [email or web address] URL: [email or web address] Date: 11/06/2021 Prepared by: Earlie Counts  Program Notes sit on a ball and massage the pelvic floor muscles 10x each side 1 time per day    Exercises Supine Diaphragmatic Breathing - 2 x daily - 7 x weekly - 1 sets - 10 reps V Sit Hip Adductor Hamstring Stretch - 1 x daily - 7 x weekly - 1 sets - 2 reps - 30 sec hold Hooklying Active Hamstring Stretch - 1 x daily - 7 x weekly - 1 sets - 2 reps - 30 sec hold Supine Figure 4 Piriformis Stretch with Leg Extension - 1 x daily - 7 x weekly - 1 sets - 2 reps - 30 sec hold Sidelying Diaphragmatic Breathing - 2 x daily - 7 x weekly - 1 sets - 10 reps  Patient Education Trigger Point Dry Needling  Earlie Counts, PT [facility] [address], [place], [place] [address] W: [phone] [email or web address]

## 2021-11-06 NOTE — Therapy (Signed)
[place] at [facility] [address], [place], [place], [address] Phone: [phone]   Fax:  [phone]  Physical Therapy Treatment  Patient Details  Name: Barbara Rangel MRN: [id number] Date of Birth: 10-01-1946 No data recorded  Encounter Date: 11/06/2021   PT End of Session - 11/06/21 1449     Visit Number 2    Date for PT Re-Evaluation 01/17/22    Authorization Type [place] - Visit Number 2    Authorization - Number of Visits 10    PT Start Time 1400    PT Stop Time 1450    PT Time Calculation (min) 50 min    Activity Tolerance Patient tolerated treatment well    Behavior During Therapy [facility] for tasks assessed/performed             Past Medical History:  Diagnosis Date   Arthritis    degenerative arthritis   Breast cancer, right breast ([place]) 04/2004   T2N1 diagnosed June 2005, ER PR + and Her 2 negative, post mastectomy with axillary node dissection (possibly 9 or 11 nodes removed, with possibly 7 or 9 involved) then treatment on NSABP B28 with dose dense adriamycin cytoxan followed by taxol, all of this Rx  in [place]. Adjuvant arimidex begun Jan 2006. No radiation.    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   Other and unspecified general anesthetics causing adverse effect in therapeutic use    hard to wake up   PONV (postoperative nausea and vomiting)    Syncope 09/15/2012    Past Surgical History:  Procedure Laterality Date   ANKLE SURGERY  2011   left ankle with screws and plates   BREAST LUMPECTOMY     x2   cataract surgery  2005   right eye   COLONOSCOPY     COLONOSCOPY WITH  PROPOFOL N/A 10/11/2014   Procedure: COLONOSCOPY WITH PROPOFOL;  Surgeon: Garlan Fair, MD;  Location: [facility];  Service: Endoscopy;  Laterality: N/A;   EYE SURGERY  2004   right eye-detached retina   left breast biopsy  2009   LUMBAR LAMINECTOMY/DECOMPRESSION MICRODISCECTOMY  10/31/2011   Procedure: LUMBAR LAMINECTOMY/DECOMPRESSION MICRODISCECTOMY;  Surgeon: Dahlia Bailiff;  Location: [place];  Service: Orthopedics;  Laterality: Right;  L4-5 Decompression with Right Facet Decompression    MASTECTOMY  2005   right  d/t breast cancer;no sticks to right arm LYMPH NODES REMOVED.   RETINAL DETACHMENT SURGERY  2004   right breast biopsy  2005   TUBAL LIGATION  1980    There were no vitals filed for this visit.   Subjective Assessment - 11/06/21 1409     Subjective I have not had the cystoscopy yet.    Patient Stated Goals goal is to empty her bladder fully    Currently in Pain? No/denies    Multiple Pain Sites No                               [facility] Adult PT Treatment/Exercise - 11/06/21 0001  Self-Care   Self-Care Other Self-Care Comments    Other Self-Care Comments  educated patient on dry needling to the hip adductors to lengthen the muscles. She will be thinking about it. Sit on a ball and massage the pelvic floor muscles,      Therapeutic Activites    Therapeutic Activities Other Therapeutic Activities    Other Therapeutic Activities educated pateint on double voiding and performing pelvic circles and tilts to relax the pelvic floor      Neuro Re-ed    Neuro Re-ed Details  diaphragmatic breathing in supine and sidely with tactile cues to feel the rib cage open up      Lumbar Exercises: Stretches   Active Hamstring Stretch Right;Left;1 rep;30 seconds    Active Hamstring Stretch Limitations supine    Piriformis Stretch Right;Left;1 rep;30 seconds    Piriformis Stretch Limitations supine    Other Lumbar Stretch Exercise hip adductor stretch in  sitting with legs apart holding for 30 seconds                     PT Education - 11/06/21 1449     Education Details Access Code: [email or web address]; education on toileting    Person(s) Educated Patient    Methods Explanation;Demonstration;Verbal cues;Handout    Comprehension Returned demonstration;Verbalized understanding              PT Short Term Goals - 10/25/21 1709       PT SHORT TERM GOAL #1   Title ind with initial HEP    Baseline --    Time 4    Period Weeks    Status New    Target Date 11/22/21      PT SHORT TERM GOAL #2   Title education on vaginal moisturizers to reduce dryness    Baseline ----    Time 4    Period Weeks    Status New    Target Date 11/22/21      PT SHORT TERM GOAL #3   Title ---    Baseline ---               PT Long Term Goals - 10/25/21 1710       PT LONG TERM GOAL #1   Title Pt will be ind with final HEP for elongation of muscles    Baseline ---    Time 12    Period Weeks    Status New    Target Date 01/17/22      PT LONG TERM GOAL #2   Title abiltiy to expand her lower rib cage for diaphragmatic breathing to elongate the pelvic floor    Baseline --    Time 12    Period Weeks    Status New    Target Date 01/17/22      PT LONG TERM GOAL #3   Title ability to fully empty her bladder so she is able to stop catherizing herself    Baseline --    Time 12    Period Weeks    Status New    Target Date 01/17/22      PT LONG TERM GOAL #4   Title pelvic floor strength >/= 3/5 due to the abilty to fully relax her pelvic floor then fully contract    Baseline --    Time 12    Period Weeks    Status New    Target Date 01/17/22  Plan - 11/06/21 1450     Clinical Impression Statement Patient needed many verbal and tactile cues to work on 360 degree expansion of the lower rib cage to relax the pelvic floor. She was educated on dry needling and will think about it for next visit. She was  educated on double voiding to assist in emptying her bladder. Patient will benefit from skilled therapy to improve elongation of pelvic floor muscles to fully empty her bladder.    Personal Factors and Comorbidities Comorbidity 3+    Comorbidities Right breast cancer 04/2004; Mastectomy 2005; Lumbar laminectomy 10/2011; Glaucoma; Osteopenia    Examination-Activity Limitations Continence    Stability/Clinical Decision Making Stable/Uncomplicated    Rehab Potential Excellent    PT Frequency 1x / week    PT Duration 12 weeks    PT Treatment/Interventions Biofeedback;Therapeutic activities;Therapeutic exercise;Neuromuscular re-education;Patient/family education;Manual techniques;Dry needling;Taping    PT Next Visit Plan see if we can do dry needling to hip adductors, see if her HEP is doing well. manual work to tthe perineal body and pelvic floor, urogenital release; check on vaginal moisturizers    PT Home Exercise Plan Access Code: [email or web address]    Recommended Other Services MD signed initial summary    Consulted and Agree with Plan of Care Patient             Patient will benefit from skilled therapeutic intervention in order to improve the following deficits and impairments:  Increased fascial restricitons, Decreased strength, Decreased coordination  Visit Diagnosis: Cramp and spasm  Muscle weakness (generalized)     Problem List Patient Active Problem List   Diagnosis Date Noted   Carotid artery stenosis 01/05/2020   Neck pain 03/30/2018   Osteopenia determined by x-ray 08/28/2016   History of right breast cancer 08/28/2016   Malignant neoplasm of right female breast ([place]) 01/23/2016   History of chemotherapy 01/23/2016   Estrogen deficiency 07/29/2015   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/24/2015   Dense breast tissue 01/24/2015   Hx of adenomatous colonic polyps 01/24/2015   Plantar fasciitis, bilateral 01/24/2015   Breast cancer, right breast ([place]) 07/15/2013   Syncope  09/15/2012   Hypokalemia 09/15/2012   Chronic back pain    Chronic urinary tract infection    Hypertension    Arthritis    Synovial cyst of lumbar facet joint 11/01/2011    Earlie Counts, PT 11/06/21 2:54 PM  [place] at [place] [address], [place], [place], [address] Phone: [phone]   Fax:  [phone]  Name: Barbara Rangel MRN: [id number] Date of Birth: 1946-03-29

## 2021-11-13 DIAGNOSIS — H401131 Primary open-angle glaucoma, bilateral, mild stage: Secondary | ICD-10-CM | POA: Diagnosis not present

## 2021-11-15 ENCOUNTER — Other Ambulatory Visit: Payer: Self-pay

## 2021-11-15 ENCOUNTER — Encounter: Payer: Self-pay | Admitting: Physical Therapy

## 2021-11-15 ENCOUNTER — Encounter: Payer: Medicare Other | Attending: Obstetrics and Gynecology | Admitting: Physical Therapy

## 2021-11-15 ENCOUNTER — Other Ambulatory Visit: Payer: Medicare Other | Admitting: Obstetrics and Gynecology

## 2021-11-15 ENCOUNTER — Ambulatory Visit: Payer: Medicare Other | Admitting: Sports Medicine

## 2021-11-15 DIAGNOSIS — R339 Retention of urine, unspecified: Secondary | ICD-10-CM | POA: Insufficient documentation

## 2021-11-15 DIAGNOSIS — R252 Cramp and spasm: Secondary | ICD-10-CM

## 2021-11-15 DIAGNOSIS — R102 Pelvic and perineal pain: Secondary | ICD-10-CM | POA: Insufficient documentation

## 2021-11-15 DIAGNOSIS — M6281 Muscle weakness (generalized): Secondary | ICD-10-CM | POA: Insufficient documentation

## 2021-11-15 NOTE — Patient Instructions (Signed)
Access Code: [email or web address] URL: [email or web address] Date: 11/15/2021 Prepared by: Earlie Counts  Program Notes sit on a ball and massage the pelvic floor muscles 10x each side 1 time per day    Exercises Supine Diaphragmatic Breathing - 2 x daily - 7 x weekly - 1 sets - 10 reps V Sit Hip Adductor Hamstring Stretch - 1 x daily - 7 x weekly - 1 sets - 2 reps - 30 sec hold Hooklying Active Hamstring Stretch - 1 x daily - 7 x weekly - 1 sets - 2 reps - 30 sec hold Sidelying Diaphragmatic Breathing - 2 x daily - 7 x weekly - 1 sets - 10 reps Seated Piriformis Stretch with Trunk Bend - 1 x daily - 7 x weekly - 1 sets - 2 reps - 30 sec hold  Patient Education Trigger Point Dry Needling Earlie Counts, PT [facility] [address] [place], [place] [address] W: [phone] [email or web address]

## 2021-11-15 NOTE — Therapy (Signed)
[place] at [facility] [address] [place], [place], [address] Phone: [phone]   Fax:  [phone]  Physical Therapy Treatment  Patient Details  Name: Barbara Rangel MRN: [id number] Date of Birth: 01/07/1946 No data recorded  Encounter Date: 11/15/2021   PT End of Session - 11/15/21 1355     Visit Number 3    Date for PT Re-Evaluation 01/17/22    Authorization Type [place] - Visit Number 3    Authorization - Number of Visits 10    PT Start Time 1400    PT Stop Time 0350    PT Time Calculation (min) 55 min    Activity Tolerance Patient tolerated treatment well    Behavior During Therapy [facility] for tasks assessed/performed             Past Medical History:  Diagnosis Date   Arthritis    degenerative arthritis   Breast cancer, right breast ([place]) 04/2004   T2N1 diagnosed June 2005, ER PR + and Her 2 negative, post mastectomy with axillary node dissection (possibly 9 or 11 nodes removed, with possibly 7 or 9 involved) then treatment on NSABP B28 with dose dense adriamycin cytoxan followed by taxol, all of this Rx  in [place]. Adjuvant arimidex begun Jan 2006. No radiation.    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   Other and unspecified general anesthetics causing adverse effect in therapeutic use    hard to wake up   PONV (postoperative nausea and vomiting)    Syncope 09/15/2012    Past Surgical History:  Procedure Laterality Date   ANKLE SURGERY  2011   left ankle with screws and plates   BREAST LUMPECTOMY     x2   cataract surgery  2005   right eye   COLONOSCOPY     COLONOSCOPY WITH PROPOFOL  N/A 10/11/2014   Procedure: COLONOSCOPY WITH PROPOFOL;  Surgeon: Garlan Fair, MD;  [facility];  Service: Endoscopy;  Laterality: N/A;   EYE SURGERY  2004   right eye-detached retina   left breast biopsy  2009   LUMBAR LAMINECTOMY/DECOMPRESSION MICRODISCECTOMY  10/31/2011   Procedure: LUMBAR LAMINECTOMY/DECOMPRESSION MICRODISCECTOMY;  Surgeon: Dahlia Bailiff;  Location: [place];  Service: Orthopedics;  Laterality: Right;  L4-5 Decompression with Right Facet Decompression    MASTECTOMY  2005   right  d/t breast cancer;no sticks to right arm LYMPH NODES REMOVED.   RETINAL DETACHMENT SURGERY  2004   right breast biopsy  2005   TUBAL LIGATION  1980    There were no vitals filed for this visit.   Subjective Assessment - 11/15/21 1307     Subjective I still catherize the same amount of urine. I was suppose to have the cystoscopy today but was cancelled.  I catherize myself 3-5 times per day.    Patient Stated Goals goal is to empty her bladder fully    Currently in Pain? No/denies    Multiple Pain Sites No  [place] Adult PT Treatment/Exercise - 11/15/21 0001       Self-Care   Self-Care Other Self-Care Comments    Other Self-Care Comments  discussed the use of her vaginal moisturizers and she is using them regularly      Lumbar Exercises: Stretches   Active Hamstring Stretch Right;Left;1 rep;30 seconds    Active Hamstring Stretch Limitations supine    Piriformis Stretch Right;Left;1 rep;30 seconds    Piriformis Stretch Limitations sitting; doing it in supine was difficult    Other Lumbar Stretch Exercise hip adductor stretch in sitting with legs apart holding for 30 seconds      Manual Therapy   Manual Therapy Internal Pelvic Floor;Myofascial release    Myofascial Release release of the urogenital diaphragm going through the restricted layers    Internal Pelvic Floor manuwl work on the levator ani, release of the urethrea  and sides of her bladder                     PT Education - 11/15/21 1355     Education Details Access Code: [email or web address]    Person(s) Educated Patient    Methods Explanation;Demonstration;Verbal cues;Handout    Comprehension Returned demonstration;Verbalized understanding              PT Short Term Goals - 11/15/21 1400       PT SHORT TERM GOAL #1   Title ind with initial HEP    Time 4    Period Weeks    Status Achieved      PT SHORT TERM GOAL #2   Title education on vaginal moisturizers to reduce dryness    Time 4    Period Weeks    Status Achieved               PT Long Term Goals - 10/25/21 1710       PT LONG TERM GOAL #1   Title Pt will be ind with final HEP for elongation of muscles    Baseline ---    Time 12    Period Weeks    Status New    Target Date 01/17/22      PT LONG TERM GOAL #2   Title abiltiy to expand her lower rib cage for diaphragmatic breathing to elongate the pelvic floor    Baseline --    Time 12    Period Weeks    Status New    Target Date 01/17/22      PT LONG TERM GOAL #3   Title ability to fully empty her bladder so she is able to stop catherizing herself    Baseline --    Time 12    Period Weeks    Status New    Target Date 01/17/22      PT LONG TERM GOAL #4   Title pelvic floor strength >/= 3/5 due to the abilty to fully relax her pelvic floor then fully contract    Baseline --    Time 12    Period Weeks    Status New    Target Date 01/17/22                   Plan - 11/15/21 1357     Clinical Impression Statement Patient is using her vaginal moisturizers. She is caterizing the same amount of urine per day. Patient urogenital diaphragm has relaxed after the manual work.. She has restrictions ont he righ tside of the bladder. ther uretral restrictions  reduced with manual work. Patient was able to urinate after the therapy session. Patient will benefit from skilled therapy to improve elongation of  pelvic floor muscles to fully empty her bladder.    Personal Factors and Comorbidities Comorbidity 3+    Comorbidities Right breast cancer 04/2004; Mastectomy 2005; Lumbar laminectomy 10/2011; Glaucoma; Osteopenia    Examination-Activity Limitations Continence    Stability/Clinical Decision Making Stable/Uncomplicated    Rehab Potential Excellent    PT Frequency 1x / week    PT Duration 12 weeks    PT Treatment/Interventions Biofeedback;Therapeutic activities;Therapeutic exercise;Neuromuscular re-education;Patient/family education;Manual techniques;Dry needling;Taping    PT Next Visit Plan see if we can do dry needling to hip adductors,manual work to the pelvic floor, urogenital release, and righ tside of bladder; toileting to release the urine better    PT Home Exercise Plan Access Code: [email or web address]    Consulted and Agree with Plan of Care Patient             Patient will benefit from skilled therapeutic intervention in order to improve the following deficits and impairments:  Increased fascial restricitons, Decreased strength, Decreased coordination  Visit Diagnosis: Cramp and spasm  Muscle weakness (generalized)     Problem List Patient Active Problem List   Diagnosis Date Noted   Carotid artery stenosis 01/05/2020   Neck pain 03/30/2018   Osteopenia determined by x-ray 08/28/2016   History of right breast cancer 08/28/2016   Malignant neoplasm of right female breast ([place]) 01/23/2016   History of chemotherapy 01/23/2016   Estrogen deficiency 07/29/2015   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/24/2015   Dense breast tissue 01/24/2015   Hx of adenomatous colonic polyps 01/24/2015   Plantar fasciitis, bilateral 01/24/2015   Breast cancer, right breast ([place]) 07/15/2013   Syncope 09/15/2012   Hypokalemia 09/15/2012   Chronic back pain    Chronic urinary tract infection    Hypertension    Arthritis    Synovial cyst of lumbar facet joint 11/01/2011    Earlie Counts,  PT 11/15/21 2:02 PM  [facility] [place] [facility] [address] [place], [place], [address] Phone: [phone]   Fax:  [phone]  Name: Barbara Rangel MRN: [id number] Date of Birth: 07-18-1946

## 2021-11-22 ENCOUNTER — Ambulatory Visit: Payer: Medicare Other | Admitting: Obstetrics and Gynecology

## 2021-11-22 ENCOUNTER — Encounter: Payer: Medicare Other | Admitting: Physical Therapy

## 2021-11-22 ENCOUNTER — Other Ambulatory Visit: Payer: Self-pay

## 2021-11-22 ENCOUNTER — Encounter: Payer: Self-pay | Admitting: Obstetrics and Gynecology

## 2021-11-22 ENCOUNTER — Encounter: Payer: Self-pay | Admitting: Physical Therapy

## 2021-11-22 VITALS — BP 148/87 | HR 71

## 2021-11-22 DIAGNOSIS — R102 Pelvic and perineal pain: Secondary | ICD-10-CM | POA: Diagnosis not present

## 2021-11-22 DIAGNOSIS — R339 Retention of urine, unspecified: Secondary | ICD-10-CM | POA: Diagnosis not present

## 2021-11-22 DIAGNOSIS — R350 Frequency of micturition: Secondary | ICD-10-CM

## 2021-11-22 DIAGNOSIS — M6281 Muscle weakness (generalized): Secondary | ICD-10-CM | POA: Diagnosis not present

## 2021-11-22 DIAGNOSIS — R252 Cramp and spasm: Secondary | ICD-10-CM

## 2021-11-22 LAB — POCT URINALYSIS DIPSTICK
Appearance: NORMAL
Bilirubin, UA: NEGATIVE
Glucose, UA: NEGATIVE
Ketones, UA: NEGATIVE
Leukocytes, UA: NEGATIVE
Nitrite, UA: NEGATIVE
Protein, UA: NEGATIVE
Spec Grav, UA: 1.02 (ref 1.010–1.025)
Urobilinogen, UA: 0.2 E.U./dL
pH, UA: 6.5 (ref 5.0–8.0)

## 2021-11-22 NOTE — Patient Instructions (Signed)
Taking Care of Yourself after Urodynamics, Cystoscopy   Drink plenty of water for a day or two following your procedure. Try to have about 8 ounces (one cup) at a time, and do this 6 times or more per day unless you have fluid restrictitons AVOID irritative beverages such as coffee, tea, soda, alcoholic or citrus drinks for a day or two, as this may cause burning with urination.  For the first 1-2 days after the procedure, your urine may be pink or red in color. You may have some blood in your urine as a normal side effect of the procedure. Large amounts of bleeding or difficulty urinating are NOT normal. Call the nurse line if this happens or go to the nearest Emergency Room if the bleeding is heavy or you cannot urinate at all and it is after hours.  You may experience some discomfort or a burning sensation with urination after having this procedure. You can use over the counter Azo or pyridium to help with burning and follow the instructions on the packaging. If it does not improve within 1-2 days, or other symptoms appear (fever, chills, or difficulty urinating) call the office to speak to a nurse.  You may return to normal daily activities such as work, school, driving, exercising and housework on the day of the procedure. If your doctor gave you a prescription, take it as ordered.  If you need a return appointment, the front desk staff will arrange it when you check out.

## 2021-11-22 NOTE — Progress Notes (Signed)
Barbara Rangel is a 76 y.o. female here for a cystoscopy. Pt was prepped with Betadine 84fr cath was place into the bladder with lubrication. 200cc was emptied from the bladder. Urojet used to prep pt for Cystoscopy I

## 2021-11-22 NOTE — Progress Notes (Signed)
CYSTOSCOPY  CC:  This is a 76 y.o. with  incomplete bladder emptying  who presents today for cystoscopy.  POC urine: trace blood, otherwise negative  BP (!) 148/87    Pulse 71   CYSTOSCOPY: A time out was performed.  The periurethral area was prepped and draped in a sterile manner.  2% lidocaine jetpack was inserted at the urethral meatus and the urethra and bladder visualized with 0- and 70-degree scopes.  She had normal urethral coaptation and normal urethral mucosa.  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 Some trabeculations were noted. She had bilateral clear efflux from both ureteral orifices.  She had no squamous metaplasia at the trigone, cellules or diverticuli.     ASSESSMENT:  76 y.o. with  incomplete bladder emptying . Cystoscopy today is normal.  PLAN:  Continue with pelvic PT and self-catheterization 4-5 times per day. She has been recording her cath volumes and has noticed some improvement with her exercises.  All questions answered and post-procedures instructions were given.  Follow up 3 months  [name], MD

## 2021-11-22 NOTE — Patient Instructions (Addendum)
Double Voiding can be a very useful technique to help overcome incomplete emptying of your bladder.  Incomplete emptying of urine can result in leakage after using the bathroom and increase the risk of urinary tract infection.   Initial Void: When you first sit down to urinate, ensure optimal positioning for bladder emptying by following these guidelines for toileting posture: Sit on the toilet seat - dont hover over the seat Support your trunk by placing your hands on your knees or thighs Spread your knees and hips wide Position your feet flat on the floor or elevate feet on phone books, foot stool (Squatty Potty), or wrapped toilet paper rolls (if having knees above hips helps you empty) Lean forward from your hips Maintain the normal inward curve in your lower back   Repeated Void: After your initial void is complete, follow these movement patterns and attempt going to the bathroom again. Stand up Rotate your hips as if doing hula hoop in one direction Rotate using the same action in the other direction Rock your hips and pelvis back and forwards ("pelvic tilts") Rock your hips and pelvis side to side ("tail wag") Sit back down and repeat your voiding technique This technique can be repeated as many times as you choose to help you empty your bladder more effectively.   Earlie Counts, PT [facility] [address], [place], [place] [address] W: [phone] [email or web address]

## 2021-11-22 NOTE — Therapy (Signed)
[place] at [facility] [address] [place], [place], [address] Phone: [phone]   Fax:  [phone]  Physical Therapy Treatment  Patient Details  Name: Barbara Rangel MRN: [id number] Date of Birth: [birth year] No data recorded  Encounter Date: 11/22/2021   PT End of Session - 11/22/21 1344     Visit Number 4    Date for PT Re-Evaluation 01/17/22    Authorization Type [place] - Visit Number 4    Authorization - Number of Visits 10    PT Start Time 1400    PT Stop Time 1450    PT Time Calculation (min) 50 min    Activity Tolerance Patient tolerated treatment well    Behavior During Therapy [facility] for tasks assessed/performed             Past Medical History:  Diagnosis Date   Arthritis    degenerative arthritis   Breast cancer, right breast ([place]) 04/2004   T2N1 diagnosed June 2005, ER PR + and Her 2 negative, post mastectomy with axillary node dissection (possibly 9 or 11 nodes removed, with possibly 7 or 9 involved) then treatment on NSABP B28 with dose dense adriamycin cytoxan followed by taxol, all of this Rx  in [place]. Adjuvant arimidex begun Jan 2006. No radiation.    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   Other and unspecified general anesthetics causing adverse effect in therapeutic use    hard to wake up   PONV (postoperative nausea and vomiting)    Syncope 09/15/2012    Past Surgical History:  Procedure Laterality Date   ANKLE SURGERY  2011   left ankle with screws and plates   BREAST LUMPECTOMY     x2   cataract surgery  2005   right eye   COLONOSCOPY     COLONOSCOPY WITH  PROPOFOL N/A 10/11/2014   Procedure: COLONOSCOPY WITH PROPOFOL;  Surgeon: Garlan Fair, MD;  Location: [facility];  Service: Endoscopy;  Laterality: N/A;   EYE SURGERY  2004   right eye-detached retina   left breast biopsy  2009   LUMBAR LAMINECTOMY/DECOMPRESSION MICRODISCECTOMY  10/31/2011   Procedure: LUMBAR LAMINECTOMY/DECOMPRESSION MICRODISCECTOMY;  Surgeon: Dahlia Bailiff;  Location: [place];  Service: Orthopedics;  Laterality: Right;  L4-5 Decompression with Right Facet Decompression    MASTECTOMY  2005   right  d/t breast cancer;no sticks to right arm LYMPH NODES REMOVED.   RETINAL DETACHMENT SURGERY  2004   right breast biopsy  2005   TUBAL LIGATION  1980    There were no vitals filed for this visit.   Subjective Assessment - 11/22/21 1308     Subjective Sometimes I may not go for 8 hours and not get the urge to urinate. If I exercise that will help me get the urge. I drink 4 16oz. bottles of water. sometimes she is able to feel more urine come out. she is able to urinate 4-6 ounces fo urine at a time.    Patient Stated Goals goal is to empty her bladder fully    Currently in Pain? No/denies  Multiple Pain Sites No                            Pelvic Floor Special Questions - 11/22/21 0001     Pelvic Floor Internal Exam Patietn confirms identification and approves PT to assess pelvic floor and treatment    Exam Type Vaginal               [facility] Adult PT Treatment/Exercise - 11/22/21 0001       Self-Care   Self-Care Other Self-Care Comments    Other Self-Care Comments  sit on ball to massage the pelvic floor, educated patient on how much urine the bladder needs to feel the urge and what is abnormal amount of urine after urinating;      Therapeutic Activites    Therapeutic Activities Other Therapeutic Activities    Other Therapeutic Activities educated pateint on correct toileting technique for double voiding to assist in fully empting her  bladder with hip movements and breath, using a step stool and knees apart      Manual Therapy   Manual Therapy Myofascial release    Myofascial Release release of the urogenital diaphragm going through the restricted layers and patient performins the diaphragmatic breathing    Internal Pelvic Floor manual work to the perineal body, superior transeverse perineum, along the sides of the urethra and bladder                     PT Education - 11/22/21 1322     Education Details how to double void urine ;education on how much urine in bladder to start the urge to void and what is not a good amount to have in the bladder after urination.    Person(s) Educated Patient    Methods Explanation;Handout    Comprehension Verbalized understanding              PT Short Term Goals - 11/15/21 1400       PT SHORT TERM GOAL #1   Title ind with initial HEP    Time 4    Period Weeks    Status Achieved      PT SHORT TERM GOAL #2   Title education on vaginal moisturizers to reduce dryness    Time 4    Period Weeks    Status Achieved               PT Long Term Goals - 11/22/21 1349       PT LONG TERM GOAL #1   Title Pt will be ind with final HEP for elongation of muscles    Time 12    Period Weeks    Status On-going    Target Date 01/17/22      PT LONG TERM GOAL #2   Title abiltiy to expand her lower rib cage for diaphragmatic breathing to elongate the pelvic floor    Time 12    Period Weeks    Status On-going    Target Date 01/17/22      PT LONG TERM GOAL #3   Title ability to fully empty her bladder so she is able to stop catherizing herself    Time 12    Period Weeks    Status On-going      PT LONG TERM GOAL #4   Title pelvic floor strength >/= 3/5 due to the abilty to fully relax her pelvic floor then fully contract  Time 12    Period Weeks    Status On-going                   Plan - 11/22/21 1345     Clinical Impression Statement Patient  is able to empty her bladder between 4-6 ounces more consistently. She sometimes does not have the urge to urinate up to 8 hours. Patient understands how to double void to work on emptying her bladder better. She is able to perform diaphragmatic breathing correctly due to the therapist feeling the pelvic floor elongate downward. Patient is doing her HEP. Patient will benefit from skilled therapy to improve elongation of pelvic floor muscles to fully empty the bladder.    Personal Factors and Comorbidities Comorbidity 3+    Comorbidities Right breast cancer 04/2004; Mastectomy 2005; Lumbar laminectomy 10/2011; Glaucoma; Osteopenia    Examination-Activity Limitations Continence    Stability/Clinical Decision Making Stable/Uncomplicated    Rehab Potential Excellent    PT Frequency 1x / week    PT Duration 12 weeks    PT Treatment/Interventions Biofeedback;Therapeutic activities;Therapeutic exercise;Neuromuscular re-education;Patient/family education;Manual techniques;Dry needling;Taping    PT Next Visit Plan manual work to the pelvic floor, urogenital release, and right side of bladder; see how Dr. Wannetta Sender appointment went    PT Home Exercise Plan Access Code: [facility]    Consulted and Agree with Plan of Care Patient             Patient will benefit from skilled therapeutic intervention in order to improve the following deficits and impairments:  Increased fascial restricitons, Decreased strength, Decreased coordination  Visit Diagnosis: Cramp and spasm  Muscle weakness (generalized)     Problem List Patient Active Problem List   Diagnosis Date Noted   Carotid artery stenosis 01/05/2020   Neck pain 03/30/2018   Osteopenia determined by x-ray 08/28/2016   History of right breast cancer 08/28/2016   Malignant neoplasm of right female breast ([place]) 01/23/2016   History of chemotherapy 01/23/2016   Estrogen deficiency 07/29/2015   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/24/2015   Dense  breast tissue 01/24/2015   Hx of adenomatous colonic polyps 01/24/2015   Plantar fasciitis, bilateral 01/24/2015   Breast cancer, right breast ([place]) 07/15/2013   Syncope 09/15/2012   Hypokalemia 09/15/2012   Chronic back pain    Chronic urinary tract infection    Hypertension    Arthritis    Synovial cyst of lumbar facet joint 11/01/2011    Earlie Counts, PT 11/22/21 1:51 PM  [facility] at [facility] [address] [place], [place], [address] Phone: [phone]   Fax:  [phone]  Name: Barbara Rangel MRN: [id number] Date of Birth: 25-Jul-1946

## 2021-11-26 DIAGNOSIS — R339 Retention of urine, unspecified: Secondary | ICD-10-CM | POA: Diagnosis not present

## 2021-11-27 ENCOUNTER — Encounter: Payer: Medicare Other | Admitting: Physical Therapy

## 2021-11-27 ENCOUNTER — Encounter: Payer: Self-pay | Admitting: Physical Therapy

## 2021-11-27 ENCOUNTER — Other Ambulatory Visit: Payer: Self-pay

## 2021-11-27 DIAGNOSIS — M6281 Muscle weakness (generalized): Secondary | ICD-10-CM | POA: Diagnosis not present

## 2021-11-27 DIAGNOSIS — R339 Retention of urine, unspecified: Secondary | ICD-10-CM | POA: Diagnosis not present

## 2021-11-27 DIAGNOSIS — R102 Pelvic and perineal pain: Secondary | ICD-10-CM | POA: Diagnosis not present

## 2021-11-27 DIAGNOSIS — R252 Cramp and spasm: Secondary | ICD-10-CM

## 2021-11-27 NOTE — Therapy (Signed)
[place] at [facility] [address], [place], [place], [address] Phone: [phone]   Fax:  [phone]  Physical Therapy Treatment  Patient Details  Name: Barbara Rangel MRN: [id number] Date of Birth: 1946/08/20 No data recorded  Encounter Date: 11/27/2021   PT End of Session - 11/27/21 1121     Visit Number 5    Date for PT Re-Evaluation 01/17/22    Authorization Type [place] - Visit Number 5    Authorization - Number of Visits 10    PT Start Time 2080    PT Stop Time 1123    PT Time Calculation (min) 53 min    Activity Tolerance Patient tolerated treatment well    Behavior During Therapy [facility] for tasks assessed/performed             Past Medical History:  Diagnosis Date   Arthritis    degenerative arthritis   Breast cancer, right breast ([place]) 04/2004   T2N1 diagnosed June 2005, ER PR + and Her 2 negative, post mastectomy with axillary node dissection (possibly 9 or 11 nodes removed, with possibly 7 or 9 involved) then treatment on NSABP B28 with dose dense adriamycin cytoxan followed by taxol, all of this Rx  in [place]. Adjuvant arimidex begun Jan 2006. No radiation.    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   Other and unspecified general anesthetics causing adverse effect in therapeutic use    hard to wake up   PONV (postoperative nausea and vomiting)    Syncope 09/15/2012    Past Surgical History:  Procedure Laterality Date   ANKLE SURGERY  2011   left ankle with screws and plates   BREAST LUMPECTOMY     x2   cataract surgery  2005   right eye   COLONOSCOPY     COLONOSCOPY WITH  PROPOFOL N/A 10/11/2014   Procedure: COLONOSCOPY WITH PROPOFOL;  Surgeon: Garlan Fair, MD;  [facility]: [facility];  Service: Endoscopy;  Laterality: N/A;   EYE SURGERY  2004   right eye-detached retina   left breast biopsy  2009   LUMBAR LAMINECTOMY/DECOMPRESSION MICRODISCECTOMY  10/31/2011   Procedure: LUMBAR LAMINECTOMY/DECOMPRESSION MICRODISCECTOMY;  Surgeon: Dahlia Bailiff;  Location: [facility];  Service: Orthopedics;  Laterality: Right;  L4-5 Decompression with Right Facet Decompression    MASTECTOMY  2005   right  d/t breast cancer;no sticks to right arm LYMPH NODES REMOVED.   RETINAL DETACHMENT SURGERY  2004   right breast biopsy  2005   TUBAL LIGATION  1980    There were no vitals filed for this visit.   Subjective Assessment - 11/27/21 1037     Subjective Cystoscopy was normal. Initial void is 6 ounces. After catherize can do The second void 3 ounces then caterize and get 4-6 ounces. No urinary leakage.    Patient Stated Goals goal is to empty her bladder fully    Currently in Pain? No/denies    Multiple Pain Sites No  Pelvic Floor Special Questions - 11/27/21 0001     Pelvic Floor Internal Exam Patietn confirms identification and approves PT to assess pelvic floor and treatment    Exam Type Vaginal    Strength weak squeeze, no lift               [facility] Adult PT Treatment/Exercise - 11/27/21 0001       Neuro Re-ed    Neuro Re-ed Details  diaphragmatic breathing in supine with feet on wall and sidely to expand the posterior rib cage and and 360 breathing  and not hinge at the thoracic lumbar junction. Patient needed tactile cues for the posterior rib cage expansion and the therapist gave tactile cues to the pelvic floro to feel it bulge down to relax      Manual Therapy   Manual Therapy Myofascial release;Internal Pelvic Floor    Myofascial Release release of the urogenital diaphragm going through the layers of  restrictions    Internal Pelvic Floor manual work along the levator ani with hip movements, release on the sides of the bladder, release along the urethra with one finger internal and other suprapubically                     PT Education - 11/27/21 1121     Education Details diaphragmatic breathing with back expansion in supine with feet on the wall and legs in a squat position    Person(s) Educated Patient    Methods Explanation;Demonstration    Comprehension Verbalized understanding;Returned demonstration              PT Short Term Goals - 11/15/21 1400       PT SHORT TERM GOAL #1   Title ind with initial HEP    Time 4    Period Weeks    Status Achieved      PT SHORT TERM GOAL #2   Title education on vaginal moisturizers to reduce dryness    Time 4    Period Weeks    Status Achieved               PT Long Term Goals - 11/27/21 1042       PT LONG TERM GOAL #1   Title Pt will be ind with final HEP for elongation of muscles    Time 12    Period Weeks    Status On-going    Target Date 01/17/22      PT LONG TERM GOAL #2   Title abiltiy to expand her lower rib cage for diaphragmatic breathing to elongate the pelvic floor    Time 12    Period Weeks    Status On-going    Target Date 01/17/22      PT LONG TERM GOAL #3   Title ability to fully empty her bladder so she is able to stop catherizing herself    Time 12    Period Weeks    Status On-going    Target Date 01/17/22      PT LONG TERM GOAL #4   Title pelvic floor strength >/= 3/5 due to the abilty to fully relax her pelvic floor then fully contract    Time 12    Period Weeks    Status On-going                   Plan - 11/27/21 1122     Clinical Impression Statement Patient has less restrictions in the urogenital diaphragm.  She has improved mobility of the bladder. She has tigthness in the levator ani. Patinet has some restrictions along the urethra. Patient is able to urinate 6  ounces consistently and after a double void will get 3 more ounces. Patient still has to [place] and will get 4 ounces out. Patient is able to bulge the pelvic floor better with the diaphragmatic breathing. She has some difficulty with expansion of her back with 360 breathing. Patient will benefit from skilled therapy to improve elongation of pelvic lfoor muscles to fully empty the bladder.    Personal Factors and Comorbidities Comorbidity 3+    Comorbidities Right breast cancer 04/2004; Mastectomy 2005; Lumbar laminectomy 10/2011; Glaucoma; Osteopenia    Examination-Activity Limitations Continence    Stability/Clinical Decision Making Stable/Uncomplicated    Rehab Potential Excellent    PT Frequency 1x / week    PT Duration 12 weeks    PT Treatment/Interventions Biofeedback;Therapeutic activities;Therapeutic exercise;Neuromuscular re-education;Patient/family education;Manual techniques;Dry needling;Taping    PT Next Visit Plan manual work to the pelvic floor, urogenital release, and right side of bladder; yoga moves to elongate the pelvic floor    PT Home Exercise Plan Access Code: [email or web address]    Consulted and Agree with Plan of Care Patient             Patient will benefit from skilled therapeutic intervention in order to improve the following deficits and impairments:  Increased fascial restricitons, Decreased strength, Decreased coordination  Visit Diagnosis: Cramp and spasm  Muscle weakness (generalized)     Problem List Patient Active Problem List   Diagnosis Date Noted   Carotid artery stenosis 01/05/2020   Neck pain 03/30/2018   Osteopenia determined by x-ray 08/28/2016   History of right breast cancer 08/28/2016   Malignant neoplasm of right female breast ([place]) 01/23/2016   History of chemotherapy 01/23/2016   Estrogen deficiency 07/29/2015   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/24/2015   Dense breast tissue 01/24/2015   Hx of adenomatous colonic polyps 01/24/2015    Plantar fasciitis, bilateral 01/24/2015   Breast cancer, right breast ([place]) 07/15/2013   Syncope 09/15/2012   Hypokalemia 09/15/2012   Chronic back pain    Chronic urinary tract infection    Hypertension    Arthritis    Synovial cyst of lumbar facet joint 11/01/2011    Earlie Counts, PT 11/27/21 11:28 AM   [place] at [facility] [address], [place], [place], [address] Phone: [phone]   Fax:  [phone]  Name: Barbara Rangel MRN: [id number] Date of Birth: [birth year]

## 2021-12-04 ENCOUNTER — Encounter: Payer: Self-pay | Admitting: Physical Therapy

## 2021-12-04 ENCOUNTER — Other Ambulatory Visit: Payer: Self-pay

## 2021-12-04 ENCOUNTER — Encounter: Payer: Medicare Other | Admitting: Physical Therapy

## 2021-12-04 ENCOUNTER — Telehealth: Payer: Self-pay | Admitting: Obstetrics and Gynecology

## 2021-12-04 DIAGNOSIS — R252 Cramp and spasm: Secondary | ICD-10-CM

## 2021-12-04 DIAGNOSIS — M6281 Muscle weakness (generalized): Secondary | ICD-10-CM

## 2021-12-04 DIAGNOSIS — R339 Retention of urine, unspecified: Secondary | ICD-10-CM | POA: Diagnosis not present

## 2021-12-04 DIAGNOSIS — R102 Pelvic and perineal pain: Secondary | ICD-10-CM | POA: Diagnosis not present

## 2021-12-04 NOTE — Patient Instructions (Signed)
Access Code: [email or web address] URL: [email or web address] Date: 12/04/2021 Prepared by: Earlie Counts  Program Notes sit on a ball and massage the pelvic floor muscles 10x each side 1 time per day    Exercises Child's Pose Stretch - 1 x daily - 2 x weekly - 1 sets - 2 reps - 30 sec hold Downward Dog - 1 x daily - 2 x weekly - 1 sets - 10 reps Cat Cow - 1 x daily - 2 x weekly - 1 sets - 10 reps Warrior I - 1 x daily - 2 x weekly - 1 sets - 2 reps Warrior II - 1 x daily - 2 x weekly - 1 sets - 2 reps Earlie Counts, PT [facility] [address] [place], [place] [address] W: [phone] [email or web address]

## 2021-12-04 NOTE — Therapy (Signed)
[place] at [facility] [address], [place], [place], [address] Phone: [phone]   Fax:  [phone]  Physical Therapy Treatment  Patient Details  Name: Barbara Rangel MRN: [id number] Date of Birth: [birth year] No data recorded  Encounter Date: 12/04/2021   PT End of Session - 12/04/21 1205     Visit Number 6    Date for PT Re-Evaluation 01/17/22    Authorization Type [place] - Visit Number 6    Authorization - Number of Visits 10    PT Start Time 1130    PT Stop Time 2767    PT Time Calculation (min) 50 min    Activity Tolerance Patient tolerated treatment well    Behavior During Therapy [facility] for tasks assessed/performed             Past Medical History:  Diagnosis Date   Arthritis    degenerative arthritis   Breast cancer, right breast ([place]) 04/2004   T2N1 diagnosed June 2005, ER PR + and Her 2 negative, post mastectomy with axillary node dissection (possibly 9 or 11 nodes removed, with possibly 7 or 9 involved) then treatment on NSABP B28 with dose dense adriamycin cytoxan followed by taxol, all of this Rx  in [place]. Adjuvant arimidex begun Jan 2006. No radiation.    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   Other and unspecified general anesthetics causing adverse effect in therapeutic use    hard to wake up   PONV (postoperative nausea and vomiting)    Syncope 09/15/2012    Past Surgical History:  Procedure Laterality Date   ANKLE SURGERY  2011   left ankle with screws and plates   BREAST LUMPECTOMY     x2   cataract surgery  2005   right eye   COLONOSCOPY     COLONOSCOPY WITH  PROPOFOL N/A 10/11/2014   Procedure: COLONOSCOPY WITH PROPOFOL;  Surgeon: Garlan Fair, MD;  Location: [facility];  Service: Endoscopy;  Laterality: N/A;   EYE SURGERY  2004   right eye-detached retina   left breast biopsy  2009   LUMBAR LAMINECTOMY/DECOMPRESSION MICRODISCECTOMY  10/31/2011   Procedure: LUMBAR LAMINECTOMY/DECOMPRESSION MICRODISCECTOMY;  Surgeon: Dahlia Bailiff;  Location: [place];  Service: Orthopedics;  Laterality: Right;  L4-5 Decompression with Right Facet Decompression    MASTECTOMY  2005   right  d/t breast cancer;no sticks to right arm LYMPH NODES REMOVED.   RETINAL DETACHMENT SURGERY  2004   right breast biopsy  2005   TUBAL LIGATION  1980    There were no vitals filed for this visit.   Subjective Assessment - 12/04/21 1138     Subjective I see a big change in the last week. Last several days I am retaining 4, 3, 2 oz.  now. The hip movement for second void assists me to get more urine out.    Patient Stated Goals goal is to empty her bladder fully    Currently in Pain? No/denies    Multiple Pain Sites No  Pelvic Floor Special Questions - 12/04/21 0001     Pelvic Floor Internal Exam Patietn confirms identification and approves PT to assess pelvic floor and treatment    Exam Type Vaginal               [facility] Adult PT Treatment/Exercise - 12/04/21 0001       Balance Poses: Yoga   Warrior I 2 reps;15 seconds    Warrior II 2 reps;15 seconds      Self-Care   Self-Care Other Self-Care Comments    Other Self-Care Comments  discussed with patient on using the stretch zone, pilates or yoga and which one would be better for her.      Lumbar Exercises: Stretches   Quadruped Mid Back Stretch 1 rep;30 seconds    Quadruped Mid Back Stretch Limitations childs pose    Other Lumbar Stretch Exercise downward dog with walking heels out      Lumbar Exercises: Quadruped   Madcat/Old Horse 15 reps      Manual  Therapy   Manual Therapy Myofascial release;Internal Pelvic Floor    Myofascial Release release of the urogenital diaphragm going through the layers of restrictions    Internal Pelvic Floor manual work along the levator ani with hip movements, release on the sides of the bladder, release along the urethra with one finger internal and other suprapubically                     PT Education - 12/04/21 1205     Education Details Access Code: [email or web address]    Person(s) Educated Patient    Methods Explanation;Demonstration;Verbal cues;Handout    Comprehension Verbalized understanding;Returned demonstration              PT Short Term Goals - 11/15/21 1400       PT SHORT TERM GOAL #1   Title ind with initial HEP    Time 4    Period Weeks    Status Achieved      PT SHORT TERM GOAL #2   Title education on vaginal moisturizers to reduce dryness    Time 4    Period Weeks    Status Achieved               PT Long Term Goals - 12/04/21 1231       PT LONG TERM GOAL #1   Title Pt will be ind with final HEP for elongation of muscles    Time 12    Period Weeks    Status On-going    Target Date 01/17/22      PT LONG TERM GOAL #2   Title abiltiy to expand her lower rib cage for diaphragmatic breathing to elongate the pelvic floor    Time 12    Period Weeks    Status On-going      PT LONG TERM GOAL #3   Title ability to fully empty her bladder so she is able to stop catherizing herself    Time 12    Period Weeks    Status On-going      PT LONG TERM GOAL #4   Title pelvic floor strength >/= 3/5 due to the abilty to fully relax her pelvic floor then fully contract    Time 12    Period Weeks    Status On-going                   Plan - 12/04/21 1205  Clinical Impression Statement Patient reports she is able to empty her bladder more. She will caterize 4, 3, 2 ounces now. Patient is doing the double void and that is helping her get the remainder of the  urine out. Patient has learned yoga moves to assis tin relaxation of the pelvic floor. She has had minimal restrctions on the sides of the bladder. Patient will be looking into yoga and Tai Chi to do at home or at a class. Patient will benefit from skilled therapy to improve elongation of pelvic floor muscles to fully empty her bladder.    Personal Factors and Comorbidities Comorbidity 3+    Comorbidities Right breast cancer 04/2004; Mastectomy 2005; Lumbar laminectomy 10/2011; Glaucoma; Osteopenia    Examination-Activity Limitations Continence    Stability/Clinical Decision Making Stable/Uncomplicated    Rehab Potential Excellent    PT Frequency 1x / week    PT Duration 12 weeks    PT Treatment/Interventions Biofeedback;Therapeutic activities;Therapeutic exercise;Neuromuscular re-education;Patient/family education;Manual techniques;Dry needling;Taping    PT Next Visit Plan manual work to the pelvic floor, urogenital release, and right side of bladder; see how she is doing with emptying her bladder; check pelvic floor strength    PT Home Exercise Plan Access Code: [email or web address]    Consulted and Agree with Plan of Care Patient             Patient will benefit from skilled therapeutic intervention in order to improve the following deficits and impairments:  Increased fascial restricitons, Decreased strength, Decreased coordination  Visit Diagnosis: Cramp and spasm  Muscle weakness (generalized)     Problem List Patient Active Problem List   Diagnosis Date Noted   Carotid artery stenosis 01/05/2020   Neck pain 03/30/2018   Osteopenia determined by x-ray 08/28/2016   History of right breast cancer 08/28/2016   Malignant neoplasm of right female breast ([place]) 01/23/2016   History of chemotherapy 01/23/2016   Estrogen deficiency 07/29/2015   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/24/2015   Dense breast tissue 01/24/2015   Hx of adenomatous colonic polyps 01/24/2015   Plantar fasciitis,  bilateral 01/24/2015   Breast cancer, right breast ([place]) 07/15/2013   Syncope 09/15/2012   Hypokalemia 09/15/2012   Chronic back pain    Chronic urinary tract infection    Hypertension    Arthritis    Synovial cyst of lumbar facet joint 11/01/2011    Earlie Counts, PT 12/04/21 12:32 PM  [facility] [address] [place], [place], [address] Phone: [phone]   Fax:  [phone]  Name: VIRTIE BUNGERT MRN: [id number] Date of Birth: 04/11/1946

## 2021-12-07 ENCOUNTER — Other Ambulatory Visit: Payer: Self-pay | Admitting: Hematology and Oncology

## 2021-12-07 DIAGNOSIS — Z780 Asymptomatic menopausal state: Secondary | ICD-10-CM

## 2021-12-07 DIAGNOSIS — Z1231 Encounter for screening mammogram for malignant neoplasm of breast: Secondary | ICD-10-CM

## 2021-12-10 ENCOUNTER — Encounter: Payer: Self-pay | Admitting: Obstetrics and Gynecology

## 2021-12-10 NOTE — Telephone Encounter (Signed)
Patient called stating catheterized volumes have improved with physical therapy. She is now trying to double void and is cathing 5 times per day, getting 7oz or less each time (previously >8oz). Recommended decreasing catheterizing to 3 times per day and assessing volumes and symptoms of urgency. She may be able to discontinue if volumes are 7 oz or less consistently.    [name], MD

## 2021-12-13 ENCOUNTER — Other Ambulatory Visit: Payer: Self-pay

## 2021-12-13 ENCOUNTER — Ambulatory Visit: Payer: Medicare Other | Admitting: Sports Medicine

## 2021-12-13 DIAGNOSIS — B351 Tinea unguium: Secondary | ICD-10-CM | POA: Diagnosis not present

## 2021-12-13 DIAGNOSIS — I739 Peripheral vascular disease, unspecified: Secondary | ICD-10-CM | POA: Diagnosis not present

## 2021-12-13 DIAGNOSIS — M79676 Pain in unspecified toe(s): Secondary | ICD-10-CM

## 2021-12-13 DIAGNOSIS — Q828 Other specified congenital malformations of skin: Secondary | ICD-10-CM

## 2021-12-13 DIAGNOSIS — L989 Disorder of the skin and subcutaneous tissue, unspecified: Secondary | ICD-10-CM

## 2021-12-13 NOTE — Progress Notes (Signed)
Subjective: REEGAN MCTIGHE is a 76 y.o. female patient who returns to office for follow up evaluation of right greater than left foot pain secondary to callus skin and for nail trim. Patient reports that one of her medications may have changed but otherwise doing really good.  No other issues noted.  Patient Active Problem List   Diagnosis Date Noted   Carotid artery stenosis 01/05/2020   Neck pain 03/30/2018   Osteopenia determined by x-ray 08/28/2016   History of right breast cancer 08/28/2016   Malignant neoplasm of right female breast ([facility]) 01/23/2016   History of chemotherapy 01/23/2016   Estrogen deficiency 07/29/2015   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/24/2015   Dense breast tissue 01/24/2015   Hx of adenomatous colonic polyps 01/24/2015   Plantar fasciitis, bilateral 01/24/2015   Breast cancer, right breast ([facility]) 07/15/2013   Syncope 09/15/2012   Hypokalemia 09/15/2012   Chronic back pain    Chronic urinary tract infection    Hypertension    Arthritis    Synovial cyst of lumbar facet joint 11/01/2011    Current Outpatient Medications on File Prior to Visit  Medication Sig Dispense Refill   acetaminophen (TYLENOL) 500 MG tablet Take 1,000 mg by mouth at bedtime.     amLODipine (NORVASC) 5 MG tablet Take 5 mg by mouth at bedtime.      aspirin EC 81 MG tablet Take 81 mg by mouth every morning.      atorvastatin (LIPITOR) 20 MG tablet Take 1 tablet by mouth every morning.     calcium citrate-vitamin D (CITRACAL+D) 315-200 MG-UNIT per tablet Take 2 tablets by mouth 2 (two) times daily.     carbamide peroxide (DEBROX) 6.5 % OTIC solution Place 5 drops into both ears 2 (two) times daily. 15 mL 0   cetirizine (ZYRTEC ALLERGY) 10 MG tablet Take 1 tablet (10 mg total) by mouth daily.     clotrimazole (MYCELEX) 10 MG troche SMARTSIG:1 Lozenge(s) By Mouth 4 Times Daily     Cranberry Extract 250 MG TABS Take 1 capsule by mouth at bedtime.      diazepam (VALIUM) 5 MG tablet  Place 1 tablet vaginally nightly as needed for muscle spasm/ pelvic pain. 30 tablet 0   dorzolamide-timolol (COSOPT) 22.3-6.8 MG/ML ophthalmic solution SMARTSIG:In Eye(s)     fluconazole (DIFLUCAN) 150 MG tablet Take 150 mg by mouth once.     guaiFENesin (MUCINEX) 600 MG 12 hr tablet Take 1 tablet (600 mg total) by mouth 2 (two) times daily as needed.     latanoprost (XALATAN) 0.005 % ophthalmic solution Place 1 drop into both eyes at bedtime. 2.5 mL 1   Multiple Vitamins-Minerals (MULTIVITAMINS THER. W/MINERALS) TABS Take 1 tablet by mouth every morning.     nitrofurantoin, macrocrystal-monohydrate, (MACROBID) 100 MG capsule Take 1 capsule (100 mg total) by mouth daily. 30 capsule 5   omeprazole (PRILOSEC) 20 MG capsule Take 1 capsule (20 mg total) by mouth daily.     SHINGRIX injection      No current facility-administered medications on file prior to visit.    Allergies  Allergen Reactions   Nitrous Oxide Other (See Comments)    Pt passed out and had confusion. Took a few hours to wake her up.   Avelox [Moxifloxacin Hcl In Nacl] Other (See Comments)    Severe headache    Cephalexin Other (See Comments)    Caused headache   Other Other (See Comments)    Anesthesia, needs antiemetic med  Quinolones Other (See Comments)    Headaches?   Tramadol Nausea Only and Other (See Comments)    Nausea and Dizziness, too.   Trimethoprim Other (See Comments)    Rapid heartbeat.   Tizanidine Hcl Other (See Comments)   Citalopram Hydrobromide Other (See Comments)    Pt can't remember what the side effect was.    Objective:  General: Alert and oriented x3 in no acute distress  Dermatology: Keratotic lesion present sub-met 5 on right and bilateral hallux with skin lines transversing the lesion, pain is present with direct pressure to the lesion with a central nucleated core noted, no webspace macerations, no ecchymosis bilateral, all nails x 10 are elongated and thickened consistent with  onychomycosis.  Vascular: Dorsalis Pedis and Posterior Tibial pedal pulses 1/4, Capillary Fill Time 3 seconds, + pedal hair growth bilateral, no edema bilateral lower extremities, Temperature gradient within normal limits.  Neurology: Johney Maine sensation intact via light touch bilateral.  Musculoskeletal: Mild tenderness with palpation at the keratotic lesion site on Right greater than left, fat pad atrophy bunion and hammertoe deformity pes planus foot type with pain to right sub-met 5 at area of nucleation as noted above.  Muscle strength within normal limits.  Assessment and Plan: Problem List Items Addressed This Visit   None Visit Diagnoses     Porokeratosis    -  Primary   Benign skin lesion       Pain due to onychomycosis of nail       Relevant Medications   clotrimazole (MYCELEX) 10 MG troche   fluconazole (DIFLUCAN) 150 MG tablet   PVD (peripheral vascular disease) (HCC)          -Complete examination performed -Re-Discussed treatment options for calluses and nails -Mechanically debrided nails x10 using a sterile nail nipper without incident -At no additional charge, parred keratoic lesion using a chisel blade x1 on left and x2 on right without incident -Continue with daily skin emollients like before; sample of foot miracle cream provided -Continue with good supportive shoes daily for foot type -Patient to return to office in 3 months or as needed or sooner if condition worsens.  Landis Martins, DPM

## 2021-12-19 DIAGNOSIS — E785 Hyperlipidemia, unspecified: Secondary | ICD-10-CM | POA: Diagnosis not present

## 2021-12-19 DIAGNOSIS — I10 Essential (primary) hypertension: Secondary | ICD-10-CM | POA: Diagnosis not present

## 2021-12-19 DIAGNOSIS — R899 Unspecified abnormal finding in specimens from other organs, systems and tissues: Secondary | ICD-10-CM | POA: Diagnosis not present

## 2021-12-19 DIAGNOSIS — I779 Disorder of arteries and arterioles, unspecified: Secondary | ICD-10-CM | POA: Diagnosis not present

## 2021-12-19 DIAGNOSIS — N390 Urinary tract infection, site not specified: Secondary | ICD-10-CM | POA: Diagnosis not present

## 2021-12-19 DIAGNOSIS — C50911 Malignant neoplasm of unspecified site of right female breast: Secondary | ICD-10-CM | POA: Diagnosis not present

## 2021-12-25 ENCOUNTER — Other Ambulatory Visit: Payer: Self-pay

## 2021-12-25 ENCOUNTER — Encounter: Payer: Medicare Other | Attending: Obstetrics and Gynecology | Admitting: Physical Therapy

## 2021-12-25 ENCOUNTER — Encounter: Payer: Self-pay | Admitting: Physical Therapy

## 2021-12-25 DIAGNOSIS — M6281 Muscle weakness (generalized): Secondary | ICD-10-CM

## 2021-12-25 DIAGNOSIS — R339 Retention of urine, unspecified: Secondary | ICD-10-CM | POA: Insufficient documentation

## 2021-12-25 DIAGNOSIS — R252 Cramp and spasm: Secondary | ICD-10-CM

## 2021-12-25 NOTE — Therapy (Signed)
[place] at [facility] [address] [place], [place], [address] Phone: [phone]   Fax:  [phone]  Physical Therapy Treatment  Patient Details  Name: Barbara Rangel MRN: [id number] Date of Birth: 1946/06/25 No data recorded  Encounter Date: 12/25/2021   PT End of Session - 12/25/21 1541     Visit Number 7    Date for PT Re-Evaluation 01/17/22    Authorization Type [place] - Visit Number 7    Authorization - Number of Visits 10    PT Start Time 1500    PT Stop Time 1545    PT Time Calculation (min) 45 min    Activity Tolerance Patient tolerated treatment well    Behavior During Therapy [facility] for tasks assessed/performed             Past Medical History:  Diagnosis Date   Arthritis    degenerative arthritis   Breast cancer, right breast ([place]) 04/2004   T2N1 diagnosed June 2005, ER PR + and Her 2 negative, post mastectomy with axillary node dissection (possibly 9 or 11 nodes removed, with possibly 7 or 9 involved) then treatment on NSABP B28 with dose dense adriamycin cytoxan followed by taxol, all of this Rx  in [place]. Adjuvant arimidex begun Jan 2006. No radiation.    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   Other and unspecified general anesthetics causing adverse effect in therapeutic use    hard to wake up   PONV (postoperative nausea and vomiting)    Syncope 09/15/2012    Past Surgical History:  Procedure Laterality Date   ANKLE SURGERY  2011   left ankle with screws and plates   BREAST LUMPECTOMY     x2   cataract surgery  2005   right eye   COLONOSCOPY     COLONOSCOPY WITH  PROPOFOL N/A 10/11/2014   Procedure: COLONOSCOPY WITH PROPOFOL;  Surgeon: Garlan Fair, MD;  Location: [facility];  Service: Endoscopy;  Laterality: N/A;   EYE SURGERY  2004   right eye-detached retina   left breast biopsy  2009   LUMBAR LAMINECTOMY/DECOMPRESSION MICRODISCECTOMY  10/31/2011   Procedure: LUMBAR LAMINECTOMY/DECOMPRESSION MICRODISCECTOMY;  Surgeon: Dahlia Bailiff;  Location: [place];  Service: Orthopedics;  Laterality: Right;  L4-5 Decompression with Right Facet Decompression    MASTECTOMY  2005   right  d/t breast cancer;no sticks to right arm LYMPH NODES REMOVED.   RETINAL DETACHMENT SURGERY  2004   right breast biopsy  2005   TUBAL LIGATION  1980    There were no vitals filed for this visit.   Subjective Assessment - 12/25/21 1504     Subjective The double voiding is helping. Sometimes takes me 15-20 minutes to get the second void. Usually get 2-3 ounces on the second void. Use the catheter after the second void and have 2-3 ounces left. I am now using the catheter 3 times per day instead of 2 times. I have started the back with the yoga classes. [facility]  Patient Stated Goals goal is to empty her bladder fully    Currently in Pain? No/denies    Multiple Pain Sites No                            Pelvic Floor Special Questions - 12/25/21 0001     Pelvic Floor Internal Exam Patietn confirms identification and approves PT to assess pelvic floor and treatment    Exam Type Vaginal    Strength fair squeeze, definite lift   weakn lift              [facility] Adult PT Treatment/Exercise - 12/25/21 0001       Self-Care   Self-Care Other Self-Care Comments    Other Self-Care Comments  discussed with patient on her catherization and double voiding. She will be talking to the doctor to discuss her catherization further      Manual Therapy   Manual Therapy Internal Pelvic Floor    Internal  Pelvic Floor laying on left sidely with manual work to bilateral leavtor ani, posterior vaginal canal, along the obturator internist, sides of the urethra and bladder to elongate the tissue and assist with releasing the urine                       PT Short Term Goals - 11/15/21 1400       PT SHORT TERM GOAL #1   Title ind with initial HEP    Time 4    Period Weeks    Status Achieved      PT SHORT TERM GOAL #2   Title education on vaginal moisturizers to reduce dryness    Time 4    Period Weeks    Status Achieved               PT Long Term Goals - 12/25/21 1544       PT LONG TERM GOAL #1   Title Pt will be ind with final HEP for elongation of muscles    Time 12    Period Weeks    Status On-going      PT LONG TERM GOAL #2   Title abiltiy to expand her lower rib cage for diaphragmatic breathing to elongate the pelvic floor    Time 12    Period Weeks    Status On-going      PT LONG TERM GOAL #3   Time 12    Period Weeks    Status On-going      PT LONG TERM GOAL #4   Title pelvic floor strength >/= 3/5 due to the abilty to fully relax her pelvic floor then fully contract    Time 12    Period Weeks    Status On-going                   Plan - 12/25/21 1542     Clinical Impression Statement Patient is using the double void method and is assisting to get more urine out. It may take her 15-20 minutes to get all the urine out. She is now taking a yoga class to assist with stretching and relaxing of the pelvic floor. Patient had some fascial restrictions along the pelvic floor muscles left more than right and sides of bladder and urethra sphincter. Pelvic floor strength is 3/5 with a weak lift. Patient will benefit from skilled therapy to improve elongation of pelvic floor muscles to  fully empty her bladder.    Personal Factors and Comorbidities Comorbidity 3+    Comorbidities Right breast cancer 04/2004; Mastectomy 2005; Lumbar laminectomy 10/2011;  Glaucoma; Osteopenia    Examination-Activity Limitations Continence    Stability/Clinical Decision Making Stable/Uncomplicated    Rehab Potential Excellent    PT Frequency 1x / week    PT Duration 12 weeks    PT Treatment/Interventions Biofeedback;Therapeutic activities;Therapeutic exercise;Neuromuscular re-education;Patient/family education;Manual techniques;Dry needling;Taping    PT Next Visit Plan manual work to the pelvic floor,in sidely. if needs to continue then write a renewal.    PT Home Exercise Plan Access Code: [email or web address]    Consulted and Agree with Plan of Care Patient             Patient will benefit from skilled therapeutic intervention in order to improve the following deficits and impairments:  Increased fascial restricitons, Decreased strength, Decreased coordination  Visit Diagnosis: Cramp and spasm  Muscle weakness (generalized)     Problem List Patient Active Problem List   Diagnosis Date Noted   Carotid artery stenosis 01/05/2020   Neck pain 03/30/2018   Osteopenia determined by x-ray 08/28/2016   History of right breast cancer 08/28/2016   Malignant neoplasm of right female breast ([place]) 01/23/2016   History of chemotherapy 01/23/2016   Estrogen deficiency 07/29/2015   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/24/2015   Dense breast tissue 01/24/2015   Hx of adenomatous colonic polyps 01/24/2015   Plantar fasciitis, bilateral 01/24/2015   Breast cancer, right breast ([place]) 07/15/2013   Syncope 09/15/2012   Hypokalemia 09/15/2012   Chronic back pain    Chronic urinary tract infection    Hypertension    Arthritis    Synovial cyst of lumbar facet joint 11/01/2011    Earlie Counts, PT 12/25/21 3:48 PM  [facility] [address] [place], [place], [address] Phone: [phone]   Fax:  [phone]  Name: JAYNIA FENDLEY MRN: [id number] Date of Birth: 11/30/1945

## 2021-12-28 DIAGNOSIS — L570 Actinic keratosis: Secondary | ICD-10-CM | POA: Diagnosis not present

## 2022-01-08 ENCOUNTER — Telehealth: Payer: Self-pay | Admitting: Obstetrics and Gynecology

## 2022-01-09 ENCOUNTER — Other Ambulatory Visit: Payer: Self-pay

## 2022-01-09 DIAGNOSIS — I6523 Occlusion and stenosis of bilateral carotid arteries: Secondary | ICD-10-CM

## 2022-01-10 ENCOUNTER — Encounter: Payer: Medicare Other | Attending: Obstetrics and Gynecology | Admitting: Physical Therapy

## 2022-01-10 ENCOUNTER — Other Ambulatory Visit: Payer: Self-pay

## 2022-01-10 ENCOUNTER — Encounter: Payer: Self-pay | Admitting: Physical Therapy

## 2022-01-10 DIAGNOSIS — M6281 Muscle weakness (generalized): Secondary | ICD-10-CM | POA: Insufficient documentation

## 2022-01-10 DIAGNOSIS — R252 Cramp and spasm: Secondary | ICD-10-CM | POA: Insufficient documentation

## 2022-01-10 NOTE — Therapy (Signed)
[place] at [facility] [address], [place], [place], [address] Phone: [phone]   Fax:  [phone]  Physical Therapy Treatment  Patient Details  Name: Barbara Rangel MRN: [id number] Date of Birth: September 12, 1946 Referring Provider (PT): Dr. Sherlene Shams   Encounter Date: 01/10/2022   PT End of Session - 01/10/22 1513     Visit Number 8    Date for PT Re-Evaluation 04/12/22    Authorization Type [employer] - Visit Number 8    Authorization - Number of Visits 10    PT Start Time 1500    PT Stop Time 1545    PT Time Calculation (min) 45 min    Activity Tolerance Patient tolerated treatment well    Behavior During Therapy [facility] for tasks assessed/performed             Past Medical History:  Diagnosis Date   Arthritis    degenerative arthritis   Breast cancer, right breast ([place]) 04/2004   T2N1 diagnosed June 2005, ER PR + and Her 2 negative, post mastectomy with axillary node dissection (possibly 9 or 11 nodes removed, with possibly 7 or 9 involved) then treatment on NSABP B28 with dose dense adriamycin cytoxan followed by taxol, all of this Rx  in [place]. Adjuvant arimidex begun Jan 2006. No radiation.    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   Other and unspecified general anesthetics causing adverse effect in therapeutic use    hard to wake up   PONV (postoperative nausea and vomiting)    Syncope 09/15/2012    Past Surgical History:  Procedure Laterality Date   ANKLE SURGERY  2011   left ankle with screws and plates   BREAST LUMPECTOMY     x2   cataract surgery  2005   right eye    COLONOSCOPY     COLONOSCOPY WITH PROPOFOL N/A 10/11/2014   Procedure: COLONOSCOPY WITH PROPOFOL;  Surgeon: Garlan Fair, MD;  Location: [facility];  Service: Endoscopy;  Laterality: N/A;   EYE SURGERY  2004   right eye-detached retina   left breast biopsy  2009   LUMBAR LAMINECTOMY/DECOMPRESSION MICRODISCECTOMY  10/31/2011   Procedure: LUMBAR LAMINECTOMY/DECOMPRESSION MICRODISCECTOMY;  Surgeon: Dahlia Bailiff;  Location: [place];  Service: Orthopedics;  Laterality: Right;  L4-5 Decompression with Right Facet Decompression    MASTECTOMY  2005   right  d/t breast cancer;no sticks to right arm LYMPH NODES REMOVED.   RETINAL DETACHMENT SURGERY  2004   right breast biopsy  2005   TUBAL LIGATION  1980    There were no vitals filed for this visit.   Subjective Assessment - 01/10/22 1505     Subjective I am cathrizing my self 3 times per day instead of 5 times per day. I have started my yoga. [facility] I am walking again on a regular basis 5 times per week. Does double voiding everytime I go due to residual urine in bladder. Goal is to keep the residual urine below 5.    Patient Stated Goals goal is  to empty her bladder fully    Currently in Pain? No/denies                [facility] PT Assessment - 01/10/22 0001       Assessment   Medical Diagnosis N33.9 Incomplete bladder emptying    Referring Provider (PT) Dr. Sherlene Shams    Prior Therapy yes in the past      Precautions   Precautions Other (comment)    Precaution Comments breast cancer      Restrictions   Weight Bearing Restrictions No      [place] residence      Prior Function   Level of Independence Independent    Leisure when pandemic came she reduced her exercise      Cognition   Overall Cognitive Status Within Functional Limits for tasks assessed      AROM   Overall AROM Comments lumbar ROM is full      Strength   Right Hip Flexion 4/5     Right Hip Extension 4-/5    Right Hip ABduction 4-/5    Left Hip Flexion 4/5    Left Hip Extension 4-/5    Left Hip ABduction 4/5                        Pelvic Floor Special Questions - 01/10/22 0001     Urinary Leakage No    Fecal incontinence No    Pelvic Floor Internal Exam Patient confirms identification and approves PT to assess pelvic floor and treatment    Exam Type Vaginal    Strength fair squeeze, definite lift               [facility] Adult PT Treatment/Exercise - 01/10/22 0001       Self-Care   Self-Care Other Self-Care Comments    Other Self-Care Comments  discussed with patient to use the vitamin E suppositiory 3 times per week due to her getting more urine out on the days she uses it.      Manual Therapy   Manual Therapy Internal Pelvic Floor    Internal Pelvic Floor performed techniques on both side, released around both side of the urethera and bladder with one finger on each side, manuao work on the puborectalis with one finger internally and other externally, release around the levator ani and oturator internist and urethra sphincter.                       PT Short Term Goals - 01/10/22 1507       PT SHORT TERM GOAL #1   Title ind with initial HEP    Time 4    Period Weeks    Status Achieved    Target Date 11/22/21      PT SHORT TERM GOAL #2   Title education on vaginal moisturizers to reduce dryness    Time 4    Period Weeks    Status Achieved    Target Date 11/22/21               PT Long Term Goals - 01/10/22 1507       PT LONG TERM GOAL #1   Title Pt will be ind with final HEP for elongation of muscles    Time 12    Period Weeks    Status On-going    Target Date 01/17/22  PT LONG TERM GOAL #2   Title abiltiy to expand her lower rib cage for diaphragmatic breathing to elongate the pelvic floor    Time 12    Period Weeks    Status On-going    Target Date 01/17/22      PT LONG TERM GOAL #3   Title  ability to fully empty her bladder so she is able to stop catherizing herself    Baseline down to 3 times per day    Time 12    Period Weeks    Status On-going      PT LONG TERM GOAL #4   Title pelvic floor strength >/= 3/5 due to the abilty to fully relax her pelvic floor then fully contract    Time 12    Period Weeks    Status On-going    Target Date 01/17/22                   Plan - 01/10/22 1554     Clinical Impression Statement Patient is now caterizing herself 3 times per day instead of 5 times. She is getting lower volumes more frequently. Patient has to double void to get the extra urine out but the whole process takes around 15 minutes in the commode. Patient has increased strength in her hips. She isnow walking more consistenlty. She is now doing yoga 1 time per week to elongate her muscles. She continues to have tightness in the pelvic floor muscles making it difficult to fully empty her bladder. Pelvic floor strength is 3/5 with a stronger lift. Patient will benefit from skilled therapy to improve emptying her bladder and elongation of the pelvic floor muscles.    Personal Factors and Comorbidities Comorbidity 3+    Comorbidities Right breast cancer 04/2004; Mastectomy 2005; Lumbar laminectomy 10/2011; Glaucoma; Osteopenia    Examination-Activity Limitations Continence    Stability/Clinical Decision Making Stable/Uncomplicated    Rehab Potential Excellent    PT Frequency 1x / week    PT Duration 12 weeks    PT Treatment/Interventions Biofeedback;Therapeutic activities;Therapeutic exercise;Neuromuscular re-education;Patient/family education;Manual techniques;Dry needling;Taping    PT Next Visit Plan educate on using the vaginal wand, work on the pelvic floor muscles in sidely    PT Home Exercise Plan Access Code: [email or web address]    Recommended Other Services sent MD renewal    Consulted and Agree with Plan of Care Patient             Patient will benefit from skilled  therapeutic intervention in order to improve the following deficits and impairments:  Increased fascial restricitons, Decreased strength, Decreased coordination  Visit Diagnosis: Cramp and spasm  Muscle weakness (generalized)     Problem List Patient Active Problem List   Diagnosis Date Noted   Carotid artery stenosis 01/05/2020   Neck pain 03/30/2018   Osteopenia determined by x-ray 08/28/2016   History of right breast cancer 08/28/2016   Malignant neoplasm of right female breast ([place]) 01/23/2016   History of chemotherapy 01/23/2016   Estrogen deficiency 07/29/2015   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/24/2015   Dense breast tissue 01/24/2015   Hx of adenomatous colonic polyps 01/24/2015   Plantar fasciitis, bilateral 01/24/2015   Breast cancer, right breast ([place]) 07/15/2013   Syncope 09/15/2012   Hypokalemia 09/15/2012   Chronic back pain    Chronic urinary tract infection    Hypertension    Arthritis    Synovial cyst of lumbar facet joint 11/01/2011    Earlie Counts, PT  01/10/22 4:00 PM  [facility] at [facility] [address] [place], [place], [address] Phone: [phone]   Fax:  [phone]  Name: PRICILLA MOEHLE MRN: [id number] Date of Birth: 1946/05/10

## 2022-01-10 NOTE — Telephone Encounter (Signed)
Patient reports she has been double voiding, then after catheterizes for 4oz or less. She is catheterizing 3x per day. We discussed that if the double voiding is helping, then she does not need to catheterize. She has the catheterization as a backup if she is unable to do the double void. She is doing well and improving with physical therapy.  ? ?Barbara Folds, MD ? ?

## 2022-01-11 ENCOUNTER — Other Ambulatory Visit: Payer: Self-pay | Admitting: Internal Medicine

## 2022-01-11 ENCOUNTER — Ambulatory Visit
Admission: RE | Admit: 2022-01-11 | Discharge: 2022-01-11 | Disposition: A | Discharge status: Home / Self Care | Payer: Medicare Other | Source: Ambulatory Visit | Attending: Internal Medicine | Admitting: Internal Medicine

## 2022-01-11 DIAGNOSIS — M26601 Right temporomandibular joint disorder, unspecified: Secondary | ICD-10-CM | POA: Diagnosis not present

## 2022-01-11 DIAGNOSIS — M26641 Arthritis of right temporomandibular joint: Secondary | ICD-10-CM | POA: Diagnosis not present

## 2022-01-15 DIAGNOSIS — M26641 Arthritis of right temporomandibular joint: Secondary | ICD-10-CM | POA: Diagnosis not present

## 2022-01-17 NOTE — Progress Notes (Unsigned)
HISTORY AND PHYSICAL     CC:  follow up. Requesting Provider:  Leeroy Cha,*  HPI: This is a 76 y.o. female here for follow up for carotid artery stenosis.   She has been followed since 2018 for moderate Bilateral ICA stenosis. Initially her Left was more significant that the right. She has been asymptomatic.  Pt was last seen 01/05/2021 and at that time overall she was doing well but was having recent headaches.  She described them as very localized and at times intense but they move from being at the back of her head to her right side of head, to top of head and occasionally some pain goes down right side of her neck. She noticed that this was occurring when she would go for her daily walks. If she stopped walking they would subside. They had gone away for a while and she had resumed her walking without issues but more recently over past 3 months they returned.  She was not having any stroke sx.  She did not have any claudication or rest pain.  Her duplex revealed 1-39% right ICA  and 40-59% left ICA stenosis.   Pt returns today for follow up.    Pt *** any amaurosis fugax, speech difficulties, weakness, numbness, paralysis or clumsiness or facial droop.    ***  The pt is on a statin for cholesterol management.  The pt is on a daily aspirin.   Other AC:  none The pt is on CCB for hypertension.   The pt is not diabetic.   Tobacco hx:  never  Pt does *** have family hx of AAA.  Past Medical History:  Diagnosis Date   Arthritis    degenerative arthritis   Breast cancer, right breast ([place]) 04/2004   T2N1 diagnosed June 2005, ER PR + and Her 2 negative, post mastectomy with axillary node dissection (possibly 9 or 11 nodes removed, with possibly 7 or 9 involved) then treatment on NSABP B28 with dose dense adriamycin cytoxan followed by taxol, all of this Rx  in [place]. Adjuvant arimidex begun Jan 2006. No radiation.    Chronic back pain    right radicular leg pain    Chronic urinary tract infection    takes Macrodantin and Cranberry daily   Constipation    r/t pain meds and takes Dulcolax nightly   Glaucoma    uses Xalantan nightly   Hypertension    takes Amlodipine daily   Hypokalemia 09/15/2012   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/24/2015   Other and unspecified general anesthetics causing adverse effect in therapeutic use    hard to wake up   PONV (postoperative nausea and vomiting)    Syncope 09/15/2012    Past Surgical History:  Procedure Laterality Date   ANKLE SURGERY  2011   left ankle with screws and plates   BREAST LUMPECTOMY     x2   cataract surgery  2005   right eye   COLONOSCOPY     COLONOSCOPY WITH PROPOFOL N/A 10/11/2014   Procedure: COLONOSCOPY WITH PROPOFOL;  Surgeon: Garlan Fair, MD;  [facility]: [facility];  Service: Endoscopy;  Laterality: N/A;   EYE SURGERY  2004   right eye-detached retina   left breast biopsy  2009   LUMBAR LAMINECTOMY/DECOMPRESSION MICRODISCECTOMY  10/31/2011   Procedure: LUMBAR LAMINECTOMY/DECOMPRESSION MICRODISCECTOMY;  Surgeon: Dahlia Bailiff;  Location: [place] [facility];  Service: Orthopedics;  Laterality: Right;  L4-5 Decompression with Right Facet Decompression    MASTECTOMY  2005  right  d/t breast cancer;no sticks to right arm LYMPH NODES REMOVED.   RETINAL DETACHMENT SURGERY  2004   right breast biopsy  2005   TUBAL LIGATION  1980    Allergies  Allergen Reactions   Nitrous Oxide Other (See Comments)    Pt passed out and had confusion. Took a few hours to wake her up.   Avelox [Moxifloxacin Hcl In Nacl] Other (See Comments)    Severe headache    Cephalexin Other (See Comments)    Caused headache   Other Other (See Comments)    Anesthesia, needs antiemetic med   Quinolones Other (See Comments)    Headaches?   Tramadol Nausea Only and Other (See Comments)    Nausea and Dizziness, too.   Trimethoprim Other (See Comments)    Rapid heartbeat.   Tizanidine Hcl Other (See Comments)    Citalopram Hydrobromide Other (See Comments)    Pt can't remember what the side effect was.    Current Outpatient Medications  Medication Sig Dispense Refill   acetaminophen (TYLENOL) 500 MG tablet Take 1,000 mg by mouth at bedtime.     amLODipine (NORVASC) 5 MG tablet Take 5 mg by mouth at bedtime.      aspirin EC 81 MG tablet Take 81 mg by mouth every morning.      atorvastatin (LIPITOR) 20 MG tablet Take 1 tablet by mouth every morning.     calcium citrate-vitamin D (CITRACAL+D) 315-200 MG-UNIT per tablet Take 2 tablets by mouth 2 (two) times daily.     carbamide peroxide (DEBROX) 6.5 % OTIC solution Place 5 drops into both ears 2 (two) times daily. 15 mL 0   cetirizine (ZYRTEC ALLERGY) 10 MG tablet Take 1 tablet (10 mg total) by mouth daily.     clotrimazole (MYCELEX) 10 MG troche SMARTSIG:1 Lozenge(s) By Mouth 4 Times Daily     Cranberry Extract 250 MG TABS Take 1 capsule by mouth at bedtime.      diazepam (VALIUM) 5 MG tablet Place 1 tablet vaginally nightly as needed for muscle spasm/ pelvic pain. 30 tablet 0   dorzolamide-timolol (COSOPT) 22.3-6.8 MG/ML ophthalmic solution SMARTSIG:In Eye(s)     fluconazole (DIFLUCAN) 150 MG tablet Take 150 mg by mouth once.     guaiFENesin (MUCINEX) 600 MG 12 hr tablet Take 1 tablet (600 mg total) by mouth 2 (two) times daily as needed.     latanoprost (XALATAN) 0.005 % ophthalmic solution Place 1 drop into both eyes at bedtime. 2.5 mL 1   Multiple Vitamins-Minerals (MULTIVITAMINS THER. W/MINERALS) TABS Take 1 tablet by mouth every morning.     nitrofurantoin, macrocrystal-monohydrate, (MACROBID) 100 MG capsule Take 1 capsule (100 mg total) by mouth daily. 30 capsule 5   omeprazole (PRILOSEC) 20 MG capsule Take 1 capsule (20 mg total) by mouth daily.     SHINGRIX injection      No current facility-administered medications for this visit.    Family History  Problem Relation Age of Onset   Colon cancer Mother    Breast cancer Cousin        on  dads side   Breast cancer Cousin        on moms side   Anesthesia problems Neg Hx    Hypotension Neg Hx    Malignant hyperthermia Neg Hx    Pseudochol deficiency Neg Hx     Social History   Socioeconomic History   Marital status: Married    Spouse name: Doren Custard   Number  of children: 4   Years of education: BS   Highest education level: Not on file  Occupational History   Occupation: Retired  Tobacco Use   Smoking status: Never   Smokeless tobacco: Never  Vaping Use   Vaping Use: Never used  Substance and Sexual Activity   Alcohol use: No   Drug use: No   Sexual activity: Not Currently  Other Topics Concern   Not on file  Social History Narrative   Pt lives at home with spouse.   Caffeine Use: none   Social Determinants of Radio broadcast assistant Strain: Not on file  Food Insecurity: Not on file  Transportation Needs: Not on file  Physical Activity: Not on file  Stress: Not on file  Social Connections: Not on file  Intimate Partner Violence: Not on file     REVIEW OF SYSTEMS:  *** '[X]'$  denotes positive finding, '[ ]'$  denotes negative finding Cardiac  Comments:  Chest pain or chest pressure:    Shortness of breath upon exertion:    Short of breath when lying flat:    Irregular heart rhythm:        Vascular    Pain in calf, thigh, or hip brought on by ambulation:    Pain in feet at night that wakes you up from your sleep:     Blood clot in your veins:    Leg swelling:         Pulmonary    Oxygen at home:    Productive cough:     Wheezing:         Neurologic    Sudden weakness in arms or legs:     Sudden numbness in arms or legs:     Sudden onset of difficulty speaking or slurred speech:    Temporary loss of vision in one eye:     Problems with dizziness:         Gastrointestinal    Blood in stool:     Vomited blood:         Genitourinary    Burning when urinating:     Blood in urine:        Psychiatric    Major depression:          Hematologic    Bleeding problems:    Problems with blood clotting too easily:        Skin    Rashes or ulcers:        Constitutional    Fever or chills:      PHYSICAL EXAMINATION:  ***  General:  WDWN in NAD; vital signs documented above Gait: Not observed HENT: WNL, normocephalic Pulmonary: normal non-labored breathing Cardiac: {Desc; regular/irreg:14544} HR, {With/Without:20273} carotid bruit*** Abdomen: soft, NT; aortic pulse is *** palpable Skin: {With/Without:20273} rashes Vascular Exam/Pulses:  Right Left  Radial {Exam; arterial pulse strength 0-4:30167} {Exam; arterial pulse strength 0-4:30167}  Popliteal {Exam; arterial pulse strength 0-4:30167} {Exam; arterial pulse strength 0-4:30167}  DP {Exam; arterial pulse strength 0-4:30167} {Exam; arterial pulse strength 0-4:30167}  PT {Exam; arterial pulse strength 0-4:30167} {Exam; arterial pulse strength 0-4:30167}   Extremities: {With/Without:20273} ischemic changes, {With/Without:20273} Gangrene , {With/Without:20273} cellulitis; {With/Without:20273} open wounds Musculoskeletal: no muscle wasting or atrophy  Neurologic: A&O X 3; moving all extremities equally; speech is fluent/normal Psychiatric:  The pt has {Desc; normal/abnormal:11317::"Normal"} affect.   Non-Invasive Vascular Imaging:   Carotid Duplex on ***: Right:  ***% ICA stenosis Left:  ***% ICA stenosis ***  Previous Carotid duplex on ***:  Right: ***% ICA stenosis Left:   ***% ICA stenosis    ASSESSMENT/PLAN:: 76 y.o. female here for follow up carotid artery stenosis    -duplex today reveals *** -discussed s/s of stroke with pt and ***he understands should ***he develop any of these sx, ***he will go to the nearest ER or call 911. -pt will f/u in *** with carotid duplex -pt will call sooner should they have any issues. -continue statin/asa   Leontine Locket, [facility] [phone]  Clinic MD:  Carlis Abbott

## 2022-01-22 ENCOUNTER — Encounter: Payer: Self-pay | Admitting: Physician Assistant

## 2022-01-22 ENCOUNTER — Ambulatory Visit: Payer: Medicare Other | Admitting: Physician Assistant

## 2022-01-22 ENCOUNTER — Ambulatory Visit (HOSPITAL_COMMUNITY)
Admission: RE | Admit: 2022-01-22 | Discharge: 2022-01-22 | Disposition: A | Discharge status: Home / Self Care | Payer: Medicare Other | Source: Ambulatory Visit | Attending: Vascular Surgery | Admitting: Vascular Surgery

## 2022-01-22 ENCOUNTER — Other Ambulatory Visit: Payer: Self-pay

## 2022-01-22 VITALS — BP 116/69 | HR 68 | Temp 97.7°F | Resp 18 | Ht 64.0 in | Wt 115.0 lb

## 2022-01-22 DIAGNOSIS — I6523 Occlusion and stenosis of bilateral carotid arteries: Secondary | ICD-10-CM | POA: Diagnosis not present

## 2022-01-28 ENCOUNTER — Other Ambulatory Visit: Payer: Self-pay | Admitting: *Deleted

## 2022-01-28 ENCOUNTER — Ambulatory Visit
Admission: RE | Admit: 2022-01-28 | Discharge: 2022-01-28 | Disposition: A | Discharge status: Home / Self Care | Payer: Medicare Other | Source: Ambulatory Visit | Attending: Hematology and Oncology | Admitting: Hematology and Oncology

## 2022-01-28 ENCOUNTER — Ambulatory Visit: Payer: Medicare Other

## 2022-01-28 ENCOUNTER — Other Ambulatory Visit: Payer: Self-pay

## 2022-01-28 DIAGNOSIS — Z1231 Encounter for screening mammogram for malignant neoplasm of breast: Secondary | ICD-10-CM

## 2022-01-28 DIAGNOSIS — I6523 Occlusion and stenosis of bilateral carotid arteries: Secondary | ICD-10-CM

## 2022-02-07 ENCOUNTER — Encounter: Payer: Medicare Other | Admitting: Physical Therapy

## 2022-02-07 ENCOUNTER — Encounter: Payer: Self-pay | Admitting: Physical Therapy

## 2022-02-07 DIAGNOSIS — M6281 Muscle weakness (generalized): Secondary | ICD-10-CM | POA: Diagnosis not present

## 2022-02-07 DIAGNOSIS — R252 Cramp and spasm: Secondary | ICD-10-CM

## 2022-02-07 NOTE — Therapy (Signed)
[place] ?[facility] at [facility] ?[address], [address] ?[place], [place], [address] ?Phone: [phone]   Fax:  [phone] ? ?Physical Therapy Treatment ? ?Patient Details  ?Name: Barbara Rangel ?MRN: [id number] ?Date of Birth: 12/10/1945 ?Referring Provider (PT): Dr. Sherlene Shams ? ? ?Encounter Date: 02/07/2022 ? ? PT End of Session - 02/07/22 1514   ? ? Visit Number 9   ? Date for PT Re-Evaluation 04/12/22   ? Authorization Type UHC medicare   ? Authorization - Visit Number 9   ? Authorization - Number of Visits 10   ? PT Start Time 1510   ? PT Stop Time 4650   ? PT Time Calculation (min) 45 min   ? Activity Tolerance Patient tolerated treatment well   ? Behavior During Therapy [facility] for tasks assessed/performed   ? ?  ?  ? ?  ? ? ?Past Medical History:  ?Diagnosis Date  ? Arthritis   ? degenerative arthritis  ? Breast cancer, right breast ([place]) 04/2004  ? T2N1 diagnosed June 2005, ER PR + and Her 2 negative, post mastectomy with axillary node dissection (possibly 9 or 11 nodes removed, with possibly 7 or 9 involved) then treatment on NSABP B28 with dose dense adriamycin cytoxan followed by taxol, all of this Rx  in [place]. Adjuvant arimidex begun Jan 2006. No radiation.   ? Chronic back pain   ? right radicular leg pain  ? Chronic urinary tract infection   ? takes Macrodantin and Cranberry daily  ? Constipation   ? r/t pain meds and takes Dulcolax nightly  ? Glaucoma   ? uses Xalantan nightly  ? Hypertension   ? takes Amlodipine daily  ? Hypokalemia 09/15/2012  ? 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/24/2015  ? Other and unspecified general anesthetics causing adverse effect in therapeutic use   ? hard to wake up  ? PONV (postoperative nausea and vomiting)   ? Syncope 09/15/2012  ? ? ?Past Surgical History:  ?Procedure Laterality Date  ? ANKLE SURGERY  2011  ? left ankle with screws and plates  ? BREAST LUMPECTOMY    ? x2  ? cataract surgery  2005  ? right eye  ?  COLONOSCOPY    ? COLONOSCOPY WITH PROPOFOL N/A 10/11/2014  ? Procedure: COLONOSCOPY WITH PROPOFOL;  Surgeon: Garlan Fair, MD;  Location: [facility];  Service: Endoscopy;  Laterality: N/A;  ? EYE SURGERY  2004  ? right eye-detached retina  ? left breast biopsy  2009  ? LUMBAR LAMINECTOMY/DECOMPRESSION MICRODISCECTOMY  10/31/2011  ? Procedure: LUMBAR LAMINECTOMY/DECOMPRESSION MICRODISCECTOMY;  Surgeon: Dahlia Bailiff;  Location: [place];  Service: Orthopedics;  Laterality: Right;  L4-5 Decompression with Right Facet Decompression   ? MASTECTOMY  2005  ? right  d/t breast cancer;no sticks to right arm LYMPH NODES REMOVED.  ? RETINAL DETACHMENT SURGERY  2004  ? right breast biopsy  2005  ? TUBAL LIGATION  1980  ? ? ?There were no vitals filed for this visit. ? ? Subjective Assessment - 02/07/22 1514   ? ? Subjective I have not gotten the wand yet due to not sure which one to get.   ? Patient Stated Goals goal is to empty her bladder fully   ? Currently in Pain? No/denies   ? Multiple Pain Sites No   ? ?  ?  ? ?  ? ? ? ? ? ? ? ? ? ? ? ? ? ? ? ? ? Pelvic  Floor Special Questions - 02/07/22 0001   ? ? Pelvic Floor Internal Exam Patient confirms identification and approves PT to assess pelvic floor and treatment   ? Exam Type Vaginal   ? Strength fair squeeze, definite lift   ? ?  ?  ? ?  ? ? ? ? [place] Adult PT Treatment/Exercise - 02/07/22 0001   ? ?  ? Self-Care  ? Self-Care Other Self-Care Comments   ? Other Self-Care Comments  educated patient on using the vaginal wand for trigger point work and how the lubricant can help, educated patient on which wand right.   ?  ? Manual Therapy  ? Manual Therapy Internal Pelvic Floor   ? Internal Pelvic Floor performed techniques on both side, released around both side of the urethera and bladder with one finger on each side, release of the levator ani,along the baldder, anterior vaginal canal superioer to the bladder   ? ?  ?  ? ?  ? ? ? ? ? ? ? ? ? ? PT Education - 02/07/22  1553   ? ? Education Details further eduation on the vaginal wand   ? Person(s) Educated Patient   ? Methods Explanation   ? Comprehension Verbalized understanding   ? ?  ?  ? ?  ? ? ? PT Short Term Goals - 01/10/22 1507   ? ?  ? PT SHORT TERM GOAL #1  ? Title ind with initial HEP   ? Time 4   ? Period Weeks   ? Status Achieved   ? Target Date 11/22/21   ?  ? PT SHORT TERM GOAL #2  ? Title education on vaginal moisturizers to reduce dryness   ? Time 4   ? Period Weeks   ? Status Achieved   ? Target Date 11/22/21   ? ?  ?  ? ?  ? ? ? ? PT Long Term Goals - 02/07/22 1555   ? ?  ? PT LONG TERM GOAL #1  ? Title Pt will be ind with final HEP for elongation of muscles   ? Time 12   ? Period Weeks   ? Status On-going   ? Target Date 01/17/22   ?  ? PT LONG TERM GOAL #2  ? Title abiltiy to expand her lower rib cage for diaphragmatic breathing to elongate the pelvic floor   ? Time 12   ? Period Weeks   ? Status Achieved   ?  ? PT LONG TERM GOAL #3  ? Title ability to fully empty her bladder so she is able to stop catherizing herself   ? Baseline down to 2 times per day   ? Time 12   ? Period Weeks   ? Status On-going   ? Target Date 01/17/22   ?  ? PT LONG TERM GOAL #4  ? Title pelvic floor strength >/= 3/5 due to the abilty to fully relax her pelvic floor then fully contract   ? Time 12   ? Period Weeks   ? Status Achieved   ? ?  ?  ? ?  ? ? ? ? ? ? ? ? Plan - 02/07/22 1527   ? ? Clinical Impression Statement Patient is catherizing herself 2 times per day. Pelvic floor muscles were tight initially but after wards were very soft. She ahd some thickness on the posterior bladder like a ridge. Pelvic floor strength is 3/5. She will be ordering the vaginal wand. Patient  will benefit from skilled therapy to improve emptying her bladder and elongation of the pelvic floor muscles.   ? Personal Factors and Comorbidities Comorbidity 3+   ? Comorbidities Right breast cancer 04/2004; Mastectomy 2005; Lumbar laminectomy 10/2011;  Glaucoma; Osteopenia   ? Examination-Activity Limitations Continence   ? Stability/Clinical Decision Making Stable/Uncomplicated   ? Rehab Potential Excellent   ? PT Frequency 1x / week   ? PT Duration 12 weeks   ? PT Treatment/Interventions Biofeedback;Therapeutic activities;Therapeutic exercise;Neuromuscular re-education;Patient/family education;Manual techniques;Dry needling;Taping   ? PT Next Visit Plan educate on using the vaginal wand, work on the pelvic floor muscles in sidely   ? PT Home Exercise Plan Access Code: [email or web address]   ? Consulted and Agree with Plan of Care Patient   ? ?  ?  ? ?  ? ? ?Patient will benefit from skilled therapeutic intervention in order to improve the following deficits and impairments:  Increased fascial restricitons, Decreased strength, Decreased coordination ? ?Visit Diagnosis: ?Cramp and spasm ? ?Muscle weakness (generalized) ? ? ? ? ?Problem List ?Patient Active Problem List  ? Diagnosis Date Noted  ? Carotid artery stenosis 01/05/2020  ? Neck pain 03/30/2018  ? Osteopenia determined by x-ray 08/28/2016  ? History of right breast cancer 08/28/2016  ? Malignant neoplasm of right female breast ([place]) 01/23/2016  ? History of chemotherapy 01/23/2016  ? Estrogen deficiency 07/29/2015  ? 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/24/2015  ? Dense breast tissue 01/24/2015  ? Hx of adenomatous colonic polyps 01/24/2015  ? Plantar fasciitis, bilateral 01/24/2015  ? Breast cancer, right breast ([place]) 07/15/2013  ? Syncope 09/15/2012  ? Hypokalemia 09/15/2012  ? Chronic back pain   ? Chronic urinary tract infection   ? Hypertension   ? Arthritis   ? Synovial cyst of lumbar facet joint 11/01/2011  ? ? ?Earlie Counts, PT ?02/07/22 3:57 PM ? ?[place] ?[facility]patient Rehabilitation at [facility] ?[address], [address] ?[place], [place], [address] ?Phone: [phone]   Fax:  [phone] ? ?Name: Barbara Rangel ?MRN: [id number] ?Date of Birth: 1946/01/20 ? ? ? ?

## 2022-02-14 ENCOUNTER — Encounter: Payer: Medicare Other | Attending: Obstetrics and Gynecology | Admitting: Physical Therapy

## 2022-02-14 ENCOUNTER — Encounter: Payer: Self-pay | Admitting: Physical Therapy

## 2022-02-14 DIAGNOSIS — R252 Cramp and spasm: Secondary | ICD-10-CM

## 2022-02-14 DIAGNOSIS — R339 Retention of urine, unspecified: Secondary | ICD-10-CM | POA: Diagnosis not present

## 2022-02-14 DIAGNOSIS — M6281 Muscle weakness (generalized): Secondary | ICD-10-CM

## 2022-02-14 NOTE — Therapy (Signed)
[place] ?Outpatient Rehabilitation at [facility] ?[address], [address] ?[place], [place], [address] ?Phone: [phone]   Fax:  [phone] ? ?Physical Therapy Treatment ? ?Patient Details  ?Name: Barbara Rangel ?MRN: [id number] ?Date of Birth: [birth year] ?Referring Provider (PT): Dr. Sherlene Shams ?Progress Note ?Reporting Period 10/25/2021 to 02/14/2022 ? ?See note below for Objective Data and Assessment of Progress/Goals.  ? ?  ? ?Encounter Date: 02/14/2022 ? ? PT End of Session - 02/14/22 1034   ? ? Visit Number 10   ? Date for PT Re-Evaluation 04/12/22   ? Authorization Type UHC medicare   ? Authorization - Visit Number 10   ? Authorization - Number of Visits 10   ? PT Start Time 1030   ? PT Stop Time 1115   ? PT Time Calculation (min) 45 min   ? Activity Tolerance Patient tolerated treatment well   ? Behavior During Therapy [facility] for tasks assessed/performed   ? ?  ?  ? ?  ? ? ?Past Medical History:  ?Diagnosis Date  ? Arthritis   ? degenerative arthritis  ? Breast cancer, right breast ([place]) 04/2004  ? T2N1 diagnosed June 2005, ER PR + and Her 2 negative, post mastectomy with axillary node dissection (possibly 9 or 11 nodes removed, with possibly 7 or 9 involved) then treatment on NSABP B28 with dose dense adriamycin cytoxan followed by taxol, all of this Rx  in [place]. Adjuvant arimidex begun Jan 2006. No radiation.   ? Chronic back pain   ? right radicular leg pain  ? Chronic urinary tract infection   ? takes Macrodantin and Cranberry daily  ? Constipation   ? r/t pain meds and takes Dulcolax nightly  ? Glaucoma   ? uses Xalantan nightly  ? Hypertension   ? takes Amlodipine daily  ? Hypokalemia 09/15/2012  ? 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/24/2015  ? Other and unspecified general anesthetics causing adverse effect in therapeutic use   ? hard to wake up  ? PONV (postoperative nausea and vomiting)   ? Syncope 09/15/2012  ? ? ?Past Surgical History:  ?Procedure Laterality Date  ?  ANKLE SURGERY  2011  ? left ankle with screws and plates  ? BREAST LUMPECTOMY    ? x2  ? cataract surgery  2005  ? right eye  ? COLONOSCOPY    ? COLONOSCOPY WITH PROPOFOL N/A 10/11/2014  ? Procedure: COLONOSCOPY WITH PROPOFOL;  Surgeon: Garlan Fair, MD;  Location: [facility];  Service: Endoscopy;  Laterality: N/A;  ? EYE SURGERY  2004  ? right eye-detached retina  ? left breast biopsy  2009  ? LUMBAR LAMINECTOMY/DECOMPRESSION MICRODISCECTOMY  10/31/2011  ? Procedure: LUMBAR LAMINECTOMY/DECOMPRESSION MICRODISCECTOMY;  Surgeon: Dahlia Bailiff;  Location: [place];  Service: Orthopedics;  Laterality: Right;  L4-5 Decompression with Right Facet Decompression   ? MASTECTOMY  2005  ? right  d/t breast cancer;no sticks to right arm LYMPH NODES REMOVED.  ? RETINAL DETACHMENT SURGERY  2004  ? right breast biopsy  2005  ? TUBAL LIGATION  1980  ? ? ?There were no vitals filed for this visit. ? ? Subjective Assessment - 02/14/22 1035   ? ? Subjective I ordered the wand but it is not in yet. No changes with emptying bladder sicne last time.   ? Patient Stated Goals goal is to empty her bladder fully   ? Currently in Pain? No/denies   ? Multiple Pain Sites No   ? ?  ?  ? ?  ? ? ? ? ?  [facility] PT Assessment - 02/14/22 0001   ? ?  ? Assessment  ? Medical Diagnosis N33.9 Incomplete bladder emptying   ? Referring Provider (PT) Dr. Sherlene Shams   ? Prior Therapy yes in the past   ?  ? Precautions  ? Precautions Other (comment)   ? Precaution Comments breast cancer   ?  ? Restrictions  ? Weight Bearing Restrictions No   ?  ? Home Environment  ? Living Environment Private residence   ?  ? Prior Function  ? Level of Independence Independent   ? Leisure when pandemic came she reduced her exercise   ?  ? Cognition  ? Overall Cognitive Status Within Functional Limits for tasks assessed   ?  ? Posture/Postural Control  ? Posture/Postural Control No significant limitations   ?  ? AROM  ? Overall AROM Comments lumbar ROM is full   ?  ?  Strength  ? Right Hip Flexion 4/5   ? Right Hip Extension 4-/5   ? Right Hip ABduction 4-/5   ? Left Hip Flexion 4/5   ? Left Hip Extension 4-/5   ? Left Hip ABduction 4/5   ? ?  ?  ? ?  ? ? ? ? ? ? ? ? ? ? ? ? ? Pelvic Floor Special Questions - 02/14/22 0001   ? ? Urinary Leakage No   ? Fecal incontinence No   ? Pelvic Floor Internal Exam Patient confirms identification and approves PT to assess pelvic floor and treatment   ? Exam Type Vaginal   ? Strength fair squeeze, definite lift   able to fully relax after contraction  ? ?  ?  ? ?  ? ? ? ? [place] Adult PT Treatment/Exercise - 02/14/22 0001   ? ?  ? Self-Care  ? Self-Care Other Self-Care Comments   ? Other Self-Care Comments  discussed with patient on using vaginal moisturizer daily on the external gentalia to reduce dryness   ?  ? Lumbar Exercises: Stretches  ? Active Hamstring Stretch Right;Left;1 rep;30 seconds   ? Active Hamstring Stretch Limitations with strap   ? ITB Stretch Right;Left;1 rep;30 seconds   ? ITB Stretch Limitations supine with strap   ?  ? Manual Therapy  ? Manual Therapy Internal Pelvic Floor   ? Internal Pelvic Floor performed internal work on the perineal body an dichiocavernosus with one finger internally and other externally, manual release on the sides of the bladder with one finger internal and other external, fascial releas on the urethra, manual work on bilateral levator ani in right sidely   ? ?  ?  ? ?  ? ? ? ? ? ? ? ? ? ? PT Education - 02/14/22 1109   ? ? Education Details educated patient on using vitamin E on the external genetalia daily   ? Person(s) Educated Patient   ? Methods Explanation   ? Comprehension Verbalized understanding   ? ?  ?  ? ?  ? ? ? PT Short Term Goals - 02/14/22 1036   ? ?  ? PT SHORT TERM GOAL #1  ? Title ind with initial HEP   ? Time 4   ? Period Weeks   ? Status Achieved   ? Target Date 11/22/21   ?  ? PT SHORT TERM GOAL #2  ? Title education on vaginal moisturizers to reduce dryness   ? Time 4   ?  Period Weeks   ? Status Achieved   ?  Target Date 11/22/21   ? ?  ?  ? ?  ? ? ? ? PT Long Term Goals - 02/14/22 1036   ? ?  ? PT LONG TERM GOAL #1  ? Title Pt will be ind with final HEP for elongation of muscles   ? Time 12   ? Period Weeks   ? Status On-going   ? Target Date 01/17/22   ?  ? PT LONG TERM GOAL #2  ? Title abiltiy to expand her lower rib cage for diaphragmatic breathing to elongate the pelvic floor   ? Time 12   ? Period Weeks   ? Status Achieved   ? Target Date 01/17/22   ?  ? PT LONG TERM GOAL #3  ? Title ability to fully empty her bladder so she is able to stop catherizing herself   ? Baseline down to 3 times per day, tried 2 times but had 6 ounces with catherization   ? Time 12   ? Period Weeks   ? Status On-going   ? Target Date 01/17/22   ?  ? PT LONG TERM GOAL #4  ? Title pelvic floor strength >/= 3/5 due to the abilty to fully relax her pelvic floor then fully contract   ? Time 12   ? Period Weeks   ? Status Achieved   ? ?  ?  ? ?  ? ? ? ? ? ? ? ? Plan - 02/14/22 1040   ? ? Clinical Impression Statement Patient is catherizing hersefl 3 times per day due to 2 times per day had 6-7 ounces still in her bladder. Patient able perform diaphragmatic breathing to expand her pelvic floor. She has oredered the vaginal wand to do her own manual work to the pelvic floor. Pelvic floor strength is 3/5 and she is able to fully relax her pelvic floor . Patient is able to have a better urine flow after she uses the vitamin E suppository. Patient is using vaginal moisturizers for vaginal health. Patinet continues to do yoga 1 time per day. Patient will benefit from skilled therapy to improve emptying her bladder and elongation of the pelvic floor muscles.   ? Personal Factors and Comorbidities Comorbidity 3+   ? Comorbidities Right breast cancer 04/2004; Mastectomy 2005; Lumbar laminectomy 10/2011; Glaucoma; Osteopenia   ? Stability/Clinical Decision Making Stable/Uncomplicated   ? Rehab Potential Excellent   ?  PT Frequency 1x / week   ? PT Duration 12 weeks   ? PT Treatment/Interventions Biofeedback;Therapeutic activities;Therapeutic exercise;Neuromuscular re-education;Patient/family education;Manual techniques;Dry

## 2022-02-20 ENCOUNTER — Encounter: Payer: Self-pay | Admitting: Obstetrics and Gynecology

## 2022-02-20 ENCOUNTER — Ambulatory Visit: Payer: Medicare Other | Admitting: Obstetrics and Gynecology

## 2022-02-20 VITALS — BP 116/77 | HR 77

## 2022-02-20 DIAGNOSIS — N390 Urinary tract infection, site not specified: Secondary | ICD-10-CM

## 2022-02-20 DIAGNOSIS — R339 Retention of urine, unspecified: Secondary | ICD-10-CM | POA: Diagnosis not present

## 2022-02-20 MED ORDER — NITROFURANTOIN MONOHYD MACRO 100 MG PO CAPS
100.0000 mg | ORAL_CAPSULE | Freq: Two times a day (BID) | ORAL | 0 refills | Status: AC
Start: 2022-02-20 — End: 2022-02-25

## 2022-02-20 NOTE — Progress Notes (Signed)
[facility] ?Return Visit ? ?SUBJECTIVE  ?History of Present Illness: ?Barbara Rangel is a 76 y.o. female seen in follow-up after incomplete bladder emptying/ levator spasm.   ? ?Down to one cath a day. Usually 2-3 oz. She is double voiding and catheterizing to monitor volumes. She is voiding approximately 6-7 times per day and will go between 2-4 hours between voids.  ? ?Currently on nitrofurantoin daily and does not have any symptoms of UTI.  ? ? ?Urodynamic Impression:  ?1. Sensation was normal; capacity was normal ?2. Stress Incontinence was not demonstrated. ?3. Detrusor Overactivity was not demonstrated. ?4. Emptying was dysfunctional with a elevated PVR, a sustained detrusor contraction present,  abdominal straining not present, dyssynergic urethral sphincter activity on EMG. ? ?Urodynamics indicative of obstructive voiding pattern.  ? ?Past Medical History: ?Patient  has a past medical history of Arthritis, Breast cancer, right breast ([place]) (04/2004), Chronic back pain, Chronic urinary tract infection, Constipation, Glaucoma, Hypertension, Hypokalemia (09/15/2012), Osteopenia due to cancer therapy (01/24/2015), Other and unspecified general anesthetics causing adverse effect in therapeutic use, PONV (postoperative nausea and vomiting), and Syncope (09/15/2012).  ? ?Past Surgical History: ?She  has a past surgical history that includes Tubal ligation (1980); Eye surgery (2004); cataract surgery (2005); right breast biopsy (2005); left breast biopsy (2009); Ankle surgery (2011); Colonoscopy; Retinal detachment surgery (2004); Mastectomy (2005); Lumbar laminectomy/decompression microdiscectomy (10/31/2011); Colonoscopy with propofol (N/A, 10/11/2014); and Breast lumpectomy.  ? ?Medications: ?She has a current medication list which includes the following prescription(s): acetaminophen, amlodipine, aspirin ec, atorvastatin, calcium citrate-vitamin d, debrox, cetirizine, clotrimazole, cranberry  extract, cyclobenzaprine, diazepam, dorzolamide-timolol, fluconazole, guaifenesin, latanoprost, multivitamins ther. w/minerals, nitrofurantoin (macrocrystal-monohydrate), nitrofurantoin (macrocrystal-monohydrate), omeprazole, sertraline, and shingrix.  ? ?Allergies: ?Patient is allergic to nitrous oxide, avelox [moxifloxacin hcl in nacl], cephalexin, other, quinolones, tramadol, trimethoprim, tizanidine hcl, and citalopram hydrobromide.  ? ?Social History: ?Patient  reports that she has never smoked. She has never used smokeless tobacco. She reports that she does not drink alcohol and does not use drugs.  ?  ?  ?OBJECTIVE  ?  ? ?Physical Exam: ?Vitals:  ? 02/20/22 1118  ?BP: 116/77  ?Pulse: 77  ? ? ?Gen: No apparent distress, A&O x 3. ? ?Detailed Urogynecologic Evaluation:  ?Deferred. Prior exam showed: ?Pelvic floor musculature: Right levator tender, Right obturator tender, Left levator non-tender, Left obturator tender ?  ?POP-Q:  ?  ?POP-Q ?  ?-1.5  ?                                          Aa   ?-1.5 ?                                          Ba   ?-6  ?                                            C  ?  ?2  ?                                          Gh   ?  3  ?                                          Pb   ?8  ?                                          tvl  ?  ?-2  ?                                          Ap   ?-2  ?                                          Bp   ?-8  ?                                            D  ?  ?  ?  ? ?ASSESSMENT AND PLAN  ?  ?Barbara Rangel is a 76 y.o. with:  ?1. Urinary tract infection without hematuria, site unspecified   ? ? ?Recurrent uti ?- Currently on nitrofurantoin prophylaxis daily. We will stop after 6 months and reassess need ?- She will discuss with her oncologist regarding vaginal estrogen for prevention. We reviewed that research supports that vaginal preparations do not increase risk of recurrent breast cancer.  ?- Prescribed a treatment course of nitrofurantoin in case she  develops symptoms while traveling.  ? ?2. Incomplete emptying/ levator spasm ?- Improved with physical therapy and double voiding ?- Should no longer catheterize since her PVRs have been within normal range.  ? ?Return 2 months for follow up ? ?[name], MD ? ?Time spent: I spent 25 minutes dedicated to the care of this patient on the date of this encounter to include pre-visit review of records, face-to-face time with the patient and post visit documentation and ordering medication/ testing.  ? ? ?

## 2022-02-21 ENCOUNTER — Ambulatory Visit: Payer: Self-pay | Admitting: Physical Therapy

## 2022-02-21 ENCOUNTER — Encounter: Payer: Self-pay | Admitting: Physical Therapy

## 2022-02-21 DIAGNOSIS — R339 Retention of urine, unspecified: Secondary | ICD-10-CM | POA: Diagnosis not present

## 2022-02-21 DIAGNOSIS — M6281 Muscle weakness (generalized): Secondary | ICD-10-CM

## 2022-02-21 DIAGNOSIS — R252 Cramp and spasm: Secondary | ICD-10-CM

## 2022-02-21 NOTE — Therapy (Signed)
[place] ?Outpatient Rehabilitation at [facility] ?[address], [address] ?[place], [place], [address] ?Phone: [phone]   Fax:  [phone] ? ?Physical Therapy Treatment ? ?Patient Details  ?Name: Barbara Rangel ?MRN: [id number] ?Date of Birth: May 04, 1946 ?Referring Provider (PT): Dr. Sherlene Shams ? ? ?Encounter Date: 02/21/2022 ? ? PT End of Session - 02/21/22 1038   ? ? Visit Number 11   ? Date for PT Re-Evaluation 04/12/22   ? Authorization Type UHC medicare   ? Authorization - Visit Number 11   ? Authorization - Number of Visits 20   ? PT Start Time 1030   ? PT Stop Time 1125   ? PT Time Calculation (min) 55 min   ? Activity Tolerance Patient tolerated treatment well   ? Behavior During Therapy [facility] for tasks assessed/performed   ? ?  ?  ? ?  ? ? ?Past Medical History:  ?Diagnosis Date  ? Arthritis   ? degenerative arthritis  ? Breast cancer, right breast ([place]) 04/2004  ? T2N1 diagnosed June 2005, ER PR + and Her 2 negative, post mastectomy with axillary node dissection (possibly 9 or 11 nodes removed, with possibly 7 or 9 involved) then treatment on NSABP B28 with dose dense adriamycin cytoxan followed by taxol, all of this Rx  in [place]. Adjuvant arimidex begun Jan 2006. No radiation.   ? Chronic back pain   ? right radicular leg pain  ? Chronic urinary tract infection   ? takes Macrodantin and Cranberry daily  ? Constipation   ? r/t pain meds and takes Dulcolax nightly  ? Glaucoma   ? uses Xalantan nightly  ? Hypertension   ? takes Amlodipine daily  ? Hypokalemia 09/15/2012  ? 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/24/2015  ? Other and unspecified general anesthetics causing adverse effect in therapeutic use   ? hard to wake up  ? PONV (postoperative nausea and vomiting)   ? Syncope 09/15/2012  ? ? ?Past Surgical History:  ?Procedure Laterality Date  ? ANKLE SURGERY  2011  ? left ankle with screws and plates  ? BREAST LUMPECTOMY    ? x2  ? cataract surgery  2005  ? right eye  ?  COLONOSCOPY    ? COLONOSCOPY WITH PROPOFOL N/A 10/11/2014  ? Procedure: COLONOSCOPY WITH PROPOFOL;  Surgeon: Garlan Fair, MD;  Location: [facility];  Service: Endoscopy;  Laterality: N/A;  ? EYE SURGERY  2004  ? right eye-detached retina  ? left breast biopsy  2009  ? LUMBAR LAMINECTOMY/DECOMPRESSION MICRODISCECTOMY  10/31/2011  ? Procedure: LUMBAR LAMINECTOMY/DECOMPRESSION MICRODISCECTOMY;  Surgeon: Dahlia Bailiff;  Location: [place];  Service: Orthopedics;  Laterality: Right;  L4-5 Decompression with Right Facet Decompression   ? MASTECTOMY  2005  ? right  d/t breast cancer;no sticks to right arm LYMPH NODES REMOVED.  ? RETINAL DETACHMENT SURGERY  2004  ? right breast biopsy  2005  ? TUBAL LIGATION  1980  ? ? ?There were no vitals filed for this visit. ? ? Subjective Assessment - 02/21/22 1038   ? ? Subjective MD said I can stop with the catherization. I have brought the wand.   ? Patient Stated Goals goal is to empty her bladder fully   ? Currently in Pain? No/denies   ? Multiple Pain Sites No   ? ?  ?  ? ?  ? ? ? ? ? ? ? ? ? ? ? ? ? ? ? ? ? Pelvic Floor Special  Questions - 02/21/22 0001   ? ? Pelvic Floor Internal Exam Patient confirms identification and approves PT to assess pelvic floor and treatment   ? Exam Type Vaginal   ? Strength fair squeeze, definite lift   ? ?  ?  ? ?  ? ? ? ? [place] Adult PT Treatment/Exercise - 02/21/22 0001   ? ?  ? Self-Care  ? Self-Care Other Self-Care Comments   ? Other Self-Care Comments  educated pateint on using the vagianl wand, how to insert into the vagianl canal and move the tool to massage the muscles, how to charge the device and use the massage component. She was abel to demonstrate correctly   ?  ? Manual Therapy  ? Manual Therapy Internal Pelvic Floor   ? Internal Pelvic Floor performed internal work on the perineal body and ischiocavernosus with one finger internally and other externally, , fascial releas on the urethra, manual work on bilateral levator ani in  right sidely then left sidely   ? ?  ?  ? ?  ? ? ? ? ? ? ? ? ? ? PT Education - 02/21/22 1121   ? ? Education Details educated patient on how to use the vaginal wand internally   ? Person(s) Educated Patient   ? Methods Explanation;Demonstration   ? Comprehension Verbalized understanding;Returned demonstration   ? ?  ?  ? ?  ? ? ? PT Short Term Goals - 02/14/22 1036   ? ?  ? PT SHORT TERM GOAL #1  ? Title ind with initial HEP   ? Time 4   ? Period Weeks   ? Status Achieved   ? Target Date 11/22/21   ?  ? PT SHORT TERM GOAL #2  ? Title education on vaginal moisturizers to reduce dryness   ? Time 4   ? Period Weeks   ? Status Achieved   ? Target Date 11/22/21   ? ?  ?  ? ?  ? ? ? ? PT Long Term Goals - 02/21/22 1041   ? ?  ? PT LONG TERM GOAL #1  ? Title Pt will be ind with final HEP for elongation of muscles   ? Time 12   ? Period Weeks   ? Status On-going   ? Target Date 01/17/22   ?  ? PT LONG TERM GOAL #2  ? Title abiltiy to expand her lower rib cage for diaphragmatic breathing to elongate the pelvic floor   ? Time 12   ? Period Weeks   ? Status Achieved   ? Target Date 01/17/22   ?  ? PT LONG TERM GOAL #3  ? Title ability to fully empty her bladder so she is able to stop catherizing herself   ? Baseline stopped catherization today   ? Time 12   ? Period Weeks   ? Status On-going   ? Target Date 01/17/22   ?  ? PT LONG TERM GOAL #4  ? Title pelvic floor strength >/= 3/5 due to the abilty to fully relax her pelvic floor then fully contract   ? Time 12   ? Period Weeks   ? Status Achieved   ? ?  ?  ? ?  ? ? ? ? ? ? ? ? Plan - 02/21/22 1045   ? ? Clinical Impression Statement Pateint has stopped catherizing herself and will see how she does. Patient brought in the vaginal wand and was educated on how to  use it. Patients pelvic floor muscles felt softer with manual work. She had some soreness where the aycocks cansl is. Her strength continues to be 3/5. Patient will benefit from skilled therapy to improve emptying her  bladder and elongation of the pelvic floor muscles.   ? Personal Factors and Comorbidities Comorbidity 3+   ? Comorbidities Right breast cancer 04/2004; Mastectomy 2005; Lumbar laminectomy 10/2011; Glaucoma; Osteopenia   ? Examination-Activity Limitations Continence   ? Stability/Clinical Decision Making Stable/Uncomplicated   ? Rehab Potential Excellent   ? PT Frequency 1x / week   ? PT Duration 12 weeks   ? PT Treatment/Interventions Biofeedback;Therapeutic activities;Therapeutic exercise;Neuromuscular re-education;Patient/family education;Manual techniques;Dry needling;Taping   ? PT Next Visit Plan dee how it is going with the wand; manual  work internally   ? PT Home Exercise Plan Access Code: [email or web address]   ? Consulted and Agree with Plan of Care Patient   ? ?  ?  ? ?  ? ? ?Patient will benefit from skilled therapeutic intervention in order to improve the following deficits and impairments:  Increased fascial restricitons, Decreased strength, Decreased coordination ? ?Visit Diagnosis: ?Cramp and spasm ? ?Muscle weakness (generalized) ? ? ? ? ?Problem List ?Patient Active Problem List  ? Diagnosis Date Noted  ? Carotid artery stenosis 01/05/2020  ? Neck pain 03/30/2018  ? Osteopenia determined by x-ray 08/28/2016  ? History of right breast cancer 08/28/2016  ? Malignant neoplasm of right female breast ([place]) 01/23/2016  ? History of chemotherapy 01/23/2016  ? Estrogen deficiency 07/29/2015  ? 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/24/2015  ? Dense breast tissue 01/24/2015  ? Hx of adenomatous colonic polyps 01/24/2015  ? Plantar fasciitis, bilateral 01/24/2015  ? Breast cancer, right breast ([place]) 07/15/2013  ? Syncope 09/15/2012  ? Hypokalemia 09/15/2012  ? Chronic back pain   ? Chronic urinary tract infection   ? Hypertension   ? Arthritis   ? Synovial cyst of lumbar facet joint 11/01/2011  ? ? ?Earlie Counts, PT ?02/21/22 11:24 AM ? ?[place] ?Outpatient Rehabilitation at [facility] ?[address], [address] ?[place], [place], [address] ?Phone: [phone]   Fax:  [phone] ? ?Name: Barbara Rangel ?MRN: [id number] ?Date of Birth: 07-11-1946 ? ? ? ?

## 2022-02-21 NOTE — Progress Notes (Signed)
? ?Patient Care Team: ?Leeroy Cha, MD as PCP - General (Internal Medicine) ?Nicholas Lose, MD as Consulting Physician (Hematology and Oncology) ? ?DIAGNOSIS:  ?Encounter Diagnosis  ?Name Primary?  ? 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, unspecified estrogen receptor status ([place])   ? ? ?SUMMARY OF ONCOLOGIC HISTORY: ?Oncology History  ?Breast cancer, right breast ([place])  ?01/10/2004 Initial Diagnosis  ? Patient found right breast cancer on breast self exam after negative mammogram same year, diagnosis March 2005 in [place] ? ?  ?04/26/2004 Surgery  ? Right mastectomy: T2 N2a M0 ER/PR positive HER-2 negative stage IIIa; 9 lymph nodes were involved (did not get radiation) ? ?  ?05/18/2004 - 09/21/2004 Chemotherapy  ? NSABP B 28 clinical trial with dose dense Adriamycin and Cytoxan followed by Taxol ? ? ?  ?11/12/2004 - 02/26/2019 Anti-estrogen oral therapy  ? Arimidex 1 mg daily ? ?  ? ? ?CHIEF COMPLIANT: Surveillance of breast cancer ? ?INTERVAL HISTORY: Barbara Rangel is a She presents to the clinic today for a follow-up. She state that she have had over active bladder. She states overall she is doing fine. She denies pain and discomfort in breast.  She has been exercising and staying active and reports no pain or discomfort. ? ? ?ALLERGIES:  is allergic to nitrous oxide, avelox [moxifloxacin hcl in nacl], cephalexin, other, quinolones, tramadol, trimethoprim, anesthesia [id number] [propofol], avelox [moxifloxacin], celexa [citalopram], tizanidine hcl, and citalopram hydrobromide. ? ?MEDICATIONS:  ?Current Outpatient Medications  ?Medication Sig Dispense Refill  ? acetaminophen (TYLENOL) 500 MG tablet Take 1,000 mg by mouth at bedtime.    ? amLODipine (NORVASC) 5 MG tablet Take 5 mg by mouth at bedtime.     ? aspirin EC 81 MG tablet Take 81 mg by mouth every morning.     ? atorvastatin (LIPITOR) 20 MG tablet Take 1 tablet by mouth every morning.    ? calcium  citrate-vitamin D (CITRACAL+D) 315-200 MG-UNIT per tablet Take 2 tablets by mouth 2 (two) times daily.    ? carbamide peroxide (DEBROX) 6.5 % OTIC solution Place 5 drops into both ears 2 (two) times daily. 15 mL 0  ? cetirizine (ZYRTEC ALLERGY) 10 MG tablet Take 1 tablet (10 mg total) by mouth daily.    ? clotrimazole (MYCELEX) 10 MG troche SMARTSIG:1 Lozenge(s) By Mouth 4 Times Daily    ? Cranberry Extract 250 MG TABS Take 1 capsule by mouth at bedtime.     ? cyclobenzaprine (FLEXERIL) 5 MG tablet Take 5 mg by mouth as needed.    ? diazepam (VALIUM) 5 MG tablet Place 1 tablet vaginally nightly as needed for muscle spasm/ pelvic pain. 30 tablet 0  ? dorzolamide-timolol (COSOPT) 22.3-6.8 MG/ML ophthalmic solution SMARTSIG:In Eye(s)    ? fluconazole (DIFLUCAN) 150 MG tablet Take 150 mg by mouth once.    ? guaiFENesin (MUCINEX) 600 MG 12 hr tablet Take 1 tablet (600 mg total) by mouth 2 (two) times daily as needed.    ? latanoprost (XALATAN) 0.005 % ophthalmic solution Place 1 drop into both eyes at bedtime. 2.5 mL 1  ? Multiple Vitamins-Minerals (MULTIVITAMINS THER. W/MINERALS) TABS Take 1 tablet by mouth every morning.    ? nitrofurantoin, macrocrystal-monohydrate, (MACROBID) 100 MG capsule Take 1 capsule (100 mg total) by mouth daily. 30 capsule 5  ? omeprazole (PRILOSEC) 20 MG capsule Take 1 capsule (20 mg total) by mouth daily.    ? sertraline (ZOLOFT) 25 MG tablet Take 25 mg by  mouth daily.    ? SHINGRIX injection     ? ?No current facility-administered medications for this visit.  ? ? ?PHYSICAL EXAMINATION: ?ECOG PERFORMANCE STATUS: 1 - Symptomatic but completely ambulatory ? ?Vitals:  ? 03/07/22 1125  ?BP: 132/80  ?Pulse: 78  ?Resp: 18  ?Temp: 97.9 ?F (36.6 ?C)  ?SpO2: 100%  ? ?Filed Weights  ? 03/07/22 1125  ?Weight: 115 lb (52.2 kg)  ? ? ?BREAST: No palpable masses or nodules in either right  breasts.  No lumps or nodules in the left chest wall or axilla.  No palpable axillary supraclavicular or  infraclavicular adenopathy no breast tenderness or nipple discharge. (exam performed in the presence of a chaperone) ? ?LABORATORY DATA:  ?I have reviewed the data as listed ? ?  Latest Ref Rng & Units 08/31/2019  ? 10:18 PM 08/16/2019  ?  1:51 PM 02/24/2017  ?  9:49 AM  ?CMP  ?Glucose 70 - 99 mg/dL 112   86   82    ?BUN 8 - 23 mg/dL 11   12   14.4    ?Creatinine 0.44 - 1.00 mg/dL 0.76   0.78   0.8    ?Sodium 135 - 145 mmol/L 138   142   143    ?Potassium 3.5 - 5.1 mmol/L 3.7   4.2   4.0    ?Chloride 98 - 111 mmol/L 102   103     ?CO2 22 - 32 mmol/L $RemoveB'24   26   28    'rgCruhym$ ?Calcium 8.9 - 10.3 mg/dL 9.9   10.5   10.4    ?Total Protein 6.0 - 8.5 g/dL  7.2   7.0    ?Total Bilirubin 0.0 - 1.2 mg/dL  0.5   0.60    ?Alkaline Phos 39 - 117 IU/L  79   60    ?AST 0 - 40 IU/L  34   27    ?ALT 0 - 32 IU/L  28   26    ? ? ?Lab Results  ?Component Value Date  ? WBC 5.8 08/31/2019  ? HGB 15.4 (H) 08/31/2019  ? HCT 46.3 (H) 08/31/2019  ? MCV 98.3 08/31/2019  ? PLT 220 08/31/2019  ? NEUTROABS 3.0 02/24/2017  ? ? ?ASSESSMENT & PLAN:  ?Breast cancer, right breast ([place]) ?Stage IIIa right breast cancer T2 N2 M0 diagnosed in June 2005 in [place] treated with mastectomy followed by adjuvant chemotherapy. 7 on 9 lymph nodes were apparently involved., Did not get adjuvant radiation (Unknown reasons). ?Arimidex 1 mg daily started January 2006 stopped 02/26/2019 ?  ?Osteopenia: Patient is on weightbearing exercise program.  Bone density 02/17/2020: T score -1.5: Stable ?We will obtain a bone density test next year along with her mammogram. ?  ?Breast cancer surveillance: ?1.  Breast exam  03/07/2022:  Benign. ?2.  Mammogram 01/29/2021: Left breast: No evidence of malignancy breast density category C ?  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 ?Return to clinic in  1 year for follow-up ? ? ? ?No orders of the defined types were placed in this encounter. ? ?The patient has a good understanding  of the overall plan. she agrees with it. she will call with any problems that may develop before the next visit here. ?Total time spent: 30 mins including face to face time and time spent for planning, charting and co-ordination of care ? ? Harriette Ohara, MD ?03/07/22 ? ? ?  I Gardiner Coins am scribing for Dr. Lindi Adie ? ?I have reviewed the above documentation for accuracy and completeness, and I agree with the above. ?  ?

## 2022-02-26 DIAGNOSIS — Z0000 Encounter for general adult medical examination without abnormal findings: Secondary | ICD-10-CM | POA: Diagnosis not present

## 2022-02-26 DIAGNOSIS — Z1389 Encounter for screening for other disorder: Secondary | ICD-10-CM | POA: Diagnosis not present

## 2022-02-26 DIAGNOSIS — G44209 Tension-type headache, unspecified, not intractable: Secondary | ICD-10-CM | POA: Diagnosis not present

## 2022-02-26 DIAGNOSIS — M85859 Other specified disorders of bone density and structure, unspecified thigh: Secondary | ICD-10-CM | POA: Diagnosis not present

## 2022-02-26 DIAGNOSIS — E785 Hyperlipidemia, unspecified: Secondary | ICD-10-CM | POA: Diagnosis not present

## 2022-02-26 DIAGNOSIS — J301 Allergic rhinitis due to pollen: Secondary | ICD-10-CM | POA: Diagnosis not present

## 2022-02-28 ENCOUNTER — Encounter: Payer: Self-pay | Admitting: Physical Therapy

## 2022-02-28 ENCOUNTER — Encounter: Payer: Medicare Other | Admitting: Physical Therapy

## 2022-02-28 DIAGNOSIS — R252 Cramp and spasm: Secondary | ICD-10-CM

## 2022-02-28 DIAGNOSIS — R339 Retention of urine, unspecified: Secondary | ICD-10-CM | POA: Diagnosis not present

## 2022-02-28 DIAGNOSIS — M6281 Muscle weakness (generalized): Secondary | ICD-10-CM

## 2022-02-28 NOTE — Therapy (Addendum)
[place] ?Outpatient Rehabilitation at [facility] ?[address], [address] ?[place], [place], [address] ?Phone: [phone]   Fax:  [phone] ? ?Physical Therapy Treatment ? ?Patient Details  ?Name: Barbara Rangel ?MRN: [id number] ?Date of Birth: 11/22/1945 ?Referring Provider (PT): Dr. Sherlene Shams ? ? ?Encounter Date: 02/28/2022 ? ? PT End of Session - 02/28/22 1215   ? ? Visit Number 12   ? Date for PT Re-Evaluation 04/12/22   ? Authorization Type UHC medicare   ? Authorization - Visit Number 12   ? Authorization - Number of Visits 20   ? PT Start Time 1130   ? PT Stop Time 9476   ? PT Time Calculation (min) 45 min   ? Activity Tolerance Patient tolerated treatment well   ? Behavior During Therapy [facility] for tasks assessed/performed   ? ?  ?  ? ?  ? ? ?Past Medical History:  ?Diagnosis Date  ? Arthritis   ? degenerative arthritis  ? Breast cancer, right breast ([place]) 04/2004  ? T2N1 diagnosed June 2005, ER PR + and Her 2 negative, post mastectomy with axillary node dissection (possibly 9 or 11 nodes removed, with possibly 7 or 9 involved) then treatment on NSABP B28 with dose dense adriamycin cytoxan followed by taxol, all of this Rx  in [place]. Adjuvant arimidex begun Jan 2006. No radiation.   ? Chronic back pain   ? right radicular leg pain  ? Chronic urinary tract infection   ? takes Macrodantin and Cranberry daily  ? Constipation   ? r/t pain meds and takes Dulcolax nightly  ? Glaucoma   ? uses Xalantan nightly  ? Hypertension   ? takes Amlodipine daily  ? Hypokalemia 09/15/2012  ? 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/24/2015  ? Other and unspecified general anesthetics causing adverse effect in therapeutic use   ? hard to wake up  ? PONV (postoperative nausea and vomiting)   ? Syncope 09/15/2012  ? ? ?Past Surgical History:  ?Procedure Laterality Date  ? ANKLE SURGERY  2011  ? left ankle with screws and plates  ? BREAST LUMPECTOMY    ? x2  ? cataract surgery  2005  ? right eye  ?  COLONOSCOPY    ? COLONOSCOPY WITH PROPOFOL N/A 10/11/2014  ? Procedure: COLONOSCOPY WITH PROPOFOL;  Surgeon: Garlan Fair, MD;  [facility]: [facility];  Service: Endoscopy;  Laterality: N/A;  ? EYE SURGERY  2004  ? right eye-detached retina  ? left breast biopsy  2009  ? LUMBAR LAMINECTOMY/DECOMPRESSION MICRODISCECTOMY  10/31/2011  ? Procedure: LUMBAR LAMINECTOMY/DECOMPRESSION MICRODISCECTOMY;  Surgeon: Dahlia Bailiff;  Location: [place];  Service: Orthopedics;  Laterality: Right;  L4-5 Decompression with Right Facet Decompression   ? MASTECTOMY  2005  ? right  d/t breast cancer;no sticks to right arm LYMPH NODES REMOVED.  ? RETINAL DETACHMENT SURGERY  2004  ? right breast biopsy  2005  ? TUBAL LIGATION  1980  ? ? ?There were no vitals filed for this visit. ? ? Subjective Assessment - 02/28/22 1134   ? ? Subjective I have not been catherizing herself. She is charting to compare. So far I am doing well.   ? Patient Stated Goals goal is to empty her bladder fully   ? Currently in Pain? No/denies   ? Multiple Pain Sites No   ? ?  ?  ? ?  ? ? ? ? ? ? ? ? ? ? ? ? ? ? ? ? ?  Pelvic Floor Special Questions - 02/28/22 0001   ? ? Pelvic Floor Internal Exam Patient confirms identification and approves PT to assess pelvic floor and treatment   ? Exam Type Vaginal   ? Strength fair squeeze, definite lift   ? ?  ?  ? ?  ? ? ? ? [place] Adult PT Treatment/Exercise - 02/28/22 0001   ? ?  ? Self-Care  ? Self-Care Other Self-Care Comments   ? Other Self-Care Comments  reviewed on how to use the vaginal wand and sent her you tube videos to refer to.   ?  ? Manual Therapy  ? Manual Therapy Internal Pelvic Floor   ? Internal Pelvic Floor manual work to the levator ani and obturator internist, alon g the perineal body, along the sides of the urethra and bladder with one hand outside mobilizing the bladder.   ? ?  ?  ? ?  ? ? ? ? ? ? ? ? ? ? PT Education - 02/28/22 1212   ? ? Education Details reviewed on how to use the vaginal wand and  sent her you tube videos to reference   ? Person(s) Educated Patient   ? Methods Explanation   ? Comprehension Verbalized understanding   ? ?  ?  ? ?  ? ? ? PT Short Term Goals - 02/14/22 1036   ? ?  ? PT SHORT TERM GOAL #1  ? Title ind with initial HEP   ? Time 4   ? Period Weeks   ? Status Achieved   ? Target Date 11/22/21   ?  ? PT SHORT TERM GOAL #2  ? Title education on vaginal moisturizers to reduce dryness   ? Time 4   ? Period Weeks   ? Status Achieved   ? Target Date 11/22/21   ? ?  ?  ? ?  ? ? ? ? PT Long Term Goals - 02/28/22 1214   ? ?  ? PT LONG TERM GOAL #1  ? Title Pt will be ind with final HEP for elongation of muscles   ? Time 12   ? Period Weeks   ? Status On-going   ? Target Date 01/17/22   ?  ? PT LONG TERM GOAL #2  ? Title abiltiy to expand her lower rib cage for diaphragmatic breathing to elongate the pelvic floor   ? Time 12   ? Period Weeks   ? Status Achieved   ? Target Date 01/17/22   ?  ? PT LONG TERM GOAL #3  ? Title ability to fully empty her bladder so she is able to stop catherizing herself   ? Time 12   ? Period Weeks   ? Status Achieved   ?  ? PT LONG TERM GOAL #4  ? Title pelvic floor strength >/= 3/5 due to the abilty to fully relax her pelvic floor then fully contract   ? Time 12   ? Period Weeks   ? Status Achieved   ? Target Date 01/17/22   ? ?  ?  ? ?  ? ? ? ? ? ? ? ? Plan - 02/28/22 1148   ? ? Clinical Impression Statement Therapist reviewed with pating on using the vaginal wand. Patient continues to not cathrize herself and doing with with urination. Pelvic floor strength is 3/5. She has improved elongation of the pelvic floor muscles. She will still do pelvic motions to fully empty her bladder for the second  void.  Patient will benefit from skilled therapy to improve emptying of her bladder and elongation of the pelvic floor muscles.   ? Personal Factors and Comorbidities Comorbidity 3+   ? Comorbidities Right breast cancer 04/2004; Mastectomy 2005; Lumbar laminectomy  10/2011; Glaucoma; Osteopenia   ? Examination-Activity Limitations Continence   ? Stability/Clinical Decision Making Stable/Uncomplicated   ? Rehab Potential Excellent   ? PT Frequency 1x / week   ? PT Duration 12 weeks   ? PT Treatment/Interventions Biofeedback;Therapeutic activities;Therapeutic exercise;Neuromuscular re-education;Patient/family education;Manual techniques;Dry needling;Taping   ? PT Next Visit Plan dee how it is going with the wand; manual  work internally and discharge from therapy   ? PT Home Exercise Plan Access Code: [email or web address]   ? Consulted and Agree with Plan of Care Patient   ? ?  ?  ? ?  ? ? ?Patient will benefit from skilled therapeutic intervention in order to improve the following deficits and impairments:  Increased fascial restricitons, Decreased strength, Decreased coordination ? ?Visit Diagnosis: ?Cramp and spasm ? ?Muscle weakness (generalized) ? ? ? ? ?Problem List ?Patient Active Problem List  ? Diagnosis Date Noted  ? Carotid artery stenosis 01/05/2020  ? Neck pain 03/30/2018  ? Osteopenia determined by x-ray 08/28/2016  ? History of right breast cancer 08/28/2016  ? Malignant neoplasm of right female breast ([place]) 01/23/2016  ? History of chemotherapy 01/23/2016  ? Estrogen deficiency 07/29/2015  ? 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/24/2015  ? Dense breast tissue 01/24/2015  ? Hx of adenomatous colonic polyps 01/24/2015  ? Plantar fasciitis, bilateral 01/24/2015  ? Breast cancer, right breast ([place]) 07/15/2013  ? Syncope 09/15/2012  ? Hypokalemia 09/15/2012  ? Chronic back pain   ? Chronic urinary tract infection   ? Hypertension   ? Arthritis   ? Synovial cyst of lumbar facet joint 11/01/2011  ? ? ?Earlie Counts, PT ?02/28/22 12:16 PM ? ?[place] ?Outpatient Rehabilitation at [facility] ?[address], [address] ?[place], [place], [address] ?Phone: [phone]   Fax:  [phone] ? ?Name: Barbara Rangel ?MRN: [id number] ?Date of Birth: 11-16-1945 ? ?PHYSICAL THERAPY  DISCHARGE SUMMARY ? ?Visits from Start of Care: 12 ? ?Current functional level related to goals / functional outcomes: ?See above. Patient called to cancel her remaining appointment due to feeling better.  ?  ?Remai

## 2022-03-05 ENCOUNTER — Encounter: Payer: Self-pay | Admitting: Physical Therapy

## 2022-03-07 ENCOUNTER — Other Ambulatory Visit: Payer: Self-pay

## 2022-03-07 ENCOUNTER — Inpatient Hospital Stay: Payer: Medicare Other | Attending: Hematology and Oncology | Admitting: Hematology and Oncology

## 2022-03-07 DIAGNOSIS — Z853 Personal history of malignant neoplasm of breast: Secondary | ICD-10-CM | POA: Insufficient documentation

## 2022-03-07 DIAGNOSIS — C50011 Malignant neoplasm of nipple and areola, right female breast: Secondary | ICD-10-CM

## 2022-03-07 DIAGNOSIS — M8580 Other specified disorders of bone density and structure, unspecified site: Secondary | ICD-10-CM | POA: Insufficient documentation

## 2022-03-07 NOTE — Assessment & Plan Note (Signed)
Stage IIIa right breast cancer T2 N2 M0 diagnosed in June 2005 in [place] treated with mastectomy followed by adjuvant chemotherapy. 7 on 9 lymph nodes were apparently involved., Did not get adjuvant radiation?(Unknown reasons). ?Arimidex 1 mg daily started January 2006?stopped 02/26/2019 ?? ?Osteopenia: Patient is on weightbearing exercise program. ?Bone density 02/17/2020: T score -1.5: Stable ?We will obtain a bone density test next year along with her mammogram. ?? ?Breast cancer surveillance: ?1.??Breast exam  03/07/2022:  Benign. ?2.??Mammogram 01/29/2021: Left breast: No evidence of malignancy breast density category C ?? 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 ?Return to clinic in  1 year for follow-up ?

## 2022-03-12 DIAGNOSIS — H401131 Primary open-angle glaucoma, bilateral, mild stage: Secondary | ICD-10-CM | POA: Diagnosis not present

## 2022-03-12 DIAGNOSIS — H2512 Age-related nuclear cataract, left eye: Secondary | ICD-10-CM | POA: Diagnosis not present

## 2022-03-12 DIAGNOSIS — Z961 Presence of intraocular lens: Secondary | ICD-10-CM | POA: Diagnosis not present

## 2022-03-14 ENCOUNTER — Encounter: Payer: Self-pay | Admitting: Sports Medicine

## 2022-03-14 ENCOUNTER — Ambulatory Visit: Payer: Medicare Other | Admitting: Sports Medicine

## 2022-03-14 DIAGNOSIS — Q828 Other specified congenital malformations of skin: Secondary | ICD-10-CM

## 2022-03-14 DIAGNOSIS — M79672 Pain in left foot: Secondary | ICD-10-CM

## 2022-03-14 DIAGNOSIS — L989 Disorder of the skin and subcutaneous tissue, unspecified: Secondary | ICD-10-CM

## 2022-03-14 DIAGNOSIS — M79671 Pain in right foot: Secondary | ICD-10-CM

## 2022-03-14 DIAGNOSIS — B351 Tinea unguium: Secondary | ICD-10-CM

## 2022-03-14 DIAGNOSIS — I739 Peripheral vascular disease, unspecified: Secondary | ICD-10-CM

## 2022-03-14 DIAGNOSIS — M79609 Pain in unspecified limb: Secondary | ICD-10-CM

## 2022-03-14 NOTE — Progress Notes (Signed)
Subjective: ?Barbara Rangel is a 76 y.o. female patient who returns to office for follow up evaluation of right greater than left foot pain secondary to callus skin and for nail trim. Patient reports that she is doing good and denies any changes with health history since last encounter.  No other issues noted. ? ?Patient Active Problem List  ? Diagnosis Date Noted  ? Carotid artery stenosis 01/05/2020  ? Neck pain 03/30/2018  ? Osteopenia determined by x-ray 08/28/2016  ? History of right breast cancer 08/28/2016  ? Malignant neoplasm of right female breast ([place]) 01/23/2016  ? History of chemotherapy 01/23/2016  ? Estrogen deficiency 07/29/2015  ? 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/24/2015  ? Dense breast tissue 01/24/2015  ? Hx of adenomatous colonic polyps 01/24/2015  ? Plantar fasciitis, bilateral 01/24/2015  ? Breast cancer, right breast ([place]) 07/15/2013  ? Syncope 09/15/2012  ? Hypokalemia 09/15/2012  ? Chronic back pain   ? Chronic urinary tract infection   ? Hypertension   ? Arthritis   ? Synovial cyst of lumbar facet joint 11/01/2011  ? ? ?Current Outpatient Medications on File Prior to Visit  ?Medication Sig Dispense Refill  ? acetaminophen (TYLENOL) 500 MG tablet Take 1,000 mg by mouth at bedtime.    ? amLODipine (NORVASC) 5 MG tablet Take 5 mg by mouth at bedtime.     ? aspirin EC 81 MG tablet Take 81 mg by mouth every morning.     ? atorvastatin (LIPITOR) 20 MG tablet Take 1 tablet by mouth every morning.    ? calcium citrate-vitamin D (CITRACAL+D) 315-200 MG-UNIT per tablet Take 2 tablets by mouth 2 (two) times daily.    ? carbamide peroxide (DEBROX) 6.5 % OTIC solution Place 5 drops into both ears 2 (two) times daily. 15 mL 0  ? cetirizine (ZYRTEC ALLERGY) 10 MG tablet Take 1 tablet (10 mg total) by mouth daily.    ? clotrimazole (MYCELEX) 10 MG troche SMARTSIG:1 Lozenge(s) By Mouth 4 Times Daily    ? Cranberry Extract 250 MG TABS Take 1 capsule by mouth at bedtime.     ? cyclobenzaprine  (FLEXERIL) 5 MG tablet Take 5 mg by mouth as needed.    ? diazepam (VALIUM) 5 MG tablet Place 1 tablet vaginally nightly as needed for muscle spasm/ pelvic pain. 30 tablet 0  ? dorzolamide-timolol (COSOPT) 22.3-6.8 MG/ML ophthalmic solution SMARTSIG:In Eye(s)    ? fluconazole (DIFLUCAN) 150 MG tablet Take 150 mg by mouth once.    ? guaiFENesin (MUCINEX) 600 MG 12 hr tablet Take 1 tablet (600 mg total) by mouth 2 (two) times daily as needed.    ? latanoprost (XALATAN) 0.005 % ophthalmic solution Place 1 drop into both eyes at bedtime. 2.5 mL 1  ? Multiple Vitamins-Minerals (MULTIVITAMINS THER. W/MINERALS) TABS Take 1 tablet by mouth every morning.    ? nitrofurantoin, macrocrystal-monohydrate, (MACROBID) 100 MG capsule Take 1 capsule (100 mg total) by mouth daily. 30 capsule 5  ? omeprazole (PRILOSEC) 20 MG capsule Take 1 capsule (20 mg total) by mouth daily.    ? sertraline (ZOLOFT) 25 MG tablet Take 25 mg by mouth daily.    ? SHINGRIX injection     ? ?No current facility-administered medications on file prior to visit.  ? ? ?Allergies  ?Allergen Reactions  ? Nitrous Oxide Other (See Comments)  ?  Pt passed out and had confusion. Took a few hours to wake her up.  ? Avelox [Moxifloxacin Hcl In Nacl] Other (  See Comments)  ?  Severe headache ?  ? Cephalexin Other (See Comments)  ?  Caused headache  ? Other Other (See Comments)  ?  Anesthesia, needs antiemetic med  ? Quinolones Other (See Comments)  ?  Headaches?  ? Tramadol Nausea Only and Other (See Comments)  ?  Nausea and Dizziness, too.  ? Trimethoprim Other (See Comments)  ?  Rapid heartbeat.  ? Anesthesia S-I-40 [Propofol] Nausea Only  ? Avelox [Moxifloxacin] Other (See Comments)  ? Celexa [Citalopram] Other (See Comments)  ? Tizanidine Hcl Other (See Comments)  ? Citalopram Hydrobromide Other (See Comments)  ?  Pt can't remember what the side effect was.  ? ? ?Objective:  ?General: Alert and oriented x3 in no acute distress ? ?Dermatology: Keratotic lesion  present sub-met 5 on right>left and bilateral hallux with skin lines transversing the lesion, pain is present with direct pressure to the lesion with a central nucleated core noted, no webspace macerations, no ecchymosis bilateral, all nails x 10 are mildly elongated and thickened consistent with onychomycosis. ? ?Vascular: Dorsalis Pedis and Posterior Tibial pedal pulses 1/4, Capillary Fill Time 3 seconds, + pedal hair growth bilateral, no edema bilateral lower extremities, Temperature gradient within normal limits. ? ?Neurology: Gross sensation intact via light touch bilateral. ? ?Musculoskeletal: Mild tenderness with palpation at the keratotic lesion site on Right greater than left, fat pad atrophy bunion and hammertoe deformity pes planus foot type with pain to right>left sub-met 5 at area of nucleation as noted above.  Muscle strength within normal limits. ? ?Assessment and Plan: ?Problem List Items Addressed This Visit   ?None ?Visit Diagnoses   ? ? Porokeratosis    -  Primary  ? Benign skin lesion      ? Pain due to onychomycosis of nail      ? PVD (peripheral vascular disease) ([place])      ? Foot pain, bilateral      ? ?  ? ?-Complete examination performed ?-Re-Discussed treatment options for calluses and nails ?-Mechanically debrided painful nails x10 using a sterile nail nipper without incident ?-At no additional charge, parred  painful keratoic lesions using a chisel blade x2 on left and x2 on right without incident ?-Continue with daily skin emollients like before ?-Dispensed silicone toe spacers to use at 1st webspaces as directed bilateral ?-Continue with good supportive shoes daily for foot type ?-Patient to return to office in 10-12 weeks or as needed or sooner if condition worsens. ? ?Landis Martins, DPM ?

## 2022-03-27 DIAGNOSIS — H6501 Acute serous otitis media, right ear: Secondary | ICD-10-CM | POA: Diagnosis not present

## 2022-03-27 DIAGNOSIS — H9190 Unspecified hearing loss, unspecified ear: Secondary | ICD-10-CM | POA: Diagnosis not present

## 2022-04-01 DIAGNOSIS — L570 Actinic keratosis: Secondary | ICD-10-CM | POA: Diagnosis not present

## 2022-04-05 DIAGNOSIS — H6506 Acute serous otitis media, recurrent, bilateral: Secondary | ICD-10-CM | POA: Diagnosis not present

## 2022-04-15 DIAGNOSIS — R634 Abnormal weight loss: Secondary | ICD-10-CM | POA: Diagnosis not present

## 2022-04-15 DIAGNOSIS — Z681 Body mass index (BMI) 19 or less, adult: Secondary | ICD-10-CM | POA: Diagnosis not present

## 2022-05-01 ENCOUNTER — Ambulatory Visit
Admission: RE | Admit: 2022-05-01 | Discharge: 2022-05-01 | Disposition: A | Discharge status: Home / Self Care | Payer: Medicare Other | Source: Ambulatory Visit | Attending: Hematology and Oncology | Admitting: Hematology and Oncology

## 2022-05-01 DIAGNOSIS — Z780 Asymptomatic menopausal state: Secondary | ICD-10-CM

## 2022-05-01 DIAGNOSIS — M8589 Other specified disorders of bone density and structure, multiple sites: Secondary | ICD-10-CM | POA: Diagnosis not present

## 2022-05-07 ENCOUNTER — Ambulatory Visit: Payer: Medicare Other | Admitting: Obstetrics and Gynecology

## 2022-05-07 ENCOUNTER — Encounter: Payer: Self-pay | Admitting: Obstetrics and Gynecology

## 2022-05-07 VITALS — BP 115/75 | HR 71

## 2022-05-07 DIAGNOSIS — N952 Postmenopausal atrophic vaginitis: Secondary | ICD-10-CM

## 2022-05-07 DIAGNOSIS — R350 Frequency of micturition: Secondary | ICD-10-CM | POA: Diagnosis not present

## 2022-05-17 DIAGNOSIS — H903 Sensorineural hearing loss, bilateral: Secondary | ICD-10-CM | POA: Diagnosis not present

## 2022-06-18 ENCOUNTER — Ambulatory Visit: Payer: Medicare Other | Admitting: Podiatry

## 2022-06-18 ENCOUNTER — Encounter: Payer: Self-pay | Admitting: Podiatry

## 2022-06-18 DIAGNOSIS — Q828 Other specified congenital malformations of skin: Secondary | ICD-10-CM | POA: Diagnosis not present

## 2022-06-18 DIAGNOSIS — M79674 Pain in right toe(s): Secondary | ICD-10-CM

## 2022-06-18 DIAGNOSIS — B351 Tinea unguium: Secondary | ICD-10-CM | POA: Diagnosis not present

## 2022-06-18 DIAGNOSIS — M79675 Pain in left toe(s): Secondary | ICD-10-CM | POA: Diagnosis not present

## 2022-06-18 DIAGNOSIS — I739 Peripheral vascular disease, unspecified: Secondary | ICD-10-CM

## 2022-06-21 DIAGNOSIS — U071 COVID-19: Secondary | ICD-10-CM | POA: Diagnosis not present

## 2022-06-24 NOTE — Progress Notes (Signed)
  Subjective:  Patient ID: Barbara Rangel, female    DOB: [birth year],  MRN: [id number]  Barbara Rangel presents to clinic today for for at risk foot care. Patient has h/o PAD and painful porokeratotic lesion(s) b/l lower extremities and painful mycotic toenails that limit ambulation. Painful toenails interfere with ambulation. Aggravating factors include wearing enclosed shoe gear. Pain is relieved with periodic professional debridement. Painful porokeratotic lesions are aggravated when weightbearing with and without shoegear. Pain is relieved with periodic professional debridement.  New problem(s): None.   PCP is Leeroy Cha, MD , and last visit was  Apr 05, 2022  Allergies  Allergen Reactions   Moxifloxacin Hcl     Other reaction(s): headache   Nitrous Oxide Other (See Comments)    Pt passed out and had confusion. Took a few hours to wake her up.   Avelox [Moxifloxacin Hcl In Nacl] Other (See Comments)    Severe headache    Cephalexin Other (See Comments)    Caused headache   Other Other (See Comments)    Anesthesia, needs antiemetic med   Quinolones Other (See Comments)    Headaches?   Tramadol Nausea Only and Other (See Comments)    Nausea and Dizziness, too.   Trimethoprim Other (See Comments)    Rapid heartbeat.   Anesthesia S-I-40 [Propofol] Nausea Only   Avelox [Moxifloxacin] Other (See Comments)   Celexa [Citalopram] Other (See Comments)   Tizanidine Hcl Other (See Comments)   Citalopram Hydrobromide Other (See Comments)    Pt can't remember what the side effect was.   Review of Systems: Negative except as noted in the HPI.  Objective: No changes noted in today's physical examination.  Vascular Examination: CFT <3 seconds b/l. DP/PT pulses faintly palpable b/l. Skin temperature gradient warm to warm b/l. No ischemia or gangrene. No cyanosis or clubbing noted b/l. Pedal hair sparse. No pain with calf compression b/l.   Neurological  Examination: Sensation grossly intact b/l with 10 gram monofilament. Vibratory sensation intact b/l.   Dermatological Examination: Pedal skin warm and supple b/l. Toenails 1-5 b/l thick, discolored, elongated with subungual debris and pain on dorsal palpation.  Porokeratotic lesion(s) medial IPJ of left great toe, medial IPJ of right great toe, submet head 5 b/l, and 1st metatarsal head right lower extremity. No erythema, no edema, no drainage, no fluctuance.  Musculoskeletal Examination: Muscle strength 5/5 to b/l LE. HAV with bunion bilaterally and hammertoes 2-5 b/l. Pes planus deformity noted bilateral LE. Plantar fat pad atrophy of forefoot area b/l lower extremities.  Radiographs: None  Assessment/Plan: 1. Pain due to onychomycosis of nail   2. Porokeratosis   3. PVD (peripheral vascular disease) ([place])   -Patient was evaluated and treated. All patient's and/or POA's questions/concerns answered on today's visit. -Continue foot and shoe inspections daily. Monitor blood glucose per PCP/Endocrinologist's recommendations. -Toenails 1-5 b/l were debrided in length and girth with sterile nail nippers and dremel without iatrogenic bleeding.  -Porokeratotic lesion(s) medial IPJ of left great toe, medial IPJ of right great toe, submet head 5 b/l, and 1st metatarsal head right lower extremity pared and enucleated with sterile scalpel blade without incident. Total number of lesions debrided=5. -Patient/POA to call should there be question/concern in the interim.   Return in about 3 months (around 09/18/2022).  Marzetta Board, DPM

## 2022-07-03 DIAGNOSIS — B370 Candidal stomatitis: Secondary | ICD-10-CM | POA: Diagnosis not present

## 2022-07-03 DIAGNOSIS — H6523 Chronic serous otitis media, bilateral: Secondary | ICD-10-CM | POA: Diagnosis not present

## 2022-07-05 DIAGNOSIS — L814 Other melanin hyperpigmentation: Secondary | ICD-10-CM | POA: Diagnosis not present

## 2022-07-05 DIAGNOSIS — L578 Other skin changes due to chronic exposure to nonionizing radiation: Secondary | ICD-10-CM | POA: Diagnosis not present

## 2022-07-05 DIAGNOSIS — L821 Other seborrheic keratosis: Secondary | ICD-10-CM | POA: Diagnosis not present

## 2022-07-05 DIAGNOSIS — D225 Melanocytic nevi of trunk: Secondary | ICD-10-CM | POA: Diagnosis not present

## 2022-07-05 DIAGNOSIS — L570 Actinic keratosis: Secondary | ICD-10-CM | POA: Diagnosis not present

## 2022-07-05 DIAGNOSIS — D2272 Melanocytic nevi of left lower limb, including hip: Secondary | ICD-10-CM | POA: Diagnosis not present

## 2022-07-16 DIAGNOSIS — H35371 Puckering of macula, right eye: Secondary | ICD-10-CM | POA: Diagnosis not present

## 2022-07-16 DIAGNOSIS — H401133 Primary open-angle glaucoma, bilateral, severe stage: Secondary | ICD-10-CM | POA: Diagnosis not present

## 2022-07-16 DIAGNOSIS — H25012 Cortical age-related cataract, left eye: Secondary | ICD-10-CM | POA: Diagnosis not present

## 2022-07-16 DIAGNOSIS — H5211 Myopia, right eye: Secondary | ICD-10-CM | POA: Diagnosis not present

## 2022-07-16 DIAGNOSIS — H524 Presbyopia: Secondary | ICD-10-CM | POA: Diagnosis not present

## 2022-07-16 DIAGNOSIS — H2512 Age-related nuclear cataract, left eye: Secondary | ICD-10-CM | POA: Diagnosis not present

## 2022-07-16 DIAGNOSIS — H31001 Unspecified chorioretinal scars, right eye: Secondary | ICD-10-CM | POA: Diagnosis not present

## 2022-07-16 DIAGNOSIS — H52202 Unspecified astigmatism, left eye: Secondary | ICD-10-CM | POA: Diagnosis not present

## 2022-07-20 LAB — GLUCOSE, POCT (MANUAL RESULT ENTRY): POC Glucose: 96 mg/dl (ref 70–99)

## 2022-07-29 NOTE — Progress Notes (Unsigned)
HISTORY AND PHYSICAL     CC:  follow up. Requesting Provider:  Leeroy Cha,*  HPI: This is a 76 y.o. female here for follow up for carotid artery stenosis.  She has been followed since 2018 for moderate Bilateral ICA stenosis. Initially her Left was more significant that the right. She has been asymptomatic.  Pt was last seen March 2023 and at that time she was not having any neurological sx.  She was having some unintentional weight loss.  She did not have post prandial pain or N/V or fear of food.  She was continuing to be worked up by her PCP.  She had a home screening with her insurance company and was told that she may need to have her blood flow in her legs checked.  She denied any claudication, rest pain or non healing wounds.  She did have some mild swelling.  She was wearing compression socks.   Pt returns today for follow up.    Pt denies any amaurosis fugax, speech difficulties, weakness, numbness, paralysis or clumsiness or facial droop.    She states that her weight has been stable.  She states that looking back over her medical records, she has had periods of weight loss that she thinks may correlate with some anxiety or depression.    She states that she will have occasional pain that radiates from her back down her right lateral leg and some left leg pain when she stands but improves with walking.  She does not have any rest pain or non healing wounds.   The pt is on a statin for cholesterol management.  The pt is on a daily aspirin.   Other AC:  none The pt is on CCB for hypertension.   The pt does not have diabetes Tobacco hx:  never  Pt does not have family hx of AAA.  Past Medical History:  Diagnosis Date   Arthritis    degenerative arthritis   Breast cancer, right breast ([place]) 04/2004   T2N1 diagnosed June 2005, ER PR + and Her 2 negative, post mastectomy with axillary node dissection (possibly 9 or 11 nodes removed, with possibly 7 or 9 involved)  then treatment on NSABP B28 with dose dense adriamycin cytoxan followed by taxol, all of this Rx  in [place]. Adjuvant arimidex begun Jan 2006. No radiation.    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   Other and unspecified general anesthetics causing adverse effect in therapeutic use    hard to wake up   PONV (postoperative nausea and vomiting)    Syncope 09/15/2012    Past Surgical History:  Procedure Laterality Date   ANKLE SURGERY  2011   left ankle with screws and plates   BREAST LUMPECTOMY     x2   cataract surgery  2005   right eye   COLONOSCOPY     COLONOSCOPY WITH PROPOFOL N/A 10/11/2014   Procedure: COLONOSCOPY WITH PROPOFOL;  Surgeon: Garlan Fair, MD;  Location: [facility];  Service: Endoscopy;  Laterality: N/A;   EYE SURGERY  2004   right eye-detached retina   left breast biopsy  2009  LUMBAR LAMINECTOMY/DECOMPRESSION MICRODISCECTOMY  10/31/2011   Procedure: LUMBAR LAMINECTOMY/DECOMPRESSION MICRODISCECTOMY;  Surgeon: Dahlia Bailiff;  Location: [facility];  Service: Orthopedics;  Laterality: Right;  L4-5 Decompression with Right Facet Decompression    MASTECTOMY  2005   right  d/t breast cancer;no sticks to right arm LYMPH NODES REMOVED.   RETINAL DETACHMENT SURGERY  2004   right breast biopsy  2005   TUBAL LIGATION  1980    Allergies  Allergen Reactions   Moxifloxacin Hcl     Other reaction(s): headache   Nitrous Oxide Other (See Comments)    Pt passed out and had confusion. Took a few hours to wake her up.   Avelox [Moxifloxacin Hcl In Nacl] Other (See Comments)    Severe headache    Cephalexin Other (See Comments)    Caused headache   Other Other (See Comments)     Anesthesia, needs antiemetic med   Quinolones Other (See Comments)    Headaches?   Tramadol Nausea Only and Other (See Comments)    Nausea and Dizziness, too.   Trimethoprim Other (See Comments)    Rapid heartbeat.   Anesthesia S-I-40 [Propofol] Nausea Only   Avelox [Moxifloxacin] Other (See Comments)   Celexa [Citalopram] Other (See Comments)   Tizanidine Hcl Other (See Comments)   Citalopram Hydrobromide Other (See Comments)    Pt can't remember what the side effect was.    Current Outpatient Medications  Medication Sig Dispense Refill   acetaminophen (TYLENOL) 500 MG tablet Take 1,000 mg by mouth at bedtime.     alendronate (FOSAMAX) 70 MG tablet      amLODipine (NORVASC) 5 MG tablet Take 5 mg by mouth at bedtime.      aspirin EC 81 MG tablet Take 81 mg by mouth every morning.      atorvastatin (LIPITOR) 20 MG tablet Take 1 tablet by mouth every morning.     calcium citrate-vitamin D (CITRACAL+D) 315-200 MG-UNIT per tablet Take 2 tablets by mouth 2 (two) times daily.     carbamide peroxide (DEBROX) 6.5 % OTIC solution Place 5 drops into both ears 2 (two) times daily. 15 mL 0   Cranberry Extract 250 MG TABS Take 1 capsule by mouth at bedtime.      cyclobenzaprine (FLEXERIL) 5 MG tablet Take 5 mg by mouth as needed.     dorzolamide-timolol (COSOPT) 22.3-6.8 MG/ML ophthalmic solution SMARTSIG:In Eye(s)     fluorouracil (EFUDEX) 5 % cream 1 application     guaiFENesin (MUCINEX) 600 MG 12 hr tablet Take 1 tablet (600 mg total) by mouth 2 (two) times daily as needed.     latanoprost (XALATAN) 0.005 % ophthalmic solution Place 1 drop into both eyes at bedtime. 2.5 mL 1   levocetirizine (XYZAL) 5 MG tablet Take 5 mg by mouth every evening.     Multiple Vitamins-Minerals (MULTIVITAMINS THER. W/MINERALS) TABS Take 1 tablet by mouth every morning.     omeprazole (PRILOSEC) 20 MG capsule Take 1 capsule (20 mg total) by mouth daily.     omeprazole (PRILOSEC) 40 MG capsule      ranitidine  (ZANTAC) 150 MG tablet      sertraline (ZOLOFT) 25 MG tablet Take 25 mg by mouth daily.     SHINGRIX injection      tolterodine (DETROL LA) 4 MG 24 hr capsule      No current facility-administered medications for this visit.    Family History  Problem Relation Age of Onset   Colon cancer  Mother    Breast cancer Cousin        on dads side   Breast cancer Cousin        on moms side   Anesthesia problems Neg Hx    Hypotension Neg Hx    Malignant hyperthermia Neg Hx    Pseudochol deficiency Neg Hx     Social History   Socioeconomic History   Marital status: Married    Spouse name: Doren Custard   Number of children: 4   Years of education: BS   Highest education level: Not on file  Occupational History   Occupation: Retired  Tobacco Use   Smoking status: Never   Smokeless tobacco: Never  Vaping Use   Vaping Use: Never used  Substance and Sexual Activity   Alcohol use: No   Drug use: No   Sexual activity: Not Currently  Other Topics Concern   Not on file  Social History Narrative   Pt lives at home with spouse.   Caffeine Use: none   Social Determinants of Radio broadcast assistant Strain: Not on file  Food Insecurity: Not on file  Transportation Needs: Not on file  Physical Activity: Not on file  Stress: Not on file  Social Connections: Not on file  Intimate Partner Violence: Not on file     REVIEW OF SYSTEMS:   '[X]'$  denotes positive finding, '[ ]'$  denotes negative finding Cardiac  Comments:  Chest pain or chest pressure:    Shortness of breath upon exertion:    Short of breath when lying flat:    Irregular heart rhythm:        Vascular    Pain in calf, thigh, or hip brought on by ambulation:    Pain in feet at night that wakes you up from your sleep:     Blood clot in your veins:    Leg swelling:         Pulmonary    Oxygen at home:    Productive cough:     Wheezing:         Neurologic    Sudden weakness in arms or legs:     Sudden numbness in  arms or legs:     Sudden onset of difficulty speaking or slurred speech:    Temporary loss of vision in one eye:     Problems with dizziness:         Gastrointestinal    Blood in stool:     Vomited blood:         Genitourinary    Burning when urinating:     Blood in urine:        Psychiatric    Major depression:         Hematologic    Bleeding problems:    Problems with blood clotting too easily:        Skin    Rashes or ulcers:        Constitutional    Fever or chills:      PHYSICAL EXAMINATION:  Today's Vitals   07/30/22 1438  BP: 131/77  Pulse: (!) 59  Resp: 20  Temp: (!) 97.5 F (36.4 C)  TempSrc: Temporal  SpO2: 100%  Weight: 119 lb (54 kg)  Height: '5\' 4"'$  (1.626 m)   Body mass index is 20.43 kg/m.   General:  WDWN in NAD; vital signs documented above Gait: Not observed HENT: WNL, normocephalic Pulmonary: normal non-labored breathing Cardiac: regular HR, without carotid bruits Abdomen:  soft, NT; aortic pulse is not palpable Skin: without rashes Vascular Exam/Pulses:  Right Left  Radial 2+ (normal) 2+ (normal)  DP 1+ (weak) 1+ (weak)   Extremities: without open wounds; hemosiderin staining BLE Musculoskeletal: no muscle wasting or atrophy  Neurologic: A&O X 3; moving all extremities equally; speech is fluent/normal Psychiatric:  The pt has Normal affect.   Non-Invasive Vascular Imaging:   Carotid Duplex on 07/30/2022 Right:  1-39% ICA stenosis Left:  40-59% ICA stenosis   Previous Carotid duplex on 01/22/2022: Right: 1-39% ICA stenosis Left:   60-79% ICA stenosis    ASSESSMENT/PLAN:: 77 y.o. female here for follow up carotid artery stenosis.  She has been followed since 2018 for moderate Bilateral ICA stenosis.   -duplex today reveals duplex 40-59% left ICA stenosis and is relatively unchanged from previous duplex.  Given she was slightly into the 6j0-79% range on the last duplex, will have her return in 6 months for repeat study.  If it  remains in the 40-59% category and she remains asymptomatic, will have her f/u in one year.  She expressed understanding.  -discussed s/s of stroke with pt and she understands should she develop any of these sx, she will go to the nearest ER or call 911. -pt will call sooner should they have any issues. -continue statin/asa   Leontine Locket, [facility] [phone]  Clinic MD:  Carlis Abbott

## 2022-07-30 ENCOUNTER — Ambulatory Visit: Payer: Medicare Other | Admitting: Physician Assistant

## 2022-07-30 ENCOUNTER — Ambulatory Visit (HOSPITAL_COMMUNITY)
Admission: RE | Admit: 2022-07-30 | Discharge: 2022-07-30 | Disposition: A | Discharge status: Home / Self Care | Payer: Medicare Other | Source: Ambulatory Visit | Attending: Vascular Surgery | Admitting: Vascular Surgery

## 2022-07-30 VITALS — BP 131/77 | HR 59 | Temp 97.5°F | Resp 20 | Ht 64.0 in | Wt 119.0 lb

## 2022-07-30 DIAGNOSIS — I6523 Occlusion and stenosis of bilateral carotid arteries: Secondary | ICD-10-CM | POA: Diagnosis not present

## 2022-08-07 ENCOUNTER — Other Ambulatory Visit: Payer: Self-pay

## 2022-08-07 DIAGNOSIS — I6523 Occlusion and stenosis of bilateral carotid arteries: Secondary | ICD-10-CM

## 2022-08-21 DIAGNOSIS — K219 Gastro-esophageal reflux disease without esophagitis: Secondary | ICD-10-CM | POA: Diagnosis not present

## 2022-08-23 DIAGNOSIS — J309 Allergic rhinitis, unspecified: Secondary | ICD-10-CM | POA: Diagnosis not present

## 2022-08-23 DIAGNOSIS — Z23 Encounter for immunization: Secondary | ICD-10-CM | POA: Diagnosis not present

## 2022-08-23 DIAGNOSIS — I10 Essential (primary) hypertension: Secondary | ICD-10-CM | POA: Diagnosis not present

## 2022-09-02 ENCOUNTER — Encounter: Payer: Self-pay | Admitting: *Deleted

## 2022-09-04 DIAGNOSIS — C50911 Malignant neoplasm of unspecified site of right female breast: Secondary | ICD-10-CM | POA: Diagnosis not present

## 2022-09-25 ENCOUNTER — Ambulatory Visit: Payer: Medicare Other | Admitting: Podiatry

## 2022-09-25 ENCOUNTER — Encounter: Payer: Self-pay | Admitting: Podiatry

## 2022-09-25 DIAGNOSIS — M79609 Pain in unspecified limb: Secondary | ICD-10-CM

## 2022-09-25 DIAGNOSIS — B351 Tinea unguium: Secondary | ICD-10-CM | POA: Diagnosis not present

## 2022-09-25 DIAGNOSIS — I739 Peripheral vascular disease, unspecified: Secondary | ICD-10-CM

## 2022-09-25 DIAGNOSIS — Q828 Other specified congenital malformations of skin: Secondary | ICD-10-CM

## 2022-09-30 NOTE — Progress Notes (Signed)
  Subjective:  Patient ID: Barbara Rangel, female    DOB: 06/15/1946,  MRN: [id number]  Barbara Rangel presents to clinic today for at risk foot care. Patient has h/o PAD and painful porokeratotic lesions right lower extremity. Pain prevent(s) comfortable ambulation. Aggravating factor is weightbearing with and without shoegear.  Chief Complaint  Patient presents with   Nail Problem    Routine foot care PCP-Varadarajan PCP [facility]- 2 month ago   New problem(s): None.   PCP is Leeroy Cha, MD.  Allergies  Allergen Reactions   Moxifloxacin Hcl     Other reaction(s): headache   Nitrous Oxide Other (See Comments)    Pt passed out and had confusion. Took a few hours to wake her up.   Avelox [Moxifloxacin Hcl In Nacl] Other (See Comments)    Severe headache    Cephalexin Other (See Comments)    Caused headache   Other Other (See Comments)    Anesthesia, needs antiemetic med   Quinolones Other (See Comments)    Headaches?   Tramadol Nausea Only and Other (See Comments)    Nausea and Dizziness, too.   Trimethoprim Other (See Comments)    Rapid heartbeat.   Avelox [Moxifloxacin] Other (See Comments)   Celexa [Citalopram] Other (See Comments)   Tizanidine Hcl Other (See Comments)   Citalopram Hydrobromide Other (See Comments)    Pt can't remember what the side effect was.   Review of Systems: Negative except as noted in the HPI.  Objective:   Barbara Rangel is a pleasant 76 y.o. female WD, WN in NAD. AAO x 3. Vascular Examination: CFT <3 seconds b/l. DP/PT pulses faintly palpable b/l. Skin temperature gradient warm to warm b/l. No ischemia or gangrene. No cyanosis or clubbing noted b/l. Pedal hair sparse. No pain with calf compression b/l.   Neurological Examination: Sensation grossly intact b/l with 10 gram monofilament. Vibratory sensation intact b/l.   Dermatological Examination: Pedal skin warm and supple b/l. Toenails 1-5 b/l thick, discolored,  elongated with subungual debris and pain on dorsal palpation.  Porokeratotic lesion(s) medial IPJ of left great toe, medial IPJ of right great toe, submet head 5 right foot, and 1st metatarsal head right lower extremity. No erythema, no edema, no drainage, no fluctuance.  Musculoskeletal Examination: Muscle strength 5/5 to b/l LE. HAV with bunion bilaterally and hammertoes 2-5 b/l. Pes planus deformity noted bilateral LE. Plantar fat pad atrophy of forefoot area b/l lower extremities.  Radiographs: None Assessment/Plan: 1. Pain due to onychomycosis of nail   2. Porokeratosis   3. PVD (peripheral vascular disease) ([place])     No orders of the defined types were placed in this encounter.   -Consent given for treatment as described below: -Mycotic toenails 1-5 bilaterally were debrided in length and girth with sterile nail nippers and dremel without incident. -Porokeratotic lesion(s) bilateral great toes, submet head 5 right foot, and 1st metatarsal head right lower extremity pared and enucleated with sterile currette without incident. Total number of lesions debrided=4. -Patient/POA to call should there be question/concern in the interim.   Return in about 3 months (around 12/26/2022).  Marzetta Board, DPM

## 2022-10-01 DIAGNOSIS — C50911 Malignant neoplasm of unspecified site of right female breast: Secondary | ICD-10-CM | POA: Diagnosis not present

## 2022-10-07 DIAGNOSIS — L570 Actinic keratosis: Secondary | ICD-10-CM | POA: Diagnosis not present

## 2022-11-06 ENCOUNTER — Ambulatory Visit: Payer: Medicare Other | Admitting: Obstetrics and Gynecology

## 2022-11-12 ENCOUNTER — Encounter: Payer: Self-pay | Admitting: Obstetrics and Gynecology

## 2022-11-12 ENCOUNTER — Ambulatory Visit (INDEPENDENT_AMBULATORY_CARE_PROVIDER_SITE_OTHER): Payer: Medicare HMO | Admitting: Obstetrics and Gynecology

## 2022-11-12 VITALS — BP 134/73 | HR 76

## 2022-11-12 DIAGNOSIS — N952 Postmenopausal atrophic vaginitis: Secondary | ICD-10-CM

## 2022-11-12 DIAGNOSIS — R339 Retention of urine, unspecified: Secondary | ICD-10-CM | POA: Diagnosis not present

## 2022-11-12 NOTE — Progress Notes (Signed)
[facility] Return Visit  SUBJECTIVE  History of Present Illness: Barbara Rangel is a 77 y.o. female seen in follow-up after incomplete bladder emptying/ levator spasm and recurrent UTI.    She feels she is doing well. Feels that she is emptying her bladder well. Still has to occasionally double void. Working on pelvic floor exercises at home. She feels that her anxiety levels have improved (started a new medication) and that has helped her symptoms. Occasionally, if she ignores having to void, then she will have a small amount of leakage.   She has not had any UTIs since her last visit. Continues to take Ellura for prophylaxis.  She is using a vitamin E cream every other day.    Past Medical History: Patient  has a past medical history of Arthritis, Breast cancer, right breast ([place]) (04/2004), Chronic back pain, Chronic urinary tract infection, Constipation, Glaucoma, Hypertension, Hypokalemia (09/15/2012), Osteopenia due to cancer therapy (01/24/2015), Other and unspecified general anesthetics causing adverse effect in therapeutic use, PONV (postoperative nausea and vomiting), and Syncope (09/15/2012).   Past Surgical History: She  has a past surgical history that includes Tubal ligation (1980); Eye surgery (2004); cataract surgery (2005); right breast biopsy (2005); left breast biopsy (2009); Ankle surgery (2011); Colonoscopy; Retinal detachment surgery (2004); Mastectomy (2005); Lumbar laminectomy/decompression microdiscectomy (10/31/2011); Colonoscopy with propofol (N/A, 10/11/2014); and Breast lumpectomy.   Medications: She has a current medication list which includes the following prescription(s): acetaminophen, amlodipine, aspirin ec, atorvastatin, calcium citrate-vitamin d, debrox, cetirizine, cranberry extract, cyclobenzaprine, dorzolamide-timolol, fluorouracil, guaifenesin, latanoprost, levocetirizine, multivitamins ther. w/minerals, omeprazole, sertraline, and shingrix.    Allergies: Patient is allergic to moxifloxacin hcl, nitrous oxide, avelox [moxifloxacin hcl in nacl], cephalexin, other, quinolones, tramadol, trimethoprim, avelox [moxifloxacin], celexa [citalopram], tizanidine hcl, and citalopram hydrobromide.   Social History: Patient  reports that she has never smoked. She has never been exposed to tobacco smoke. She has never used smokeless tobacco. She reports that she does not drink alcohol and does not use drugs.      OBJECTIVE     Physical Exam: Vitals:   11/12/22 1555  BP: 134/73  Pulse: 76    Gen: No apparent distress, A&O x 3.  Detailed Urogynecologic Evaluation:  Deferred.    POP-Q:    POP-Q   -1.5                                            Aa   -1.5                                           Ba   -6                                              C    2                                            Gh   3  Pb   8                                            tvl    -2                                            Ap   -2                                            Bp   -8                                              D         ASSESSMENT AND PLAN    Barbara Rangel is a 77 y.o. with:  1. Incomplete bladder emptying   2. Vaginal atrophy     - Bladder emptying has improved. Continue with double voiding - can use vitamin E daily if needed. - Continue with ellura for prophylaxis of UTI.   Return as needed  [name], MD  Time spent: I spent 15 minutes dedicated to the care of this patient on the date of this encounter to include pre-visit review of records, face-to-face time with the patient and post visit documentation.

## 2022-11-14 DIAGNOSIS — E785 Hyperlipidemia, unspecified: Secondary | ICD-10-CM | POA: Diagnosis not present

## 2022-11-14 DIAGNOSIS — L309 Dermatitis, unspecified: Secondary | ICD-10-CM | POA: Diagnosis not present

## 2022-11-14 DIAGNOSIS — J309 Allergic rhinitis, unspecified: Secondary | ICD-10-CM | POA: Diagnosis not present

## 2022-11-14 DIAGNOSIS — I10 Essential (primary) hypertension: Secondary | ICD-10-CM | POA: Diagnosis not present

## 2022-11-19 DIAGNOSIS — H2512 Age-related nuclear cataract, left eye: Secondary | ICD-10-CM | POA: Diagnosis not present

## 2022-11-19 DIAGNOSIS — H401133 Primary open-angle glaucoma, bilateral, severe stage: Secondary | ICD-10-CM | POA: Diagnosis not present

## 2022-11-26 DIAGNOSIS — H401133 Primary open-angle glaucoma, bilateral, severe stage: Secondary | ICD-10-CM | POA: Diagnosis not present

## 2022-12-04 DIAGNOSIS — F4323 Adjustment disorder with mixed anxiety and depressed mood: Secondary | ICD-10-CM | POA: Diagnosis not present

## 2022-12-04 DIAGNOSIS — R69 Illness, unspecified: Secondary | ICD-10-CM | POA: Diagnosis not present

## 2022-12-27 ENCOUNTER — Encounter: Payer: Self-pay | Admitting: Podiatry

## 2022-12-27 ENCOUNTER — Ambulatory Visit: Payer: Medicare HMO | Admitting: Podiatry

## 2022-12-27 VITALS — BP 130/61

## 2022-12-27 DIAGNOSIS — Q828 Other specified congenital malformations of skin: Secondary | ICD-10-CM

## 2022-12-27 DIAGNOSIS — I739 Peripheral vascular disease, unspecified: Secondary | ICD-10-CM | POA: Diagnosis not present

## 2022-12-27 DIAGNOSIS — M79609 Pain in unspecified limb: Secondary | ICD-10-CM | POA: Diagnosis not present

## 2022-12-27 DIAGNOSIS — B351 Tinea unguium: Secondary | ICD-10-CM | POA: Diagnosis not present

## 2022-12-27 NOTE — Progress Notes (Unsigned)
  Subjective:  Patient ID: Barbara Rangel, female    DOB: 1945/11/22,  MRN: [id number]  Barbara Rangel presents to clinic today for {jgcomplaint:23593}  Chief Complaint  Patient presents with   Nail Problem    [place] PCP-Varadarajan PCP VST-11/2022   New problem(s): None. {jgcomplaint:23593}  PCP is Leeroy Cha, MD.  Allergies  Allergen Reactions   Moxifloxacin Hcl     Other reaction(s): headache   Nitrous Oxide Other (See Comments)    Pt passed out and had confusion. Took a few hours to wake her up.   Avelox [Moxifloxacin Hcl In Nacl] Other (See Comments)    Severe headache    Cephalexin Other (See Comments)    Caused headache   Other Other (See Comments)    Anesthesia, needs antiemetic med   Quinolones Other (See Comments)    Headaches?   Tramadol Nausea Only and Other (See Comments)    Nausea and Dizziness, too.   Trimethoprim Other (See Comments)    Rapid heartbeat.   Avelox [Moxifloxacin] Other (See Comments)   Celexa [Citalopram] Other (See Comments)   Tizanidine Hcl Other (See Comments)   Citalopram Hydrobromide Other (See Comments)    Pt can't remember what the side effect was.    Review of Systems: Negative except as noted in the HPI.  Objective: No changes noted in today's physical examination. Vitals:   12/27/22 1128  BP: 130/61   Barbara Rangel is a pleasant 77 y.o. female {jgbodyhabitus:24098} AAO x 3. Vascular Examination: CFT <3 seconds b/l. DP/PT pulses faintly palpable b/l. Skin temperature gradient warm to warm b/l. No ischemia or gangrene. No cyanosis or clubbing noted b/l. Pedal hair sparse. No pain with calf compression b/l.   Neurological Examination: Sensation grossly intact b/l with 10 gram monofilament. Vibratory sensation intact b/l.   Dermatological Examination: Pedal skin warm and supple b/l. Toenails 1-5 b/l thick, discolored, elongated with subungual debris and pain on dorsal palpation.    Porokeratotic  lesion(s) medial IPJ of left great toe, medial IPJ of right great toe, submet head 5 right foot, and 1st metatarsal head right lower extremity. No erythema, no edema, no drainage, no fluctuance.  Musculoskeletal Examination: Muscle strength 5/5 to b/l LE. HAV with bunion bilaterally and hammertoes 2-5 b/l. Pes planus deformity noted bilateral LE. Plantar fat pad atrophy of forefoot area b/l lower extremities.  Radiographs: None  Assessment/Plan: No diagnosis found.  No orders of the defined types were placed in this encounter.   None {Jgplan:23602::"-Patient/POA to call should there be question/concern in the interim."}   Return in about 3 months (around 03/27/2023).  Marzetta Board, DPM

## 2022-12-30 ENCOUNTER — Encounter: Payer: Self-pay | Admitting: *Deleted

## 2022-12-31 ENCOUNTER — Other Ambulatory Visit: Payer: Self-pay | Admitting: Hematology and Oncology

## 2022-12-31 DIAGNOSIS — Z1231 Encounter for screening mammogram for malignant neoplasm of breast: Secondary | ICD-10-CM

## 2023-01-08 DIAGNOSIS — F4323 Adjustment disorder with mixed anxiety and depressed mood: Secondary | ICD-10-CM | POA: Diagnosis not present

## 2023-01-08 DIAGNOSIS — R69 Illness, unspecified: Secondary | ICD-10-CM | POA: Diagnosis not present

## 2023-01-28 ENCOUNTER — Ambulatory Visit (HOSPITAL_COMMUNITY)
Admission: RE | Admit: 2023-01-28 | Discharge: 2023-01-28 | Disposition: A | Discharge status: Home / Self Care | Payer: Medicare HMO | Source: Ambulatory Visit | Attending: Vascular Surgery | Admitting: Vascular Surgery

## 2023-01-28 ENCOUNTER — Encounter: Payer: Self-pay | Admitting: Physician Assistant

## 2023-01-28 ENCOUNTER — Ambulatory Visit: Payer: Medicare HMO | Admitting: Physician Assistant

## 2023-01-28 VITALS — BP 130/68 | HR 67 | Temp 97.6°F | Wt 123.0 lb

## 2023-01-28 DIAGNOSIS — I6523 Occlusion and stenosis of bilateral carotid arteries: Secondary | ICD-10-CM | POA: Diagnosis not present

## 2023-01-28 NOTE — Progress Notes (Signed)
Office Note     CC:  follow up Requesting Provider:  Leeroy Cha,*  HPI: Barbara Rangel is a 77 y.o. (03-10-1946) female who presents for routine follow up for carotid artery stenosis.  She has been followed since 2018 for moderate Bilateral ICA stenosis. Initially her Left was more significant that the right. She has been asymptomatic.   Today she denies any acute visual changes, slurred speech, facial drooping, unilateral upper or lower extremity weakness or numbness. She has open angle glaucoma as well as cataracts so she sees her ophthalmologist regularly. She does report daily occipital headaches that are relieved after 1 hour of waking. No visual changes or aura. She denies any pain on ambulation in her legs or at rest. No tissue loss. She continues to have mild swelling and wears knee high compression stockings PRN. She says she tries to remain active although she feels like since the pandemic that she has overall decreased her activity but she hopes to get back to exercising more.   The pt is on a statin for cholesterol management.  The pt is on a daily aspirin.   Other AC:  none The pt is on CCB for hypertension.   The pt is not diabetic Tobacco hx:  never  Past Medical History:  Diagnosis Date   Arthritis    degenerative arthritis   Breast cancer, right breast ([place]) 04/2004   T2N1 diagnosed June 2005, ER PR + and Her 2 negative, post mastectomy with axillary node dissection (possibly 9 or 11 nodes removed, with possibly 7 or 9 involved) then treatment on NSABP B28 with dose dense adriamycin cytoxan followed by taxol, all of this Rx  in [place]. Adjuvant arimidex begun Jan 2006. No radiation.    Chronic back pain    right radicular leg pain   Chronic urinary tract infection    takes Macrodantin and Cranberry daily   Constipation    r/t pain meds and takes Dulcolax nightly   Glaucoma    uses Xalantan nightly   Hypertension    takes Amlodipine daily    Hypokalemia 09/15/2012   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/24/2015   Other and unspecified general anesthetics causing adverse effect in therapeutic use    hard to wake up   PONV (postoperative nausea and vomiting)    Syncope 09/15/2012    Past Surgical History:  Procedure Laterality Date   ANKLE SURGERY  2011   left ankle with screws and plates   BREAST LUMPECTOMY     x2   cataract surgery  2005   right eye   COLONOSCOPY     COLONOSCOPY WITH PROPOFOL N/A 10/11/2014   Procedure: COLONOSCOPY WITH PROPOFOL;  Surgeon: Garlan Fair, MD;  Location: [facility];  Service: Endoscopy;  Laterality: N/A;   EYE SURGERY  2004   right eye-detached retina   left breast biopsy  2009   LUMBAR LAMINECTOMY/DECOMPRESSION MICRODISCECTOMY  10/31/2011   Procedure: LUMBAR LAMINECTOMY/DECOMPRESSION MICRODISCECTOMY;  Surgeon: Dahlia Bailiff;  Location: [facility];  Service: Orthopedics;  Laterality: Right;  L4-5 Decompression with Right Facet Decompression    MASTECTOMY  2005   right  d/t breast cancer;no sticks to right arm LYMPH NODES REMOVED.   RETINAL DETACHMENT SURGERY  2004   right breast biopsy  2005   TUBAL LIGATION  1980    Social History   Socioeconomic History   Marital status: Married    Spouse name: Doren Custard   Number of children: 4   Years  of education: BS   Highest education level: Not on file  Occupational History   Occupation: Retired  Tobacco Use   Smoking status: Never    Passive exposure: Never   Smokeless tobacco: Never  Vaping Use   Vaping Use: Never used  Substance and Sexual Activity   Alcohol use: No   Drug use: No   Sexual activity: Not Currently  Other Topics Concern   Not on file  Social History Narrative   Pt lives at home with spouse.   Caffeine Use: none   Social Determinants of Radio broadcast assistant Strain: Not on file  Food Insecurity: Not on file  Transportation Needs: Not on file  Physical Activity: Not on file  Stress: Not on file  Social  Connections: Not on file  Intimate Partner Violence: Not on file    Family History  Problem Relation Age of Onset   Colon cancer Mother    Breast cancer Cousin        on dads side   Breast cancer Cousin        on moms side   Anesthesia problems Neg Hx    Hypotension Neg Hx    Malignant hyperthermia Neg Hx    Pseudochol deficiency Neg Hx     Current Outpatient Medications  Medication Sig Dispense Refill   acetaminophen (TYLENOL) 500 MG tablet Take 1,000 mg by mouth at bedtime.     amLODipine (NORVASC) 5 MG tablet Take 5 mg by mouth at bedtime.      aspirin EC 81 MG tablet Take 81 mg by mouth every morning.      atorvastatin (LIPITOR) 20 MG tablet Take 1 tablet by mouth every morning.     calcium citrate-vitamin D (CITRACAL+D) 315-200 MG-UNIT per tablet Take 2 tablets by mouth 2 (two) times daily.     carbamide peroxide (DEBROX) 6.5 % OTIC solution Place 5 drops into both ears 2 (two) times daily. 15 mL 0   cetirizine (ZYRTEC ALLERGY) 10 MG tablet Take 1 tablet every day by oral route.     Cranberry Extract 250 MG TABS Take 1 capsule by mouth at bedtime.      cyclobenzaprine (FLEXERIL) 5 MG tablet Take 5 mg by mouth as needed.     dorzolamide-timolol (COSOPT) 22.3-6.8 MG/ML ophthalmic solution SMARTSIG:In Eye(s)     fluorouracil (EFUDEX) 5 % cream 1 application     guaiFENesin (MUCINEX) 600 MG 12 hr tablet Take 1 tablet (600 mg total) by mouth 2 (two) times daily as needed.     levocetirizine (XYZAL) 5 MG tablet Take 5 mg by mouth every evening.     Multiple Vitamins-Minerals (MULTIVITAMINS THER. W/MINERALS) TABS Take 1 tablet by mouth every morning.     omeprazole (PRILOSEC) 20 MG capsule Take 1 capsule (20 mg total) by mouth daily.     ROCKLATAN 0.02-0.005 % SOLN Apply 1 drop to eye at bedtime.     sertraline (ZOLOFT) 25 MG tablet Take 25 mg by mouth daily.     SHINGRIX injection      No current facility-administered medications for this visit.    Allergies  Allergen  Reactions   Moxifloxacin Hcl     Other reaction(s): headache   Nitrous Oxide Other (See Comments)    Pt passed out and had confusion. Took a few hours to wake her up.   Avelox [Moxifloxacin Hcl In Nacl] Other (See Comments)    Severe headache    Cephalexin Other (See Comments)  Caused headache   Other Other (See Comments)    Anesthesia, needs antiemetic med   Quinolones Other (See Comments)    Headaches?   Tramadol Nausea Only and Other (See Comments)    Nausea and Dizziness, too.   Trimethoprim Other (See Comments)    Rapid heartbeat.   Avelox [Moxifloxacin] Other (See Comments)   Celexa [Citalopram] Other (See Comments)   Tizanidine Hcl Other (See Comments)   Citalopram Hydrobromide Other (See Comments)    Pt can't remember what the side effect was.     REVIEW OF SYSTEMS:  [X]  denotes positive finding, [ ]  denotes negative finding Cardiac  Comments:  Chest pain or chest pressure:    Shortness of breath upon exertion:    Short of breath when lying flat:    Irregular heart rhythm:        Vascular    Pain in calf, thigh, or hip brought on by ambulation:    Pain in feet at night that wakes you up from your sleep:     Blood clot in your veins:    Leg swelling:         Pulmonary    Oxygen at home:    Productive cough:     Wheezing:         Neurologic    Sudden weakness in arms or legs:     Sudden numbness in arms or legs:     Sudden onset of difficulty speaking or slurred speech:    Temporary loss of vision in one eye:     Problems with dizziness:         Gastrointestinal    Blood in stool:     Vomited blood:         Genitourinary    Burning when urinating:     Blood in urine:        Psychiatric    Major depression:         Hematologic    Bleeding problems:    Problems with blood clotting too easily:        Skin    Rashes or ulcers:        Constitutional    Fever or chills:      PHYSICAL EXAMINATION:  Vitals:   01/28/23 1108  BP: 130/68   Pulse: 67  Temp: 97.6 F (36.4 C)  TempSrc: Temporal  SpO2: 99%  Weight: 123 lb (55.8 kg)    General:  WDWN in NAD; vital signs documented above Gait: Normal HENT: WNL, normocephalic Pulmonary: normal non-labored breathing , without wheezing Cardiac: regular HR, without  Murmurs without carotid bruit Abdomen: soft, NT, no masses Vascular Exam/Pulses:  Right Left  Radial 2+ (normal) 2+ (normal)  DP 2+ (normal) 2+ (normal)  PT 2+ (normal) 2+ (normal)   Extremities: without ischemic changes, without Gangrene , without cellulitis; without open wounds;  Musculoskeletal: no muscle wasting or atrophy  Neurologic: A&O X 3;  No focal weakness or paresthesias are detected Psychiatric:  The pt has Normal affect.   Non-Invasive Vascular Imaging:   VAS US Carotid Duplex: Summary:  Right Carotid: Right ICA velocities suggest upper 40-59% to lower 60-79% stenosis. However elevated velocities without significant plaque is consistent with fibromuscular dysplasia.   Left Carotid: Velocities in the left ICA are consistent with a 60-79% stenosis. However elevated velocities without significant plaque is consistent with fibromuscular dysplasia.   Vertebrals:  Bilateral vertebral arteries demonstrate antegrade flow.  Subclavians: Normal flow hemodynamics were seen in bilateral  subclavian arteries.    ASSESSMENT/PLAN:: 77 y.o. female here for follow up for  carotid artery stenosis.  She has been followed since 2018 for moderate Bilateral ICA stenosis. Initially her Left was more significant that the right. She remains without any associated symptoms. Her duplex today is essentially unchanged. She has 60-79% ICA stenosis bilaterally. Normal flow in her vertebral and subclavian arteries bilaterally - continue Aspirin and statin - reviewed TIA and stroke like symptoms with her and she understands should these occur to seek immediate medical attention - She will follow up in 6 months with repeat  carotid duplex   Karoline Caldwell, PA-C [facility] [phone]  Clinic MD:  Roxanne Mins

## 2023-01-31 ENCOUNTER — Other Ambulatory Visit: Payer: Self-pay

## 2023-01-31 DIAGNOSIS — I6523 Occlusion and stenosis of bilateral carotid arteries: Secondary | ICD-10-CM

## 2023-02-14 DIAGNOSIS — R69 Illness, unspecified: Secondary | ICD-10-CM | POA: Diagnosis not present

## 2023-02-14 DIAGNOSIS — F4323 Adjustment disorder with mixed anxiety and depressed mood: Secondary | ICD-10-CM | POA: Diagnosis not present

## 2023-02-18 ENCOUNTER — Ambulatory Visit
Admission: RE | Admit: 2023-02-18 | Discharge: 2023-02-18 | Disposition: A | Discharge status: Home / Self Care | Payer: Medicare HMO | Source: Ambulatory Visit | Attending: Hematology and Oncology | Admitting: Hematology and Oncology

## 2023-02-18 DIAGNOSIS — Z1231 Encounter for screening mammogram for malignant neoplasm of breast: Secondary | ICD-10-CM | POA: Diagnosis not present

## 2023-02-26 DIAGNOSIS — Z961 Presence of intraocular lens: Secondary | ICD-10-CM | POA: Diagnosis not present

## 2023-02-26 DIAGNOSIS — H2512 Age-related nuclear cataract, left eye: Secondary | ICD-10-CM | POA: Diagnosis not present

## 2023-02-26 DIAGNOSIS — H401132 Primary open-angle glaucoma, bilateral, moderate stage: Secondary | ICD-10-CM | POA: Diagnosis not present

## 2023-03-09 NOTE — Assessment & Plan Note (Signed)
Stage IIIa right breast cancer T2 N2 M0 diagnosed in June 2005 in [place] treated with mastectomy followed by adjuvant chemotherapy. 7 on 9 lymph nodes were apparently involved., Did not get adjuvant radiation (Unknown reasons). Arimidex 1 mg daily started January 2006 stopped 02/26/2019   Osteopenia: Patient is on weightbearing exercise program.  Bone density 02/17/2020: T score -1.5: Stable We will obtain a bone density test next year along with her mammogram.   Breast cancer surveillance: 1.  Breast exam  03/10/2023:  Benign. 2.  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 Return to clinic in  1 year for follow-up

## 2023-03-10 ENCOUNTER — Inpatient Hospital Stay: Payer: Medicare HMO | Attending: Hematology and Oncology | Admitting: Hematology and Oncology

## 2023-03-10 ENCOUNTER — Other Ambulatory Visit: Payer: Self-pay

## 2023-03-10 VITALS — BP 117/69 | HR 73 | Temp 97.8°F | Resp 18 | Ht 64.0 in | Wt 124.1 lb

## 2023-03-10 DIAGNOSIS — M8580 Other specified disorders of bone density and structure, unspecified site: Secondary | ICD-10-CM | POA: Insufficient documentation

## 2023-03-10 DIAGNOSIS — Z79811 Long term (current) use of aromatase inhibitors: Secondary | ICD-10-CM | POA: Insufficient documentation

## 2023-03-10 DIAGNOSIS — C50011 Malignant neoplasm of nipple and areola, right female breast: Secondary | ICD-10-CM

## 2023-03-10 DIAGNOSIS — Z853 Personal history of malignant neoplasm of breast: Secondary | ICD-10-CM | POA: Insufficient documentation

## 2023-03-10 DIAGNOSIS — Z9011 Acquired absence of right breast and nipple: Secondary | ICD-10-CM | POA: Insufficient documentation

## 2023-03-10 NOTE — Progress Notes (Signed)
Patient Care Team: Lorenda Ishihara, MD as PCP - General (Internal Medicine) Serena Croissant, MD as Consulting Physician (Hematology and Oncology)  DIAGNOSIS:  Encounter Diagnosis  Name Primary?   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, unspecified estrogen receptor status (HCC) Yes    SUMMARY OF ONCOLOGIC HISTORY: Oncology History  Breast cancer, right breast (HCC)  01/10/2004 Initial Diagnosis   Patient found right breast cancer on breast self exam after negative mammogram same year, diagnosis March 2005 in [place]   04/26/2004 Surgery   Right mastectomy: T2 N2a M0 ER/PR positive HER-2 negative stage IIIa; 9 lymph nodes were involved (did not get radiation)   05/18/2004 - 09/21/2004 Chemotherapy   NSABP B 28 clinical trial with dose dense Adriamycin and Cytoxan followed by Taxol    11/12/2004 - 02/26/2019 Anti-estrogen oral therapy   Arimidex 1 mg daily     CHIEF COMPLIANT: Breast cancer surveillance  INTERVAL HISTORY: Barbara Rangel is a 77 y.o. with above-mentioned history of right breast cancer treated with mastectomy, adjuvant chemotherapy, and completed anastrozole.  She presents to the clinic today for a follow-up. She reports that everything has been going good. She is exercising and trying to stay active. She denies any pain or discomfort in breast. She says she has some stiffness in the chest area when she wakes up, but she does yoga and it helps with relief.   ALLERGIES:  is allergic to moxifloxacin hcl, nitrous oxide, avelox [moxifloxacin hcl in nacl], cephalexin, other, quinolones, tramadol, trimethoprim, avelox [moxifloxacin], celexa [citalopram], prednisone, tizanidine hcl, and citalopram hydrobromide.  MEDICATIONS:  Current Outpatient Medications  Medication Sig Dispense Refill   acetaminophen (TYLENOL) 500 MG tablet Take 1,000 mg by mouth at bedtime.     amLODipine (NORVASC) 5 MG tablet Take 5 mg by mouth at  bedtime.      AREXVY 120 MCG/0.5ML injection      aspirin EC 81 MG tablet Take 81 mg by mouth every morning.      atorvastatin (LIPITOR) 20 MG tablet Take 1 tablet by mouth every morning.     calcium citrate-vitamin D (CITRACAL+D) 315-200 MG-UNIT per tablet Take 2 tablets by mouth 2 (two) times daily.     carbamide peroxide (DEBROX) 6.5 % OTIC solution Place 5 drops into both ears 2 (two) times daily. 15 mL 0   cetirizine (ZYRTEC ALLERGY) 10 MG tablet Take 1 tablet every day by oral route.     Cranberry Extract 250 MG TABS Take 1 capsule by mouth at bedtime.      cyclobenzaprine (FLEXERIL) 5 MG tablet Take 5 mg by mouth as needed.     dorzolamide-timolol (COSOPT) 22.3-6.8 MG/ML ophthalmic solution SMARTSIG:In Eye(s)     fluorouracil (EFUDEX) 5 % cream 1 application     guaiFENesin (MUCINEX) 600 MG 12 hr tablet Take 1 tablet (600 mg total) by mouth 2 (two) times daily as needed.     levocetirizine (XYZAL) 5 MG tablet Take 5 mg by mouth every evening.     Multiple Vitamins-Minerals (MULTIVITAMINS THER. W/MINERALS) TABS Take 1 tablet by mouth every morning.     omeprazole (PRILOSEC) 20 MG capsule Take 1 capsule (20 mg total) by mouth daily.     ROCKLATAN 0.02-0.005 % SOLN Apply 1 drop to eye at bedtime.     sertraline (ZOLOFT) 25 MG tablet Take 25 mg by mouth daily.     SHINGRIX injection      No current facility-administered medications for this  visit.    PHYSICAL EXAMINATION: ECOG PERFORMANCE STATUS: 1 - Symptomatic but completely ambulatory  Vitals:   03/10/23 1133  BP: 117/69  Pulse: 73  Resp: 18  Temp: 97.8 F (36.6 C)  SpO2: 98%   Filed Weights   03/10/23 1133  Weight: 124 lb 1.6 oz (56.3 kg)    BREAST: No palpable masses or nodules in right chest wall or left breasts. No palpable axillary supraclavicular or infraclavicular adenopathy no breast tenderness or nipple discharge. (exam performed in the presence of a chaperone)  LABORATORY DATA:  I have reviewed the data as  listed    Latest Ref Rng & Units 08/31/2019   10:18 PM 08/16/2019    1:51 PM 02/24/2017    9:49 AM  CMP  Glucose 70 - 99 mg/dL 161  86  82   BUN 8 - 23 mg/dL 11  12  09.6   Creatinine 0.44 - 1.00 mg/dL 0.45  4.09  0.8   Sodium 135 - 145 mmol/L 138  142  143   Potassium 3.5 - 5.1 mmol/L 3.7  4.2  4.0   Chloride 98 - 111 mmol/L 102  103    CO2 22 - 32 mmol/L 24  26  28    Calcium 8.9 - 10.3 mg/dL 9.9  81.1  91.4   Total Protein 6.0 - 8.5 g/dL  7.2  7.0   Total Bilirubin 0.0 - 1.2 mg/dL  0.5  7.82   Alkaline Phos 39 - 117 IU/L  79  60   AST 0 - 40 IU/L  34  27   ALT 0 - 32 IU/L  28  26     Lab Results  Component Value Date   WBC 5.8 08/31/2019   HGB 15.4 (H) 08/31/2019   HCT 46.3 (H) 08/31/2019   MCV 98.3 08/31/2019   PLT 220 08/31/2019   NEUTROABS 3.0 02/24/2017    ASSESSMENT & PLAN:  Breast cancer, right breast (HCC) Stage IIIa right breast cancer T2 N2 M0 diagnosed in June 2005 in [place] treated with mastectomy followed by adjuvant chemotherapy. 7 on 9 lymph nodes were apparently involved., Did not get adjuvant radiation (Unknown reasons). Arimidex 1 mg daily started January 2006 stopped 02/26/2019   Osteopenia: Patient is on weightbearing exercise program.  Bone density 02/17/2020: T score -1.5: Stable We will obtain a bone density test next year along with her mammogram.   Breast cancer surveillance: 1.  Breast exam  03/10/2023:  Benign. 2.  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 Return to clinic in  1 year for follow-up      No orders of the defined types were placed in this encounter.  The patient has a good understanding of the overall plan. she agrees with it. she will call with any problems that may develop before the next visit here. Total time spent: 30 mins including face to face time and time spent for  planning, charting and co-ordination of care   Tamsen Meek, MD 03/10/23    I Janan Ridge am acting as a Neurosurgeon for [employer]  I have reviewed the above documentation for accuracy and completeness, and I agree with the above.

## 2023-03-12 DIAGNOSIS — R600 Localized edema: Secondary | ICD-10-CM | POA: Diagnosis not present

## 2023-03-12 DIAGNOSIS — I8310 Varicose veins of unspecified lower extremity with inflammation: Secondary | ICD-10-CM | POA: Diagnosis not present

## 2023-03-12 DIAGNOSIS — E785 Hyperlipidemia, unspecified: Secondary | ICD-10-CM | POA: Diagnosis not present

## 2023-03-12 DIAGNOSIS — F419 Anxiety disorder, unspecified: Secondary | ICD-10-CM | POA: Diagnosis not present

## 2023-03-12 DIAGNOSIS — Z0000 Encounter for general adult medical examination without abnormal findings: Secondary | ICD-10-CM | POA: Diagnosis not present

## 2023-03-12 DIAGNOSIS — I868 Varicose veins of other specified sites: Secondary | ICD-10-CM | POA: Diagnosis not present

## 2023-03-12 DIAGNOSIS — I10 Essential (primary) hypertension: Secondary | ICD-10-CM | POA: Diagnosis not present

## 2023-03-12 DIAGNOSIS — E559 Vitamin D deficiency, unspecified: Secondary | ICD-10-CM | POA: Diagnosis not present

## 2023-03-12 DIAGNOSIS — Z1389 Encounter for screening for other disorder: Secondary | ICD-10-CM | POA: Diagnosis not present

## 2023-03-12 DIAGNOSIS — J309 Allergic rhinitis, unspecified: Secondary | ICD-10-CM | POA: Diagnosis not present

## 2023-03-12 DIAGNOSIS — K219 Gastro-esophageal reflux disease without esophagitis: Secondary | ICD-10-CM | POA: Diagnosis not present

## 2023-03-21 DIAGNOSIS — L309 Dermatitis, unspecified: Secondary | ICD-10-CM | POA: Diagnosis not present

## 2023-03-21 DIAGNOSIS — F411 Generalized anxiety disorder: Secondary | ICD-10-CM | POA: Diagnosis not present

## 2023-03-21 DIAGNOSIS — J301 Allergic rhinitis due to pollen: Secondary | ICD-10-CM | POA: Diagnosis not present

## 2023-03-26 DIAGNOSIS — H1132 Conjunctival hemorrhage, left eye: Secondary | ICD-10-CM | POA: Diagnosis not present

## 2023-04-02 ENCOUNTER — Ambulatory Visit: Payer: Medicare HMO | Admitting: Podiatry

## 2023-04-02 ENCOUNTER — Encounter: Payer: Self-pay | Admitting: Podiatry

## 2023-04-02 DIAGNOSIS — B351 Tinea unguium: Secondary | ICD-10-CM

## 2023-04-02 DIAGNOSIS — L84 Corns and callosities: Secondary | ICD-10-CM

## 2023-04-02 DIAGNOSIS — I739 Peripheral vascular disease, unspecified: Secondary | ICD-10-CM | POA: Diagnosis not present

## 2023-04-02 DIAGNOSIS — Q828 Other specified congenital malformations of skin: Secondary | ICD-10-CM

## 2023-04-02 DIAGNOSIS — M79609 Pain in unspecified limb: Secondary | ICD-10-CM

## 2023-04-02 NOTE — Progress Notes (Signed)
Subjective:  Patient ID: Barbara Rangel, female    DOB: [birth year],  MRN: [id number]  Barbara Rangel presents to clinic today for at risk foot care. Patient has h/o PAD and callus(es) both feet, porokeratotic lesion(s) right lower extremity, and painful mycotic nails. Painful toenails interfere with ambulation. Aggravating factors include wearing enclosed shoe gear. Pain is relieved with periodic professional debridement. Painful callus(es) and porokeratotic lesion(s) are aggravated when weightbearing with and without shoegear. Pain is relieved with periodic professional debridement.  Chief Complaint  Patient presents with   Nail Problem    RFC PCP-Varadarajan PCP VST- 2 or 3 weeks ago   New problem(s): None.   PCP is Lorenda Ishihara, MD.  Allergies  Allergen Reactions   Moxifloxacin Hcl     Other reaction(s): headache   Nitrous Oxide Other (See Comments)    Pt passed out and had confusion. Took a few hours to wake her up.   Avelox [Moxifloxacin Hcl In Nacl] Other (See Comments)    Severe headache    Cephalexin Other (See Comments)    Caused headache   Other Other (See Comments)    Anesthesia, needs antiemetic med   Quinolones Other (See Comments)    Headaches?   Tramadol Nausea Only and Other (See Comments)    Nausea and Dizziness, too.   Trimethoprim Other (See Comments)    Rapid heartbeat.   Avelox [Moxifloxacin] Other (See Comments)   Celexa [Citalopram] Other (See Comments)   Prednisone     Cannot take steroids due to her open angle glaucoma   Tizanidine Hcl Other (See Comments)   Citalopram Hydrobromide Other (See Comments)    Pt can't remember what the side effect was.    Review of Systems: Negative except as noted in the HPI.  Objective:  There were no vitals filed for this visit. Barbara Rangel is a pleasant 77 y.o. female WD, WN in NAD. AAO x 3.  Vascular Examination: CFT <3 seconds b/l. DP/PT pulses faintly palpable b/l. Skin  temperature gradient warm to warm b/l. No pain with calf compression. No ischemia or gangrene. No cyanosis or clubbing noted b/l. Pedal hair sparse.   Neurological Examination: Sensation grossly intact b/l with 10 gram monofilament. Vibratory sensation intact b/l.   Dermatological Examination: Pedal skin warm and supple b/l.   No open wounds. No interdigital macerations.  Toenails 1-5 b/l thick, discolored, elongated with subungual debris and pain on dorsal palpation.    Hyperkeratotic lesion(s) bilateral great toes.  No erythema, no edema, no drainage, no fluctuance. Porokeratotic lesion(s) submet head 5 right foot. No erythema, no edema, no drainage, no fluctuance.  Musculoskeletal Examination: Muscle strength 5/5 to b/l LE. HAV with bunion bilaterally and hammertoes 2-5 b/l. Pes planus deformity noted bilateral LE. Plantar fat pad atrophy both feet.  Radiographs: None  Assessment/Plan: 1. Pain due to onychomycosis of nail   2. Callus   3. Porokeratosis   4. PVD (peripheral vascular disease) (HCC)     -Consent given for treatment as described below: -Examined patient. -Mycotic toenails 1-5 bilaterally were debrided in length and girth with sterile nail nippers and dremel without incident. -Callus(es) bilateral great toes pared utilizing sterile scalpel blade without complication or incident. Total number debrided =2. -Porokeratotic lesion(s) submet head 5 right foot pared and enucleated with sterile currette without incident. Total number of lesions debrided=1. -Patient/POA to call should there be question/concern in the interim.   Return in about 3 months (around 07/03/2023).  Freddie Breech, DPM

## 2023-05-07 DIAGNOSIS — H401132 Primary open-angle glaucoma, bilateral, moderate stage: Secondary | ICD-10-CM | POA: Diagnosis not present

## 2023-05-07 DIAGNOSIS — H2512 Age-related nuclear cataract, left eye: Secondary | ICD-10-CM | POA: Diagnosis not present

## 2023-05-20 DIAGNOSIS — H2512 Age-related nuclear cataract, left eye: Secondary | ICD-10-CM | POA: Diagnosis not present

## 2023-05-20 DIAGNOSIS — Z961 Presence of intraocular lens: Secondary | ICD-10-CM | POA: Diagnosis not present

## 2023-05-20 DIAGNOSIS — H401132 Primary open-angle glaucoma, bilateral, moderate stage: Secondary | ICD-10-CM | POA: Diagnosis not present

## 2023-06-12 ENCOUNTER — Ambulatory Visit: Payer: Medicare HMO | Admitting: Podiatry

## 2023-06-12 VITALS — BP 98/59

## 2023-06-12 DIAGNOSIS — I739 Peripheral vascular disease, unspecified: Secondary | ICD-10-CM | POA: Diagnosis not present

## 2023-06-12 DIAGNOSIS — L84 Corns and callosities: Secondary | ICD-10-CM | POA: Diagnosis not present

## 2023-06-12 DIAGNOSIS — Q828 Other specified congenital malformations of skin: Secondary | ICD-10-CM

## 2023-06-12 DIAGNOSIS — D2272 Melanocytic nevi of left lower limb, including hip: Secondary | ICD-10-CM | POA: Insufficient documentation

## 2023-06-12 DIAGNOSIS — H4010X0 Unspecified open-angle glaucoma, stage unspecified: Secondary | ICD-10-CM | POA: Insufficient documentation

## 2023-06-12 DIAGNOSIS — B351 Tinea unguium: Secondary | ICD-10-CM | POA: Diagnosis not present

## 2023-06-12 DIAGNOSIS — M79609 Pain in unspecified limb: Secondary | ICD-10-CM | POA: Diagnosis not present

## 2023-06-12 NOTE — Progress Notes (Signed)
  Subjective:  Patient ID: Barbara Rangel, female    DOB: 1946/01/14,  MRN: [id number]  76 y.o. female presents to clinic with at risk foot care. Patient has h/o PAD and painful porokeratotic lesion(s) right lower extremity and painful mycotic toenails that limit ambulation. Painful toenails interfere with ambulation. Aggravating factors include wearing enclosed shoe gear. Pain is relieved with periodic professional debridement. Painful porokeratotic lesions are aggravated when weightbearing with and without shoegear. Pain is relieved with periodic professional debridement. Chief Complaint  Patient presents with   Nail Problem    RFC,Referring Provider Lorenda Ishihara, MD,lov:05/24        New problem(s): None   PCP is Lorenda Ishihara, MD.  Allergies  Allergen Reactions   Moxifloxacin Hcl     Other reaction(s): headache   Nitrous Oxide Other (See Comments)    Pt passed out and had confusion. Took a few hours to wake her up.   Avelox [Moxifloxacin Hcl In Nacl] Other (See Comments)    Severe headache    Cephalexin Other (See Comments)    Caused headache   Other Other (See Comments)    Anesthesia, needs antiemetic med   Quinolones Other (See Comments)    Headaches?   Tramadol Nausea Only and Other (See Comments)    Nausea and Dizziness, too.   Trimethoprim Other (See Comments)    Rapid heartbeat.   Avelox [Moxifloxacin] Other (See Comments)   Celexa [Citalopram] Other (See Comments)   Prednisone     Cannot take steroids due to her open angle glaucoma   Tizanidine Hcl Other (See Comments)   Citalopram Hydrobromide Other (See Comments)    Pt can't remember what the side effect was.    Review of Systems: Negative except as noted in the HPI.   Objective:  Barbara Rangel is a pleasant 77 y.o. female WD, WN in NAD.Marland Kitchen  Vascular Examination: CFT <3 seconds b/l. DP/PT pulses faintly palpable b/l. Skin temperature gradient warm to warm b/l. No pain with calf  compression. No ischemia or gangrene. No cyanosis or clubbing noted b/l. Pedal hair sparse.   Neurological Examination: Sensation grossly intact b/l with 10 gram monofilament.  Dermatological Examination: Pedal skin with normal turgor, texture and tone b/l. Toenails 1-5 b/l thick, discolored, elongated with subungual debris and pain on dorsal palpation. No hyperkeratotic lesions noted b/l.   Hyperkeratotic lesion(s) bilateral great toes.  No erythema, no edema, no drainage, no fluctuance. Porokeratotic lesion(s) submet head 5 right foot. No erythema, no edema, no drainage, no fluctuance.  Musculoskeletal Examination: Muscle strength 5/5 to b/l LE. HAV with bunion deformity noted b/l LE. Hammertoe deformity noted 2-5 b/l.  Radiographs: None   Assessment:   1. Pain due to onychomycosis of nail   2. Callus   3. Porokeratosis   4. PVD (peripheral vascular disease) (HCC)    Plan:  -Patient was evaluated and treated. All patient's and/or POA's questions/concerns answered on today's visit. -Continue supportive shoe gear daily. -Toenails 1-5 b/l were debrided in length and girth with sterile nail nippers and dremel without iatrogenic bleeding.  -Callus(es) L hallux and R hallux pared utilizing sharp debridement with sterile blade without complication or incident. Total number debrided =2. -Porokeratotic lesion(s) submet head 5 right foot pared and enucleated with sterile currette without incident. Total number of lesions debrided=1. -Patient/POA to call should there be question/concern in the interim.  Return in about 9 weeks (around 08/14/2023).  Freddie Breech, DPM

## 2023-06-17 DIAGNOSIS — I10 Essential (primary) hypertension: Secondary | ICD-10-CM | POA: Diagnosis not present

## 2023-06-17 DIAGNOSIS — K219 Gastro-esophageal reflux disease without esophagitis: Secondary | ICD-10-CM | POA: Diagnosis not present

## 2023-06-17 DIAGNOSIS — Z809 Family history of malignant neoplasm, unspecified: Secondary | ICD-10-CM | POA: Diagnosis not present

## 2023-06-17 DIAGNOSIS — E785 Hyperlipidemia, unspecified: Secondary | ICD-10-CM | POA: Diagnosis not present

## 2023-06-17 DIAGNOSIS — H401132 Primary open-angle glaucoma, bilateral, moderate stage: Secondary | ICD-10-CM | POA: Diagnosis not present

## 2023-06-17 DIAGNOSIS — H269 Unspecified cataract: Secondary | ICD-10-CM | POA: Diagnosis not present

## 2023-06-17 DIAGNOSIS — H409 Unspecified glaucoma: Secondary | ICD-10-CM | POA: Diagnosis not present

## 2023-06-17 DIAGNOSIS — J301 Allergic rhinitis due to pollen: Secondary | ICD-10-CM | POA: Diagnosis not present

## 2023-06-17 DIAGNOSIS — Z853 Personal history of malignant neoplasm of breast: Secondary | ICD-10-CM | POA: Diagnosis not present

## 2023-06-17 DIAGNOSIS — H2512 Age-related nuclear cataract, left eye: Secondary | ICD-10-CM | POA: Diagnosis not present

## 2023-06-17 DIAGNOSIS — M8580 Other specified disorders of bone density and structure, unspecified site: Secondary | ICD-10-CM | POA: Diagnosis not present

## 2023-06-17 DIAGNOSIS — I739 Peripheral vascular disease, unspecified: Secondary | ICD-10-CM | POA: Diagnosis not present

## 2023-06-17 DIAGNOSIS — F411 Generalized anxiety disorder: Secondary | ICD-10-CM | POA: Diagnosis not present

## 2023-06-17 DIAGNOSIS — Z961 Presence of intraocular lens: Secondary | ICD-10-CM | POA: Diagnosis not present

## 2023-06-17 DIAGNOSIS — Z8249 Family history of ischemic heart disease and other diseases of the circulatory system: Secondary | ICD-10-CM | POA: Diagnosis not present

## 2023-06-18 DIAGNOSIS — H2512 Age-related nuclear cataract, left eye: Secondary | ICD-10-CM | POA: Diagnosis not present

## 2023-06-18 DIAGNOSIS — H25012 Cortical age-related cataract, left eye: Secondary | ICD-10-CM | POA: Diagnosis not present

## 2023-06-18 DIAGNOSIS — H401133 Primary open-angle glaucoma, bilateral, severe stage: Secondary | ICD-10-CM | POA: Diagnosis not present

## 2023-06-19 ENCOUNTER — Encounter: Payer: Self-pay | Admitting: Podiatry

## 2023-07-10 DIAGNOSIS — H25012 Cortical age-related cataract, left eye: Secondary | ICD-10-CM | POA: Diagnosis not present

## 2023-07-10 DIAGNOSIS — H25812 Combined forms of age-related cataract, left eye: Secondary | ICD-10-CM | POA: Diagnosis not present

## 2023-07-10 DIAGNOSIS — H2512 Age-related nuclear cataract, left eye: Secondary | ICD-10-CM | POA: Diagnosis not present

## 2023-07-15 DIAGNOSIS — D225 Melanocytic nevi of trunk: Secondary | ICD-10-CM | POA: Diagnosis not present

## 2023-07-15 DIAGNOSIS — D2271 Melanocytic nevi of right lower limb, including hip: Secondary | ICD-10-CM | POA: Diagnosis not present

## 2023-07-15 DIAGNOSIS — L578 Other skin changes due to chronic exposure to nonionizing radiation: Secondary | ICD-10-CM | POA: Diagnosis not present

## 2023-07-15 DIAGNOSIS — L814 Other melanin hyperpigmentation: Secondary | ICD-10-CM | POA: Diagnosis not present

## 2023-07-15 DIAGNOSIS — L821 Other seborrheic keratosis: Secondary | ICD-10-CM | POA: Diagnosis not present

## 2023-07-15 DIAGNOSIS — D2272 Melanocytic nevi of left lower limb, including hip: Secondary | ICD-10-CM | POA: Diagnosis not present

## 2023-07-18 NOTE — Progress Notes (Unsigned)
Office Note     CC:  follow up Requesting Provider:  Lorenda Ishihara,*  HPI: Barbara Rangel is a 77 y.o. (1946/05/06) female who presents for surveillance follow up for carotid artery stenosis.  She has been followed since 2018 for moderate Bilateral ICA stenosis. Initially her left was more significant that the right. She has been asymptomatic.   She denies any visual changes aside from her recent cataract surgery. No slurred speech, facial drooping, unilateral upper or lower extremity weakness or numbness. She recently underwent cataract surgery so presents today with glasses to help protect her eyes. She says she normally exercises regularly but has not been since her eye surgery. She hopes to return to it when she is cleared. She does not have any pain in her legs on ambulation or rest, not tissue loss. She does report some skin darkening and swelling in her legs bilaterally on prolonged ambulation. She does not elevate. She does wear some OTC compression stockings daily. She denies any associated discomfort.   She does report concerns about her blood pressure being low. She checks in regularly at home. She says she has some days that her SBP is in the 70's. Normally she says it ranges from 70's- 115-120. She denies any light headedness, syncope, headaches, dizziness. She is medically managed on Amlodipine 5 mg.   The pt is on a statin for cholesterol management.  The pt is on a daily aspirin.   Other AC:  none The pt is on CCB for hypertension.   The pt is not diabetic Tobacco hx:  never  Past Medical History:  Diagnosis Date   Arthritis    degenerative arthritis   Breast cancer, right breast (HCC) 04/2004   T2N1 diagnosed June 2005, ER PR + and Her 2 negative, post mastectomy with axillary node dissection (possibly 9 or 11 nodes removed, with possibly 7 or 9 involved) then treatment on NSABP B28 with dose dense adriamycin cytoxan followed by taxol, all of this Rx  in  [place]. Adjuvant arimidex begun Jan 2006. No radiation.    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   Other and unspecified general anesthetics causing adverse effect in therapeutic use    hard to wake up   PONV (postoperative nausea and vomiting)    Syncope 09/15/2012    Past Surgical History:  Procedure Laterality Date   ANKLE SURGERY  2011   left ankle with screws and plates   BREAST LUMPECTOMY     x2   cataract surgery  2005   right eye   COLONOSCOPY     COLONOSCOPY WITH PROPOFOL N/A 10/11/2014   Procedure: COLONOSCOPY WITH PROPOFOL;  Surgeon: Charolett Bumpers, MD;  Location: [facility];  Service: Endoscopy;  Laterality: N/A;   EYE SURGERY  2004   right eye-detached retina   left breast biopsy  2009   LUMBAR LAMINECTOMY/DECOMPRESSION MICRODISCECTOMY  10/31/2011   Procedure: LUMBAR LAMINECTOMY/DECOMPRESSION MICRODISCECTOMY;  Surgeon: Alvy Beal;  Location: [facility] OR;  Service: Orthopedics;  Laterality: Right;  L4-5 Decompression with Right Facet Decompression    MASTECTOMY  2005   right  d/t breast cancer;no sticks  to right arm LYMPH NODES REMOVED.   RETINAL DETACHMENT SURGERY  2004   right breast biopsy  2005   TUBAL LIGATION  1980    Social History   Socioeconomic History   Marital status: Married    Spouse name: Aneta Mins   Number of children: 4   Years of education: BS   Highest education level: Not on file  Occupational History   Occupation: Retired  Tobacco Use   Smoking status: Never    Passive exposure: Never   Smokeless tobacco: Never  Vaping Use   Vaping status: Never Used  Substance and Sexual Activity   Alcohol use: No   Drug use: No   Sexual activity: Not  Currently  Other Topics Concern   Not on file  Social History Narrative   Pt lives at home with spouse.   Caffeine Use: none   Social Determinants of Corporate investment banker Strain: Not on file  Food Insecurity: Not on file  Transportation Needs: Not on file  Physical Activity: Not on file  Stress: Not on file  Social Connections: Not on file  Intimate Partner Violence: Not on file    Family History  Problem Relation Age of Onset   Colon cancer Mother    Breast cancer Cousin        on dads side   Breast cancer Cousin        on moms side   Anesthesia problems Neg Hx    Hypotension Neg Hx    Malignant hyperthermia Neg Hx    Pseudochol deficiency Neg Hx     Current Outpatient Medications  Medication Sig Dispense Refill   acetaminophen (TYLENOL) 500 MG tablet Take 1,000 mg by mouth at bedtime.     amLODipine (NORVASC) 5 MG tablet Take 5 mg by mouth at bedtime.      AREXVY 120 MCG/0.5ML injection      aspirin EC 81 MG tablet Take 81 mg by mouth every morning.      atorvastatin (LIPITOR) 20 MG tablet Take 1 tablet by mouth every morning.     calcium citrate-vitamin D (CITRACAL+D) 315-200 MG-UNIT per tablet Take 2 tablets by mouth 2 (two) times daily.     carbamide peroxide (DEBROX) 6.5 % OTIC solution Place 5 drops into both ears 2 (two) times daily. 15 mL 0   cetirizine (ZYRTEC ALLERGY) 10 MG tablet Take 1 tablet every day by oral route.     Cranberry Extract 250 MG TABS Take 1 capsule by mouth at bedtime.      guaiFENesin (MUCINEX) 600 MG 12 hr tablet Take 1 tablet (600 mg total) by mouth 2 (two) times daily as needed.     levocetirizine (XYZAL) 5 MG tablet Take 5 mg by mouth every evening.     loteprednol (LOTEMAX) 0.5 % ophthalmic suspension Place 1 drop into the left eye 4 (four) times daily.     Multiple Vitamins-Minerals (MULTIVITAMINS THER. W/MINERALS) TABS Take 1 tablet by mouth every morning.     omeprazole (PRILOSEC) 20 MG capsule Take 1 capsule (20 mg total) by  mouth daily.     ROCKLATAN 0.02-0.005 % SOLN Apply 1 drop to eye at bedtime.     sertraline (ZOLOFT) 25 MG tablet Take 25 mg by mouth daily.     SHINGRIX injection      SIMBRINZA 1-0.2 % SUSP Apply 1 drop to eye 2 (two) times daily.     cyclobenzaprine (FLEXERIL) 5 MG tablet Take 5 mg by  mouth as needed. (Patient not taking: Reported on 07/22/2023)     dorzolamide-timolol (COSOPT) 22.3-6.8 MG/ML ophthalmic solution SMARTSIG:In Eye(s) (Patient not taking: Reported on 07/22/2023)     fluorouracil (EFUDEX) 5 % cream 1 application (Patient not taking: Reported on 07/22/2023)     No current facility-administered medications for this visit.    Allergies  Allergen Reactions   Moxifloxacin Hcl     Other reaction(s): headache   Nitrous Oxide Other (See Comments)    Pt passed out and had confusion. Took a few hours to wake her up.   Avelox [Moxifloxacin Hcl In Nacl] Other (See Comments)    Severe headache    Cephalexin Other (See Comments)    Caused headache   Other Other (See Comments)    Anesthesia, needs antiemetic med   Quinolones Other (See Comments)    Headaches?   Tramadol Nausea Only and Other (See Comments)    Nausea and Dizziness, too.   Trimethoprim Other (See Comments)    Rapid heartbeat.   Avelox [Moxifloxacin] Other (See Comments)   Celexa [Citalopram] Other (See Comments)   Prednisone     Cannot take steroids due to her open angle glaucoma   Tizanidine Hcl Other (See Comments)   Citalopram Hydrobromide Other (See Comments)    Pt can't remember what the side effect was.     REVIEW OF SYSTEMS:   [X]  denotes positive finding, [ ]  denotes negative finding Cardiac  Comments:  Chest pain or chest pressure:    Shortness of breath upon exertion:    Short of breath when lying flat:    Irregular heart rhythm:        Vascular    Pain in calf, thigh, or hip brought on by ambulation:    Pain in feet at night that wakes you up from your sleep:     Blood clot in your veins:     Leg swelling:         Pulmonary    Oxygen at home:    Productive cough:     Wheezing:         Neurologic    Sudden weakness in arms or legs:     Sudden numbness in arms or legs:     Sudden onset of difficulty speaking or slurred speech:    Temporary loss of vision in one eye:     Problems with dizziness:         Gastrointestinal    Blood in stool:     Vomited blood:         Genitourinary    Burning when urinating:     Blood in urine:        Psychiatric    Major depression:         Hematologic    Bleeding problems:    Problems with blood clotting too easily:        Skin    Rashes or ulcers:        Constitutional    Fever or chills:      PHYSICAL EXAMINATION:  Vitals:   07/22/23 1020  BP: 109/63  Pulse: 66  Resp: 18  Temp: 98.3 F (36.8 C)  TempSrc: Temporal  SpO2: 98%  Weight: 124 lb 12.8 oz (56.6 kg)  Height: 5\' 4"  (1.626 m)    General:  WDWN in NAD; vital signs documented above Gait: Normal HENT: WNL, normocephalic Pulmonary: normal non-labored breathing , without wheezing Cardiac: regular HR Abdomen: soft Vascular Exam/Pulses: Dp and  PT pulses bilaterally, she has hyperpigmentation of bilateral lower legs, scattered spider and reticular veins of BLE Extremities: without ischemic changes, without Gangrene , without cellulitis; without open wounds;  Musculoskeletal: no muscle wasting or atrophy  Neurologic: A&O X 3 Psychiatric:  The pt has Normal affect.   Non-Invasive Vascular Imaging:   VAS US Carotid Duplex: Summary:  Right Carotid: Velocities in the right ICA are consistent with a 1-39% stenosis. Tortuosity of the distal ICA resulting in increased velocities suggestive of a 40-59% stenosis.   Left Carotid: Velocities in the left ICA are consistent with a 1-39% stenosis. Velocities in the mid/distal ICA are suggestive of a 60-79% stenosis, however, vessel is tortuous and with bead-like appearance indicative of FMD.   Vertebrals:  Bilateral  vertebral arteries demonstrate antegrade flow.  Subclavians: Normal flow hemodynamics were seen in bilateral subclavian arteries.    ASSESSMENT/PLAN:: 77 y.o. female here for follow up for carotid artery stenosis. She is without any associated symptoms. Her duplex shows 1-39% right ICA stenosis with velocities in distal ICA of 40-59%. Her ICA is known to be very tortuous. Left ICA with 60-79% stenosis. Both of these findings are essentially unchanged from her prior studies. She has normal flow in her vertebral and subclavian arteries - Recommend she follow up with PCP regarding her low blood pressure - She was measured today for compression stockings 15-20 mmHg and have encourage her to elevate her legs daily - She will continue her Aspirin and Statin - She knows signs and symptoms of TIA and understands should these occur to seek immediate medical attention - She will follow up in 6 months with repeat carotid duplex   Graceann Congress, PA-C [facility] [phone]  Clinic MD:   Steve Rattler

## 2023-07-22 ENCOUNTER — Ambulatory Visit: Payer: Medicare HMO

## 2023-07-22 ENCOUNTER — Ambulatory Visit (HOSPITAL_COMMUNITY)
Admission: RE | Admit: 2023-07-22 | Discharge: 2023-07-22 | Disposition: A | Discharge status: Home / Self Care | Payer: Medicare HMO | Source: Ambulatory Visit | Attending: Vascular Surgery | Admitting: Vascular Surgery

## 2023-07-22 VITALS — BP 109/63 | HR 66 | Temp 98.3°F | Resp 18 | Ht 64.0 in | Wt 124.8 lb

## 2023-07-22 DIAGNOSIS — I6523 Occlusion and stenosis of bilateral carotid arteries: Secondary | ICD-10-CM | POA: Insufficient documentation

## 2023-07-22 DIAGNOSIS — M7989 Other specified soft tissue disorders: Secondary | ICD-10-CM

## 2023-07-25 DIAGNOSIS — I10 Essential (primary) hypertension: Secondary | ICD-10-CM | POA: Diagnosis not present

## 2023-07-25 DIAGNOSIS — Z23 Encounter for immunization: Secondary | ICD-10-CM | POA: Diagnosis not present

## 2023-07-30 ENCOUNTER — Other Ambulatory Visit: Payer: Self-pay

## 2023-07-30 DIAGNOSIS — I6523 Occlusion and stenosis of bilateral carotid arteries: Secondary | ICD-10-CM

## 2023-08-27 ENCOUNTER — Ambulatory Visit (INDEPENDENT_AMBULATORY_CARE_PROVIDER_SITE_OTHER): Payer: Medicare HMO | Admitting: Podiatry

## 2023-08-27 ENCOUNTER — Encounter: Payer: Self-pay | Admitting: Podiatry

## 2023-08-27 DIAGNOSIS — L84 Corns and callosities: Secondary | ICD-10-CM

## 2023-08-27 DIAGNOSIS — M79609 Pain in unspecified limb: Secondary | ICD-10-CM

## 2023-08-27 DIAGNOSIS — I739 Peripheral vascular disease, unspecified: Secondary | ICD-10-CM | POA: Diagnosis not present

## 2023-08-27 DIAGNOSIS — Q828 Other specified congenital malformations of skin: Secondary | ICD-10-CM

## 2023-08-27 DIAGNOSIS — B351 Tinea unguium: Secondary | ICD-10-CM

## 2023-08-27 NOTE — Progress Notes (Signed)
Subjective:  Patient ID: Barbara Rangel, female    DOB: 03-Mar-1946,  MRN: [id number]  Barbara Rangel presents to clinic today for at risk foot care. Patient has h/o PAD and callus(es) of both feet, porokeratotic lesion(s) right foot, and painful mycotic nails. Painful toenails interfere with ambulation. Aggravating factors include wearing enclosed shoe gear. Pain is relieved with periodic professional debridement. Painful callus(es) and porokeratotic lesion(s) are aggravated when weightbearing with and without shoegear. Pain is relieved with periodic professional debridement.  Chief Complaint  Patient presents with   RFC    RFC-no question or concerns, has callosus on both great toes   New problem(s): None.   PCP is Lorenda Ishihara, MD.  Allergies  Allergen Reactions   Moxifloxacin Hcl     Other reaction(s): headache   Nitrous Oxide Other (See Comments)    Pt passed out and had confusion. Took a few hours to wake her up.   Avelox [Moxifloxacin Hcl In Nacl] Other (See Comments)    Severe headache    Cephalexin Other (See Comments)    Caused headache   Other Other (See Comments)    Anesthesia, needs antiemetic med   Quinolones Other (See Comments)    Headaches?   Tramadol Nausea Only and Other (See Comments)    Nausea and Dizziness, too.   Trimethoprim Other (See Comments)    Rapid heartbeat.   Avelox [Moxifloxacin] Other (See Comments)   Celexa [Citalopram] Other (See Comments)   Prednisone     Cannot take steroids due to her open angle glaucoma   Tizanidine Hcl Other (See Comments)   Citalopram Hydrobromide Other (See Comments)    Pt can't remember what the side effect was.    Review of Systems: Negative except as noted in the HPI.  Objective: No changes noted in today's physical examination. There were no vitals filed for this visit. COUA CAUDILL is a pleasant 77 y.o. female WD, WN in NAD. AAO x 3.  Vascular Examination: CFT <3 seconds b/l. DP/PT  pulses faintly palpable b/l. Skin temperature gradient warm to warm b/l. No pain with calf compression. No ischemia or gangrene. No cyanosis or clubbing noted b/l. Pedal hair sparse.   Neurological Examination: Sensation grossly intact b/l with 10 gram monofilament.   Dermatological Examination: Pedal skin warm and supple b/l.   No open wounds. No interdigital macerations.  Toenails 1-5 b/l thick, discolored, elongated with subungual debris and pain on dorsal palpation.    Hyperkeratotic lesion(s) bilateral great toes.  No erythema, no edema, no drainage, no fluctuance. Porokeratotic lesion(s) submet head 5 right foot. No erythema, no edema, no drainage, no fluctuance.  Musculoskeletal Examination: Muscle strength 5/5 to all lower extremity muscle groups bilaterally. HAV with bunion deformity noted b/l LE. Hammertoe deformity noted 2-5 b/l.  Radiographs: None  Assessment/Plan: 1. Pain due to onychomycosis of nail   2. Callus   3. Porokeratosis   4. PVD (peripheral vascular disease) (HCC)     -Consent given for treatment as described below: -Examined patient. -Patient to continue soft, supportive shoe gear daily. -Mycotic toenails 1-5 bilaterally were debrided in length and girth with sterile nail nippers and dremel without incident. -Callus(es) bilateral great toes pared utilizing sterile scalpel blade without complication or incident. Total number debrided =2. -Porokeratotic lesion(s) submet head 5 right foot pared and enucleated with sterile currette without incident. Total number of lesions debrided=1. -Patient/POA to call should there be question/concern in the interim.   Return in about 3 months (around  11/27/2023).  Freddie Breech, DPM

## 2023-09-01 ENCOUNTER — Encounter: Payer: Self-pay | Admitting: Podiatry

## 2023-09-23 DIAGNOSIS — Z0100 Encounter for examination of eyes and vision without abnormal findings: Secondary | ICD-10-CM | POA: Diagnosis not present

## 2023-09-30 DIAGNOSIS — Z961 Presence of intraocular lens: Secondary | ICD-10-CM | POA: Diagnosis not present

## 2023-09-30 DIAGNOSIS — H401132 Primary open-angle glaucoma, bilateral, moderate stage: Secondary | ICD-10-CM | POA: Diagnosis not present

## 2023-10-16 DIAGNOSIS — Z800 Family history of malignant neoplasm of digestive organs: Secondary | ICD-10-CM | POA: Diagnosis not present

## 2023-10-16 DIAGNOSIS — K219 Gastro-esophageal reflux disease without esophagitis: Secondary | ICD-10-CM | POA: Diagnosis not present

## 2023-10-20 DIAGNOSIS — Z23 Encounter for immunization: Secondary | ICD-10-CM | POA: Diagnosis not present

## 2023-10-20 DIAGNOSIS — I10 Essential (primary) hypertension: Secondary | ICD-10-CM | POA: Diagnosis not present

## 2023-10-20 DIAGNOSIS — M150 Primary generalized (osteo)arthritis: Secondary | ICD-10-CM | POA: Diagnosis not present

## 2023-10-20 DIAGNOSIS — F411 Generalized anxiety disorder: Secondary | ICD-10-CM | POA: Diagnosis not present

## 2023-10-20 DIAGNOSIS — Z800 Family history of malignant neoplasm of digestive organs: Secondary | ICD-10-CM | POA: Diagnosis not present

## 2023-10-20 DIAGNOSIS — E785 Hyperlipidemia, unspecified: Secondary | ICD-10-CM | POA: Diagnosis not present

## 2023-10-20 DIAGNOSIS — Z0000 Encounter for general adult medical examination without abnormal findings: Secondary | ICD-10-CM | POA: Diagnosis not present

## 2023-10-20 DIAGNOSIS — H9190 Unspecified hearing loss, unspecified ear: Secondary | ICD-10-CM | POA: Diagnosis not present

## 2023-11-03 DIAGNOSIS — R519 Headache, unspecified: Secondary | ICD-10-CM | POA: Diagnosis not present

## 2023-11-03 DIAGNOSIS — I889 Nonspecific lymphadenitis, unspecified: Secondary | ICD-10-CM | POA: Diagnosis not present

## 2023-11-03 DIAGNOSIS — I10 Essential (primary) hypertension: Secondary | ICD-10-CM | POA: Diagnosis not present

## 2023-12-01 DIAGNOSIS — H10503 Unspecified blepharoconjunctivitis, bilateral: Secondary | ICD-10-CM | POA: Diagnosis not present

## 2023-12-02 DIAGNOSIS — J309 Allergic rhinitis, unspecified: Secondary | ICD-10-CM | POA: Diagnosis not present

## 2023-12-02 DIAGNOSIS — H1013 Acute atopic conjunctivitis, bilateral: Secondary | ICD-10-CM | POA: Diagnosis not present

## 2023-12-02 DIAGNOSIS — L309 Dermatitis, unspecified: Secondary | ICD-10-CM | POA: Diagnosis not present

## 2023-12-05 DIAGNOSIS — H0100A Unspecified blepharitis right eye, upper and lower eyelids: Secondary | ICD-10-CM | POA: Diagnosis not present

## 2023-12-05 DIAGNOSIS — H01115 Allergic dermatitis of left lower eyelid: Secondary | ICD-10-CM | POA: Diagnosis not present

## 2023-12-05 DIAGNOSIS — H0100B Unspecified blepharitis left eye, upper and lower eyelids: Secondary | ICD-10-CM | POA: Diagnosis not present

## 2023-12-05 DIAGNOSIS — H01111 Allergic dermatitis of right upper eyelid: Secondary | ICD-10-CM | POA: Diagnosis not present

## 2023-12-05 DIAGNOSIS — H01114 Allergic dermatitis of left upper eyelid: Secondary | ICD-10-CM | POA: Diagnosis not present

## 2023-12-05 DIAGNOSIS — H01112 Allergic dermatitis of right lower eyelid: Secondary | ICD-10-CM | POA: Diagnosis not present

## 2023-12-09 ENCOUNTER — Ambulatory Visit: Payer: Medicare HMO | Admitting: Podiatry

## 2023-12-09 ENCOUNTER — Encounter: Payer: Self-pay | Admitting: Podiatry

## 2023-12-09 VITALS — Ht 64.0 in

## 2023-12-09 DIAGNOSIS — Q828 Other specified congenital malformations of skin: Secondary | ICD-10-CM

## 2023-12-09 DIAGNOSIS — I739 Peripheral vascular disease, unspecified: Secondary | ICD-10-CM

## 2023-12-09 DIAGNOSIS — B351 Tinea unguium: Secondary | ICD-10-CM | POA: Diagnosis not present

## 2023-12-09 DIAGNOSIS — L84 Corns and callosities: Secondary | ICD-10-CM | POA: Diagnosis not present

## 2023-12-09 DIAGNOSIS — M79609 Pain in unspecified limb: Secondary | ICD-10-CM

## 2023-12-12 NOTE — Progress Notes (Signed)
Subjective:  Patient ID: Barbara Rangel, female    DOB: 03-10-1946,  MRN: [id number]  78 y.o. female presents with at risk foot care. Patient has h/o PAD and callus(es) b/l feet, porokeratotic lesion(s) right foot, and painful mycotic nails. Painful toenails interfere with ambulation. Aggravating factors include wearing enclosed shoe gear. Pain is relieved with periodic professional debridement. Painful callus(es) and porokeratotic lesion(s) are aggravated when weightbearing with and without shoegear. Pain is relieved with periodic professional debridement. Chief Complaint  Patient presents with   RFC    She is here for " regular maintenance nail care", PCP is Dr. Seth Bake,  and last Ov was 10/24     PCP: Lorenda Ishihara, MD.  New problem(s): None.   Review of Systems: Negative except as noted in the HPI.   Allergies  Allergen Reactions   Moxifloxacin Hcl     Other reaction(s): headache   Nitrous Oxide Other (See Comments)    Pt passed out and had confusion. Took a few hours to wake her up.   Avelox [Moxifloxacin Hcl In Nacl] Other (See Comments)    Severe headache    Cephalexin Other (See Comments)    Caused headache   Other Other (See Comments)    Anesthesia, needs antiemetic med   Quinolones Other (See Comments)    Headaches?   Tramadol Nausea Only and Other (See Comments)    Nausea and Dizziness, too.   Trimethoprim Other (See Comments)    Rapid heartbeat.   Avelox [Moxifloxacin] Other (See Comments)   Celexa [Citalopram] Other (See Comments)   Prednisone     Cannot take steroids due to her open angle glaucoma   Tizanidine Hcl Other (See Comments)   Citalopram Hydrobromide Other (See Comments)    Pt can't remember what the side effect was.    Objective:  There were no vitals filed for this visit. Constitutional Patient is a pleasant 78 y.o. female WD, WN in NAD. AAO x 3.  Vascular Capillary fill time to digits <3 seconds.  DP/PT pulse(s) are faintly palpable  b/l lower extremities. Pedal hair absent b/l. Lower extremity skin temperature gradient warm to cool b/l. No pain with calf compression b/l. No cyanosis or clubbing noted. No ischemia nor gangrene noted b/l.   Neurologic Protective sensation intact 5/5 intact bilaterally with 10g monofilament b/l. Vibratory sensation intact b/l. No clonus b/l.   Dermatologic Pedal skin is thin, shiny and atrophic b/l.  No open wounds b/l lower extremities. No interdigital macerations b/l lower extremities. Toenails 1-5 b/l elongated, discolored, dystrophic, thickened, crumbly with subungual debris and tenderness to dorsal palpation. Hyperkeratotic lesion(s) bilateral great toes.  No erythema, no edema, no drainage, no fluctuance. Porokeratotic lesion(s) submet head 5 right foot. No erythema, no edema, no drainage, no fluctuance.  Orthopedic: Normal muscle strength 5/5 to all lower extremity muscle groups bilaterally. HAV with bunion deformity noted b/l LE. Hammertoe deformity noted 2-5 b/l.   Last HgA1c:      No data to display           Assessment:   1. Pain due to onychomycosis of nail   2. Porokeratosis   3. Callus   4. PVD (peripheral vascular disease) (HCC)    Plan:  -Patient was evaluated today. All questions/concerns addressed on today's visit. -Continue supportive shoe gear daily. -Toenails 1-5 b/l were debrided in length and girth with sterile nail nippers and dremel without iatrogenic bleeding.  -Callus(es) left great toe and right great toe pared utilizing sterile scalpel  blade without complication or incident. Total number debrided =2. -Porokeratotic lesion(s) submet head 5 right foot pared and enucleated with sterile currette without incident. Total number of lesions debrided=1. -Patient/POA to call should there be question/concern in the interim.  Return in about 3 months (around 03/08/2024).  Freddie Breech, DPM      [facility] LOCATION: [address], [place] [address]                   Office [phone]   [facility] LOCATION: [address] [place], [place] [address] Office [phone]

## 2023-12-19 DIAGNOSIS — H1789 Other corneal scars and opacities: Secondary | ICD-10-CM | POA: Diagnosis not present

## 2023-12-19 LAB — HEMOGLOBIN A1C: Hemoglobin A1C: 5.5

## 2023-12-19 NOTE — Progress Notes (Signed)
 Patient given advice during screening about high blood pressure.

## 2024-01-06 ENCOUNTER — Other Ambulatory Visit: Payer: Self-pay | Admitting: Hematology and Oncology

## 2024-01-06 DIAGNOSIS — Z1231 Encounter for screening mammogram for malignant neoplasm of breast: Secondary | ICD-10-CM

## 2024-01-08 ENCOUNTER — Other Ambulatory Visit: Payer: Self-pay | Admitting: Internal Medicine

## 2024-01-08 DIAGNOSIS — M85859 Other specified disorders of bone density and structure, unspecified thigh: Secondary | ICD-10-CM

## 2024-01-13 DIAGNOSIS — Z961 Presence of intraocular lens: Secondary | ICD-10-CM | POA: Diagnosis not present

## 2024-01-13 DIAGNOSIS — H401132 Primary open-angle glaucoma, bilateral, moderate stage: Secondary | ICD-10-CM | POA: Diagnosis not present

## 2024-01-27 ENCOUNTER — Ambulatory Visit: Payer: Medicare HMO | Admitting: Physician Assistant

## 2024-01-27 ENCOUNTER — Ambulatory Visit (HOSPITAL_COMMUNITY)
Admission: RE | Admit: 2024-01-27 | Discharge: 2024-01-27 | Disposition: A | Discharge status: Home / Self Care | Payer: Medicare HMO | Source: Ambulatory Visit | Attending: Vascular Surgery | Admitting: Vascular Surgery

## 2024-01-27 VITALS — BP 121/70 | HR 72 | Temp 98.1°F | Resp 16 | Ht 64.0 in | Wt 126.0 lb

## 2024-01-27 DIAGNOSIS — I6523 Occlusion and stenosis of bilateral carotid arteries: Secondary | ICD-10-CM

## 2024-01-27 NOTE — Progress Notes (Unsigned)
 Office Note   History of Present Illness   Barbara Rangel is a 78 y.o. ([birth year]) female who presents for surveillance of carotid artery stenosis.   The patient returns today for follow up. He/She denies any recent CVA or TIA diagnosis. The patient also denies any recent strokelike symptoms such as slurred speech, facial droop, sudden visual changes, or sudden weakness/numbness.  Current Outpatient Medications  Medication Sig Dispense Refill   acetaminophen (TYLENOL) 500 MG tablet Take 1,000 mg by mouth at bedtime.     amLODipine (NORVASC) 5 MG tablet Take 5 mg by mouth at bedtime.      AREXVY 120 MCG/0.5ML injection      aspirin EC 81 MG tablet Take 81 mg by mouth every morning.      atorvastatin (LIPITOR) 20 MG tablet Take 1 tablet by mouth every morning.     calcium citrate-vitamin D (CITRACAL+D) 315-200 MG-UNIT per tablet Take 2 tablets by mouth 2 (two) times daily.     carbamide peroxide (DEBROX) 6.5 % OTIC solution Place 5 drops into both ears 2 (two) times daily. 15 mL 0   Cranberry Extract 250 MG TABS Take 1 capsule by mouth at bedtime.      fexofenadine (ALLEGRA ODT) 30 MG disintegrating tablet Take 30 mg by mouth daily.     guaiFENesin (MUCINEX) 600 MG 12 hr tablet Take 1 tablet (600 mg total) by mouth 2 (two) times daily as needed.     loteprednol (LOTEMAX) 0.5 % ophthalmic suspension Place 1 drop into the left eye 4 (four) times daily.     Multiple Vitamins-Minerals (MULTIVITAMINS THER. W/MINERALS) TABS Take 1 tablet by mouth every morning.     omeprazole (PRILOSEC) 20 MG capsule Take 1 capsule (20 mg total) by mouth daily.     sertraline (ZOLOFT) 25 MG tablet Take 25 mg by mouth daily.     timolol (BETIMOL) 0.25 % ophthalmic solution 1-2 drops 2 (two) times daily.     No current facility-administered medications for this visit.    ***REVIEW OF SYSTEMS (negative unless checked):   Cardiac:  []  Chest pain or chest pressure? []  Shortness of breath upon  activity? []  Shortness of breath when lying flat? []  Irregular heart rhythm?  Vascular:  []  Pain in calf, thigh, or hip brought on by walking? []  Pain in feet at night that wakes you up from your sleep? []  Blood clot in your veins? []  Leg swelling?  Pulmonary:  []  Oxygen at home? []  Productive cough? []  Wheezing?  Neurologic:  []  Sudden weakness in arms or legs? []  Sudden numbness in arms or legs? []  Sudden onset of difficult speaking or slurred speech? []  Temporary loss of vision in one eye? []  Problems with dizziness?  Gastrointestinal:  []  Blood in stool? []  Vomited blood?  Genitourinary:  []  Burning when urinating? []  Blood in urine?  Psychiatric:  []  Major depression  Hematologic:  []  Bleeding problems? []  Problems with blood clotting?  Dermatologic:  []  Rashes or ulcers?  Constitutional:  []  Fever or chills?  Ear/Nose/Throat:  []  Change in hearing? []  Nose bleeds? []  Sore throat?  Musculoskeletal:  []  Back pain? []  Joint pain? []  Muscle pain?   Physical Examination  *** Vitals:   01/27/24 1023  BP: 121/70  Pulse: 72  Resp: 16  Temp: 98.1 F (36.7 C)  TempSrc: Temporal  SpO2: 98%  Weight: 126 lb (57.2 kg)  Height: 5\' 4"  (1.626 m)   ***Body mass index is 21.63 kg/m.  General:  WDWN in NAD; vital signs documented above Gait: Not observed HENT: WNL, normocephalic Pulmonary: normal non-labored breathing , without rales, rhonchi,  wheezing Cardiac: {Desc; regular/irreg:14544} HR, without murmurs {With/Without:20273} carotid bruit*** Abdomen: soft, NT, no masses Skin: {With/Without:20273} rashes Vascular Exam/Pulses: palpable radial pulses bilaterally Extremities: {With/Without:20273} ischemic changes, {With/Without:20273} gangrene , {With/Without:20273} cellulitis; {With/Without:20273} open wounds;  Musculoskeletal: no muscle wasting or atrophy  Neurologic: A&O X 3;  No focal weakness or paresthesias are detected Psychiatric:  The pt  has {Desc; normal/abnormal:11317::"Normal"} affect.  Non-Invasive Vascular Imaging   Bilateral Carotid Duplex (***):  R ICA stenosis:  {Stenosis:19197::"Occluded","80-99%","60-79%","40-59%","1-39%"} R VA: *** patent and antegrade L ICA stenosis:  {Stenosis:19197::"Occluded","80-99%","60-79%","40-59%","1-39%"} L VA: *** patent and antegrade   Medical Decision Making   Barbara Rangel is a 78 y.o. female who presents for surveillance of carotid artery stenosis  Based on the patient's vascular studies, their carotid artery stenosis is *** She/he denies any strokelike symptoms such as slurred speech, facial droop, sudden visual changes, or sudden weakness/numbness. No recent diagnosis of CVA or TIA. She/he has no neuro deficits on exam. There are palpable and equal radial pulses bilaterally They will continue their *** and follow up with our office in 6 months with carotid duplex   Loel Dubonnet PA-C [facility] Office: [phone]  Clinic MD: ***

## 2024-01-29 ENCOUNTER — Other Ambulatory Visit: Payer: Self-pay | Admitting: *Deleted

## 2024-01-29 DIAGNOSIS — I6523 Occlusion and stenosis of bilateral carotid arteries: Secondary | ICD-10-CM

## 2024-02-11 NOTE — Progress Notes (Signed)
 Pt attended 12/19/23 screening event with BP of 144/77 & A1C was 5.5. Pt noted at event that she does have a PCP. At event pt did not indicate any SDOH needs. Pt also noted that she is not a smoker and listed Medicare as her insurance at the event.   Per chart review pt does have a PCP (Rupashree [facility]), insurance, and is not a smoker. Pt's last appt with PCP was 12/02/2023  and has multiple upcoming appts. Pt does not indicate any SDOH needs at this time.  No additional pt f/u to be scheduled at this time per health equity protocol.

## 2024-02-19 ENCOUNTER — Ambulatory Visit
Admission: RE | Admit: 2024-02-19 | Discharge: 2024-02-19 | Disposition: A | Discharge status: Home / Self Care | Payer: Medicare HMO | Source: Ambulatory Visit | Attending: Hematology and Oncology | Admitting: Hematology and Oncology

## 2024-02-19 DIAGNOSIS — Z1231 Encounter for screening mammogram for malignant neoplasm of breast: Secondary | ICD-10-CM | POA: Diagnosis not present

## 2024-03-01 DIAGNOSIS — M79606 Pain in leg, unspecified: Secondary | ICD-10-CM | POA: Diagnosis not present

## 2024-03-01 DIAGNOSIS — M25559 Pain in unspecified hip: Secondary | ICD-10-CM | POA: Diagnosis not present

## 2024-03-08 ENCOUNTER — Encounter: Payer: Self-pay | Admitting: Podiatry

## 2024-03-08 ENCOUNTER — Ambulatory Visit: Payer: Medicare HMO | Admitting: Podiatry

## 2024-03-08 VITALS — Ht 64.0 in | Wt 126.0 lb

## 2024-03-08 DIAGNOSIS — M79609 Pain in unspecified limb: Secondary | ICD-10-CM | POA: Diagnosis not present

## 2024-03-08 DIAGNOSIS — Q828 Other specified congenital malformations of skin: Secondary | ICD-10-CM

## 2024-03-08 DIAGNOSIS — B351 Tinea unguium: Secondary | ICD-10-CM | POA: Diagnosis not present

## 2024-03-08 DIAGNOSIS — L84 Corns and callosities: Secondary | ICD-10-CM | POA: Diagnosis not present

## 2024-03-08 DIAGNOSIS — I739 Peripheral vascular disease, unspecified: Secondary | ICD-10-CM

## 2024-03-11 ENCOUNTER — Inpatient Hospital Stay: Payer: Medicare HMO | Attending: Hematology and Oncology | Admitting: Hematology and Oncology

## 2024-03-11 VITALS — BP 156/80 | HR 81 | Temp 98.3°F | Resp 18 | Ht 64.0 in | Wt 128.2 lb

## 2024-03-11 DIAGNOSIS — M545 Low back pain, unspecified: Secondary | ICD-10-CM | POA: Diagnosis not present

## 2024-03-11 DIAGNOSIS — Z9221 Personal history of antineoplastic chemotherapy: Secondary | ICD-10-CM | POA: Diagnosis not present

## 2024-03-11 DIAGNOSIS — Z853 Personal history of malignant neoplasm of breast: Secondary | ICD-10-CM | POA: Diagnosis not present

## 2024-03-11 DIAGNOSIS — F4323 Adjustment disorder with mixed anxiety and depressed mood: Secondary | ICD-10-CM | POA: Diagnosis not present

## 2024-03-11 DIAGNOSIS — M47816 Spondylosis without myelopathy or radiculopathy, lumbar region: Secondary | ICD-10-CM | POA: Insufficient documentation

## 2024-03-11 DIAGNOSIS — G8929 Other chronic pain: Secondary | ICD-10-CM | POA: Diagnosis not present

## 2024-03-11 DIAGNOSIS — M8580 Other specified disorders of bone density and structure, unspecified site: Secondary | ICD-10-CM | POA: Insufficient documentation

## 2024-03-11 NOTE — Assessment & Plan Note (Signed)
 Stage IIIa right breast cancer T2 N2 M0 diagnosed in June 2005 in [place] treated with mastectomy followed by adjuvant chemotherapy. 7 on 9 lymph nodes were apparently involved., Did not get adjuvant radiation (Unknown reasons). Arimidex  1 mg daily started January 2006 stopped 02/26/2019   Osteopenia: Patient is on weightbearing exercise program.  Bone density 02/17/2020: T score -1.5: Stable Bone density scheduled for October 2025   Breast cancer surveillance: 1.  Breast exam 03/11/2024:  Benign. 2.  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 Return to clinic in  1 year for follow-up

## 2024-03-11 NOTE — Progress Notes (Signed)
 Patient Care Team: Arva Lathe, MD as PCP - General (Internal Medicine) Cameron Cea, MD as Consulting Physician (Hematology and Oncology)  DIAGNOSIS:  Encounter Diagnosis  Name Primary?   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, unspecified estrogen receptor status (HCC) Yes    SUMMARY OF ONCOLOGIC HISTORY: Oncology History  Breast cancer, right breast (HCC)  01/10/2004 Initial Diagnosis   Patient found right breast cancer on breast self exam after negative mammogram same year, diagnosis March 2005 in [place]   04/26/2004 Surgery   Right mastectomy: T2 N2a M0 ER/PR positive HER-2 negative stage IIIa; 9 lymph nodes were involved (did not get radiation)   05/18/2004 - 09/21/2004 Chemotherapy   NSABP B 28 clinical trial with dose dense Adriamycin and Cytoxan followed by Taxol    11/12/2004 - 02/26/2019 Anti-estrogen oral therapy   Arimidex  1 mg daily     CHIEF COMPLIANT: Surveillance of breast cancer  HISTORY OF PRESENT ILLNESS:   History of Present Illness Barbara Rangel is a 78 year old female with degenerative arthritis who presents with back pain.  Degenerative arthritis affects the L4-L5 region, causing significant back pain. An x-ray was ordered by her primary physician, and she has undergone an MRI for similar issues in the past. Approximately ten years ago, an orthopedic surgeon removed a cyst from her nerve in this area, with a noted possibility of recurrence.     ALLERGIES:  is allergic to moxifloxacin hcl, nitrous oxide, avelox [moxifloxacin hcl in nacl], cephalexin , other, quinolones, tramadol, trimethoprim, avelox [moxifloxacin], celexa [citalopram], prednisone, tizanidine hcl, and citalopram hydrobromide.  MEDICATIONS:  Current Outpatient Medications  Medication Sig Dispense Refill   acetaminophen  (TYLENOL ) 500 MG tablet Take 1,000 mg by mouth at bedtime.     amLODipine  (NORVASC ) 5 MG tablet Take 5 mg by  mouth at bedtime.      AREXVY 120 MCG/0.5ML injection      aspirin EC 81 MG tablet Take 81 mg by mouth every morning.      atorvastatin (LIPITOR) 20 MG tablet Take 1 tablet by mouth every morning.     calcium  citrate-vitamin D  (CITRACAL+D) 315-200 MG-UNIT per tablet Take 2 tablets by mouth 2 (two) times daily.     carbamide peroxide (DEBROX) 6.5 % OTIC solution Place 5 drops into both ears 2 (two) times daily. 15 mL 0   Cranberry Extract 250 MG TABS Take 1 capsule by mouth at bedtime.      fexofenadine  (ALLEGRA  ODT) 30 MG disintegrating tablet Take 30 mg by mouth daily.     guaiFENesin  (MUCINEX ) 600 MG 12 hr tablet Take 1 tablet (600 mg total) by mouth 2 (two) times daily as needed.     Multiple Vitamins-Minerals (MULTIVITAMINS THER. W/MINERALS) TABS Take 1 tablet by mouth every morning.     omeprazole  (PRILOSEC) 20 MG capsule Take 1 capsule (20 mg total) by mouth daily.     sertraline (ZOLOFT) 25 MG tablet Take 25 mg by mouth daily.     timolol (BETIMOL) 0.25 % ophthalmic solution 1-2 drops 2 (two) times daily.     No current facility-administered medications for this visit.    PHYSICAL EXAMINATION: ECOG PERFORMANCE STATUS: 1 - Symptomatic but completely ambulatory  Vitals:   03/11/24 1130 03/11/24 1132  BP: (!) 168/64 (!) 156/80  Pulse: 81   Resp: 18   Temp: 98.3 F (36.8 C)   SpO2: 100%    Filed Weights   03/11/24 1130  Weight: 128 lb  3.2 oz (58.2 kg)     LABORATORY DATA:  I have reviewed the data as listed    Latest Ref Rng & Units 08/31/2019   10:18 PM 08/16/2019    1:51 PM 02/24/2017    9:49 AM  CMP  Glucose 70 - 99 mg/dL 829  86  82   BUN 8 - 23 mg/dL 11  12  56.2   Creatinine 0.44 - 1.00 mg/dL 1.30  8.65  0.8   Sodium 135 - 145 mmol/L 138  142  143   Potassium 3.5 - 5.1 mmol/L 3.7  4.2  4.0   Chloride 98 - 111 mmol/L 102  103    CO2 22 - 32 mmol/L 24  26  28    Calcium  8.9 - 10.3 mg/dL 9.9  78.4  69.6   Total Protein 6.0 - 8.5 g/dL  7.2  7.0   Total Bilirubin  0.0 - 1.2 mg/dL  0.5  2.95   Alkaline Phos 39 - 117 IU/L  79  60   AST 0 - 40 IU/L  34  27   ALT 0 - 32 IU/L  28  26     Lab Results  Component Value Date   WBC 5.8 08/31/2019   HGB 15.4 (H) 08/31/2019   HCT 46.3 (H) 08/31/2019   MCV 98.3 08/31/2019   PLT 220 08/31/2019   NEUTROABS 3.0 02/24/2017    ASSESSMENT & PLAN:  Breast cancer, right breast (HCC) Stage IIIa right breast cancer T2 N2 M0 diagnosed in June 2005 in [place] treated with mastectomy followed by adjuvant chemotherapy. 7 on 9 lymph nodes were apparently involved., Did not get adjuvant radiation (Unknown reasons). Arimidex  1 mg daily started January 2006 stopped 02/26/2019   Osteopenia: Patient is on weightbearing exercise program.  Bone density 02/17/2020: T score -1.5: Stable Bone density scheduled for October 2025   Breast cancer surveillance: 1.  Breast exam 03/11/2024:  Benign. 2.  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  Low back pain: going to see Dr.Brooks with orthopedics. Hoping to get an MRI back We discussed role of CT CAP. We decided to hold off at this time Return to clinic in  1 year for follow-up   ------------------------------------- Assessment and Plan Assessment & Plan Malignant neoplasm of right breast No current suspicion of recurrence. Discussed imaging options for cancer detection. Whole body CT scan recommended for comprehensive assessment. - Discuss whole body CT scan for comprehensive assessment of potential cancer recurrence. - Explain whole body CT scan scheduling and result timeline. - Reassure that MRI of lumbar spine can be done first; if negative, further imaging may not be necessary.  Degenerative arthritis of lumbar spine Pain at L4-L5 due to degenerative arthritis. Previous evaluation by orthopedic surgeon Dr. Mort Ards. -  Advise to contact Dr. Mort Ards for evaluation and potential MRI of lumbar spine. - Offer referral for orthopedic consultation if needed.      No orders of the defined types were placed in this encounter.  The patient has a good understanding of the overall plan. she agrees with it. she will call with any problems that may develop before the next visit here. Total time spent: 30 mins including face to face time and time spent for planning, charting and co-ordination of care   Viinay K Devanshi Califf, MD 03/11/24

## 2024-03-13 ENCOUNTER — Encounter: Payer: Self-pay | Admitting: Podiatry

## 2024-03-13 NOTE — Progress Notes (Signed)
 Subjective:  Patient ID: Barbara Rangel, female    DOB: 1946-02-03,  MRN: [id number]  Barbara Rangel presents to clinic today for at risk foot care. Patient has h/o PAD and callus(es) of both feet, porokeratotic lesion(s) right lower extremity, and painful mycotic nails. Painful toenails interfere with ambulation. Aggravating factors include wearing enclosed shoe gear. Pain is relieved with periodic professional debridement. Painful callus(es) and porokeratotic lesion(s) are aggravated when weightbearing with and without shoegear. Pain is relieved with periodic professional debridement.  Chief Complaint  Patient presents with   Nail Problem    Pt is here for [facility] PCP is Dr Varadarjan and LOV was in January.   New problem(s): None.   PCP is Arva Lathe, MD.  Allergies  Allergen Reactions   Moxifloxacin Hcl     Other reaction(s): headache   Nitrous Oxide Other (See Comments)    Pt passed out and had confusion. Took a few hours to wake her up.   Avelox [Moxifloxacin Hcl In Nacl] Other (See Comments)    Severe headache    Cephalexin  Other (See Comments)    Caused headache   Other Other (See Comments)    Anesthesia, needs antiemetic med   Quinolones Other (See Comments)    Headaches?   Tramadol Nausea Only and Other (See Comments)    Nausea and Dizziness, too.   Trimethoprim Other (See Comments)    Rapid heartbeat.   Avelox [Moxifloxacin] Other (See Comments)   Celexa [Citalopram] Other (See Comments)   Prednisone     Cannot take steroids due to her open angle glaucoma   Tizanidine Hcl Other (See Comments)   Citalopram Hydrobromide Other (See Comments)    Pt can't remember what the side effect was.    Review of Systems: Negative except as noted in the HPI.  Objective: No changes noted in today's physical examination. There were no vitals filed for this visit. Barbara Rangel is a pleasant 78 y.o. female WD, WN in NAD. AAO x 3.  Vascular  Examination: CFT <3 seconds b/l. DP/PT pulses faintly palpable b/l. Skin temperature gradient warm to warm b/l. No pain with calf compression. No ischemia or gangrene. No cyanosis or clubbing noted b/l. Pedal hair absent.   Neurological Examination: Sensation grossly intact b/l with 10 gram monofilament. Vibratory sensation intact b/l.   Dermatological Examination: Pedal skin warm and supple b/l.   No open wounds. No interdigital macerations.  Toenails 1-5 b/l thick, discolored, elongated with subungual debris and pain on dorsal palpation.    Hyperkeratotic lesion(s) bilateral great toes.  No erythema, no edema, no drainage, no fluctuance. Porokeratotic lesion(s) submet head 5 right foot. No erythema, no edema, no drainage, no fluctuance.  Musculoskeletal Examination: Muscle strength 5/5 to all lower extremity muscle groups bilaterally. HAV with bunion bilaterally and hammertoes 2-5 b/l.  Radiographs: None  Last A1c:      12/19/2023    9:03 AM  Hemoglobin A1C  Hemoglobin-A1c 5.5         This result is from an external source.   Assessment/Plan: 1. Pain due to onychomycosis of nail   2. Callus   3. Porokeratosis   4. PVD (peripheral vascular disease) (HCC)     Consent given for treatment. Patient examined. All patient's and/or POA's questions/concerns addressed on today's visit. Mycotic toenails 1-5 debrided in length and girth without incident. Calluses pared bilateral great toes. Porokeratotic lesion(s) submet head 5 right foot pared and enucleated with sharp debridement without incident.Continue soft, supportive shoe  gear daily. Report any pedal injuries to medical professional. Call office if there are any quesitons/concerns. -Patient/POA to call should there be question/concern in the interim.   Return in about 3 months (around 06/07/2024).  Barbara Rangel, DPM      [place] LOCATION: [address] [place]  [address]                   Office [phone]   [facility] [place] [facility] LOCATION: [address] [place], [place] [address] Office [phone]

## 2024-03-15 ENCOUNTER — Encounter: Payer: Self-pay | Admitting: Internal Medicine

## 2024-03-15 ENCOUNTER — Telehealth: Payer: Self-pay

## 2024-03-15 DIAGNOSIS — C50011 Malignant neoplasm of nipple and areola, right female breast: Secondary | ICD-10-CM

## 2024-03-15 DIAGNOSIS — G8929 Other chronic pain: Secondary | ICD-10-CM

## 2024-03-15 DIAGNOSIS — E785 Hyperlipidemia, unspecified: Secondary | ICD-10-CM | POA: Diagnosis not present

## 2024-03-15 DIAGNOSIS — Z1331 Encounter for screening for depression: Secondary | ICD-10-CM | POA: Diagnosis not present

## 2024-03-15 DIAGNOSIS — Z0000 Encounter for general adult medical examination without abnormal findings: Secondary | ICD-10-CM | POA: Diagnosis not present

## 2024-03-15 NOTE — Telephone Encounter (Signed)
 Pt called and would like to proceed with metastatic disease eval. Explained to pt that insurance would likely not approve CT CAP and bone scan for back pain. She is agreeable to move forward with MRI LS. Order placed per MD and pt was given number to centralized scheduling to call and schedule. She will f/u with Dr Vaughn Georges at [facility] for results.

## 2024-03-22 ENCOUNTER — Ambulatory Visit (HOSPITAL_COMMUNITY)
Admission: RE | Admit: 2024-03-22 | Discharge: 2024-03-22 | Disposition: A | Discharge status: Home / Self Care | Source: Ambulatory Visit | Attending: Hematology and Oncology | Admitting: Hematology and Oncology

## 2024-03-22 DIAGNOSIS — M4316 Spondylolisthesis, lumbar region: Secondary | ICD-10-CM | POA: Diagnosis not present

## 2024-03-22 DIAGNOSIS — G8929 Other chronic pain: Secondary | ICD-10-CM | POA: Diagnosis not present

## 2024-03-22 DIAGNOSIS — M5126 Other intervertebral disc displacement, lumbar region: Secondary | ICD-10-CM | POA: Diagnosis not present

## 2024-03-22 DIAGNOSIS — M48061 Spinal stenosis, lumbar region without neurogenic claudication: Secondary | ICD-10-CM | POA: Diagnosis not present

## 2024-03-22 DIAGNOSIS — M545 Low back pain, unspecified: Secondary | ICD-10-CM | POA: Diagnosis not present

## 2024-03-22 DIAGNOSIS — M5136 Other intervertebral disc degeneration, lumbar region with discogenic back pain only: Secondary | ICD-10-CM | POA: Diagnosis not present

## 2024-03-22 DIAGNOSIS — C50011 Malignant neoplasm of nipple and areola, right female breast: Secondary | ICD-10-CM | POA: Insufficient documentation

## 2024-03-22 MED ORDER — GADOBUTROL 1 MMOL/ML IV SOLN
7.0000 mL | Freq: Once | INTRAVENOUS | Status: AC | PRN
  Administered 2024-03-22: 7 mL via INTRAVENOUS

## 2024-03-22 MED ORDER — GADOBUTROL 1 MMOL/ML IV SOLN: 6.0000 mL | Freq: Once | INTRAVENOUS | Status: DC | PRN

## 2024-04-13 DIAGNOSIS — F4323 Adjustment disorder with mixed anxiety and depressed mood: Secondary | ICD-10-CM | POA: Diagnosis not present

## 2024-04-20 DIAGNOSIS — H401132 Primary open-angle glaucoma, bilateral, moderate stage: Secondary | ICD-10-CM | POA: Diagnosis not present

## 2024-04-20 DIAGNOSIS — Z961 Presence of intraocular lens: Secondary | ICD-10-CM | POA: Diagnosis not present

## 2024-04-26 DIAGNOSIS — M545 Low back pain, unspecified: Secondary | ICD-10-CM | POA: Diagnosis not present

## 2024-05-10 DIAGNOSIS — F411 Generalized anxiety disorder: Secondary | ICD-10-CM | POA: Diagnosis not present

## 2024-05-10 DIAGNOSIS — M150 Primary generalized (osteo)arthritis: Secondary | ICD-10-CM | POA: Diagnosis not present

## 2024-05-10 DIAGNOSIS — E785 Hyperlipidemia, unspecified: Secondary | ICD-10-CM | POA: Diagnosis not present

## 2024-05-10 DIAGNOSIS — C50911 Malignant neoplasm of unspecified site of right female breast: Secondary | ICD-10-CM | POA: Diagnosis not present

## 2024-05-17 DIAGNOSIS — R413 Other amnesia: Secondary | ICD-10-CM | POA: Diagnosis not present

## 2024-05-19 DIAGNOSIS — M545 Low back pain, unspecified: Secondary | ICD-10-CM | POA: Diagnosis not present

## 2024-05-24 DIAGNOSIS — F4323 Adjustment disorder with mixed anxiety and depressed mood: Secondary | ICD-10-CM | POA: Diagnosis not present

## 2024-06-02 DIAGNOSIS — M545 Low back pain, unspecified: Secondary | ICD-10-CM | POA: Diagnosis not present

## 2024-06-10 DIAGNOSIS — C50911 Malignant neoplasm of unspecified site of right female breast: Secondary | ICD-10-CM | POA: Diagnosis not present

## 2024-06-10 DIAGNOSIS — E785 Hyperlipidemia, unspecified: Secondary | ICD-10-CM | POA: Diagnosis not present

## 2024-06-10 DIAGNOSIS — M150 Primary generalized (osteo)arthritis: Secondary | ICD-10-CM | POA: Diagnosis not present

## 2024-06-10 DIAGNOSIS — F411 Generalized anxiety disorder: Secondary | ICD-10-CM | POA: Diagnosis not present

## 2024-06-16 DIAGNOSIS — M545 Low back pain, unspecified: Secondary | ICD-10-CM | POA: Diagnosis not present

## 2024-06-22 ENCOUNTER — Encounter: Payer: Self-pay | Admitting: Podiatry

## 2024-06-22 ENCOUNTER — Ambulatory Visit: Admitting: Podiatry

## 2024-06-22 DIAGNOSIS — Q828 Other specified congenital malformations of skin: Secondary | ICD-10-CM

## 2024-06-22 DIAGNOSIS — I739 Peripheral vascular disease, unspecified: Secondary | ICD-10-CM

## 2024-06-25 DIAGNOSIS — M1611 Unilateral primary osteoarthritis, right hip: Secondary | ICD-10-CM | POA: Diagnosis not present

## 2024-06-25 DIAGNOSIS — M25551 Pain in right hip: Secondary | ICD-10-CM | POA: Diagnosis not present

## 2024-06-27 ENCOUNTER — Encounter: Payer: Self-pay | Admitting: Podiatry

## 2024-06-27 NOTE — Progress Notes (Signed)
 Subjective:  Patient ID: Barbara Rangel, female    DOB: May 04, 1946,  MRN: [id number]  Barbara Rangel presents to clinic today for at risk foot care. Patient has h/o PAD and painful porokeratotic lesions right foot. Pain prevent(s) comfortable ambulation. Aggravating factor is weightbearing with and without shoegear.  Chief Complaint  Patient presents with   RFC    Rm15 Routine foot Care Dr. Elliot last visit April 2025   New problem(s): None.   PCP is Elliot Charm, MD.  Allergies  Allergen Reactions   Moxifloxacin Hcl     Other reaction(s): headache   Nitrous Oxide Other (See Comments)    Pt passed out and had confusion. Took a few hours to wake her up.   Avelox [Moxifloxacin Hcl In Nacl] Other (See Comments)    Severe headache    Cephalexin  Other (See Comments)    Caused headache   Other Other (See Comments)    Anesthesia, needs antiemetic med   Quinolones Other (See Comments)    Headaches?   Tramadol Nausea Only and Other (See Comments)    Nausea and Dizziness, too.   Trimethoprim Other (See Comments)    Rapid heartbeat.   Avelox [Moxifloxacin] Other (See Comments)   Celexa [Citalopram] Other (See Comments)   Prednisone     Cannot take steroids due to her open angle glaucoma   Tizanidine Hcl Other (See Comments)   Citalopram Hydrobromide Other (See Comments)    Pt can't remember what the side effect was.    Review of Systems: Negative except as noted in the HPI.  Objective: No changes noted in today's physical examination. There were no vitals filed for this visit. SHAUNIKA ITALIANO is a pleasant 78 y.o. female WD, WN in NAD. AAO x 3.  Vascular Examination: CFT <3 seconds b/l. DP/PT pulses faintly palpable b/l. Pedal edema absent. Skin temperature gradient warm to warm b/l. Digital hair absent. No pain with calf compression. No ischemia or gangrene. No cyanosis or clubbing noted b/l. No edema noted b/l LE.   Neurological  Examination: Sensation grossly intact b/l with 10 gram monofilament. Vibratory sensation intact b/l.   Dermatological Examination: Pedal skin warm and supple b/l.   No open wounds. No interdigital macerations.  Toenails 1-5 b/l well maintained with adequate length. No erythema, no edema, no drainage, no fluctuance. Porokeratotic lesion(s) submet head 5 right foot. No erythema, no edema, no drainage, no fluctuance.  Musculoskeletal Examination: Muscle strength 5/5 to all lower extremity muscle groups bilaterally. HAV with bunion deformity noted b/l LE. Hammertoe(s) 2-5 b/l.  Radiographs: None  Last A1c:      12/19/2023    9:03 AM  Hemoglobin A1C  Hemoglobin-A1c 5.5         This result is from an external source.    Assessment/Plan: 1. Porokeratosis   2. PVD (peripheral vascular disease) (HCC)   -Patient was evaluated today. All questions/concerns addressed on today's visit. -Patient to continue soft, supportive shoe gear daily. -Porokeratotic lesion(s) submet head 5 right foot pared and enucleated with sterile currette without incident. Total number of lesions debrided=1. -Patient/POA to call should there be question/concern in the interim.   Return in about 3 months (around 09/22/2024).  Delon LITTIE Merlin, DPM      [place] LOCATION: [address]  [place], [place] [address]                   Office [phone]   [facility] LOCATION: [address] [place], [place] [address] Office [phone]

## 2024-06-30 DIAGNOSIS — F4323 Adjustment disorder with mixed anxiety and depressed mood: Secondary | ICD-10-CM | POA: Diagnosis not present

## 2024-07-06 DIAGNOSIS — Z01810 Encounter for preprocedural cardiovascular examination: Secondary | ICD-10-CM | POA: Diagnosis not present

## 2024-07-11 DIAGNOSIS — F411 Generalized anxiety disorder: Secondary | ICD-10-CM | POA: Diagnosis not present

## 2024-07-11 DIAGNOSIS — C50911 Malignant neoplasm of unspecified site of right female breast: Secondary | ICD-10-CM | POA: Diagnosis not present

## 2024-07-11 DIAGNOSIS — M150 Primary generalized (osteo)arthritis: Secondary | ICD-10-CM | POA: Diagnosis not present

## 2024-07-11 DIAGNOSIS — E785 Hyperlipidemia, unspecified: Secondary | ICD-10-CM | POA: Diagnosis not present

## 2024-07-13 DIAGNOSIS — H26493 Other secondary cataract, bilateral: Secondary | ICD-10-CM | POA: Diagnosis not present

## 2024-07-13 DIAGNOSIS — Z961 Presence of intraocular lens: Secondary | ICD-10-CM | POA: Diagnosis not present

## 2024-07-13 DIAGNOSIS — H401132 Primary open-angle glaucoma, bilateral, moderate stage: Secondary | ICD-10-CM | POA: Diagnosis not present

## 2024-07-13 DIAGNOSIS — H5213 Myopia, bilateral: Secondary | ICD-10-CM | POA: Diagnosis not present

## 2024-07-21 DIAGNOSIS — M1611 Unilateral primary osteoarthritis, right hip: Secondary | ICD-10-CM | POA: Diagnosis not present

## 2024-07-26 DIAGNOSIS — L821 Other seborrheic keratosis: Secondary | ICD-10-CM | POA: Diagnosis not present

## 2024-07-26 DIAGNOSIS — L578 Other skin changes due to chronic exposure to nonionizing radiation: Secondary | ICD-10-CM | POA: Diagnosis not present

## 2024-07-26 DIAGNOSIS — D2271 Melanocytic nevi of right lower limb, including hip: Secondary | ICD-10-CM | POA: Diagnosis not present

## 2024-07-26 DIAGNOSIS — L280 Lichen simplex chronicus: Secondary | ICD-10-CM | POA: Diagnosis not present

## 2024-07-26 DIAGNOSIS — D239 Other benign neoplasm of skin, unspecified: Secondary | ICD-10-CM | POA: Diagnosis not present

## 2024-07-26 DIAGNOSIS — D2272 Melanocytic nevi of left lower limb, including hip: Secondary | ICD-10-CM | POA: Diagnosis not present

## 2024-07-26 DIAGNOSIS — L814 Other melanin hyperpigmentation: Secondary | ICD-10-CM | POA: Diagnosis not present

## 2024-07-26 DIAGNOSIS — D225 Melanocytic nevi of trunk: Secondary | ICD-10-CM | POA: Diagnosis not present

## 2024-08-10 ENCOUNTER — Ambulatory Visit

## 2024-08-10 ENCOUNTER — Encounter (HOSPITAL_COMMUNITY)

## 2024-08-10 DIAGNOSIS — C50911 Malignant neoplasm of unspecified site of right female breast: Secondary | ICD-10-CM | POA: Diagnosis not present

## 2024-08-10 DIAGNOSIS — F411 Generalized anxiety disorder: Secondary | ICD-10-CM | POA: Diagnosis not present

## 2024-08-10 DIAGNOSIS — E785 Hyperlipidemia, unspecified: Secondary | ICD-10-CM | POA: Diagnosis not present

## 2024-08-10 DIAGNOSIS — M150 Primary generalized (osteo)arthritis: Secondary | ICD-10-CM | POA: Diagnosis not present

## 2024-08-11 DIAGNOSIS — F4323 Adjustment disorder with mixed anxiety and depressed mood: Secondary | ICD-10-CM | POA: Diagnosis not present

## 2024-08-11 DIAGNOSIS — H26492 Other secondary cataract, left eye: Secondary | ICD-10-CM | POA: Diagnosis not present

## 2024-08-11 DIAGNOSIS — Z961 Presence of intraocular lens: Secondary | ICD-10-CM | POA: Diagnosis not present

## 2024-08-14 DIAGNOSIS — Z23 Encounter for immunization: Secondary | ICD-10-CM | POA: Diagnosis not present

## 2024-08-17 ENCOUNTER — Ambulatory Visit (HOSPITAL_COMMUNITY)
Admission: RE | Admit: 2024-08-17 | Discharge: 2024-08-17 | Disposition: A | Discharge status: Home / Self Care | Source: Ambulatory Visit | Attending: Physician Assistant | Admitting: Physician Assistant

## 2024-08-17 ENCOUNTER — Ambulatory Visit (INDEPENDENT_AMBULATORY_CARE_PROVIDER_SITE_OTHER): Admitting: Physician Assistant

## 2024-08-17 VITALS — BP 149/82 | HR 71 | Temp 98.4°F | Resp 18 | Ht 64.0 in | Wt 124.5 lb

## 2024-08-17 DIAGNOSIS — I6523 Occlusion and stenosis of bilateral carotid arteries: Secondary | ICD-10-CM | POA: Insufficient documentation

## 2024-08-17 NOTE — Progress Notes (Signed)
 Office Note     CC:  follow up Requesting Provider:  Elliot Charm,*  HPI: Barbara Rangel is a 78 y.o. (July 10, 1946) female who presents for surveillance of carotid artery stenosis.  Since last office visit 6 months ago she denies any diagnosis of CVA or TIA.  She also denies any neurological events including slurring speech, changes in vision, or one-sided weakness.  She is taking an aspirin and a statin daily.  She is scheduled for hip replacement surgery at the end of the month and will need to hold her aspirin perioperatively.  She denies tobacco use.   Past Medical History:  Diagnosis Date   Arthritis    degenerative arthritis   Breast cancer, right breast (HCC) 04/2004   T2N1 diagnosed June 2005, ER PR + and Her 2 negative, post mastectomy with axillary node dissection (possibly 9 or 11 nodes removed, with possibly 7 or 9 involved) then treatment on NSABP B28 with dose dense adriamycin cytoxan followed by taxol, all of this Rx  in [place]. Adjuvant arimidex  begun Jan 2006. No radiation.    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   Other and unspecified general anesthetics causing adverse effect in therapeutic use    hard to wake up   PONV (postoperative nausea and vomiting)    Syncope 09/15/2012    Past Surgical History:  Procedure Laterality Date   ANKLE SURGERY  2011   left ankle with screws and plates   BREAST LUMPECTOMY     x2   cataract surgery  2005   right eye   COLONOSCOPY     COLONOSCOPY WITH PROPOFOL  N/A 10/11/2014   Procedure: COLONOSCOPY WITH PROPOFOL ;  Surgeon: Gladis MARLA Louder, MD;  Location: [facility];  Service: Endoscopy;  Laterality: N/A;   EYE  SURGERY  2004   right eye-detached retina   left breast biopsy  2009   LUMBAR LAMINECTOMY/DECOMPRESSION MICRODISCECTOMY  10/31/2011   Procedure: LUMBAR LAMINECTOMY/DECOMPRESSION MICRODISCECTOMY;  Surgeon: Donaciano JONETTA Sprang;  Location: [facility] OR;  Service: Orthopedics;  Laterality: Right;  L4-5 Decompression with Right Facet Decompression    MASTECTOMY  2005   right  d/t breast cancer;no sticks to right arm LYMPH NODES REMOVED.   RETINAL DETACHMENT SURGERY  2004   right breast biopsy  2005   TUBAL LIGATION  1980    Social History   Socioeconomic History   Marital status: Married    Spouse name: Manus   Number of children: 4   Years of education: BS   Highest education level: Not on file  Occupational History   Occupation: Retired  Tobacco Use   Smoking status: Never    Passive exposure: Never   Smokeless tobacco: Never  Vaping Use   Vaping status: Never Used  Substance and Sexual Activity   Alcohol use: No   Drug use: No   Sexual activity: Not Currently  Other Topics Concern   Not on file  Social History Narrative   Pt lives at home with spouse.   Caffeine Use: none   Social Drivers of Corporate investment banker Strain: Not on file  Food Insecurity: No Food Insecurity (  12/19/2023)   Hunger Vital Sign    Worried About Running Out of Food in the Last Year: Never true    Ran Out of Food in the Last Year: Never true  Transportation Needs: No Transportation Needs (12/19/2023)   PRAPARE - Administrator, Civil Service (Medical): No    Lack of Transportation (Non-Medical): No  Physical Activity: Not on file  Stress: Not on file  Social Connections: Not on file  Intimate Partner Violence: Not At Risk (12/19/2023)   Humiliation, Afraid, Rape, and Kick questionnaire    Fear of Current or Ex-Partner: No    Emotionally Abused: No    Physically Abused: No    Sexually Abused: No    Family History  Problem Relation Age of Onset   Colon cancer Mother    Breast cancer  Cousin        on dads side   Breast cancer Cousin        on moms side   Anesthesia problems Neg Hx    Hypotension Neg Hx    Malignant hyperthermia Neg Hx    Pseudochol deficiency Neg Hx     Current Outpatient Medications  Medication Sig Dispense Refill   acetaminophen  (TYLENOL ) 500 MG tablet Take 1,000 mg by mouth at bedtime.     amLODipine  (NORVASC ) 5 MG tablet Take 5 mg by mouth at bedtime.      AREXVY 120 MCG/0.5ML injection      aspirin EC 81 MG tablet Take 81 mg by mouth every morning.      atorvastatin (LIPITOR) 20 MG tablet Take 1 tablet by mouth every morning.     calcium  citrate-vitamin D  (CITRACAL+D) 315-200 MG-UNIT per tablet Take 2 tablets by mouth 2 (two) times daily.     carbamide peroxide (DEBROX) 6.5 % OTIC solution Place 5 drops into both ears 2 (two) times daily. 15 mL 0   Cranberry Extract 250 MG TABS Take 1 capsule by mouth at bedtime.      fexofenadine  (ALLEGRA  ODT) 30 MG disintegrating tablet Take 30 mg by mouth daily.     guaiFENesin  (MUCINEX ) 600 MG 12 hr tablet Take 1 tablet (600 mg total) by mouth 2 (two) times daily as needed.     Multiple Vitamins-Minerals (MULTIVITAMINS THER. W/MINERALS) TABS Take 1 tablet by mouth every morning.     omeprazole  (PRILOSEC) 20 MG capsule Take 1 capsule (20 mg total) by mouth daily.     sertraline (ZOLOFT) 25 MG tablet Take 25 mg by mouth daily.     timolol (BETIMOL) 0.25 % ophthalmic solution 1-2 drops 2 (two) times daily.     No current facility-administered medications for this visit.    Allergies  Allergen Reactions   Moxifloxacin Hcl     Other reaction(s): headache   Nitrous Oxide Other (See Comments)    Pt passed out and had confusion. Took a few hours to wake her up.   Avelox [Moxifloxacin Hcl In Nacl] Other (See Comments)    Severe headache    Cephalexin  Other (See Comments)    Caused headache   Other Other (See Comments)    Anesthesia, needs antiemetic med   Quinolones Other (See Comments)    Headaches?    Tramadol Nausea Only and Other (See Comments)    Nausea and Dizziness, too.   Trimethoprim Other (See Comments)    Rapid heartbeat.   Avelox [Moxifloxacin] Other (See Comments)   Celexa [Citalopram] Other (See Comments)   Prednisone  Cannot take steroids due to her open angle glaucoma   Tizanidine Hcl Other (See Comments)   Citalopram Hydrobromide Other (See Comments)    Pt can't remember what the side effect was.     REVIEW OF SYSTEMS:  Negative unless noted in HPI [X]  denotes positive finding, [ ]  denotes negative finding Cardiac  Comments:  Chest pain or chest pressure:    Shortness of breath upon exertion:    Short of breath when lying flat:    Irregular heart rhythm:        Vascular    Pain in calf, thigh, or hip brought on by ambulation:    Pain in feet at night that wakes you up from your sleep:     Blood clot in your veins:    Leg swelling:         Pulmonary    Oxygen at home:    Productive cough:     Wheezing:         Neurologic    Sudden weakness in arms or legs:     Sudden numbness in arms or legs:     Sudden onset of difficulty speaking or slurred speech:    Temporary loss of vision in one eye:     Problems with dizziness:         Gastrointestinal    Blood in stool:     Vomited blood:         Genitourinary    Burning when urinating:     Blood in urine:        Psychiatric    Major depression:         Hematologic    Bleeding problems:    Problems with blood clotting too easily:        Skin    Rashes or ulcers:        Constitutional    Fever or chills:      PHYSICAL EXAMINATION:  Vitals:   08/17/24 0855  BP: (!) 149/82  Pulse: 71  Resp: 18  Temp: 98.4 F (36.9 C)  TempSrc: Temporal  Weight: 124 lb 8 oz (56.5 kg)  Height: 5' 4 (1.626 m)    General:  WDWN in NAD; vital signs documented above Gait: Not observed HENT: WNL, normocephalic Pulmonary: normal non-labored breathing Cardiac: regular HR Abdomen: soft, NT, no  masses Skin: without rashes Vascular Exam/Pulses: symmetrical radial pulses Extremities: without ischemic changes, without Gangrene , without cellulitis; without open wounds;  Musculoskeletal: no muscle wasting or atrophy  Neurologic: A&O X 3; cranial nerves grossly intact Psychiatric:  The pt has Normal affect.   Non-Invasive Vascular Imaging:   Right ICA 40 to 59% Left ICA 40 to 59%    ASSESSMENT/PLAN:: 77 y.o. female here for follow up for surveillance of carotid artery stenosis  Subjectively, no neurological events since last office visit.  Carotid duplex demonstrates 40 to 59% of the right ICA.  Left ICA stable likely in the upper range of 40 to 59%.  No indication for intervention on asymptomatic stenosis less than 80% at this time.  We will repeat carotid duplex in 1 year.  Okay to hold aspirin perioperatively for hip surgery at the end of the month (1 week prior).  Patient knows to notify the office with any questions or concerns.   Donnice Sender, PA-C [facility] [phone]  Clinic MD:   [place]

## 2024-08-24 DIAGNOSIS — R194 Change in bowel habit: Secondary | ICD-10-CM | POA: Diagnosis not present

## 2024-08-24 DIAGNOSIS — K5900 Constipation, unspecified: Secondary | ICD-10-CM | POA: Diagnosis not present

## 2024-08-24 DIAGNOSIS — Z800 Family history of malignant neoplasm of digestive organs: Secondary | ICD-10-CM | POA: Diagnosis not present

## 2024-08-24 DIAGNOSIS — K219 Gastro-esophageal reflux disease without esophagitis: Secondary | ICD-10-CM | POA: Diagnosis not present

## 2024-08-24 DIAGNOSIS — R0989 Other specified symptoms and signs involving the circulatory and respiratory systems: Secondary | ICD-10-CM | POA: Diagnosis not present

## 2024-08-24 DIAGNOSIS — Z86018 Personal history of other benign neoplasm: Secondary | ICD-10-CM | POA: Diagnosis not present

## 2024-08-24 NOTE — Progress Notes (Signed)
 Sent message, via epic in basket, requesting orders in epic from Careers adviser.

## 2024-08-30 DIAGNOSIS — R194 Change in bowel habit: Secondary | ICD-10-CM | POA: Diagnosis not present

## 2024-08-30 DIAGNOSIS — D120 Benign neoplasm of cecum: Secondary | ICD-10-CM | POA: Diagnosis not present

## 2024-08-30 DIAGNOSIS — Z800 Family history of malignant neoplasm of digestive organs: Secondary | ICD-10-CM | POA: Diagnosis not present

## 2024-08-30 NOTE — Progress Notes (Signed)
 COVID Vaccine Completed:  Date of COVID positive in last 90 days:  PCP - Valery Ripple, MD  Cardiologist - Soyla Norton, MD (last OV 2017)  Chest x-ray - N/A EKG - N/A Stress Test - 03-13-16  Epic ECHO - N/A Cardiac Cath -  Pacemaker/ICD device last checked:N/A Spinal Cord Stimulator:N/A  Bowel Prep - N/A  Sleep Study - N/A CPAP -   Fasting Blood Sugar - N/A Checks Blood Sugar _____ times a day  Last dose of GLP1 agonist-  N/A GLP1 instructions:  Do not take after     Last dose of SGLT-2 inhibitors-  N/A SGLT-2 instructions:  Do not take after     Blood Thinner Instructions: N/A Last dose:   Time: Aspirin Instructions:N/A Last Dose:  Activity level:  Can go up a flight of stairs and perform activities of daily living without stopping and without symptoms of chest pain or shortness of breath.  Able to exercise without symptoms  Unable to go up a flight of stairs without symptoms of     Anesthesia review: Bilateral carotid artery stenosis  Patient denies shortness of breath, fever, cough and chest pain at PAT appointment  Patient verbalized understanding of instructions that were given to them at the PAT appointment. Patient was also instructed that they will need to review over the PAT instructions again at home before surgery.

## 2024-08-30 NOTE — Patient Instructions (Addendum)
 SURGICAL WAITING ROOM VISITATION Patients having surgery or a procedure may have no more than 2 support people in the waiting area - these visitors may rotate.    Children under the age of 2 must have an adult with them who is not the patient.  If the patient needs to stay at the hospital during part of their recovery, the visitor guidelines for inpatient rooms apply. Pre-op nurse will coordinate an appropriate time for 1 support person to accompany patient in pre-op.  This support person may not rotate.    Please refer to the [facility] website for the visitor guidelines for Inpatients (after your surgery is over and you are in a regular room).       Your procedure is scheduled on: 09-09-24   Report to [facility] Main Entrance    Report to admitting at 8:30 AM   Call this number if you have problems the morning of surgery [phone]   Do not eat food :After Midnight.   After Midnight you may have the following liquids until 8:00 AM DAY OF SURGERY  Water Non-Citrus Juices (without pulp, NO RED-Apple, White grape, White cranberry) Black Coffee (NO MILK/CREAM OR CREAMERS, sugar ok)  Clear Tea (NO MILK/CREAM OR CREAMERS, sugar ok) regular and decaf                             Plain Jell-O (NO RED)                                           Fruit ices (not with fruit pulp, NO RED)                                     Popsicles (NO RED)                                                               Sports drinks like Gatorade (NO RED)                   The day of surgery:  Drink ONE (1) Pre-Surgery Clear Ensure by 8:00 AM the morning of surgery. Drink in one sitting. Do not sip.  This drink was given to you during your hospital  pre-op appointment visit. Nothing else to drink after completing the Pre-Surgery Clear Ensure .          If you have questions, please contact your surgeon's office.   FOLLOW  ANY ADDITIONAL PRE OP INSTRUCTIONS YOU RECEIVED FROM YOUR  SURGEON'S OFFICE!!!     Oral Hygiene is also important to reduce your risk of infection.                                    Remember - BRUSH YOUR TEETH THE MORNING OF SURGERY WITH YOUR REGULAR TOOTHPASTE   Do NOT smoke after Midnight   Take these medicines the morning of surgery with A SIP OF WATER:    Atorvastatin  Allegra    Omeprazole    Sertraline   Okay to use eyedrops  Stop all vitamins and herbal supplements 7 days before surgery  Bring CPAP mask and tubing day of surgery.                              You may not have any metal on your body including hair pins, jewelry, and body piercing             Do not wear make-up, lotions, powders, perfumes or deodorant  Do not wear nail polish including gel and S&S, artificial/acrylic nails, or any other type of covering on natural nails including finger and toenails. If you have artificial nails, gel coating, etc. that needs to be removed by a nail salon please have this removed prior to surgery or surgery may need to be canceled/ delayed if the surgeon/ anesthesia feels like they are unable to be safely monitored.   Do not shave  48 hours prior to surgery.           Do not bring valuables to the hospital. [name] IS NOT RESPONSIBLE   FOR VALUABLES.   Contacts, dentures or bridgework may not be worn into surgery.   Bring small overnight bag day of surgery.   DO NOT BRING YOUR HOME MEDICATIONS TO THE HOSPITAL. PHARMACY WILL DISPENSE MEDICATIONS LISTED ON YOUR MEDICATION LIST TO YOU DURING YOUR ADMISSION IN THE HOSPITAL!   Special Instructions: Bring a copy of your healthcare power of attorney and living will documents the day of surgery if you haven't scanned them before.              Please read over the following fact sheets you were given: IF YOU HAVE QUESTIONS ABOUT YOUR PRE-OP INSTRUCTIONS PLEASE CALL [phone] Barbara Rangel  If you received a COVID test during your pre-op visit  it is requested that you wear a mask when out in  public, stay away from anyone that may not be feeling well and notify your surgeon if you develop symptoms. If you test positive for Covid or have been in contact with anyone that has tested positive in the last 10 days please notify you surgeon.     Pre-operative 4 CHG Bath Instructions  DYNA-Hex 4 Chlorhexidine Gluconate 4% Solution Antiseptic 4 fl. oz   You can play a key role in reducing the risk of infection after surgery. Your skin needs to be as free of germs as possible. You can reduce the number of germs on your skin by washing with CHG (chlorhexidine gluconate) soap before surgery. CHG is an antiseptic soap that kills germs and continues to kill germs even after washing.   DO NOT use if you have an allergy to chlorhexidine/CHG or antibacterial soaps. If your skin becomes reddened or irritated, stop using the CHG and notify one of our RNs at   Please shower with the CHG soap starting 4 days before surgery using the following schedule:     Please keep in mind the following:  DO NOT shave, including legs and underarms, starting the day of your first shower.   You may shave your face at any point before/day of surgery.  Place clean sheets on your bed the day you start using CHG soap. Use a clean washcloth (not used since being washed) for each shower. DO NOT sleep with pets once you start using the CHG.  CHG Shower Instructions:  If you  choose to wash your hair and private area, wash first with your normal shampoo/soap.  After you use shampoo/soap, rinse your hair and body thoroughly to remove shampoo/soap residue.  Turn the water OFF and apply about 3 tablespoons (45 ml) of CHG soap to a CLEAN washcloth.  Apply CHG soap ONLY FROM YOUR NECK DOWN TO YOUR TOES (washing for 3-5 minutes)  DO NOT use CHG soap on face, private areas, open wounds, or sores.  Pay special attention to the area where your surgery is being performed.  If you are having back surgery, having someone wash your  back for you may be helpful. Wait 2 minutes after CHG soap is applied, then you may rinse off the CHG soap.  Pat dry with a clean towel  Put on clean clothes/pajamas   If you choose to wear lotion, please use ONLY the CHG-compatible lotions on the back of this paper.     Additional instructions for the day of surgery: DO NOT APPLY any lotions, deodorants, cologne, or perfumes.   Put on clean/comfortable clothes.  Brush your teeth.  Ask your nurse before applying any prescription medications to the skin.   CHG Compatible Lotions   Aveeno Moisturizing lotion  Cetaphil Moisturizing Cream  Cetaphil Moisturizing Lotion  Clairol Herbal Essence Moisturizing Lotion, Dry Skin  Clairol Herbal Essence Moisturizing Lotion, Extra Dry Skin  Clairol Herbal Essence Moisturizing Lotion, Normal Skin  Curel Age Defying Therapeutic Moisturizing Lotion with Alpha Hydroxy  Curel Extreme Care Body Lotion  Curel Soothing Hands Moisturizing Hand Lotion  Curel Therapeutic Moisturizing Cream, Fragrance-Free  Curel Therapeutic Moisturizing Lotion, Fragrance-Free  Curel Therapeutic Moisturizing Lotion, Original Formula  Eucerin Daily Replenishing Lotion  Eucerin Dry Skin Therapy Plus Alpha Hydroxy Crme  Eucerin Dry Skin Therapy Plus Alpha Hydroxy Lotion  Eucerin Original Crme  Eucerin Original Lotion  Eucerin Plus Crme Eucerin Plus Lotion  Eucerin TriLipid Replenishing Lotion  Keri Anti-Bacterial Hand Lotion  Keri Deep Conditioning Original Lotion Dry Skin Formula Softly Scented  Keri Deep Conditioning Original Lotion, Fragrance Free Sensitive Skin Formula  Keri Lotion Fast Absorbing Fragrance Free Sensitive Skin Formula  Keri Lotion Fast Absorbing Softly Scented Dry Skin Formula  Keri Original Lotion  Keri Skin Renewal Lotion Keri Silky Smooth Lotion  Keri Silky Smooth Sensitive Skin Lotion  Nivea Body Creamy Conditioning Oil  Nivea Body Extra Enriched Lotion  Nivea Body Original Lotion  Nivea  Body Sheer Moisturizing Lotion Nivea Crme  Nivea Skin Firming Lotion  NutraDerm 30 Skin Lotion  NutraDerm Skin Lotion  NutraDerm Therapeutic Skin Cream  NutraDerm Therapeutic Skin Lotion  ProShield Protective Hand Cream  Provon moisturizing lotion   PATIENT SIGNATURE_________________________________  NURSE SIGNATURE__________________________________  ________________________________________________________________________    Barbara Rangel  An incentive spirometer is a tool that can help keep your lungs clear and active. This tool measures how well you are filling your lungs with each breath. Taking long deep breaths may help reverse or decrease the chance of developing breathing (pulmonary) problems (especially infection) following: A long period of time when you are unable to move or be active. BEFORE THE PROCEDURE  If the spirometer includes an indicator to show your best effort, your nurse or respiratory therapist will set it to a desired goal. If possible, sit up straight or lean slightly forward. Try not to slouch. Hold the incentive spirometer in an upright position. INSTRUCTIONS FOR USE  Sit on the edge of your bed if possible, or sit up as far as you can in bed  or on a chair. Hold the incentive spirometer in an upright position. Breathe out normally. Place the mouthpiece in your mouth and seal your lips tightly around it. Breathe in slowly and as deeply as possible, raising the piston or the ball toward the top of the column. Hold your breath for 3-5 seconds or for as long as possible. Allow the piston or ball to fall to the bottom of the column. Remove the mouthpiece from your mouth and breathe out normally. Rest for a few seconds and repeat Steps 1 through 7 at least 10 times every 1-2 hours when you are awake. Take your time and take a few normal breaths between deep breaths. The spirometer may include an indicator to show your best effort. Use the indicator as a  goal to work toward during each repetition. After each set of 10 deep breaths, practice coughing to be sure your lungs are clear. If you have an incision (the cut made at the time of surgery), support your incision when coughing by placing a pillow or rolled up towels firmly against it. Once you are able to get out of bed, walk around indoors and cough well. You may stop using the incentive spirometer when instructed by your caregiver.  RISKS AND COMPLICATIONS Take your time so you do not get dizzy or light-headed. If you are in pain, you may need to take or ask for pain medication before doing incentive spirometry. It is harder to take a deep breath if you are having pain. AFTER USE Rest and breathe slowly and easily. It can be helpful to keep track of a log of your progress. Your caregiver can provide you with a simple table to help with this. If you are using the spirometer at home, follow these instructions: SEEK MEDICAL CARE IF:  You are having difficultly using the spirometer. You have trouble using the spirometer as often as instructed. Your pain medication is not giving enough relief while using the spirometer. You develop fever of 100.5 F (38.1 C) or higher. SEEK IMMEDIATE MEDICAL CARE IF:  You cough up bloody sputum that had not been present before. You develop fever of 102 F (38.9 C) or greater. You develop worsening pain at or near the incision site. MAKE SURE YOU:  Understand these instructions. Will watch your condition. Will get help right away if you are not doing well or get worse. Document Released: 03/10/2007 Document Revised: 01/20/2012 Document Reviewed: 05/11/2007 Exit[employer] Patient Information 2014 [employer], [place].   ________________________________________________________________________ WHAT IS A BLOOD TRANSFUSION? Blood Transfusion Information  A transfusion is the replacement of blood or some of its parts. Blood is made up of multiple cells which provide  different functions. Red blood cells carry oxygen and are used for blood loss replacement. White blood cells fight against infection. Platelets control bleeding. Plasma helps clot blood. Other blood products are available for specialized needs, such as hemophilia or other clotting disorders. BEFORE THE TRANSFUSION  Who gives blood for transfusions?  Healthy volunteers who are fully evaluated to make sure their blood is safe. This is blood bank blood. Transfusion therapy is the safest it has ever been in the practice of medicine. Before blood is taken from a donor, a complete history is taken to make sure that person has no history of diseases nor engages in risky social behavior (examples are intravenous drug use or sexual activity with multiple partners). The donor's travel history is screened to minimize risk of transmitting infections, such as malaria. The donated  blood is tested for signs of infectious diseases, such as HIV and hepatitis. The blood is then tested to be sure it is compatible with you in order to minimize the chance of a transfusion reaction. If you or a relative donates blood, this is often done in anticipation of surgery and is not appropriate for emergency situations. It takes many days to process the donated blood. RISKS AND COMPLICATIONS Although transfusion therapy is very safe and saves many lives, the main dangers of transfusion include:  Getting an infectious disease. Developing a transfusion reaction. This is an allergic reaction to something in the blood you were given. Every precaution is taken to prevent this. The decision to have a blood transfusion has been considered carefully by your caregiver before blood is given. Blood is not given unless the benefits outweigh the risks. AFTER THE TRANSFUSION Right after receiving a blood transfusion, you will usually feel much better and more energetic. This is especially true if your red blood cells have gotten low (anemic).  The transfusion raises the level of the red blood cells which carry oxygen, and this usually causes an energy increase. The nurse administering the transfusion will monitor you carefully for complications. HOME CARE INSTRUCTIONS  No special instructions are needed after a transfusion. You may find your energy is better. Speak with your caregiver about any limitations on activity for underlying diseases you may have. SEEK MEDICAL CARE IF:  Your condition is not improving after your transfusion. You develop redness or irritation at the intravenous (IV) site. SEEK IMMEDIATE MEDICAL CARE IF:  Any of the following symptoms occur over the next 12 hours: Shaking chills. You have a temperature by mouth above 102 F (38.9 C), not controlled by medicine. Chest, back, or muscle pain. People around you feel you are not acting correctly or are confused. Shortness of breath or difficulty breathing. Dizziness and fainting. You get a rash or develop hives. You have a decrease in urine output. Your urine turns a dark color or changes to pink, red, or brown. Any of the following symptoms occur over the next 10 days: You have a temperature by mouth above 102 F (38.9 C), not controlled by medicine. Shortness of breath. Weakness after normal activity. The white part of the eye turns yellow (jaundice). You have a decrease in the amount of urine or are urinating less often. Your urine turns a dark color or changes to pink, red, or brown. Document Released: 10/25/2000 Document Revised: 01/20/2012 Document Reviewed: 06/13/2008 [facility] Patient Information 2014 [place], [place].  _______________________________________________________________________

## 2024-08-31 ENCOUNTER — Encounter (HOSPITAL_COMMUNITY)
Admission: RE | Admit: 2024-08-31 | Discharge: 2024-08-31 | Disposition: A | Discharge status: Home / Self Care | Source: Ambulatory Visit | Attending: Internal Medicine | Admitting: Internal Medicine

## 2024-09-01 ENCOUNTER — Encounter (HOSPITAL_COMMUNITY)
Admission: RE | Admit: 2024-09-01 | Discharge: 2024-09-01 | Disposition: A | Discharge status: Home / Self Care | Source: Ambulatory Visit | Attending: Orthopedic Surgery | Admitting: Orthopedic Surgery

## 2024-09-01 ENCOUNTER — Other Ambulatory Visit (HOSPITAL_BASED_OUTPATIENT_CLINIC_OR_DEPARTMENT_OTHER): Payer: Self-pay | Admitting: Internal Medicine

## 2024-09-01 ENCOUNTER — Encounter (HOSPITAL_BASED_OUTPATIENT_CLINIC_OR_DEPARTMENT_OTHER): Payer: Self-pay

## 2024-09-01 ENCOUNTER — Other Ambulatory Visit (HOSPITAL_BASED_OUTPATIENT_CLINIC_OR_DEPARTMENT_OTHER)

## 2024-09-01 DIAGNOSIS — Z01818 Encounter for other preprocedural examination: Secondary | ICD-10-CM

## 2024-09-01 DIAGNOSIS — D120 Benign neoplasm of cecum: Secondary | ICD-10-CM | POA: Diagnosis not present

## 2024-09-01 DIAGNOSIS — I10 Essential (primary) hypertension: Secondary | ICD-10-CM

## 2024-09-01 DIAGNOSIS — M85859 Other specified disorders of bone density and structure, unspecified thigh: Secondary | ICD-10-CM

## 2024-09-01 NOTE — Progress Notes (Signed)
 Patient canceled preop appointment today 09-01-24 because she is awaiting pathology results from a colonoscopy.  Patient states that she has notified Dr. Lyndle office about what is going on.

## 2024-09-07 ENCOUNTER — Other Ambulatory Visit: Payer: Medicare HMO

## 2024-09-09 ENCOUNTER — Ambulatory Visit: Admit: 2024-09-09 | Admitting: Orthopedic Surgery

## 2024-09-09 DIAGNOSIS — M1611 Unilateral primary osteoarthritis, right hip: Secondary | ICD-10-CM

## 2024-09-09 SURGERY — ARTHROPLASTY, HIP, TOTAL, ANTERIOR APPROACH
Anesthesia: Spinal | Site: Hip | Laterality: Right

## 2024-09-10 DIAGNOSIS — E785 Hyperlipidemia, unspecified: Secondary | ICD-10-CM | POA: Diagnosis not present

## 2024-09-10 DIAGNOSIS — M150 Primary generalized (osteo)arthritis: Secondary | ICD-10-CM | POA: Diagnosis not present

## 2024-09-10 DIAGNOSIS — C50911 Malignant neoplasm of unspecified site of right female breast: Secondary | ICD-10-CM | POA: Diagnosis not present

## 2024-09-10 DIAGNOSIS — F411 Generalized anxiety disorder: Secondary | ICD-10-CM | POA: Diagnosis not present

## 2024-09-13 DIAGNOSIS — M26609 Unspecified temporomandibular joint disorder, unspecified side: Secondary | ICD-10-CM | POA: Diagnosis not present

## 2024-09-13 DIAGNOSIS — I10 Essential (primary) hypertension: Secondary | ICD-10-CM | POA: Diagnosis not present

## 2024-09-16 DIAGNOSIS — R6884 Jaw pain: Secondary | ICD-10-CM | POA: Diagnosis not present

## 2024-09-20 DIAGNOSIS — M799 Soft tissue disorder, unspecified: Secondary | ICD-10-CM | POA: Diagnosis not present

## 2024-09-20 DIAGNOSIS — M2569 Stiffness of other specified joint, not elsewhere classified: Secondary | ICD-10-CM | POA: Diagnosis not present

## 2024-09-21 ENCOUNTER — Ambulatory Visit (HOSPITAL_BASED_OUTPATIENT_CLINIC_OR_DEPARTMENT_OTHER)
Admission: RE | Admit: 2024-09-21 | Discharge: 2024-09-21 | Disposition: A | Discharge status: Home / Self Care | Source: Ambulatory Visit | Attending: Internal Medicine | Admitting: Internal Medicine

## 2024-09-21 DIAGNOSIS — Z780 Asymptomatic menopausal state: Secondary | ICD-10-CM | POA: Insufficient documentation

## 2024-09-21 DIAGNOSIS — M8589 Other specified disorders of bone density and structure, multiple sites: Secondary | ICD-10-CM | POA: Diagnosis not present

## 2024-09-21 DIAGNOSIS — M85859 Other specified disorders of bone density and structure, unspecified thigh: Secondary | ICD-10-CM | POA: Diagnosis not present

## 2024-09-22 DIAGNOSIS — I779 Disorder of arteries and arterioles, unspecified: Secondary | ICD-10-CM | POA: Diagnosis not present

## 2024-09-22 DIAGNOSIS — I10 Essential (primary) hypertension: Secondary | ICD-10-CM | POA: Diagnosis not present

## 2024-09-22 DIAGNOSIS — M26609 Unspecified temporomandibular joint disorder, unspecified side: Secondary | ICD-10-CM | POA: Diagnosis not present

## 2024-09-24 DIAGNOSIS — M799 Soft tissue disorder, unspecified: Secondary | ICD-10-CM | POA: Diagnosis not present

## 2024-09-24 DIAGNOSIS — M6281 Muscle weakness (generalized): Secondary | ICD-10-CM | POA: Diagnosis not present

## 2024-09-24 DIAGNOSIS — M2569 Stiffness of other specified joint, not elsewhere classified: Secondary | ICD-10-CM | POA: Diagnosis not present

## 2024-09-28 ENCOUNTER — Other Ambulatory Visit (HOSPITAL_BASED_OUTPATIENT_CLINIC_OR_DEPARTMENT_OTHER)

## 2024-09-28 DIAGNOSIS — M6281 Muscle weakness (generalized): Secondary | ICD-10-CM | POA: Diagnosis not present

## 2024-09-28 DIAGNOSIS — M2569 Stiffness of other specified joint, not elsewhere classified: Secondary | ICD-10-CM | POA: Diagnosis not present

## 2024-09-28 DIAGNOSIS — M799 Soft tissue disorder, unspecified: Secondary | ICD-10-CM | POA: Diagnosis not present

## 2024-09-29 DIAGNOSIS — F4323 Adjustment disorder with mixed anxiety and depressed mood: Secondary | ICD-10-CM | POA: Diagnosis not present

## 2024-09-30 DIAGNOSIS — M6281 Muscle weakness (generalized): Secondary | ICD-10-CM | POA: Diagnosis not present

## 2024-09-30 DIAGNOSIS — M2569 Stiffness of other specified joint, not elsewhere classified: Secondary | ICD-10-CM | POA: Diagnosis not present

## 2024-09-30 DIAGNOSIS — M799 Soft tissue disorder, unspecified: Secondary | ICD-10-CM | POA: Diagnosis not present

## 2024-10-06 ENCOUNTER — Ambulatory Visit: Admitting: Podiatry

## 2024-10-15 ENCOUNTER — Ambulatory Visit: Admitting: Podiatry

## 2024-10-20 ENCOUNTER — Encounter: Payer: Self-pay | Admitting: Podiatry

## 2024-10-20 ENCOUNTER — Ambulatory Visit: Admitting: Podiatry

## 2024-10-20 DIAGNOSIS — Q828 Other specified congenital malformations of skin: Secondary | ICD-10-CM | POA: Diagnosis not present

## 2024-10-20 DIAGNOSIS — I739 Peripheral vascular disease, unspecified: Secondary | ICD-10-CM

## 2024-10-20 DIAGNOSIS — M2569 Stiffness of other specified joint, not elsewhere classified: Secondary | ICD-10-CM | POA: Diagnosis not present

## 2024-10-20 DIAGNOSIS — M799 Soft tissue disorder, unspecified: Secondary | ICD-10-CM | POA: Diagnosis not present

## 2024-10-20 DIAGNOSIS — M6281 Muscle weakness (generalized): Secondary | ICD-10-CM | POA: Diagnosis not present

## 2024-10-22 DIAGNOSIS — M2569 Stiffness of other specified joint, not elsewhere classified: Secondary | ICD-10-CM | POA: Diagnosis not present

## 2024-10-22 DIAGNOSIS — M6281 Muscle weakness (generalized): Secondary | ICD-10-CM | POA: Diagnosis not present

## 2024-10-22 DIAGNOSIS — M799 Soft tissue disorder, unspecified: Secondary | ICD-10-CM | POA: Diagnosis not present

## 2024-10-27 DIAGNOSIS — M799 Soft tissue disorder, unspecified: Secondary | ICD-10-CM | POA: Diagnosis not present

## 2024-10-27 DIAGNOSIS — M2569 Stiffness of other specified joint, not elsewhere classified: Secondary | ICD-10-CM | POA: Diagnosis not present

## 2024-10-27 DIAGNOSIS — M6281 Muscle weakness (generalized): Secondary | ICD-10-CM | POA: Diagnosis not present

## 2024-10-28 NOTE — Progress Notes (Signed)
 Subjective:  Patient ID: Barbara Rangel, female    DOB: 28-May-1946,  MRN: [id number]  Barbara Rangel presents to clinic today for at risk foot care. Patient has h/o PAD and painful porokeratotic lesion(s) of both feet and painful mycotic toenails that limit ambulation. Painful toenails interfere with ambulation. Aggravating factors include wearing enclosed shoe gear. Pain is relieved with periodic professional debridement. Painful porokeratotic lesions are aggravated when weightbearing with and without shoegear. Pain is relieved with periodic professional debridement.  Chief Complaint  Patient presents with   RFC    Rm17 Routine foot care/ Dr. Valery Ripple last visit August 2025   New problem(s): None.   PCP is Ripple Valery, MD.  Allergies[1]  Review of Systems: Negative except as noted in the HPI.  Objective:  There were no vitals filed for this visit. OTTIE NEGLIA is a pleasant 78 y.o. female WD, WN in NAD. AAO x 3.  Vascular Examination: CFT <3 seconds b/l. DP/PT pulses faintly palpable b/l. Pedal edema absent. Skin temperature gradient warm to warm b/l. Digital hair absent. No pain with calf compression. No ischemia or gangrene. No cyanosis or clubbing noted b/l. No edema noted b/l LE.   Neurological Examination: Sensation grossly intact b/l with 10 gram monofilament. Vibratory sensation intact b/l.   Dermatological Examination: Pedal skin warm and supple b/l.   No open wounds. No interdigital macerations.  Toenails 1-5 b/l well maintained with adequate length. No erythema, no edema, no drainage, no fluctuance. Porokeratotic lesion(s) meidal IPJ b/l great toes. No erythema, no edema, no drainage, no fluctuance.  Musculoskeletal Examination: Muscle strength 5/5 to all lower extremity muscle groups bilaterally. HAV with bunion deformity noted b/l LE. Hammertoe(s) 2-5 b/l.  Radiographs: None  Assessment/Plan: 1. Porokeratosis   2. PVD  (peripheral vascular disease)   -Consent given for treatment as described below: -Examined patient. -Patient to continue soft, supportive shoe gear daily. -Porokeratotic lesion(s) medial IPJ of left great toe and medial IPJ of right great toe pared and enucleated with sterile currette without incident. Total number of lesions debrided=2. -As a courtesy, toenails 1-5 b/l were debrided in length and girth with sterile nail nippers and dremel file without incident. -Patient/POA to call should there be question/concern in the interim.   Return in about 3 months (around 01/18/2025).  Delon LITTIE Merlin, DPM      [place] LOCATION: [address] [place] [address]                   Office [phone]   [place] LOCATION: [address] [place], [place] [address] Office [phone]     [1]  Allergies Allergen Reactions   Moxifloxacin Hcl     Other reaction(s): headache   Nitrous Oxide Other (See Comments)    Pt passed out and had confusion. Took a few hours to wake her up.   Avelox [Moxifloxacin Hcl In Nacl] Other (See Comments)    Severe headache    Cephalexin  Other (See Comments)    Caused headache   Other Other (See Comments)    Anesthesia, needs antiemetic med   Quinolones Other (See Comments)    Headaches?   Tramadol Nausea Only and Other (  See Comments)    Nausea and Dizziness, too.   Trimethoprim Other (See Comments)    Rapid heartbeat.   Avelox [Moxifloxacin] Other (See Comments)   Celexa [Citalopram] Other (See Comments)   Prednisone     Cannot take steroids due to her open angle glaucoma   Tizanidine Hcl Other (See Comments)   Citalopram Hydrobromide Other (See Comments)    Pt can't remember what the side effect was.

## 2024-11-02 DIAGNOSIS — M799 Soft tissue disorder, unspecified: Secondary | ICD-10-CM | POA: Diagnosis not present

## 2024-11-02 DIAGNOSIS — M2569 Stiffness of other specified joint, not elsewhere classified: Secondary | ICD-10-CM | POA: Diagnosis not present

## 2024-11-02 DIAGNOSIS — M6281 Muscle weakness (generalized): Secondary | ICD-10-CM | POA: Diagnosis not present

## 2024-11-05 DIAGNOSIS — M2569 Stiffness of other specified joint, not elsewhere classified: Secondary | ICD-10-CM | POA: Diagnosis not present

## 2024-11-05 DIAGNOSIS — M6281 Muscle weakness (generalized): Secondary | ICD-10-CM | POA: Diagnosis not present

## 2024-11-05 DIAGNOSIS — M799 Soft tissue disorder, unspecified: Secondary | ICD-10-CM | POA: Diagnosis not present

## 2024-11-08 DIAGNOSIS — R6884 Jaw pain: Secondary | ICD-10-CM | POA: Diagnosis not present

## 2024-11-22 ENCOUNTER — Encounter: Payer: Self-pay | Admitting: *Deleted

## 2024-11-23 NOTE — Progress Notes (Signed)
 Barbara Rangel                                          MRN: [id number]   11/23/2024   The [facility] Quality Team Specialist reviewed this patient medical record for the purposes of chart review for care gap closure. The following were reviewed: chart review for care gap closure-controlling blood pressure.    [facility] Quality Team

## 2024-11-29 ENCOUNTER — Ambulatory Visit (HOSPITAL_COMMUNITY): Admission: EM | Admit: 2024-11-29 | Discharge: 2024-11-29 | Disposition: A | Discharge status: Home / Self Care

## 2024-11-29 ENCOUNTER — Ambulatory Visit (HOSPITAL_COMMUNITY)

## 2024-11-29 ENCOUNTER — Encounter (HOSPITAL_COMMUNITY): Payer: Self-pay

## 2024-11-29 DIAGNOSIS — J019 Acute sinusitis, unspecified: Secondary | ICD-10-CM | POA: Diagnosis present

## 2024-11-29 DIAGNOSIS — J029 Acute pharyngitis, unspecified: Secondary | ICD-10-CM | POA: Insufficient documentation

## 2024-11-29 DIAGNOSIS — R0989 Other specified symptoms and signs involving the circulatory and respiratory systems: Secondary | ICD-10-CM | POA: Diagnosis present

## 2024-11-29 LAB — POC SOFIA SARS ANTIGEN FIA: SARS Coronavirus 2 Ag: NEGATIVE

## 2024-11-29 LAB — POCT INFLUENZA A/B
Influenza A, POC: NEGATIVE
Influenza B, POC: NEGATIVE

## 2024-11-29 LAB — POCT RAPID STREP A (OFFICE): Rapid Strep A Screen: NEGATIVE

## 2024-11-29 MED ORDER — DOXYCYCLINE HYCLATE 100 MG PO CAPS: 100.0000 mg | ORAL_CAPSULE | Freq: Two times a day (BID) | ORAL | 0 refills | Status: DC

## 2024-11-29 NOTE — ED Triage Notes (Signed)
 Pt c/o nasal/chest congestion, ears stopped up, sinus pressure, and headache for a few month and now getting worse.

## 2024-11-29 NOTE — Discharge Instructions (Addendum)
 Take doxycycline  (antibiotic) sent to pharmacy as directed to treat sinus infection. Tylenol  and/or ibuprofen as needed for sinus pain, fevers/chills, etc.  Purchase Mucinex  over the counter and take this every 12 hours as needed for nasal congestion and cough. Cough medicines as needed. Warm compresses to the cheeks and forehead as needed to help with sinus headaches as well as tylenol  as needed.  Honey water, 2 teaspoons of honey dissolved in 1 cup of warm water, drink every 4-6 hours for throat pain. Salt water gargles, 1/2-1 teaspoon of salt dissolved in 1 cup of warm water, gargle and spit out every 4-6 hours for throat pain.  If you develop any new or worsening symptoms or if your symptoms do not start to improve, please return here or follow-up with your primary care provider. If your symptoms are severe, please go to the emergency room.

## 2024-11-29 NOTE — ED Provider Notes (Signed)
 " [facility]    CSN: [id number] Arrival date & time: 11/29/24  1154      History   Chief Complaint Chief Complaint  Patient presents with   Facial Pain    HPI Barbara Rangel is a 79 y.o. female.   This 79 year old female is being seen for complaints of frontal headache, ear fullness, sinus pressure, rhinorrhea, chest congestion, nausea.  She reports sinus issues every winter and takes Allegra  for the symptoms.  She reports this morning she woke up and felt acutely worse.  She reports feeling as if her throat is swollen.  She denies dizziness, fever, chills.  She denies chest pain, shortness of breath.  She denies abdominal pain, vomiting or diarrhea.  She specifically requests flu and COVID swabs.     Past Medical History:  Diagnosis Date   Arthritis    degenerative arthritis   Breast cancer, right breast (HCC) 04/2004   T2N1 diagnosed June 2005, ER PR + and Her 2 negative, post mastectomy with axillary node dissection (possibly 9 or 11 nodes removed, with possibly 7 or 9 involved) then treatment on NSABP B28 with dose dense adriamycin cytoxan followed by taxol, all of this Rx  in [place]. Adjuvant arimidex  begun Jan 2006. No radiation.    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   Other and unspecified general anesthetics causing adverse effect in therapeutic use    hard to wake up   PONV (postoperative nausea and vomiting)    Syncope 09/15/2012    Patient Active Problem List   Diagnosis Date Noted   Glaucoma, open angle 06/12/2023   Nevus of left thigh 06/12/2023   Atrophic vaginitis 11/07/2020   Disorder of artery 11/07/2020   History of high risk medication  treatment 11/07/2020   Carotid artery stenosis 01/05/2020   Neck pain 03/30/2018   Osteopenia determined by x-ray 08/28/2016   History of right breast cancer 08/28/2016   Malignant neoplasm of right female breast (HCC) 01/23/2016   History of chemotherapy 01/23/2016   Estrogen deficiency 07/29/2015   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/24/2015   Dense breast tissue 01/24/2015   Hx of adenomatous colonic polyps 01/24/2015   Plantar fasciitis, bilateral 01/24/2015   Breast cancer, right breast (HCC) 07/15/2013   Syncope 09/15/2012   Hypokalemia 09/15/2012   Chronic back pain    Chronic urinary tract infection    Hypertension    Arthritis    Synovial cyst of lumbar facet joint 11/01/2011    Past Surgical History:  Procedure Laterality Date   ANKLE SURGERY  2011   left ankle with screws and plates   BREAST LUMPECTOMY     x2   cataract surgery  2005   right eye   COLONOSCOPY     COLONOSCOPY WITH PROPOFOL  N/A 10/11/2014   Procedure: COLONOSCOPY WITH PROPOFOL ;  Surgeon: Gladis MARLA Louder, MD;  Location: [facility];  Service: Endoscopy;  Laterality: N/A;   EYE SURGERY  2004   right eye-detached retina   left breast biopsy  2009   LUMBAR LAMINECTOMY/DECOMPRESSION MICRODISCECTOMY  10/31/2011   Procedure: LUMBAR LAMINECTOMY/DECOMPRESSION MICRODISCECTOMY;  Surgeon: Donaciano JONETTA Sprang;  Location: [facility];  Service: Orthopedics;  Laterality: Right;  L4-5 Decompression with Right Facet Decompression    MASTECTOMY  2005   right  d/t breast cancer;no sticks to right arm LYMPH NODES REMOVED.   RETINAL DETACHMENT SURGERY  2004   right breast biopsy  2005   TUBAL LIGATION  1980    OB History     Gravida  4   Para  4   Term  4   Preterm      AB      Living  4      SAB      IAB      Ectopic      Multiple      Live Births  4            Home Medications    Prior to Admission medications  Medication Sig Start Date End Date Taking? Authorizing Provider  doxycycline   (VIBRAMYCIN ) 100 MG capsule Take 1 capsule (100 mg total) by mouth 2 (two) times daily. 11/29/24  Yes Braidyn Scorsone C, FNP  acetaminophen  (TYLENOL ) 500 MG tablet Take 1,000 mg by mouth at bedtime.    [provider]  amLODipine  (NORVASC ) 5 MG tablet Take 5 mg by mouth at bedtime.     [provider]  AREXVY 120 MCG/0.5ML injection  10/08/22   [provider]  aspirin EC 81 MG tablet Take 81 mg by mouth every morning.     [provider]  atorvastatin (LIPITOR) 20 MG tablet Take 1 tablet by mouth every morning. 04/21/13   [provider]  calcium  citrate-vitamin D  (CITRACAL+D) 315-200 MG-UNIT per tablet Take 2 tablets by mouth 2 (two) times daily.    [provider]  carbamide peroxide (DEBROX) 6.5 % OTIC solution Place 5 drops into both ears 2 (two) times daily. 03/07/21   Gudena, Vinay, MD  Cranberry Extract 250 MG TABS Take 1 capsule by mouth at bedtime.     [provider]  fexofenadine  (ALLEGRA  ODT) 30 MG disintegrating tablet Take 30 mg by mouth daily.    [provider]  guaiFENesin  (MUCINEX ) 600 MG 12 hr tablet Take 1 tablet (600 mg total) by mouth 2 (two) times daily as needed. 03/07/21   Gudena, Vinay, MD  Multiple Vitamins-Minerals (MULTIVITAMINS THER. W/MINERALS) TABS Take 1 tablet by mouth every morning.    [provider]  omeprazole  (PRILOSEC) 20 MG capsule Take 1 capsule (20 mg total) by mouth daily. 08/20/21   Gudena, Vinay, MD  sertraline (ZOLOFT) 25 MG tablet Take 25 mg by mouth daily.    [provider]  timolol (BETIMOL) 0.25 % ophthalmic solution 1-2 drops 2 (two) times daily.    [provider]    Family History Family History  Problem Relation Age of Onset   Colon cancer Mother    Breast cancer Cousin        on dads side   Breast cancer Cousin        on moms side   Anesthesia problems Neg Hx    Hypotension Neg Hx    Malignant hyperthermia Neg Hx    Pseudochol deficiency  Neg Hx     Social History Social History[1]   Allergies   Moxifloxacin hcl, Nitrous oxide, Avelox [moxifloxacin hcl in nacl], Cephalexin , Other, Quinolones, Tramadol, Trimethoprim, Avelox [moxifloxacin], Celexa [citalopram], Prednisone, Tizanidine hcl, and Citalopram hydrobromide   Review of Systems Review of Systems  Constitutional:  Negative for activity change, appetite change, chills and  fever.  HENT:  Positive for congestion, ear pain (fullness), rhinorrhea, sinus pressure, sinus pain and sore throat.   Eyes:  Negative for visual disturbance.  Respiratory:  Positive for cough (denies cough, endorses chest congestion). Negative for shortness of breath.   Cardiovascular:  Negative for chest pain.  Gastrointestinal:  Positive for nausea. Negative for abdominal pain, diarrhea and vomiting.  Musculoskeletal:  Negative for myalgias.  Skin:  Negative for color change and rash.  Neurological:  Positive for headaches. Negative for dizziness.  All other systems reviewed and are negative.    Physical Exam Triage Vital Signs ED Triage Vitals  Encounter Vitals Group     BP 11/29/24 1319 (!) 146/71     Girls Systolic BP Percentile --      Girls Diastolic BP Percentile --      Boys Systolic BP Percentile --      Boys Diastolic BP Percentile --      Pulse Rate 11/29/24 1319 71     Resp 11/29/24 1319 18     Temp 11/29/24 1319 98 F (36.7 C)     Temp Source 11/29/24 1319 Oral     SpO2 11/29/24 1319 97 %     Weight --      Height --      Head Circumference --      Peak Flow --      Pain Score 11/29/24 1318 0     Pain Loc --      Pain Education --      Exclude from Growth Chart --    No data found.  Updated Vital Signs BP (!) 146/71 (BP Location: Left Arm)   Pulse 71   Temp 98 F (36.7 C) (Oral)   Resp 18   SpO2 97%   Visual Acuity Right Eye Distance:   Left Eye Distance:   Bilateral Distance:    Right Eye Near:   Left Eye Near:    Bilateral Near:     Physical  Exam Vitals and nursing note reviewed.  Constitutional:      General: She is not in acute distress.    Appearance: She is well-developed. She is not toxic-appearing.     Comments: Pleasant female appearing stated age found sitting in chair in no acute distress.  HENT:     Head: Normocephalic and atraumatic.     Right Ear: Tympanic membrane and external ear normal.     Left Ear: Tympanic membrane and external ear normal.     Nose: Congestion and rhinorrhea present.     Right Turbinates: Enlarged.     Left Turbinates: Enlarged.     Right Sinus: Frontal sinus tenderness present.     Left Sinus: Frontal sinus tenderness present.     Mouth/Throat:     Lips: Pink.     Mouth: Mucous membranes are moist.     Pharynx: Posterior oropharyngeal erythema and postnasal drip present. No oropharyngeal exudate.  Eyes:     Conjunctiva/sclera: Conjunctivae normal.  Cardiovascular:     Rate and Rhythm: Normal rate and regular rhythm.     Heart sounds: Normal heart sounds. No murmur heard. Pulmonary:     Effort: Pulmonary effort is normal. No respiratory distress.     Breath sounds: Decreased breath sounds present.  Abdominal:     General: Bowel sounds are normal.     Palpations: Abdomen is soft.     Tenderness: There is no abdominal tenderness.  Musculoskeletal:     Cervical back:  Neck supple.  Skin:    General: Skin is warm and dry.     Capillary Refill: Capillary refill takes less than 2 seconds.  Neurological:     Mental Status: She is alert.  Psychiatric:        Mood and Affect: Mood normal.      UC Treatments / Results  Labs (all labs ordered are listed, but only abnormal results are displayed) Labs Reviewed  CULTURE, GROUP A STREP ([facility])  POC SOFIA SARS ANTIGEN FIA  POCT INFLUENZA A/B  POCT RAPID STREP A (OFFICE)    EKG   Radiology DG Chest 2 View Result Date: 11/29/2024 CLINICAL DATA:  Chest congestion. EXAM: CHEST - 2 VIEW COMPARISON:  08/20/2021. FINDINGS: The heart  size and mediastinal contours are within normal limits. Aortic atherosclerosis. Hyperinflation. No overt pulmonary edema, focal consolidation, pleural effusion, or pneumothorax. Right axillary surgical clips. Diffuse osseous demineralization. No acute osseous abnormality. IMPRESSION: 1. No acute cardiopulmonary findings. 2. Hyperinflation. Electronically Signed   By: Harrietta Sherry M.D.   On: 11/29/2024 15:14    Procedures Procedures (including critical care time)  Medications Ordered in UC Medications - No data to display  Initial Impression / Assessment and Plan / UC Course  I have reviewed the triage vital signs and the nursing notes.  Pertinent labs & imaging results that were available during my care of the patient were reviewed by me and considered in my medical decision making (see chart for details).     Vitals and triage reviewed, patient is hemodynamically stable.  Per patient request, COVID and flu swabs obtained and are negative.  Strep swab obtained and is negative.  Throat culture sent.  Chest x-ray obtained and negative for acute cardiopulmonary findings..  Presentation consistent with acute bacterial sinusitis.  Prescription given for doxycycline .  She is advised Tylenol  and/or ibuprofen, Mucinex , warm compresses.  Advised supportive care with salt water gargles, honey water.  Plan of care, follow-up care, return precautions given, no questions at this time.  Caregiver note provided. Final Clinical Impressions(s) / UC Diagnoses   Final diagnoses:  Chest congestion  Sore throat  Acute sinusitis, recurrence not specified, unspecified location     Discharge Instructions      Take doxycycline  (antibiotic) sent to pharmacy as directed to treat sinus infection. Tylenol  and/or ibuprofen as needed for sinus pain, fevers/chills, etc.  Purchase Mucinex  over the counter and take this every 12 hours as needed for nasal congestion and cough. Cough medicines as needed. Warm  compresses to the cheeks and forehead as needed to help with sinus headaches as well as tylenol  as needed.  Honey water, 2 teaspoons of honey dissolved in 1 cup of warm water, drink every 4-6 hours for throat pain. Salt water gargles, 1/2-1 teaspoon of salt dissolved in 1 cup of warm water, gargle and spit out every 4-6 hours for throat pain.  If you develop any new or worsening symptoms or if your symptoms do not start to improve, please return here or follow-up with your primary care provider. If your symptoms are severe, please go to the emergency room.      ED Prescriptions     Medication Sig Dispense Auth. Provider   doxycycline  (VIBRAMYCIN ) 100 MG capsule Take 1 capsule (100 mg total) by mouth 2 (two) times daily. 20 capsule Rylynn Schoneman C, FNP      PDMP not reviewed this encounter.    [1]  Social History Tobacco Use   Smoking status: Never  Passive exposure: Never   Smokeless tobacco: Never  Vaping Use   Vaping status: Never Used  Substance Use Topics   Alcohol use: No   Drug use: No     Lennice Jon BROCKS, FNP 11/29/24 1529  "

## 2024-12-02 ENCOUNTER — Ambulatory Visit (HOSPITAL_COMMUNITY): Payer: Self-pay

## 2024-12-02 LAB — CULTURE, GROUP A STREP (THRC)

## 2024-12-02 NOTE — Patient Instructions (Addendum)
 SURGICAL WAITING ROOM VISITATION Patients having surgery or a procedure may have no more than 2 support people in the waiting area - these visitors may rotate.    Children under the age of 56 will not be allowed to visit due to the increase in respiratory illness  Children under the age of 79 must have an adult with them who is not the patient.  If the patient needs to stay at the hospital during part of their recovery, the visitor guidelines for inpatient rooms apply. Pre-op nurse will coordinate an appropriate time for 1 support person to accompany patient in pre-op.  This support person may not rotate.    Please refer to the [facility] website for the visitor guidelines for Inpatients (after your surgery is over and you are in a regular room).       Your procedure is scheduled on: 12-14-24   Report to [facility] Main Entrance    Report to admitting at 6:10 AM   Call this number if you have problems the morning of surgery [phone]   Do not eat food :After Midnight.   After Midnight you may have the following liquids until 5:30 AM DAY OF SURGERY  Water Non-Citrus Juices (without pulp, NO RED-Apple, White grape, White cranberry) Black Coffee (NO MILK/CREAM OR CREAMERS, sugar ok)  Clear Tea (NO MILK/CREAM OR CREAMERS, sugar ok) regular and decaf                             Plain Jell-O (NO RED)                                           Fruit ices (not with fruit pulp, NO RED)                                     Popsicles (NO RED)                                                               Sports drinks like Gatorade (NO RED)                   The day of surgery:  Drink ONE (1) Pre-Surgery Clear Ensure by 5:30 AM the morning of surgery. Drink in one sitting. Do not sip.  This drink was given to you during your hospital  pre-op appointment visit. Nothing else to drink after completing the Pre-Surgery Clear Ensure.          If you have questions, please contact  your surgeons office.   FOLLOW ANY ADDITIONAL PRE OP INSTRUCTIONS YOU RECEIVED FROM YOUR SURGEON'S OFFICE!!!     Oral Hygiene is also important to reduce your risk of infection.                                    Remember - BRUSH YOUR TEETH THE MORNING OF SURGERY WITH YOUR REGULAR TOOTHPASTE   Do NOT smoke after Midnight  Take these medicines the morning of surgery with A SIP OF WATER:    Acetaminophen    Atorvastatin (Lipitor)   Fexofenadine  (Allegra )   Omeprazole  (Prilosec)   Okay to use eyedrops and nasal spray  Stop all vitamins and herbal supplements 7 days before surgery                              You may not have any metal on your body including hair pins, jewelry, and body piercing             Do not wear make-up, lotions, powders, perfumes or deodorant  Do not wear nail polish including gel and S&S, artificial/acrylic nails, or any other type of covering on natural nails including finger and toenails. If you have artificial nails, gel coating, etc. that needs to be removed by a nail salon please have this removed prior to surgery or surgery may need to be canceled/ delayed if the surgeon/ anesthesia feels like they are unable to be safely monitored.   Do not shave  48 hours prior to surgery.    Do not bring valuables to the hospital. [facility] IS NOT RESPONSIBLE   FOR VALUABLES.   Contacts, dentures or bridgework may not be worn into surgery.   Bring small overnight bag day of surgery.   DO NOT BRING YOUR HOME MEDICATIONS TO THE HOSPITAL. PHARMACY WILL DISPENSE MEDICATIONS LISTED ON YOUR MEDICATION LIST TO YOU DURING YOUR ADMISSION IN THE HOSPITAL!     Special Instructions: Bring a copy of your healthcare power of attorney and living will documents the day of surgery if you haven't scanned them before.              Please read over the following fact sheets you were given: IF YOU HAVE QUESTIONS ABOUT YOUR PRE-OP INSTRUCTIONS PLEASE CALL [phone] [name]  If you  received a COVID test during your pre-op visit  it is requested that you wear a mask when out in public, stay away from anyone that may not be feeling well and notify your surgeon if you develop symptoms. If you test positive for Covid or have been in contact with anyone that has tested positive in the last 10 days please notify you surgeon.   Pre-operative 4 CHG Bath Instructions  DYNA-Hex 4 Chlorhexidine Gluconate 4% Solution Antiseptic 4 fl. oz   You can play a key role in reducing the risk of infection after surgery. Your skin needs to be as free of germs as possible. You can reduce the number of germs on your skin by washing with CHG (chlorhexidine gluconate) soap before surgery. CHG is an antiseptic soap that kills germs and continues to kill germs even after washing.   DO NOT use if you have an allergy to chlorhexidine/CHG or antibacterial soaps. If your skin becomes reddened or irritated, stop using the CHG and notify one of our RNs at   Please shower with the CHG soap starting 4 days before surgery using the following schedule:     Please keep in mind the following:  DO NOT shave, including legs and underarms, starting the day of your first shower.   You may shave your face at any point before/day of surgery.  Place clean sheets on your bed the day you start using CHG soap. Use a clean washcloth (not used since being washed) for each shower. DO NOT sleep with pets once you start using the  CHG.  CHG Shower Instructions:  If you choose to wash your hair and private area, wash first with your normal shampoo/soap.  After you use shampoo/soap, rinse your hair and body thoroughly to remove shampoo/soap residue.  Turn the water OFF and apply about 3 tablespoons (45 ml) of CHG soap to a CLEAN washcloth.  Apply CHG soap ONLY FROM YOUR NECK DOWN TO YOUR TOES (washing for 3-5 minutes)  DO NOT use CHG soap on face, private areas, open wounds, or sores.  Pay special attention to the area where  your surgery is being performed.  If you are having back surgery, having someone wash your back for you may be helpful. Wait 2 minutes after CHG soap is applied, then you may rinse off the CHG soap.  Pat dry with a clean towel  Put on clean clothes/pajamas   If you choose to wear lotion, please use ONLY the CHG-compatible lotions on the back of this paper.     Additional instructions for the day of surgery:  Shower with regular soap the day of surgery DO NOT APPLY any lotions, deodorants, cologne, or perfumes.   Put on clean/comfortable clothes.  Brush your teeth.  Ask your nurse before applying any prescription medications to the skin.   CHG Compatible Lotions   Aveeno Moisturizing lotion  Cetaphil Moisturizing Cream  Cetaphil Moisturizing Lotion  Clairol Herbal Essence Moisturizing Lotion, Dry Skin  Clairol Herbal Essence Moisturizing Lotion, Extra Dry Skin  Clairol Herbal Essence Moisturizing Lotion, Normal Skin  Curel Age Defying Therapeutic Moisturizing Lotion with Alpha Hydroxy  Curel Extreme Care Body Lotion  Curel Soothing Hands Moisturizing Hand Lotion  Curel Therapeutic Moisturizing Cream, Fragrance-Free  Curel Therapeutic Moisturizing Lotion, Fragrance-Free  Curel Therapeutic Moisturizing Lotion, Original Formula  Eucerin Daily Replenishing Lotion  Eucerin Dry Skin Therapy Plus Alpha Hydroxy Crme  Eucerin Dry Skin Therapy Plus Alpha Hydroxy Lotion  Eucerin Original Crme  Eucerin Original Lotion  Eucerin Plus Crme Eucerin Plus Lotion  Eucerin TriLipid Replenishing Lotion  Keri Anti-Bacterial Hand Lotion  Keri Deep Conditioning Original Lotion Dry Skin Formula Softly Scented  Keri Deep Conditioning Original Lotion, Fragrance Free Sensitive Skin Formula  Keri Lotion Fast Absorbing Fragrance Free Sensitive Skin Formula  Keri Lotion Fast Absorbing Softly Scented Dry Skin Formula  Keri Original Lotion  Keri Skin Renewal Lotion Keri Silky Smooth Lotion  Keri Silky  Smooth Sensitive Skin Lotion  Nivea Body Creamy Conditioning Oil  Nivea Body Extra Enriched Lotion  Nivea Body Original Lotion  Nivea Body Sheer Moisturizing Lotion Nivea Crme  Nivea Skin Firming Lotion  NutraDerm 30 Skin Lotion  NutraDerm Skin Lotion  NutraDerm Therapeutic Skin Cream  NutraDerm Therapeutic Skin Lotion  ProShield Protective Hand Cream  Provon moisturizing lotion   PATIENT SIGNATURE_________________________________  NURSE SIGNATURE__________________________________  ________________________________________________________________________    Nasario Exon  An incentive spirometer is a tool that can help keep your lungs clear and active. This tool measures how well you are filling your lungs with each breath. Taking long deep breaths may help reverse or decrease the chance of developing breathing (pulmonary) problems (especially infection) following: A long period of time when you are unable to move or be active. BEFORE THE PROCEDURE  If the spirometer includes an indicator to show your best effort, your nurse or respiratory therapist will set it to a desired goal. If possible, sit up straight or lean slightly forward. Try not to slouch. Hold the incentive spirometer in an upright position. INSTRUCTIONS FOR USE  Sit on  the edge of your bed if possible, or sit up as far as you can in bed or on a chair. Hold the incentive spirometer in an upright position. Breathe out normally. Place the mouthpiece in your mouth and seal your lips tightly around it. Breathe in slowly and as deeply as possible, raising the piston or the ball toward the top of the column. Hold your breath for 3-5 seconds or for as long as possible. Allow the piston or ball to fall to the bottom of the column. Remove the mouthpiece from your mouth and breathe out normally. Rest for a few seconds and repeat Steps 1 through 7 at least 10 times every 1-2 hours when you are awake. Take your time and  take a few normal breaths between deep breaths. The spirometer may include an indicator to show your best effort. Use the indicator as a goal to work toward during each repetition. After each set of 10 deep breaths, practice coughing to be sure your lungs are clear. If you have an incision (the cut made at the time of surgery), support your incision when coughing by placing a pillow or rolled up towels firmly against it. Once you are able to get out of bed, walk around indoors and cough well. You may stop using the incentive spirometer when instructed by your caregiver.  RISKS AND COMPLICATIONS Take your time so you do not get dizzy or light-headed. If you are in pain, you may need to take or ask for pain medication before doing incentive spirometry. It is harder to take a deep breath if you are having pain. AFTER USE Rest and breathe slowly and easily. It can be helpful to keep track of a log of your progress. Your caregiver can provide you with a simple table to help with this. If you are using the spirometer at home, follow these instructions: SEEK MEDICAL CARE IF:  You are having difficultly using the spirometer. You have trouble using the spirometer as often as instructed. Your pain medication is not giving enough relief while using the spirometer. You develop fever of 100.5 F (38.1 C) or higher. SEEK IMMEDIATE MEDICAL CARE IF:  You cough up bloody sputum that had not been present before. You develop fever of 102 F (38.9 C) or greater. You develop worsening pain at or near the incision site. MAKE SURE YOU:  Understand these instructions. Will watch your condition. Will get help right away if you are not doing well or get worse. Document Released: 03/10/2007 Document Revised: 01/20/2012 Document Reviewed: 05/11/2007 Exit[employer] Patient Information 2014 [employer], [place].   ________________________________________________________________________ WHAT IS A BLOOD TRANSFUSION? Blood  Transfusion Information  A transfusion is the replacement of blood or some of its parts. Blood is made up of multiple cells which provide different functions. Red blood cells carry oxygen and are used for blood loss replacement. White blood cells fight against infection. Platelets control bleeding. Plasma helps clot blood. Other blood products are available for specialized needs, such as hemophilia or other clotting disorders. BEFORE THE TRANSFUSION  Who gives blood for transfusions?  Healthy volunteers who are fully evaluated to make sure their blood is safe. This is blood bank blood. Transfusion therapy is the safest it has ever been in the practice of medicine. Before blood is taken from a donor, a complete history is taken to make sure that person has no history of diseases nor engages in risky social behavior (examples are intravenous drug use or sexual activity with multiple partners).  The donor's travel history is screened to minimize risk of transmitting infections, such as malaria. The donated blood is tested for signs of infectious diseases, such as HIV and hepatitis. The blood is then tested to be sure it is compatible with you in order to minimize the chance of a transfusion reaction. If you or a relative donates blood, this is often done in anticipation of surgery and is not appropriate for emergency situations. It takes many days to process the donated blood. RISKS AND COMPLICATIONS Although transfusion therapy is very safe and saves many lives, the main dangers of transfusion include:  Getting an infectious disease. Developing a transfusion reaction. This is an allergic reaction to something in the blood you were given. Every precaution is taken to prevent this. The decision to have a blood transfusion has been considered carefully by your caregiver before blood is given. Blood is not given unless the benefits outweigh the risks. AFTER THE TRANSFUSION Right after receiving a blood  transfusion, you will usually feel much better and more energetic. This is especially true if your red blood cells have gotten low (anemic). The transfusion raises the level of the red blood cells which carry oxygen, and this usually causes an energy increase. The nurse administering the transfusion will monitor you carefully for complications. HOME CARE INSTRUCTIONS  No special instructions are needed after a transfusion. You may find your energy is better. Speak with your caregiver about any limitations on activity for underlying diseases you may have. SEEK MEDICAL CARE IF:  Your condition is not improving after your transfusion. You develop redness or irritation at the intravenous (IV) site. SEEK IMMEDIATE MEDICAL CARE IF:  Any of the following symptoms occur over the next 12 hours: Shaking chills. You have a temperature by mouth above 102 F (38.9 C), not controlled by medicine. Chest, back, or muscle pain. People around you feel you are not acting correctly or are confused. Shortness of breath or difficulty breathing. Dizziness and fainting. You get a rash or develop hives. You have a decrease in urine output. Your urine turns a dark color or changes to pink, red, or brown. Any of the following symptoms occur over the next 10 days: You have a temperature by mouth above 102 F (38.9 C), not controlled by medicine. Shortness of breath. Weakness after normal activity. The white part of the eye turns yellow (jaundice). You have a decrease in the amount of urine or are urinating less often. Your urine turns a dark color or changes to pink, red, or brown. Document Released: 10/25/2000 Document Revised: 01/20/2012 Document Reviewed: 06/13/2008 [facility] Patient Information 2014 [place], [place].  _______________________________________________________________________

## 2024-12-06 ENCOUNTER — Other Ambulatory Visit: Payer: Self-pay

## 2024-12-06 ENCOUNTER — Encounter (HOSPITAL_COMMUNITY): Payer: Self-pay

## 2024-12-06 ENCOUNTER — Encounter (HOSPITAL_COMMUNITY)
Admission: RE | Admit: 2024-12-06 | Discharge: 2024-12-06 | Disposition: A | Discharge status: Home / Self Care | Source: Ambulatory Visit | Attending: Orthopedic Surgery | Admitting: Orthopedic Surgery

## 2024-12-06 HISTORY — DX: Anxiety disorder, unspecified: F41.9

## 2024-12-06 HISTORY — DX: Gastro-esophageal reflux disease without esophagitis: K21.9

## 2024-12-06 HISTORY — DX: Headache, unspecified: R51.9

## 2024-12-06 NOTE — Progress Notes (Addendum)
 Date of COVID positive in last 90 days:  PCP -Valery Ripple,  MD  Cardiologist - Soyla Norton, MD Vascular - Donnice Sender, PA-C  Chest x-ray - N/A EKG - 12-08-24  Epic Stress Test - N/A ECHO - N/A Cardiac Cath - N/A Pacemaker/ICD device last checked:N/A Spinal Cord Stimulator:N/A  Bowel Prep - N/A  Sleep Study - N/A CPAP -   Fasting Blood Sugar - N/A Checks Blood Sugar _____ times a day  Last dose of GLP1 agonist-  N/A GLP1 instructions:  Do not take after     Last dose of SGLT-2 inhibitors-  N/A SGLT-2 instructions:  Do not take after    Blood Thinner Instructions: N/A Last dose:   Time: Aspirin Instructions:  ASA 81 (per patient to hold x5 days) Last Dose:  Activity level:  Can go up a flight of stairs and perform activities of daily living without stopping and without symptoms of chest pain or shortness of breath.  Anesthesia review: bilateral carotid artery stenosis, cardiac eval 2017 for chest pressure.  Poor R wave progression on EKG  Patient denies shortness of breath, fever, cough and chest pain at PAT appointment  (completed over phone)  Patient verbalized understanding of instructions that were given to them at the PAT appointment. Patient was also instructed that they will need to review over the PAT instructions again at home before surgery.

## 2024-12-07 ENCOUNTER — Encounter (HOSPITAL_COMMUNITY)

## 2024-12-08 ENCOUNTER — Encounter (HOSPITAL_COMMUNITY)
Admission: RE | Admit: 2024-12-08 | Discharge: 2024-12-08 | Disposition: A | Discharge status: Home / Self Care | Source: Ambulatory Visit | Attending: Orthopedic Surgery | Admitting: Orthopedic Surgery

## 2024-12-08 VITALS — BP 143/75 | HR 76 | Temp 98.5°F | Resp 12 | Ht 63.5 in | Wt 114.4 lb

## 2024-12-08 DIAGNOSIS — I10 Essential (primary) hypertension: Secondary | ICD-10-CM | POA: Diagnosis not present

## 2024-12-08 DIAGNOSIS — Z01818 Encounter for other preprocedural examination: Secondary | ICD-10-CM | POA: Insufficient documentation

## 2024-12-08 DIAGNOSIS — M1611 Unilateral primary osteoarthritis, right hip: Secondary | ICD-10-CM | POA: Diagnosis not present

## 2024-12-08 LAB — CBC
HCT: 44.2 % (ref 36.0–46.0)
Hemoglobin: 15.1 g/dL — ABNORMAL HIGH (ref 12.0–15.0)
MCH: 34.2 pg — ABNORMAL HIGH (ref 26.0–34.0)
MCHC: 34.2 g/dL (ref 30.0–36.0)
MCV: 100.2 fL — ABNORMAL HIGH (ref 80.0–100.0)
Platelets: 207 10*3/uL (ref 150–400)
RBC: 4.41 MIL/uL (ref 3.87–5.11)
RDW: 14.9 % (ref 11.5–15.5)
WBC: 4.5 10*3/uL (ref 4.0–10.5)
nRBC: 0 % (ref 0.0–0.2)

## 2024-12-08 LAB — BASIC METABOLIC PANEL WITH GFR
Anion gap: 11 (ref 5–15)
BUN: 10 mg/dL (ref 8–23)
CO2: 24 mmol/L (ref 22–32)
Calcium: 9.7 mg/dL (ref 8.9–10.3)
Chloride: 106 mmol/L (ref 98–111)
Creatinine, Ser: 0.61 mg/dL (ref 0.44–1.00)
GFR, Estimated: >60 mL/min
Glucose, Bld: 87 mg/dL (ref 70–99)
Potassium: 3.3 mmol/L — ABNORMAL LOW (ref 3.5–5.1)
Sodium: 141 mmol/L (ref 135–145)

## 2024-12-08 LAB — SURGICAL PCR SCREEN
MRSA, PCR: NEGATIVE
Staphylococcus aureus: NEGATIVE

## 2024-12-08 NOTE — Anesthesia Preprocedure Evaluation (Addendum)
"                                    Anesthesia Evaluation  Patient identified by MRN, date of birth, ID band Patient awake    Reviewed: Allergy & Precautions, NPO status , Patient's Chart, lab work & pertinent test results  History of Anesthesia Complications (+) PONV and history of anesthetic complications  Airway Mallampati: II  TM Distance: >3 FB Neck ROM: Full    Dental no notable dental hx.    Pulmonary neg pulmonary ROS   Pulmonary exam normal        Cardiovascular hypertension, Pt. on medications Normal cardiovascular exam     Neuro/Psych  Headaches  Anxiety        GI/Hepatic Neg liver ROS,GERD  Medicated and Controlled,,  Endo/Other  negative endocrine ROS    Renal/GU negative Renal ROS     Musculoskeletal  (+) Arthritis ,    Abdominal   Peds  Hematology negative hematology ROS (+)   Anesthesia Other Findings   Reproductive/Obstetrics                              Anesthesia Physical Anesthesia Plan  ASA: 2  Anesthesia Plan: Spinal   Post-op Pain Management: Tylenol  PO (pre-op)*   Induction:   PONV Risk Score and Plan: 4 or greater and Treatment may vary due to age or medical condition, Ondansetron , Propofol  infusion and Dexamethasone   Airway Management Planned: Natural Airway and Simple Face Mask  Additional Equipment: None  Intra-op Plan:   Post-operative Plan:   Informed Consent: I have reviewed the patients History and Physical, chart, labs and discussed the procedure including the risks, benefits and alternatives for the proposed anesthesia with the patient or authorized representative who has indicated his/her understanding and acceptance.       Plan Discussed with: CRNA  Anesthesia Plan Comments: (Dx with URI 1/19 and treated w/ abx, will be 2+ weeks out on day of surgery. D/w PAT to have pt come in to be evaluated on day of surgery and would be cancelled if still symptomatic. -finucane)          Anesthesia Quick Evaluation  "

## 2024-12-09 NOTE — Progress Notes (Signed)
 " Case: [id number] Date/Time: 12/14/24 0825   Procedure: ARTHROPLASTY, HIP, TOTAL, ANTERIOR APPROACH (Right: Hip)   Anesthesia type: Spinal   Pre-op diagnosis: Right hip osteoarthritis   Location: [facility] ROOM 10 / [facility]   Surgeons: Ernie Cough, MD       DISCUSSION: Barbara Rangel is a 79 yo female with PMH of HTN, carotid artery stenosis, headaches, GERD, hx of breast cancer s/p R mastectomy and chemo (~2005), anxiety, arthritis, PONV  Hx of carotid artery stenosis, followed by Vascular. In the moderate range by most recent carotid doppler in 08/2024.  Seen in UC for viral URI. COVID, flu, strep tests all negative. CXR done was negative. She followed up with her PCP on 1/21. She was advised to start Mucinex  and started on Doxycycline . At pre op lab appointment on 1/28 patient states all symptoms are resolved.  Discussed with Dr. Merla. Ok to [employer].   VS: Ht 5' 5 (1.651 m)   Wt 54.4 kg   BMI 19.97 kg/m    BP Readings from Last 3 Encounters:  12/08/24 (!) 143/75  11/29/24 (!) 146/71  08/17/24 (!) 149/82   Pulse Readings from Last 3 Encounters:  12/08/24 76  11/29/24 71  08/17/24 71     PROVIDERS: Elliot Charm, MD   LABS: Labs reviewed: Acceptable for surgery. (all labs ordered are listed, but only abnormal results are displayed)  Labs Reviewed - No data to display   CXR 11/29/24:  FINDINGS: The heart size and mediastinal contours are within normal limits. Aortic atherosclerosis. Hyperinflation. No overt pulmonary edema, focal consolidation, pleural effusion, or pneumothorax. Right axillary surgical clips. Diffuse osseous demineralization. No acute osseous abnormality.   IMPRESSION: 1. No acute cardiopulmonary findings. 2. Hyperinflation.  EKG 12/08/2024:  Normal sinus rhythm Borderline criteria for anterior infarct, age undetermined  poor R wave progression No significant change since last tracing  Carotid Duplex  08/17/24:  Summary: Right Carotid: Velocities in the right ICA are consistent with a 40-59%                stenosis. The distal ICA is tortuous. Unable to take BP in right                arm due to history of breast cancer.  Left Carotid: Velocities in the left ICA are consistent with a 60-79% stenosis.               The distal ICA is tortuous.  Vertebrals:  Bilateral vertebral arteries demonstrate antegrade flow. Subclavians: Normal flow hemodynamics were seen in bilateral subclavian              arteries.  Lexicscan ST 03/13/2016:  Nuclear stress EF: 63%. There was no ST segment deviation noted during stress. The study is normal. This is a low risk study. The left ventricular ejection fraction is normal (55-65%).   Normal resting and stress perfusion EF 63%   Past Medical History:  Diagnosis Date   Anxiety    Arthritis    degenerative arthritis   Breast cancer, right breast (HCC) 04/2004   T2N1 diagnosed June 2005, ER PR + and Her 2 negative, post mastectomy with axillary node dissection (possibly 9 or 11 nodes removed, with possibly 7 or 9 involved) then treatment on NSABP B28 with dose dense adriamycin cytoxan followed by taxol, all of this Rx  in [place]. Adjuvant arimidex  begun Jan 2006. No radiation.    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   Other and unspecified general anesthetics causing adverse effect in therapeutic use    hard to wake up   PONV (postoperative nausea and vomiting)    Syncope 09/15/2012    Past Surgical History:  Procedure Laterality Date   ANKLE SURGERY  2011   left ankle  with screws and plates   BREAST LUMPECTOMY     x2   cataract surgery Bilateral 2005   COLONOSCOPY     COLONOSCOPY WITH PROPOFOL  N/A 10/11/2014   Procedure: COLONOSCOPY WITH PROPOFOL ;  Surgeon: Gladis MARLA Louder, MD;  Location: [facility];  Service: Endoscopy;  Laterality: N/A;   EYE SURGERY  2004   right eye-detached retina   left breast biopsy  2009   LUMBAR LAMINECTOMY/DECOMPRESSION MICRODISCECTOMY  10/31/2011   Procedure: LUMBAR LAMINECTOMY/DECOMPRESSION MICRODISCECTOMY;  Surgeon: Donaciano JONETTA Sprang;  Location: [facility];  Service: Orthopedics;  Laterality: Right;  L4-5 Decompression with Right Facet Decompression    MASTECTOMY  2005   right  d/t breast cancer;no sticks to right arm LYMPH NODES REMOVED.   RETINAL DETACHMENT SURGERY Right 2004   right breast biopsy  2005   TUBAL LIGATION  1980    MEDICATIONS:  ACETAMINOPHEN  PO   amLODipine  (NORVASC ) 5 MG tablet   AREXVY 120 MCG/0.5ML injection   aspirin EC 81 MG tablet   atorvastatin (LIPITOR) 20 MG tablet   calcium  citrate-vitamin D  (CITRACAL+D) 315-200 MG-UNIT per tablet   carbamide peroxide (DEBROX) 6.5 % OTIC solution   Cranberry Extract 250 MG TABS   dorzolamide-timolol (COSOPT) 2-0.5 % ophthalmic solution   doxycycline  (VIBRAMYCIN ) 100 MG capsule   Emollient (CERAVE EX)   fexofenadine  (ALLEGRA ) 180 MG tablet   fluticasone (FLONASE) 50 MCG/ACT nasal spray   guaiFENesin  (MUCINEX ) 600 MG 12 hr tablet   latanoprost  (XALATAN ) 0.005 % ophthalmic solution   Multiple Vitamins-Minerals (MULTIVITAMINS THER. W/MINERALS) TABS   omeprazole  (PRILOSEC) 20 MG capsule   sertraline (ZOLOFT) 50 MG tablet   No current facility-administered medications for this encounter.          "

## 2024-12-12 NOTE — H&P (Signed)
 TOTAL HIP ADMISSION H&P  Patient is admitted for right total hip arthroplasty.  Therapy Plans: HEP Disposition: Home with husband Planned DVT Prophylaxis: aspirin  81mg  BID DME needed: walker PCP: Dr. Loman - clearance received Vascular: Dr. Gretta - clearance received (carotid stenosis) TXA: IV Allergies: keflex  - caused headache, prednisone - no steroids, tizanidine, tramadol Anesthesia Concerns: **nausea, not with colonoscopy though BMI: 20.8 Last HgbA1c: Not diabetic  Other: - Staying overnight - hx of open angle glaucoma - no steroids - tylenol , robaxin , oxycodone  (low dose) - stomach sensitivity - avoid NSAIDs - no IV/BP right arm - She has not been hurting much recently, but had significant pain a few months ago   Subjective:  Chief Complaint: Right hip pain  HPI: Barbara Rangel, 79 y.o. female, has a history of pain and functional disability in the right hip due to arthritis and patient has failed non-surgical conservative treatments for greater than 12 weeks to include NSAID's and/or analgesics and activity modification. Onset of symptoms was gradual, starting 2 years ago with gradual worsening course since that time. The patient noted no prior surgeries on the right  hip. Patient currently rates pain in the right hip at 8 out of 10 with activity. Patient has night pain, worsening of pain with activity and weight bearing, and pain with passive range of motion. Patient has evidence of joint space narrowing by imaging studies. This condition presents safety issues increasing the risk of falls. There is no current active infection.  Patient Active Problem List   Diagnosis Date Noted   Glaucoma, open angle 06/12/2023   Nevus of left thigh 06/12/2023   Atrophic vaginitis 11/07/2020   Disorder of artery 11/07/2020   History of high risk medication treatment 11/07/2020   Carotid artery stenosis 01/05/2020   Neck pain 03/30/2018   Osteopenia determined by x-ray  08/28/2016   History of right breast cancer 08/28/2016   Malignant neoplasm of right female breast (HCC) 01/23/2016   History of chemotherapy 01/23/2016   Estrogen deficiency 07/29/2015   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/24/2015   Dense breast tissue 01/24/2015   Hx of adenomatous colonic polyps 01/24/2015   Plantar fasciitis, bilateral 01/24/2015   Breast cancer, right breast (HCC) 07/15/2013   Syncope 09/15/2012   Hypokalemia 09/15/2012   Chronic back pain    Chronic urinary tract infection    Hypertension    Arthritis    Synovial cyst of lumbar facet joint 11/01/2011    Past Medical History:  Diagnosis Date   Anxiety    Arthritis    degenerative arthritis   Breast cancer, right breast (HCC) 04/2004   T2N1 diagnosed June 2005, ER PR + and Her 2 negative, post mastectomy with axillary node dissection (possibly 9 or 11 nodes removed, with possibly 7 or 9 involved) then treatment on NSABP B28 with dose dense adriamycin cytoxan followed by taxol, all of this Rx  in [place]. Adjuvant arimidex  begun Jan 2006. No radiation.    Chronic back pain    right radicular leg pain   Chronic urinary tract infection    takes Macrodantin  and Cranberry daily   Constipation    r/t pain meds and takes Dulcolax nightly   GERD (gastroesophageal reflux disease)    Glaucoma    uses Xalantan nightly   Headache    Tension headaches   Hypertension    takes Amlodipine  daily   Hypokalemia 09/15/2012   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/24/2015   Other and unspecified general anesthetics  causing adverse effect in therapeutic use    hard to wake up   PONV (postoperative nausea and vomiting)    Syncope 09/15/2012    Past Surgical History:  Procedure Laterality Date   ANKLE SURGERY  2011   left ankle with screws and plates   BREAST LUMPECTOMY     x2   cataract surgery Bilateral 2005   COLONOSCOPY     COLONOSCOPY WITH PROPOFOL  N/A 10/11/2014   Procedure: COLONOSCOPY WITH PROPOFOL ;   Surgeon: Gladis MARLA Louder, MD;  [facility];  Service: Endoscopy;  Laterality: N/A;   EYE SURGERY  2004   right eye-detached retina   left breast biopsy  2009   LUMBAR LAMINECTOMY/DECOMPRESSION MICRODISCECTOMY  10/31/2011   Procedure: LUMBAR LAMINECTOMY/DECOMPRESSION MICRODISCECTOMY;  Surgeon: Donaciano JONETTA Sprang;  Location: [facility];  Service: Orthopedics;  Laterality: Right;  L4-5 Decompression with Right Facet Decompression    MASTECTOMY  2005   right  d/t breast cancer;no sticks to right arm LYMPH NODES REMOVED.   RETINAL DETACHMENT SURGERY Right 2004   right breast biopsy  2005   TUBAL LIGATION  1980    Prior to Admission medications  Medication Sig Start Date End Date Taking? Authorizing Provider  ACETAMINOPHEN  PO Take 650 mg by mouth 2 (two) times daily.   Yes [provider]  amLODipine  (NORVASC ) 5 MG tablet Take 5 mg by mouth at bedtime.    Yes [provider]  aspirin  EC 81 MG tablet Take 81 mg by mouth every morning.    Yes [provider]  atorvastatin  (LIPITOR) 20 MG tablet Take 1 tablet by mouth every morning. 04/21/13  Yes [provider]  calcium  citrate-vitamin D  (CITRACAL+D) 315-200 MG-UNIT per tablet Take 2 tablets by mouth 2 (two) times daily.   Yes [provider]  carbamide peroxide (DEBROX) 6.5 % OTIC solution Place 5 drops into both ears 2 (two) times daily. Patient taking differently: Place 5 drops into both ears daily as needed. 03/07/21  Yes Gudena, Vinay, MD  Cranberry Extract 250 MG TABS Take 1 capsule by mouth at bedtime.    Yes [provider]  dorzolamide -timolol  (COSOPT ) 2-0.5 % ophthalmic solution Place 1 drop into both eyes 2 (two) times daily.   Yes [provider]  doxycycline  (VIBRAMYCIN ) 100 MG capsule Take 1 capsule (100 mg total) by mouth 2 (two) times daily. 11/29/24  Yes Kyker, Angela C, FNP  Emollient (CERAVE EX) Apply 1 application  topically daily. Eczema on the elbow   Yes [provider]  fexofenadine  (ALLEGRA ) 180 MG tablet Take 180 mg by mouth daily.   Yes [provider]  fluticasone (FLONASE) 50 MCG/ACT nasal spray Place 2 sprays into both nostrils daily. 12/01/24  Yes [provider]  guaiFENesin  (MUCINEX ) 600 MG 12 hr tablet Take 1 tablet (600 mg total) by mouth 2 (two) times daily as needed. Patient taking differently: Take 600 mg by mouth 2 (two) times daily. 03/07/21  Yes Gudena, Vinay, MD  latanoprost  (XALATAN ) 0.005 % ophthalmic solution Place 1 drop into both eyes at bedtime.   Yes [provider]  Multiple Vitamins-Minerals (MULTIVITAMINS THER. W/MINERALS) TABS Take 1 tablet by mouth every morning.   Yes [provider]  omeprazole  (PRILOSEC) 20 MG capsule Take 1 capsule (20 mg total) by mouth daily. Patient taking differently: Take 20 mg by mouth every other day. 08/20/21  Yes Gudena, Vinay, MD  sertraline  (ZOLOFT ) 50 MG tablet Take 50 mg by mouth at bedtime. 09/26/24  Yes [provider]  AREXVY 120 MCG/0.5ML injection  10/08/22   [provider]    Allergies[1]  Social History   Socioeconomic History   Marital status: Married    Spouse name: Manus   Number of children: 4   Years of education: BS   Highest education level: Not on file  Occupational History   Occupation: Retired  Tobacco Use   Smoking status: Never    Passive exposure: Never   Smokeless tobacco: Never  Vaping Use   Vaping status: Never Used  Substance and Sexual Activity   Alcohol use: No   Drug use: No   Sexual activity: Not Currently  Other Topics Concern   Not on file  Social History Narrative   Pt lives at home with spouse.   Caffeine Use: none   Social Drivers of Health   Tobacco Use: Low Risk (12/06/2024)   Patient History    Smoking Tobacco Use: Never    Smokeless Tobacco Use: Never    Passive Exposure: Never  Financial Resource Strain: Not on file  Food Insecurity: No Food Insecurity (12/19/2023)    Hunger Vital Sign    Worried About Running Out of Food in the Last Year: Never true    Ran Out of Food in the Last Year: Never true  Transportation Needs: No Transportation Needs (12/19/2023)   PRAPARE - Administrator, Civil Service (Medical): No    Lack of Transportation (Non-Medical): No  Physical Activity: Not on file  Stress: Not on file  Social Connections: Not on file  Intimate Partner Violence: Not At Risk (12/19/2023)   Humiliation, Afraid, Rape, and Kick questionnaire    Fear of Current or Ex-Partner: No    Emotionally Abused: No    Physically Abused: No    Sexually Abused: No  Depression (PHQ2-9): Not on file  Alcohol Screen: Not on file  Housing: Low Risk (12/19/2023)   Housing Stability Vital Sign    Unable to Pay for Housing in the Last Year: No    Number of Times Moved in the Last Year: 0    Homeless in the Last Year: No  Utilities: Not At Risk (12/19/2023)   [employer] Utilities    Threatened with loss of utilities: No  Health Literacy: Not on file    Tobacco Use: Low Risk (12/06/2024)   Patient History    Smoking Tobacco Use: Never    Smokeless Tobacco Use: Never    Passive Exposure: Never   Social History   Substance and Sexual Activity  Alcohol Use No    Family History  Problem Relation Age of Onset   Colon cancer Mother    Breast cancer Cousin        on dads side   Breast cancer Cousin        on moms side   Anesthesia problems Neg Hx    Hypotension Neg Hx    Malignant hyperthermia Neg Hx    Pseudochol deficiency Neg Hx     Review Of Systems: Constitutional: Constitutional: no fever, chills, night sweats, or significant weight loss. Cardiovascular: Cardiovascular: no palpitations or chest pain. Respiratory: Respiratory: no cough or shortness of breath and No COPD. Gastrointestinal: Gastrointestinal: no vomiting or nausea. Musculoskeletal: Musculoskeletal: Joint Pain and swelling in Joints. Neurologic: Neurologic: no numbness, tingling, or  difficulty with balance.   Objective:  Physical Exam: Right hip exam: Pain with hip flexion internal rotation to 10 degrees, external rotation of 30 degrees No significant external  rotation contracture noted Maintenance of 5/5 strength  Vital signs in last 24 hours:    Imaging Review Plain radiographs demonstrate severe degenerative joint disease of the right hip. The bone quality appears to be adequate for age and reported activity level.  Assessment/Plan:  End stage arthritis, right hip  The patient history, physical examination, clinical judgement of the provider and imaging studies are consistent with end stage degenerative joint disease of the right hip and total hip arthroplasty is deemed medically necessary. The treatment options including medical management, injection therapy, arthroscopy and arthroplasty were discussed at length. The risks and benefits of total hip arthroplasty were presented and reviewed. The risks due to aseptic loosening, infection, stiffness, dislocation/subluxation, thromboembolic complications and other imponderables were discussed. The patient acknowledged the explanation, agreed to proceed with the plan and consent was signed. Patient is being admitted for inpatient treatment for surgery, pain control, PT, OT, prophylactic antibiotics, VTE prophylaxis, progressive ambulation and ADLs and discharge planning.The patient is planning to be discharged home.  Rosina Calin, PA-C Orthopedic Surgery [facility] [place] [phone]      [1]  Allergies Allergen Reactions   Moxifloxacin Hcl     Other reaction(s): headache   Nitrous Oxide Other (See Comments)    Pt passed out and had confusion. Took a few hours to wake her up.   Avelox [Moxifloxacin Hcl In Nacl] Other (See Comments)    Severe headache    Cephalexin  Other (See Comments)    Caused headache   Other Nausea Only and Other (See Comments)    Anesthesia, needs antiemetic med    Quinolones Other (See Comments)    Headaches?   Tramadol Nausea Only and Other (See Comments)    Nausea and Dizziness, too.   Trimethoprim Other (See Comments)    Rapid heartbeat.   Avelox [Moxifloxacin] Other (See Comments)   Celexa [Citalopram] Other (See Comments)    headache   Meloxicam Other (See Comments)    Not recommended from her doctor   Prednisone     Cannot take steroids due to her open angle glaucoma   Tizanidine Hcl Nausea Only    lethargic   Citalopram Hydrobromide Other (See Comments)    Pt can't remember what the side effect was.

## 2024-12-14 ENCOUNTER — Observation Stay (HOSPITAL_COMMUNITY)
Admission: RE | Admit: 2024-12-14 | Discharge: 2024-12-15 | Disposition: A | Source: Home / Self Care | Attending: Orthopedic Surgery | Admitting: Orthopedic Surgery

## 2024-12-14 ENCOUNTER — Encounter (HOSPITAL_COMMUNITY): Admission: RE | Disposition: A | Payer: Self-pay | Source: Home / Self Care | Attending: Orthopedic Surgery

## 2024-12-14 ENCOUNTER — Ambulatory Visit (HOSPITAL_COMMUNITY)

## 2024-12-14 ENCOUNTER — Ambulatory Visit (HOSPITAL_COMMUNITY): Payer: Self-pay | Admitting: Medical

## 2024-12-14 ENCOUNTER — Observation Stay (HOSPITAL_COMMUNITY)

## 2024-12-14 ENCOUNTER — Encounter (HOSPITAL_COMMUNITY): Payer: Self-pay | Admitting: Orthopedic Surgery

## 2024-12-14 ENCOUNTER — Other Ambulatory Visit: Payer: Self-pay

## 2024-12-14 ENCOUNTER — Ambulatory Visit (HOSPITAL_COMMUNITY): Payer: Self-pay | Admitting: Anesthesiology

## 2024-12-14 DIAGNOSIS — M1611 Unilateral primary osteoarthritis, right hip: Secondary | ICD-10-CM | POA: Diagnosis not present

## 2024-12-14 DIAGNOSIS — Z96641 Presence of right artificial hip joint: Secondary | ICD-10-CM

## 2024-12-14 DIAGNOSIS — Z01818 Encounter for other preprocedural examination: Secondary | ICD-10-CM

## 2024-12-14 LAB — TYPE AND SCREEN
ABO/RH(D): B POS
Antibody Screen: NEGATIVE

## 2024-12-14 LAB — ABO/RH: ABO/RH(D): B POS

## 2024-12-14 MED ORDER — METOCLOPRAMIDE HCL 5 MG PO TABS: 5.0000 mg | ORAL_TABLET | Freq: Three times a day (TID) | ORAL | Status: DC | PRN

## 2024-12-14 MED ORDER — CEFAZOLIN SODIUM-DEXTROSE 2-4 GM/100ML-% IV SOLN
2.0000 g | Freq: Four times a day (QID) | INTRAVENOUS | Status: AC
  Administered 2024-12-14 (×2): 2 g via INTRAVENOUS
  Filled 2024-12-14 (×2): qty 100

## 2024-12-14 MED ORDER — DEXAMETHASONE SOD PHOSPHATE PF 10 MG/ML IJ SOLN: 10.0000 mg | Freq: Once | INTRAMUSCULAR | Status: DC

## 2024-12-14 MED ORDER — CHLORHEXIDINE GLUCONATE 0.12 % MT SOLN
15.0000 mL | Freq: Once | OROMUCOSAL | Status: AC
  Administered 2024-12-14: 15 mL via OROMUCOSAL

## 2024-12-14 MED ORDER — PHENOL 1.4 % MT LIQD: 1.0000 | OROMUCOSAL | Status: DC | PRN

## 2024-12-14 MED ORDER — SODIUM CHLORIDE (PF) 0.9 % IJ SOLN
INTRAMUSCULAR | Status: AC
  Filled 2024-12-14: qty 30

## 2024-12-14 MED ORDER — ORAL CARE MOUTH RINSE: 15.0000 mL | Freq: Once | OROMUCOSAL | Status: AC

## 2024-12-14 MED ORDER — HYDROMORPHONE HCL 1 MG/ML IJ SOLN: 0.5000 mg | INTRAMUSCULAR | Status: DC | PRN

## 2024-12-14 MED ORDER — ACETAMINOPHEN 500 MG PO TABS
ORAL_TABLET | ORAL | Status: AC
  Filled 2024-12-14: qty 2

## 2024-12-14 MED ORDER — ATORVASTATIN CALCIUM 20 MG PO TABS
20.0000 mg | ORAL_TABLET | Freq: Every morning | ORAL | Status: DC
  Administered 2024-12-15: 20 mg via ORAL
  Filled 2024-12-14: qty 1

## 2024-12-14 MED ORDER — SERTRALINE HCL 50 MG PO TABS
50.0000 mg | ORAL_TABLET | Freq: Every day | ORAL | Status: DC
  Administered 2024-12-14: 50 mg via ORAL
  Filled 2024-12-14: qty 1

## 2024-12-14 MED ORDER — TRANEXAMIC ACID-NACL 1000-0.7 MG/100ML-% IV SOLN
INTRAVENOUS | Status: AC
  Filled 2024-12-14: qty 100

## 2024-12-14 MED ORDER — CEFAZOLIN SODIUM-DEXTROSE 2-4 GM/100ML-% IV SOLN
2.0000 g | INTRAVENOUS | Status: AC
  Administered 2024-12-14: 2 g via INTRAVENOUS

## 2024-12-14 MED ORDER — AMLODIPINE BESYLATE 5 MG PO TABS
5.0000 mg | ORAL_TABLET | Freq: Every day | ORAL | Status: DC
  Administered 2024-12-14: 5 mg via ORAL
  Filled 2024-12-14: qty 1

## 2024-12-14 MED ORDER — SENNA 8.6 MG PO TABS
2.0000 | ORAL_TABLET | Freq: Every day | ORAL | Status: DC
  Administered 2024-12-14: 17.2 mg via ORAL
  Filled 2024-12-14: qty 2

## 2024-12-14 MED ORDER — PHENYLEPHRINE 80 MCG/ML (10ML) SYRINGE FOR IV PUSH (FOR BLOOD PRESSURE SUPPORT)
PREFILLED_SYRINGE | INTRAVENOUS | Status: DC | PRN
  Administered 2024-12-14: 80 ug via INTRAVENOUS

## 2024-12-14 MED ORDER — METHOCARBAMOL 500 MG PO TABS
ORAL_TABLET | ORAL | Status: AC
  Filled 2024-12-14: qty 1

## 2024-12-14 MED ORDER — METHOCARBAMOL 1000 MG/10ML IJ SOLN: 500.0000 mg | Freq: Four times a day (QID) | INTRAMUSCULAR | Status: DC | PRN

## 2024-12-14 MED ORDER — TRANEXAMIC ACID-NACL 1000-0.7 MG/100ML-% IV SOLN
1000.0000 mg | INTRAVENOUS | Status: AC
  Administered 2024-12-14: 1000 mg via INTRAVENOUS

## 2024-12-14 MED ORDER — METOCLOPRAMIDE HCL 5 MG/ML IJ SOLN: 5.0000 mg | Freq: Three times a day (TID) | INTRAMUSCULAR | Status: DC | PRN

## 2024-12-14 MED ORDER — DROPERIDOL 2.5 MG/ML IJ SOLN: 0.6250 mg | Freq: Once | INTRAMUSCULAR | Status: DC | PRN

## 2024-12-14 MED ORDER — PROPOFOL 500 MG/50ML IV EMUL
INTRAVENOUS | Status: DC | PRN
  Administered 2024-12-14: 50 ug/kg/min via INTRAVENOUS

## 2024-12-14 MED ORDER — PROPOFOL 10 MG/ML IV BOLUS
INTRAVENOUS | Status: DC | PRN
  Administered 2024-12-14: 20 mg via INTRAVENOUS

## 2024-12-14 MED ORDER — OXYCODONE HCL 5 MG PO TABS
2.5000 mg | ORAL_TABLET | ORAL | Status: DC | PRN
  Filled 2024-12-14: qty 1

## 2024-12-14 MED ORDER — BISACODYL 10 MG RE SUPP: 10.0000 mg | Freq: Every day | RECTAL | Status: DC | PRN

## 2024-12-14 MED ORDER — SODIUM CHLORIDE (PF) 0.9 % IJ SOLN
INTRAMUSCULAR | Status: DC | PRN
  Administered 2024-12-14: 61 mL

## 2024-12-14 MED ORDER — METHOCARBAMOL 500 MG PO TABS
500.0000 mg | ORAL_TABLET | Freq: Four times a day (QID) | ORAL | Status: DC | PRN
  Administered 2024-12-14: 500 mg via ORAL

## 2024-12-14 MED ORDER — KETOROLAC TROMETHAMINE 30 MG/ML IJ SOLN
INTRAMUSCULAR | Status: AC
  Filled 2024-12-14: qty 1

## 2024-12-14 MED ORDER — STERILE WATER FOR IRRIGATION IR SOLN
Status: DC | PRN
  Administered 2024-12-14: 2000 mL

## 2024-12-14 MED ORDER — POVIDONE-IODINE 10 % EX SWAB
2.0000 | Freq: Once | CUTANEOUS | Status: AC
  Administered 2024-12-14: 2 via TOPICAL

## 2024-12-14 MED ORDER — ONDANSETRON HCL 4 MG/2ML IJ SOLN
INTRAMUSCULAR | Status: DC | PRN
  Administered 2024-12-14: 4 mg via INTRAVENOUS

## 2024-12-14 MED ORDER — ONDANSETRON HCL 4 MG PO TABS: 4.0000 mg | ORAL_TABLET | Freq: Four times a day (QID) | ORAL | Status: DC | PRN

## 2024-12-14 MED ORDER — ONDANSETRON HCL 4 MG/2ML IJ SOLN: 4.0000 mg | Freq: Four times a day (QID) | INTRAMUSCULAR | Status: DC | PRN

## 2024-12-14 MED ORDER — TRANEXAMIC ACID-NACL 1000-0.7 MG/100ML-% IV SOLN
1000.0000 mg | Freq: Once | INTRAVENOUS | Status: AC
  Administered 2024-12-14: 1000 mg via INTRAVENOUS
  Filled 2024-12-14: qty 100

## 2024-12-14 MED ORDER — MENTHOL 3 MG MT LOZG: 1.0000 | LOZENGE | OROMUCOSAL | Status: DC | PRN

## 2024-12-14 MED ORDER — MEPIVACAINE HCL (PF) 2 % IJ SOLN
INTRAMUSCULAR | Status: DC | PRN
  Administered 2024-12-14: 3 mL via INTRATHECAL

## 2024-12-14 MED ORDER — DORZOLAMIDE HCL-TIMOLOL MAL 2-0.5 % OP SOLN
1.0000 [drp] | Freq: Two times a day (BID) | OPHTHALMIC | Status: DC
  Administered 2024-12-14 – 2024-12-15 (×2): 1 [drp] via OPHTHALMIC
  Filled 2024-12-14: qty 10

## 2024-12-14 MED ORDER — LIDOCAINE 2% (20 MG/ML) 5 ML SYRINGE
INTRAMUSCULAR | Status: DC | PRN
  Administered 2024-12-14: 40 mg via INTRAVENOUS

## 2024-12-14 MED ORDER — 0.9 % SODIUM CHLORIDE (POUR BTL) OPTIME
TOPICAL | Status: DC | PRN
  Administered 2024-12-14: 1000 mL

## 2024-12-14 MED ORDER — FENTANYL CITRATE (PF) 100 MCG/2ML IJ SOLN
INTRAMUSCULAR | Status: AC
  Filled 2024-12-14: qty 2

## 2024-12-14 MED ORDER — PANTOPRAZOLE SODIUM 40 MG PO TBEC
40.0000 mg | DELAYED_RELEASE_TABLET | Freq: Every day | ORAL | Status: DC
  Administered 2024-12-15: 40 mg via ORAL
  Filled 2024-12-14: qty 1

## 2024-12-14 MED ORDER — OXYCODONE HCL 5 MG PO TABS: 5.0000 mg | ORAL_TABLET | Freq: Once | ORAL | Status: DC | PRN

## 2024-12-14 MED ORDER — DIPHENHYDRAMINE HCL 12.5 MG/5ML PO ELIX: 12.5000 mg | ORAL_SOLUTION | ORAL | Status: DC | PRN

## 2024-12-14 MED ORDER — LACTATED RINGERS IV SOLN: INTRAVENOUS | Status: DC

## 2024-12-14 MED ORDER — FENTANYL CITRATE (PF) 250 MCG/5ML IJ SOLN
INTRAMUSCULAR | Status: DC | PRN
  Administered 2024-12-14 (×2): 50 ug via INTRAVENOUS

## 2024-12-14 MED ORDER — OXYCODONE HCL 5 MG/5ML PO SOLN: 5.0000 mg | Freq: Once | ORAL | Status: DC | PRN

## 2024-12-14 MED ORDER — ACETAMINOPHEN 500 MG PO TABS
1000.0000 mg | ORAL_TABLET | Freq: Four times a day (QID) | ORAL | Status: DC
  Administered 2024-12-14 – 2024-12-15 (×5): 1000 mg via ORAL
  Filled 2024-12-14 (×4): qty 2

## 2024-12-14 MED ORDER — BUPIVACAINE-EPINEPHRINE (PF) 0.25% -1:200000 IJ SOLN
INTRAMUSCULAR | Status: AC
  Filled 2024-12-14: qty 30

## 2024-12-14 MED ORDER — CEFAZOLIN SODIUM-DEXTROSE 2-4 GM/100ML-% IV SOLN
INTRAVENOUS | Status: AC
  Filled 2024-12-14: qty 100

## 2024-12-14 MED ORDER — SODIUM CHLORIDE 0.9 % IV SOLN: INTRAVENOUS | Status: DC

## 2024-12-14 MED ORDER — FENTANYL CITRATE (PF) 50 MCG/ML IJ SOSY: 25.0000 ug | PREFILLED_SYRINGE | INTRAMUSCULAR | Status: DC | PRN

## 2024-12-14 MED ORDER — POLYETHYLENE GLYCOL 3350 17 G PO PACK
17.0000 g | PACK | Freq: Two times a day (BID) | ORAL | Status: DC
  Administered 2024-12-14 – 2024-12-15 (×2): 17 g via ORAL
  Filled 2024-12-14 (×2): qty 1

## 2024-12-14 MED ORDER — ASPIRIN 81 MG PO CHEW
81.0000 mg | CHEWABLE_TABLET | Freq: Two times a day (BID) | ORAL | Status: DC
  Administered 2024-12-14 – 2024-12-15 (×2): 81 mg via ORAL
  Filled 2024-12-14 (×2): qty 1

## 2024-12-14 MED ORDER — ALUM & MAG HYDROXIDE-SIMETH 200-200-20 MG/5ML PO SUSP: 30.0000 mL | ORAL | Status: DC | PRN

## 2024-12-14 NOTE — Evaluation (Signed)
 Physical Therapy Evaluation Patient Details Name: Barbara Rangel MRN: [id number] DOB: 10-08-1946 Today's Date: 12/14/2024  History of Present Illness  79 yo female s/p R THA 12/14/24. Hx of chronic back pain, syncope, breast Ca, osteopenia, lumbar lam 2012  Clinical Impression  On eval POD 0, pt required Min A for mobility. She ambulated ~60 feet with a RW. Minimal pain with activity. Pt tolerated session well. Will continue to progress activity as tolerated. Plan is for d/c home with HEP.        If plan is discharge home, recommend the following: A little help with walking and/or transfers;A little help with bathing/dressing/bathroom;Assistance with cooking/housework;Assist for transportation;Help with stairs or ramp for entrance   Can travel by private vehicle        Equipment Recommendations Rolling walker (2 wheels)  Recommendations for Other Services       Functional Status Assessment Patient has had a recent decline in their functional status and demonstrates the ability to make significant improvements in function in a reasonable and predictable amount of time.     Precautions / Restrictions Precautions Precautions: Fall Restrictions Weight Bearing Restrictions Per Provider Order: No Other Position/Activity Restrictions: WBAT      Mobility  Bed Mobility Overal bed mobility: Needs Assistance Bed Mobility: Supine to Sit     Supine to sit: Contact guard, Used rails, HOB elevated     General bed mobility comments: Increased time. Cues provided as needed.    Transfers Overall transfer level: Needs assistance Equipment used: Rolling walker (2 wheels) Transfers: Sit to/from Stand Sit to Stand: Min assist           General transfer comment: Small amount of assist to rise, steady, control descent. Cues for safety, technique, hand placement    Ambulation/Gait Ambulation/Gait assistance: Contact guard assist Gait Distance (Feet): 60 Feet Assistive device:  Rolling walker (2 wheels)         General Gait Details: Step thru pattern progressing to step through with time and cues. Cues for safety, Rw proximity, step lengths. Tolerated distance. Pt denied dizziness.  Stairs            Wheelchair Mobility     Tilt Bed    Modified Rankin (Stroke Patients Only)       Balance Overall balance assessment: Needs assistance         Standing balance support: During functional activity, Reliant on assistive device for balance Standing balance-Leahy Scale: Poor                               Pertinent Vitals/Pain Pain Assessment Pain Assessment: Faces Faces Pain Scale: Hurts little more Pain Location: R hip Pain Descriptors / Indicators: Discomfort, Sore Pain Intervention(s): Limited activity within patient's tolerance, Monitored during session, Repositioned    Home Living Family/patient expects to be discharged to:: Private residence Living Arrangements: Spouse/significant other Available Help at Discharge: Family Type of Home: House Home Access: Stairs to enter Entrance Stairs-Rails: Right Entrance Stairs-Number of Steps: 4   Home Layout: One level Home Equipment: Cane - single point      Prior Function Prior Level of Function : Independent/Modified Independent                     Extremity/Trunk Assessment   Upper Extremity Assessment Upper Extremity Assessment: Overall WFL for tasks assessed    Lower Extremity Assessment Lower Extremity Assessment: Generalized weakness  Cervical / Trunk Assessment Cervical / Trunk Assessment: Kyphotic  Communication   Communication Communication: No apparent difficulties    Cognition Arousal: Alert Behavior During Therapy: WFL for tasks assessed/performed   PT - Cognitive impairments: No apparent impairments                         Following commands: Intact       Cueing Cueing Techniques: Verbal cues     General Comments       Exercises     Assessment/Plan    PT Assessment Patient needs continued PT services  PT Problem List Decreased strength;Decreased range of motion;Decreased activity tolerance;Decreased mobility;Decreased balance;Decreased knowledge of use of DME;Pain       PT Treatment Interventions DME instruction;Gait training;Stair training;Functional mobility training;Therapeutic activities;Therapeutic exercise;Patient/family education;Balance training    PT Goals (Current goals can be found in the Care Plan section)  Acute Rehab PT Goals Patient Stated Goal: regain plof/independence PT Goal Formulation: With patient Time For Goal Achievement: 12/28/24 Potential to Achieve Goals: Good    Frequency 7X/week     Co-evaluation               AM-PAC PT 6 Clicks Mobility  Outcome Measure Help needed turning from your back to your side while in a flat bed without using bedrails?: A Little Help needed moving from lying on your back to sitting on the side of a flat bed without using bedrails?: A Little Help needed moving to and from a bed to a chair (including a wheelchair)?: A Little Help needed standing up from a chair using your arms (e.g., wheelchair or bedside chair)?: A Little Help needed to walk in hospital room?: A Little Help needed climbing 3-5 steps with a railing? : A Little 6 Click Score: 18    End of Session Equipment Utilized During Treatment: Gait belt Activity Tolerance: Patient tolerated treatment well Patient left: with call bell/phone within reach;with family/visitor present;in bed;with chair alarm set   PT Visit Diagnosis: Other abnormalities of gait and mobility (R26.89);Pain Pain - Right/Left: Right Pain - part of body: Hip    Time: 8290-8267 PT Time Calculation (min) (ACUTE ONLY): 23 min   Charges:   PT Evaluation $PT Eval Low Complexity: 1 Low PT Treatments $Gait Training: 8-22 mins PT General Charges $$ ACUTE PT VISIT: 1 Visit             Dannial SQUIBB, PT Acute Rehabilitation  Office: [phone]

## 2024-12-14 NOTE — Plan of Care (Signed)
" °  Problem: Education: Goal: Knowledge of the prescribed therapeutic regimen will improve Outcome: Progressing   Problem: Bowel/Gastric: Goal: Gastrointestinal status for postoperative course will improve Outcome: Progressing   Problem: Cardiac: Goal: Ability to maintain an adequate cardiac output Outcome: Progressing Goal: Will show no evidence of cardiac arrhythmias Outcome: Progressing   Problem: Nutritional: Goal: Will attain and maintain optimal nutritional status Outcome: Progressing   Problem: Neurological: Goal: Will regain or maintain usual level of consciousness Outcome: Progressing   Problem: Clinical Measurements: Goal: Ability to maintain clinical measurements within normal limits Outcome: Progressing Goal: Postoperative complications will be avoided or minimized Outcome: Progressing   Problem: Respiratory: Goal: Will regain and/or maintain adequate ventilation Outcome: Progressing Goal: Respiratory status will improve Outcome: Progressing   Problem: Skin Integrity: Goal: Demonstrates signs of wound healing without infection Outcome: Progressing   Problem: Urinary Elimination: Goal: Will remain free from infection Outcome: Progressing Goal: Ability to achieve and maintain adequate urine output Outcome: Progressing   Problem: Education: Goal: Knowledge of General Education information will improve Description: Including pain rating scale, medication(s)/side effects and non-pharmacologic comfort measures Outcome: Progressing   Problem: Health Behavior/Discharge Planning: Goal: Ability to manage health-related needs will improve Outcome: Progressing   Problem: Clinical Measurements: Goal: Ability to maintain clinical measurements within normal limits will improve Outcome: Progressing Goal: Will remain free from infection Outcome: Progressing Goal: Diagnostic test results will improve Outcome: Progressing Goal: Respiratory complications will  improve Outcome: Progressing Goal: Cardiovascular complication will be avoided Outcome: Progressing   Problem: Activity: Goal: Risk for activity intolerance will decrease Outcome: Progressing   Problem: Nutrition: Goal: Adequate nutrition will be maintained Outcome: Progressing   Problem: Coping: Goal: Level of anxiety will decrease Outcome: Progressing   Problem: Elimination: Goal: Will not experience complications related to bowel motility Outcome: Progressing Goal: Will not experience complications related to urinary retention Outcome: Progressing   Problem: Pain Managment: Goal: General experience of comfort will improve and/or be controlled Outcome: Progressing   Problem: Safety: Goal: Ability to remain free from injury will improve Outcome: Progressing   Problem: Skin Integrity: Goal: Risk for impaired skin integrity will decrease Outcome: Progressing   Problem: Education: Goal: Knowledge of the prescribed therapeutic regimen will improve Outcome: Progressing Goal: Understanding of discharge needs will improve Outcome: Progressing Goal: Individualized Educational Video(s) Outcome: Progressing   Problem: Activity: Goal: Ability to avoid complications of mobility impairment will improve Outcome: Progressing Goal: Ability to tolerate increased activity will improve Outcome: Progressing   Problem: Clinical Measurements: Goal: Postoperative complications will be avoided or minimized Outcome: Progressing   Problem: Pain Management: Goal: Pain level will decrease with appropriate interventions Outcome: Progressing   Problem: Skin Integrity: Goal: Will show signs of wound healing Outcome: Progressing   "

## 2024-12-14 NOTE — Transfer of Care (Signed)
 Immediate Anesthesia Transfer of Care Note  Patient: Barbara Rangel  Procedure(s) Performed: ARTHROPLASTY, HIP, TOTAL, ANTERIOR APPROACH (Right: Hip)  Patient Location: PACU  Anesthesia Type:MAC and Regional  Level of Consciousness: drowsy and patient cooperative  Airway & Oxygen Therapy: Patient Spontanous Breathing and Patient connected to face mask oxygen  Post-op Assessment: Report given to RN and Post -op Vital signs reviewed and stable  Post vital signs: Reviewed and stable  Last Vitals:  Vitals Value Taken Time  BP 121/60 12/14/24 09:49  Temp    Pulse 65 12/14/24 09:51  Resp 13 12/14/24 09:51  SpO2 100 % 12/14/24 09:51  Vitals shown include unfiled device data.  Last Pain:  Vitals:   12/14/24 0617  TempSrc: Oral         Complications: No notable events documented.

## 2024-12-14 NOTE — Anesthesia Postprocedure Evaluation (Signed)
"   Anesthesia Post Note  Patient: Barbara Rangel  Procedure(s) Performed: ARTHROPLASTY, HIP, TOTAL, ANTERIOR APPROACH (Right: Hip)     Patient location during evaluation: PACU Anesthesia Type: Spinal Level of consciousness: awake and alert Pain management: pain level controlled Vital Signs Assessment: post-procedure vital signs reviewed and stable Respiratory status: spontaneous breathing, nonlabored ventilation and respiratory function stable Cardiovascular status: blood pressure returned to baseline Postop Assessment: no apparent nausea or vomiting, spinal receding, no headache and no backache Anesthetic complications: no   No notable events documented.  Last Vitals:  Vitals:   12/14/24 1215 12/14/24 1300  BP: 110/64 (!) 103/53  Pulse: 81 83  Resp: 13 12  Temp: (!) 36.3 C   SpO2: 100% 100%    Last Pain:  Vitals:   12/14/24 1300  TempSrc:   PainSc: 3                  Vertell Row      "

## 2024-12-14 NOTE — Anesthesia Procedure Notes (Signed)
 Spinal  Patient location during procedure: OR Start time: 12/14/2024 8:28 AM End time: 12/14/2024 8:33 AM Reason for block: surgical anesthesia  Staffing Performed: anesthesiologist  Authorized by: Paul Lamarr BRAVO, MD   Performed by: Paul Lamarr BRAVO, MD  Preanesthetic Checklist Completed: patient identified, IV checked, risks and benefits discussed, surgical consent, monitors and equipment checked, pre-op evaluation and timeout performed Spinal Block Patient position: sitting Prep: DuraPrep and site prepped and draped Patient monitoring: continuous pulse ox, blood pressure and heart rate Approach: midline Location: L3-4 Injection technique: single-shot Needle Needle type: Pencan  Needle gauge: 24 G Needle length: 9 cm Assessment Events: CSF return  Additional Notes Risks, benefits, and alternative discussed. Patient gave consent to procedure. Prepped and draped in sitting position. Patient sedated but responsive to voice. Clear CSF obtained on third attempt (first and second attempts midline and right paramedian L2-3, third attempt right paramedian L3-4). Positive terminal aspiration. No pain or paraesthesias with injection. Patient tolerated procedure well. Vital signs stable. LANEY Paul, MD

## 2024-12-14 NOTE — Interval H&P Note (Signed)
 History and Physical Interval Note:  12/14/2024 6:59 AM  Barbara Rangel  has presented today for surgery, with the diagnosis of Right hip osteoarthritis.  The various methods of treatment have been discussed with the patient and family. After consideration of risks, benefits and other options for treatment, the patient has consented to  Procedures: ARTHROPLASTY, HIP, TOTAL, ANTERIOR APPROACH (Right) as a surgical intervention.  The patient's history has been reviewed, patient examined, no change in status, stable for surgery.  I have reviewed the patient's chart and labs.  Questions were answered to the patient's satisfaction.     Barbara Rangel

## 2024-12-15 ENCOUNTER — Other Ambulatory Visit (HOSPITAL_COMMUNITY): Payer: Self-pay

## 2024-12-15 ENCOUNTER — Encounter (HOSPITAL_COMMUNITY): Payer: Self-pay | Admitting: Orthopedic Surgery

## 2024-12-15 LAB — CBC
HCT: 36.5 % (ref 36.0–46.0)
Hemoglobin: 12.3 g/dL (ref 12.0–15.0)
MCH: 33.7 pg (ref 26.0–34.0)
MCHC: 33.7 g/dL (ref 30.0–36.0)
MCV: 100 fL (ref 80.0–100.0)
Platelets: 147 10*3/uL — ABNORMAL LOW (ref 150–400)
RBC: 3.65 MIL/uL — ABNORMAL LOW (ref 3.87–5.11)
RDW: 14.8 % (ref 11.5–15.5)
WBC: 5 10*3/uL (ref 4.0–10.5)
nRBC: 0 % (ref 0.0–0.2)

## 2024-12-15 LAB — BASIC METABOLIC PANEL WITH GFR
Anion gap: 8 (ref 5–15)
BUN: <5 mg/dL — ABNORMAL LOW (ref 8–23)
CO2: 23 mmol/L (ref 22–32)
Calcium: 9.3 mg/dL (ref 8.9–10.3)
Chloride: 110 mmol/L (ref 98–111)
Creatinine, Ser: 0.52 mg/dL (ref 0.44–1.00)
GFR, Estimated: >60 mL/min
Glucose, Bld: 94 mg/dL (ref 70–99)
Potassium: 3.1 mmol/L — ABNORMAL LOW (ref 3.5–5.1)
Sodium: 142 mmol/L (ref 135–145)

## 2024-12-15 MED ORDER — ASPIRIN 81 MG PO CHEW
81.0000 mg | CHEWABLE_TABLET | Freq: Two times a day (BID) | ORAL | 0 refills | Status: AC
  Filled 2024-12-15: qty 56, 28d supply, fill #0

## 2024-12-15 MED ORDER — OXYCODONE HCL 5 MG PO TABS
2.5000 mg | ORAL_TABLET | ORAL | 0 refills | Status: AC | PRN
  Filled 2024-12-15: qty 10, 2d supply, fill #0

## 2024-12-15 MED ORDER — METHOCARBAMOL 500 MG PO TABS
500.0000 mg | ORAL_TABLET | Freq: Four times a day (QID) | ORAL | 0 refills | Status: AC | PRN
  Filled 2024-12-15: qty 40, 10d supply, fill #0

## 2024-12-15 MED ORDER — POTASSIUM CHLORIDE CRYS ER 20 MEQ PO TBCR
40.0000 meq | EXTENDED_RELEASE_TABLET | Freq: Once | ORAL | Status: AC
  Administered 2024-12-15: 40 meq via ORAL
  Filled 2024-12-15: qty 2

## 2024-12-15 MED ORDER — POLYETHYLENE GLYCOL 3350 17 GM/SCOOP PO POWD
17.0000 g | Freq: Two times a day (BID) | ORAL | 0 refills | Status: AC
  Filled 2024-12-15: qty 238, 7d supply, fill #0

## 2024-12-15 NOTE — Progress Notes (Signed)
 Physical Therapy Treatment Patient Details Name: Barbara Rangel MRN: [id number] DOB: 1946-08-13 Today's Date: 12/15/2024   History of Present Illness 79 yo female s/p R THA 12/14/24. Hx of chronic back pain, syncope, breast Ca, osteopenia, lumbar lam 2012    PT Comments  Pt reports feeling well on today. Moderate pain with activity. Reviewed/practiced exercises, gait training,and stair training. Issued and reviewed HEP for pt to follow. Encouraged pt to ambulate often at home, as tolerated. PT education completed.    If plan is discharge home, recommend the following: A little help with walking and/or transfers;A little help with bathing/dressing/bathroom;Assistance with cooking/housework;Assist for transportation;Help with stairs or ramp for entrance   Can travel by private vehicle        Equipment Recommendations       Recommendations for Other Services       Precautions / Restrictions Precautions Precautions: Fall Restrictions Weight Bearing Restrictions Per Provider Order: No RLE Weight Bearing Per Provider Order: Weight bearing as tolerated     Mobility  Bed Mobility Overal bed mobility: Needs Assistance Bed Mobility: Supine to Sit     Supine to sit: Supervision     General bed mobility comments: Increased time. Cues provided as needed.    Transfers Overall transfer level: Needs assistance Equipment used: Rolling walker (2 wheels) Transfers: Sit to/from Stand Sit to Stand: Supervision           General transfer comment: Cues for safety, technique, hand placement    Ambulation/Gait Ambulation/Gait assistance: Supervision Gait Distance (Feet): 200 Feet Assistive device: Rolling walker (2 wheels) Gait Pattern/deviations: Step-through pattern       General Gait Details: step through pattern with time and cues. Cues for safety, Rw proximity, step lengths. Tolerated distance well. Pt denied dizziness.   Stairs Stairs: Yes Stairs assistance: Contact  guard assist Stair Management: One rail Right, Sideways, Step to pattern Number of Stairs: 5 General stair comments: up and over portable stairs x 2. cues for safety, technqiue, sequencing.   Wheelchair Mobility     Tilt Bed    Modified Rankin (Stroke Patients Only)       Balance Overall balance assessment: Needs assistance         Standing balance support: During functional activity Standing balance-Leahy Scale: Fair                              Hotel Manager: No apparent difficulties  Cognition Arousal: Alert Behavior During Therapy: WFL for tasks assessed/performed   PT - Cognitive impairments: No apparent impairments                         Following commands: Intact      Cueing Cueing Techniques: Verbal cues  Exercises Total Joint Exercises Ankle Circles/Pumps: AROM, Both, 10 reps Quad Sets: AROM, Both, 10 reps Heel Slides: AAROM, Right, 10 reps (used gait belt) Hip ABduction/ADduction: AAROM, Right, 10 reps, Supine, Standing (used gait belt in bed) Knee Flexion: AROM, Right, Standing, 10 reps Marching in Standing: AROM, 10 reps, Standing General Exercises - Lower Extremity Heel Raises: AROM, 10 reps, Standing    General Comments        Pertinent Vitals/Pain Pain Assessment Pain Assessment: Faces Faces Pain Scale: Hurts even more Pain Location: R hip Pain Descriptors / Indicators: Discomfort, Sore Pain Intervention(s): Monitored during session, Repositioned, Ice applied    Home Living  Prior Function            PT Goals (current goals can now be found in the care plan section) Progress towards PT goals: Progressing toward goals    Frequency    7X/week      PT Plan      Co-evaluation              AM-PAC PT 6 Clicks Mobility   Outcome Measure  Help needed turning from your back to your side while in a flat bed without using bedrails?: A  Little Help needed moving from lying on your back to sitting on the side of a flat bed without using bedrails?: A Little Help needed moving to and from a bed to a chair (including a wheelchair)?: A Little Help needed standing up from a chair using your arms (e.g., wheelchair or bedside chair)?: A Little Help needed to walk in hospital room?: A Little Help needed climbing 3-5 steps with a railing? : A Little 6 Click Score: 18    End of Session Equipment Utilized During Treatment: Gait belt Activity Tolerance: Patient tolerated treatment well Patient left: in chair;with call bell/phone within reach   PT Visit Diagnosis: Other abnormalities of gait and mobility (R26.89);Pain Pain - Right/Left: Right Pain - part of body: Hip     Time: 1022-1106 PT Time Calculation (min) (ACUTE ONLY): 44 min  Charges:    $Gait Training: 23-37 mins $Therapeutic Exercise: 8-22 mins PT General Charges $$ ACUTE PT VISIT: 1 Visit                         Dannial SQUIBB, PT Acute Rehabilitation  Office: [phone]

## 2024-12-15 NOTE — Care Management Obs Status (Signed)
 MEDICARE OBSERVATION STATUS NOTIFICATION   Patient Details  Name: Barbara Rangel MRN: [id number] Date of Birth: 10-Sep-1946   Medicare Observation Status Notification Given:       Alfonse JONELLE Rex, RN 12/15/2024, 9:42 AM

## 2024-12-15 NOTE — Progress Notes (Signed)
 Discharge meds in a secure bag delivered to patient by this RN

## 2024-12-15 NOTE — TOC Transition Note (Signed)
 Transition of Care [facility]) - Discharge Note   Patient Details  Name: Barbara Rangel MRN: [id number] Date of Birth: 1945-12-08  Transition of [facility]) CM/SW Contact:  Alfonse JONELLE Rex, RN Phone Number: 12/15/2024, 10:30 AM   Clinical Narrative:   Met with patient at  bedside to review dc therapy and home equipment needs, pt confirmed HEP, RW delivered to bedside by [employer]. MOON completed. No CM needs.     Final next level of care: Home/Self Care Barriers to Discharge: No Barriers Identified   Patient Goals and CMS Choice Patient states their goals for this hospitalization and ongoing recovery are:: return home          Discharge Placement                       Discharge Plan and Services Additional resources added to the After Visit Summary for                  DME Arranged: Walker rolling DME Agency: [employer]                  Social Drivers of Health (SDOH) Interventions SDOH Screenings   Food Insecurity: No Food Insecurity (12/14/2024)  Housing: Low Risk (12/14/2024)  Transportation Needs: No Transportation Needs (12/14/2024)  Utilities: Not At Risk (12/14/2024)  Social Connections: Socially Integrated (12/14/2024)  Tobacco Use: Low Risk (12/14/2024)     Readmission Risk Interventions     No data to display

## 2024-12-15 NOTE — Progress Notes (Signed)
 "  Subjective: 1 Day Post-Op Procedures (LRB): ARTHROPLASTY, HIP, TOTAL, ANTERIOR APPROACH (Right) Patient reports pain as mild.   Patient seen in rounds for Dr. Ernie. Patient is resting in bed on exam this morning. No acute events overnight. Foley catheter removed. Patient ambulated 60 feet with PT yesterday. We will start therapy today.   Objective: Vital signs in last 24 hours: Temp:  [95.9 F (35.5 C)-98.4 F (36.9 C)] 98 F (36.7 C) (02/04 0514) Pulse Rate:  [60-86] 81 (02/04 0514) Resp:  [8-17] 15 (02/04 0514) BP: (103-139)/(53-82) 139/82 (02/04 0514) SpO2:  [91 %-100 %] 100 % (02/04 0514) Weight:  [51.9 kg] 51.9 kg (02/03 1600)  Intake/Output from previous day:  Intake/Output Summary (Last 24 hours) at 12/15/2024 0817 Last data filed at 12/15/2024 0600 Gross per 24 hour  Intake 2093.04 ml  Output 2700 ml  Net -606.96 ml     Intake/Output this shift: No intake/output data recorded.  Labs: Recent Labs    12/15/24 0346  HGB 12.3   Recent Labs    12/15/24 0346  WBC 5.0  RBC 3.65*  HCT 36.5  PLT 147*   Recent Labs    12/15/24 0346  NA 142  K 3.1*  CL 110  CO2 23  BUN <5*  CREATININE 0.52  GLUCOSE 94  CALCIUM  9.3   No results for input(s): LABPT, INR in the last 72 hours.  Exam: General - Patient is Alert and Oriented Extremity - Neurologically intact Sensation intact distally Intact pulses distally Dorsiflexion/Plantar flexion intact Dressing - dressing C/D/I Motor Function - intact, moving foot and toes well on exam.   Past Medical History:  Diagnosis Date   Anxiety    Arthritis    degenerative arthritis   Breast cancer, right breast (HCC) 04/2004   T2N1 diagnosed June 2005, ER PR + and Her 2 negative, post mastectomy with axillary node dissection (possibly 9 or 11 nodes removed, with possibly 7 or 9 involved) then treatment on NSABP B28 with dose dense adriamycin cytoxan followed by taxol, all of this Rx  in [place]. Adjuvant  arimidex  begun Jan 2006. No radiation.    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   Weight as of this encounter: 51.9 kg. Advance diet Up with therapy D/C IV fluids  DVT Prophylaxis - Aspirin  Weight bearing as tolerated.  She has not taken any pain medication yet - prefers I sent a minimal amount  Hgb stable at 12.3 this AM K 3.1 - will give dose of kdur today  Plan is to go Home after hospital stay. Plan for discharge  today after meeting goals with therapy. Follow up in the office in 2 weeks.   Rosina Calin, PA-C Orthopedic Surgery [phone] 12/15/2024, 8:17 AM  "

## 2025-02-02 ENCOUNTER — Ambulatory Visit: Admitting: Podiatry

## 2025-03-09 ENCOUNTER — Ambulatory Visit (HOSPITAL_BASED_OUTPATIENT_CLINIC_OR_DEPARTMENT_OTHER)

## 2025-03-14 ENCOUNTER — Ambulatory Visit: Admitting: Hematology and Oncology
